# Patient Record
Sex: Male | Born: 1955 | Race: White | Hispanic: No | Marital: Married | Smoking: Former smoker
Health system: Southern US, Community
[De-identification: ages and names within clinical notes are randomized; demographics above are authoritative.]

## PROBLEM LIST (undated history)

## (undated) DIAGNOSIS — N200 Calculus of kidney: Secondary | ICD-10-CM

## (undated) DIAGNOSIS — I10 Essential (primary) hypertension: Secondary | ICD-10-CM

## (undated) DIAGNOSIS — J45909 Unspecified asthma, uncomplicated: Secondary | ICD-10-CM

## (undated) DIAGNOSIS — G4730 Sleep apnea, unspecified: Secondary | ICD-10-CM

## (undated) DIAGNOSIS — L570 Actinic keratosis: Secondary | ICD-10-CM

## (undated) DIAGNOSIS — K8020 Calculus of gallbladder without cholecystitis without obstruction: Secondary | ICD-10-CM

## (undated) DIAGNOSIS — Z87891 Personal history of nicotine dependence: Principal | ICD-10-CM

## (undated) DIAGNOSIS — I2510 Atherosclerotic heart disease of native coronary artery without angina pectoris: Secondary | ICD-10-CM

## (undated) DIAGNOSIS — I700 Atherosclerosis of aorta: Secondary | ICD-10-CM

## (undated) DIAGNOSIS — M1990 Unspecified osteoarthritis, unspecified site: Secondary | ICD-10-CM

## (undated) DIAGNOSIS — J449 Chronic obstructive pulmonary disease, unspecified: Secondary | ICD-10-CM

## (undated) DIAGNOSIS — E785 Hyperlipidemia, unspecified: Secondary | ICD-10-CM

## (undated) DIAGNOSIS — J439 Emphysema, unspecified: Secondary | ICD-10-CM

## (undated) DIAGNOSIS — I5022 Chronic systolic (congestive) heart failure: Secondary | ICD-10-CM

## (undated) DIAGNOSIS — N1831 Chronic kidney disease, stage 3a: Secondary | ICD-10-CM

## (undated) HISTORY — PX: COLON SURGERY: SHX602

## (undated) HISTORY — DX: Actinic keratosis: L57.0

## (undated) HISTORY — DX: Personal history of nicotine dependence: Z87.891

---

## 2007-05-23 ENCOUNTER — Ambulatory Visit: Payer: Self-pay | Admitting: Internal Medicine

## 2007-05-24 ENCOUNTER — Ambulatory Visit: Payer: Self-pay | Admitting: Internal Medicine

## 2009-02-15 ENCOUNTER — Emergency Department: Payer: Self-pay | Admitting: Emergency Medicine

## 2009-05-17 ENCOUNTER — Ambulatory Visit: Payer: Self-pay | Admitting: Gastroenterology

## 2009-08-06 ENCOUNTER — Inpatient Hospital Stay: Payer: Self-pay | Admitting: Surgery

## 2010-06-22 ENCOUNTER — Ambulatory Visit: Payer: Self-pay | Admitting: Internal Medicine

## 2010-07-06 ENCOUNTER — Ambulatory Visit: Payer: Self-pay | Admitting: Internal Medicine

## 2011-09-26 ENCOUNTER — Ambulatory Visit: Payer: Self-pay | Admitting: Gastroenterology

## 2014-11-09 ENCOUNTER — Ambulatory Visit: Admit: 2014-11-09 | Disposition: A | Discharge status: Home / Self Care | Payer: Self-pay | Admitting: Gastroenterology

## 2014-11-16 LAB — SURGICAL PATHOLOGY

## 2015-09-21 ENCOUNTER — Encounter: Payer: Self-pay | Admitting: Family Medicine

## 2015-09-21 ENCOUNTER — Other Ambulatory Visit: Payer: Self-pay | Admitting: Family Medicine

## 2015-09-21 DIAGNOSIS — Z87891 Personal history of nicotine dependence: Secondary | ICD-10-CM

## 2015-09-21 HISTORY — DX: Personal history of nicotine dependence: Z87.891

## 2015-09-24 ENCOUNTER — Inpatient Hospital Stay: Payer: 59 | Attending: Family Medicine | Admitting: Family Medicine

## 2015-09-24 ENCOUNTER — Ambulatory Visit
Admission: RE | Admit: 2015-09-24 | Discharge: 2015-09-24 | Disposition: A | Discharge status: Home / Self Care | Payer: Commercial Managed Care - HMO | Source: Ambulatory Visit | Attending: Family Medicine | Admitting: Family Medicine

## 2015-09-24 ENCOUNTER — Encounter: Payer: Self-pay | Admitting: Family Medicine

## 2015-09-24 DIAGNOSIS — Z87891 Personal history of nicotine dependence: Secondary | ICD-10-CM | POA: Diagnosis not present

## 2015-09-24 DIAGNOSIS — N200 Calculus of kidney: Secondary | ICD-10-CM | POA: Diagnosis not present

## 2015-09-24 DIAGNOSIS — Z122 Encounter for screening for malignant neoplasm of respiratory organs: Secondary | ICD-10-CM | POA: Diagnosis not present

## 2015-09-24 DIAGNOSIS — I2510 Atherosclerotic heart disease of native coronary artery without angina pectoris: Secondary | ICD-10-CM | POA: Diagnosis not present

## 2015-09-24 NOTE — Progress Notes (Signed)
In accordance with CMS guidelines, patient has meet eligibility criteria including age, absence of signs or symptoms of lung cancer, the specific calculation of cigarette smoking pack-years was 44 years and is a current smoker.   A shared decision-making session was conducted prior to the performance of CT scan. This includes one or more decision aids, includes benefits and harms of screening, follow-up diagnostic testing, over-diagnosis, false positive rate, and total radiation exposure.  Counseling on the importance of adherence to annual lung cancer LDCT screening, impact of co-morbidities, and ability or willingness to undergo diagnosis and treatment is imperative for compliance of the program.  Counseling on the importance of continued smoking cessation for former smokers; the importance of smoking cessation for current smokers and information about tobacco cessation interventions have been given to patient including the [place] [facility], 1800 quit Day, as well as Cancer Center specific smoking cessation programs.  Written order for lung cancer screening with LDCT has been given to the patient and any and all questions have been answered to the best of my abilities.   Yearly follow up will be scheduled by Shawn Perkins, Thoracic Navigator.   

## 2015-09-28 ENCOUNTER — Telehealth: Payer: Self-pay | Admitting: *Deleted

## 2015-09-28 NOTE — Telephone Encounter (Signed)
Notified patient of LDCT lung cancer screening results of Lung Rads 1 finding with recommendation for 12 month follow up imaging. Also notified of incidental finding noted below. Patient verbalizes understanding.   IMPRESSION: 1. Lung-RADS Category 1, negative. Continue annual screening with low-dose chest CT without contrast in 12 months. 2. Coronary artery calcification. 3. Right renal stone.

## 2016-08-06 DIAGNOSIS — G4733 Obstructive sleep apnea (adult) (pediatric): Secondary | ICD-10-CM | POA: Diagnosis not present

## 2016-09-01 DIAGNOSIS — R739 Hyperglycemia, unspecified: Secondary | ICD-10-CM | POA: Diagnosis not present

## 2016-09-01 DIAGNOSIS — Z7982 Long term (current) use of aspirin: Secondary | ICD-10-CM | POA: Diagnosis not present

## 2016-09-06 DIAGNOSIS — I2510 Atherosclerotic heart disease of native coronary artery without angina pectoris: Secondary | ICD-10-CM | POA: Diagnosis present

## 2016-09-06 DIAGNOSIS — R739 Hyperglycemia, unspecified: Secondary | ICD-10-CM | POA: Diagnosis not present

## 2016-09-06 DIAGNOSIS — Z0001 Encounter for general adult medical examination with abnormal findings: Secondary | ICD-10-CM | POA: Diagnosis not present

## 2016-09-06 DIAGNOSIS — E784 Other hyperlipidemia: Secondary | ICD-10-CM | POA: Diagnosis not present

## 2016-09-06 DIAGNOSIS — G4733 Obstructive sleep apnea (adult) (pediatric): Secondary | ICD-10-CM | POA: Diagnosis not present

## 2016-09-22 ENCOUNTER — Telehealth: Payer: Self-pay | Admitting: *Deleted

## 2016-09-22 DIAGNOSIS — Z87891 Personal history of nicotine dependence: Secondary | ICD-10-CM

## 2016-09-22 NOTE — Telephone Encounter (Signed)
Notified patient that annual lung cancer screening low dose CT scan is due. Confirmed that patient is within the age range of 55-77, and asymptomatic, (no signs or symptoms of lung cancer). Patient denies illness that would prevent curative treatment for lung cancer if found. The patient is a current smoker, with a 45 pack year history. The shared decision making visit was done 09/24/15 Patient is agreeable for CT scan being scheduled.

## 2016-09-28 ENCOUNTER — Ambulatory Visit
Admission: RE | Admit: 2016-09-28 | Discharge: 2016-09-28 | Disposition: A | Discharge status: Home / Self Care | Payer: 59 | Source: Ambulatory Visit | Attending: Oncology | Admitting: Oncology

## 2016-09-28 DIAGNOSIS — Z122 Encounter for screening for malignant neoplasm of respiratory organs: Secondary | ICD-10-CM | POA: Insufficient documentation

## 2016-09-28 DIAGNOSIS — Z87891 Personal history of nicotine dependence: Secondary | ICD-10-CM | POA: Insufficient documentation

## 2016-09-28 DIAGNOSIS — I700 Atherosclerosis of aorta: Secondary | ICD-10-CM | POA: Diagnosis not present

## 2016-09-28 DIAGNOSIS — J984 Other disorders of lung: Secondary | ICD-10-CM | POA: Insufficient documentation

## 2016-09-28 DIAGNOSIS — J439 Emphysema, unspecified: Secondary | ICD-10-CM | POA: Diagnosis not present

## 2016-09-28 DIAGNOSIS — I2510 Atherosclerotic heart disease of native coronary artery without angina pectoris: Secondary | ICD-10-CM | POA: Diagnosis not present

## 2016-10-04 DIAGNOSIS — G4733 Obstructive sleep apnea (adult) (pediatric): Secondary | ICD-10-CM | POA: Diagnosis not present

## 2016-10-09 ENCOUNTER — Encounter: Payer: Self-pay | Admitting: *Deleted

## 2016-10-12 DIAGNOSIS — I700 Atherosclerosis of aorta: Secondary | ICD-10-CM | POA: Insufficient documentation

## 2016-11-04 DIAGNOSIS — G4733 Obstructive sleep apnea (adult) (pediatric): Secondary | ICD-10-CM | POA: Diagnosis not present

## 2016-12-04 DIAGNOSIS — G4733 Obstructive sleep apnea (adult) (pediatric): Secondary | ICD-10-CM | POA: Diagnosis not present

## 2017-01-04 DIAGNOSIS — G4733 Obstructive sleep apnea (adult) (pediatric): Secondary | ICD-10-CM | POA: Diagnosis not present

## 2017-02-03 DIAGNOSIS — G4733 Obstructive sleep apnea (adult) (pediatric): Secondary | ICD-10-CM | POA: Diagnosis not present

## 2017-02-17 IMAGING — CT CT CHEST LUNG CANCER SCREENING LOW DOSE W/O CM
1 of 2 series · 15 of 40 positions shown, 19 images · non-contrast
Comparison: None.

CLINICAL DATA: Current smoker, 44 pack-year history, lung cancer
screening.

EXAM:
CT CHEST WITHOUT CONTRAST LOW-DOSE FOR LUNG CANCER SCREENING
TECHNIQUE: Multidetector CT imaging of the chest was performed following the
standard protocol without IV contrast.

[Series 2: axial st · axial · 0.77mm/px · z∈[-592,-302]mm · 15 of 64 slices shown, 19 images]
[im 3/64  mediastinal]
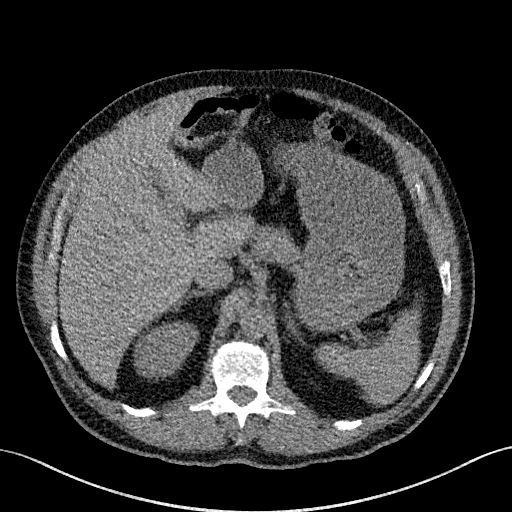
[im 3/64  lung]
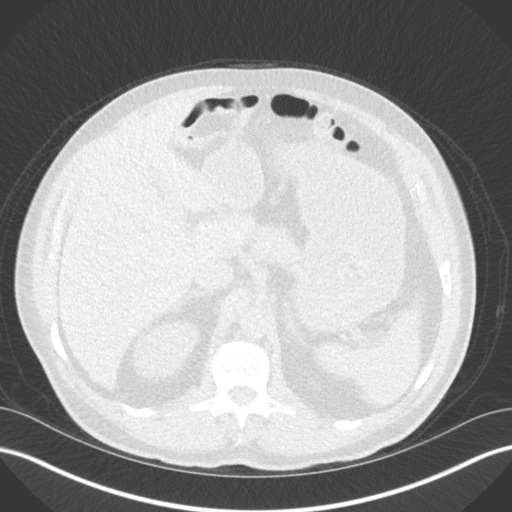
[im 8/64  lung]
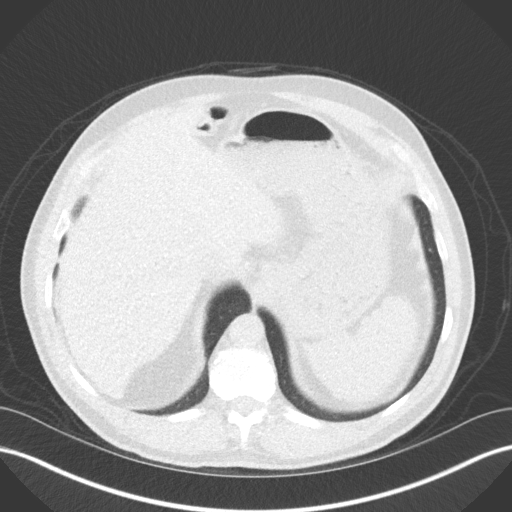
[im 13/64  lung]
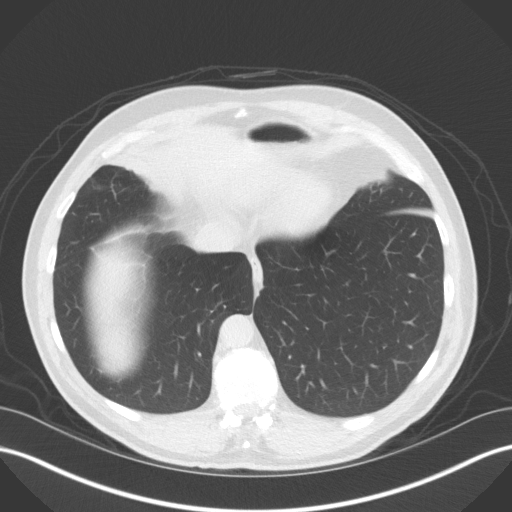
[im 16/64  lung]
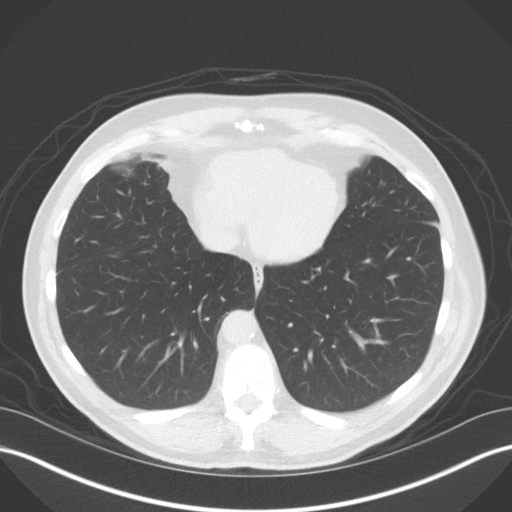
[im 20/64  mediastinal]
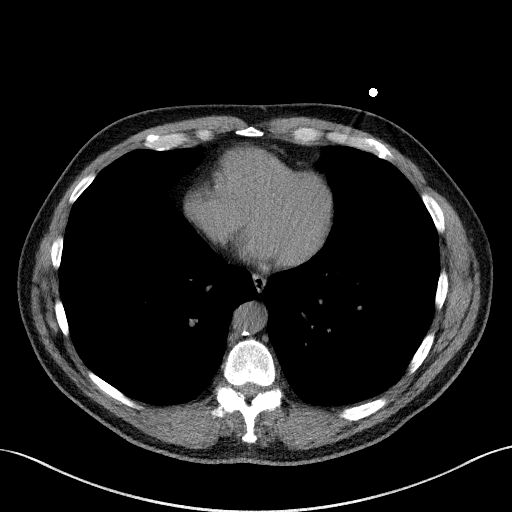
[im 20/64  lung]
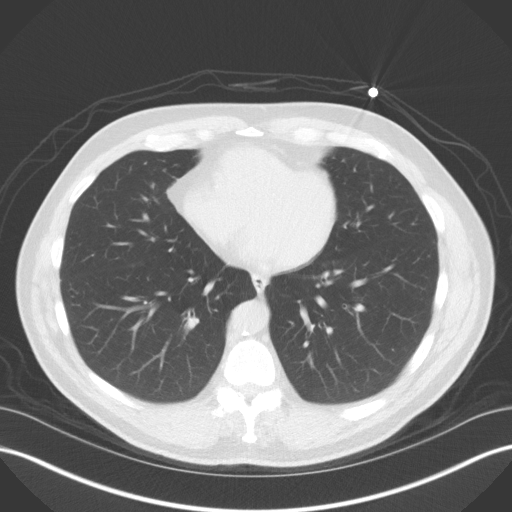
[im 25/64  lung]
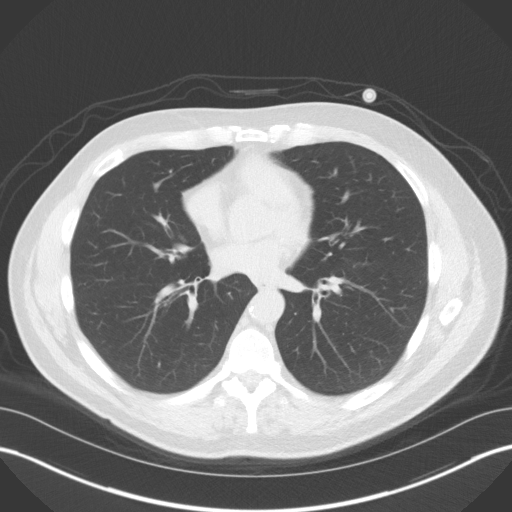
[im 30/64  lung]
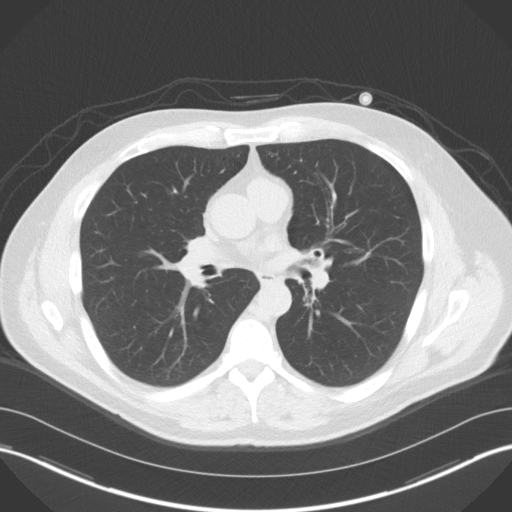
[im 32/64  lung]
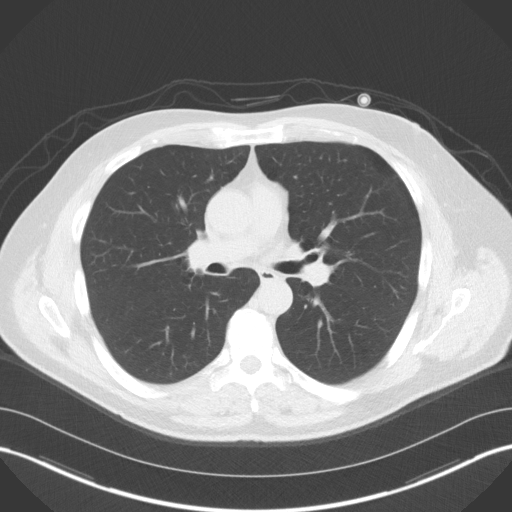
[im 34/64  mediastinal]
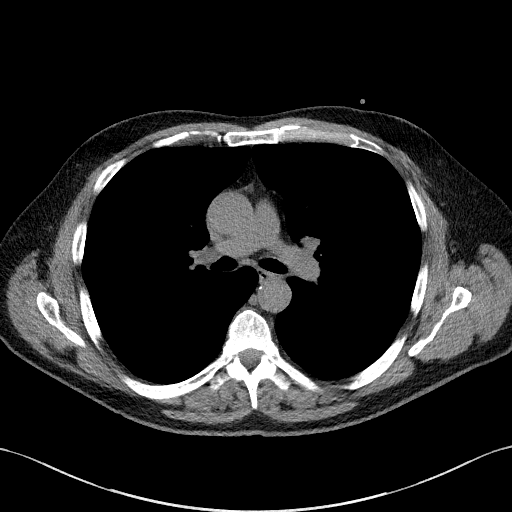
[im 34/64  lung]
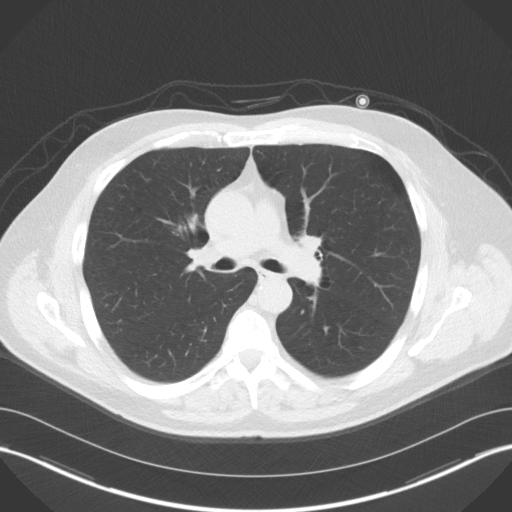
[im 39/64  lung]
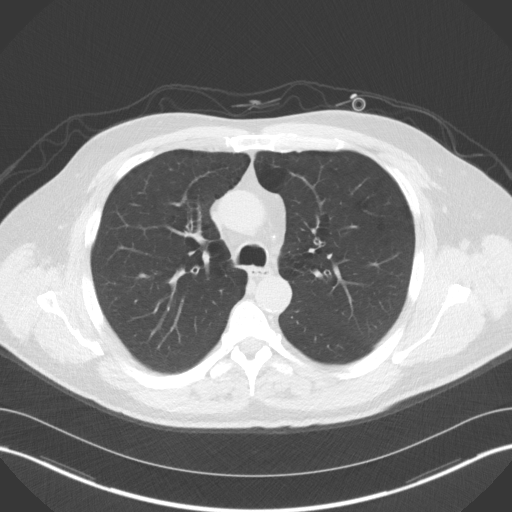
[im 44/64  lung]
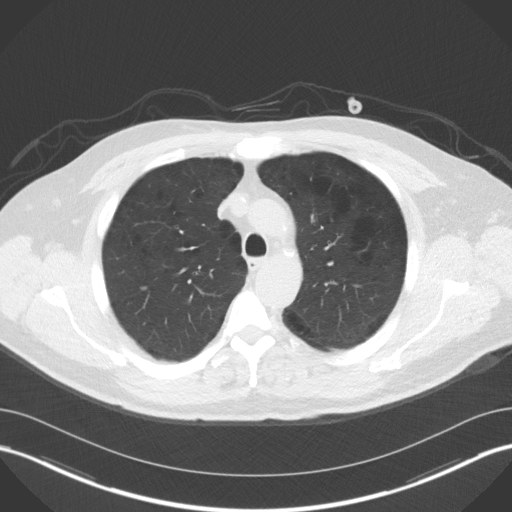
[im 48/64  lung]
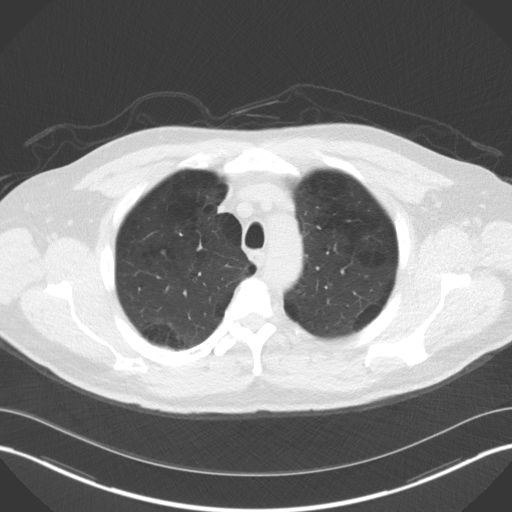
[im 51/64  mediastinal]
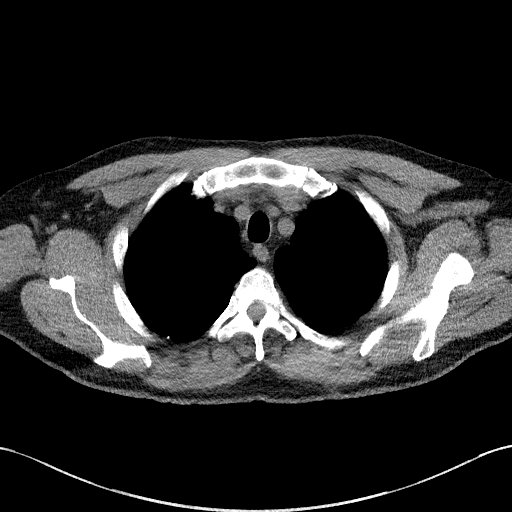
[im 51/64  lung]
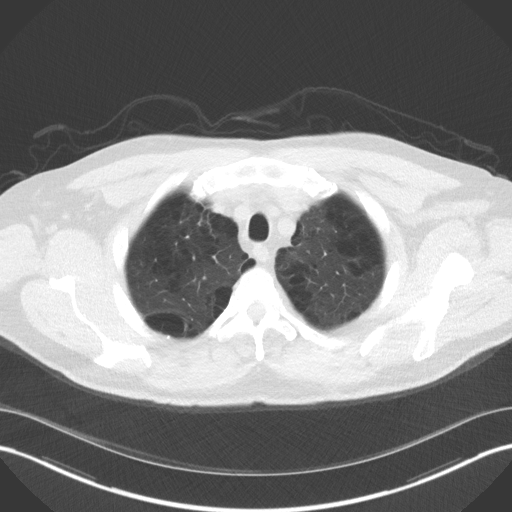
[im 56/64  lung]
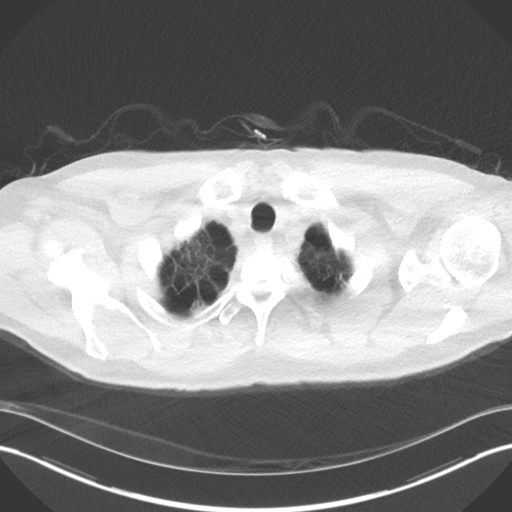
[im 61/64  lung]
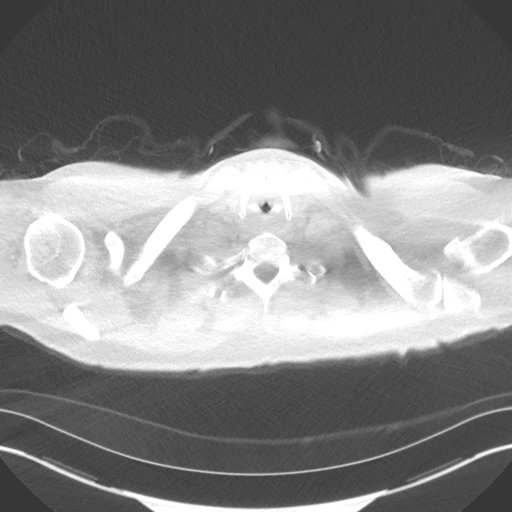

[15 of 40 positions shown; findings below may reference images not displayed]

FINDINGS: Mediastinum/Nodes: No pathologically enlarged mediastinal or
axillary lymph nodes. Hilar regions are difficult to definitively
evaluate without IV contrast but appear grossly unremarkable.
Coronary artery calcification. Heart size normal. No pericardial
effusion.

Lungs/Pleura: Moderate bullous emphysema with mild paraseptal
emphysema. Calcified granuloma in the right upper lobe. Otherwise,
no pulmonary nodules. No pleural fluid. Airway is unremarkable.

Upper abdomen: Visualized portions of the liver and adrenal glands
are unremarkable. Tiny stone in the right kidney. Visualized
portions of the spleen, pancreas and stomach are grossly
unremarkable.

Musculoskeletal: No worrisome lytic or sclerotic lesions. Mild
anterior wedging in the lower thoracic spine with kyphosis, likely
chronic.
IMPRESSION: 1. Lung-RADS Category 1, negative. Continue annual screening with
low-dose chest CT without contrast in 12 months.
2. Coronary artery calcification.
3. Right renal stone.

## 2017-03-06 DIAGNOSIS — G4733 Obstructive sleep apnea (adult) (pediatric): Secondary | ICD-10-CM | POA: Diagnosis not present

## 2017-04-06 DIAGNOSIS — G4733 Obstructive sleep apnea (adult) (pediatric): Secondary | ICD-10-CM | POA: Diagnosis not present

## 2017-05-06 DIAGNOSIS — G4733 Obstructive sleep apnea (adult) (pediatric): Secondary | ICD-10-CM | POA: Diagnosis not present

## 2017-06-06 DIAGNOSIS — G4733 Obstructive sleep apnea (adult) (pediatric): Secondary | ICD-10-CM | POA: Diagnosis not present

## 2017-06-25 DIAGNOSIS — J40 Bronchitis, not specified as acute or chronic: Secondary | ICD-10-CM | POA: Diagnosis not present

## 2017-07-03 DIAGNOSIS — R739 Hyperglycemia, unspecified: Secondary | ICD-10-CM | POA: Diagnosis not present

## 2017-07-03 DIAGNOSIS — I2510 Atherosclerotic heart disease of native coronary artery without angina pectoris: Secondary | ICD-10-CM | POA: Diagnosis not present

## 2017-07-03 DIAGNOSIS — E7849 Other hyperlipidemia: Secondary | ICD-10-CM | POA: Diagnosis not present

## 2017-07-06 DIAGNOSIS — G4733 Obstructive sleep apnea (adult) (pediatric): Secondary | ICD-10-CM | POA: Diagnosis not present

## 2017-08-06 DIAGNOSIS — G4733 Obstructive sleep apnea (adult) (pediatric): Secondary | ICD-10-CM | POA: Diagnosis not present

## 2017-09-06 DIAGNOSIS — G4733 Obstructive sleep apnea (adult) (pediatric): Secondary | ICD-10-CM | POA: Diagnosis not present

## 2017-09-24 ENCOUNTER — Telehealth: Payer: Self-pay | Admitting: *Deleted

## 2017-09-24 DIAGNOSIS — Z122 Encounter for screening for malignant neoplasm of respiratory organs: Secondary | ICD-10-CM

## 2017-09-24 DIAGNOSIS — Z87891 Personal history of nicotine dependence: Secondary | ICD-10-CM

## 2017-09-24 NOTE — Telephone Encounter (Signed)
Notified patient that annual lung cancer screening low dose CT scan is due currently or will be in near future. Confirmed that patient is within the age range of 55-77, and asymptomatic, (no signs or symptoms of lung cancer). Patient denies illness that would prevent curative treatment for lung cancer if found. Verified smoking history, (current, 46 pack year). The shared decision making visit was done 09/24/15. Patient is agreeable for CT scan being scheduled.

## 2017-09-27 DIAGNOSIS — Z125 Encounter for screening for malignant neoplasm of prostate: Secondary | ICD-10-CM | POA: Diagnosis not present

## 2017-09-27 DIAGNOSIS — R739 Hyperglycemia, unspecified: Secondary | ICD-10-CM | POA: Diagnosis not present

## 2017-09-27 DIAGNOSIS — E7849 Other hyperlipidemia: Secondary | ICD-10-CM | POA: Diagnosis not present

## 2017-09-27 DIAGNOSIS — R3129 Other microscopic hematuria: Secondary | ICD-10-CM | POA: Diagnosis not present

## 2017-10-01 ENCOUNTER — Ambulatory Visit: Admission: RE | Admit: 2017-10-01 | Payer: 59 | Source: Ambulatory Visit

## 2017-10-02 ENCOUNTER — Ambulatory Visit
Admission: RE | Admit: 2017-10-02 | Discharge: 2017-10-02 | Disposition: A | Discharge status: Home / Self Care | Payer: 59 | Source: Ambulatory Visit | Attending: Nurse Practitioner | Admitting: Nurse Practitioner

## 2017-10-02 DIAGNOSIS — J432 Centrilobular emphysema: Secondary | ICD-10-CM | POA: Insufficient documentation

## 2017-10-02 DIAGNOSIS — I700 Atherosclerosis of aorta: Secondary | ICD-10-CM | POA: Diagnosis not present

## 2017-10-02 DIAGNOSIS — N200 Calculus of kidney: Secondary | ICD-10-CM | POA: Diagnosis not present

## 2017-10-02 DIAGNOSIS — J984 Other disorders of lung: Secondary | ICD-10-CM | POA: Insufficient documentation

## 2017-10-02 DIAGNOSIS — J438 Other emphysema: Secondary | ICD-10-CM | POA: Insufficient documentation

## 2017-10-02 DIAGNOSIS — Z122 Encounter for screening for malignant neoplasm of respiratory organs: Secondary | ICD-10-CM

## 2017-10-02 DIAGNOSIS — Z87891 Personal history of nicotine dependence: Secondary | ICD-10-CM | POA: Insufficient documentation

## 2017-10-02 DIAGNOSIS — R918 Other nonspecific abnormal finding of lung field: Secondary | ICD-10-CM | POA: Insufficient documentation

## 2017-10-02 DIAGNOSIS — I2510 Atherosclerotic heart disease of native coronary artery without angina pectoris: Secondary | ICD-10-CM | POA: Diagnosis not present

## 2017-10-04 DIAGNOSIS — G4733 Obstructive sleep apnea (adult) (pediatric): Secondary | ICD-10-CM | POA: Diagnosis not present

## 2017-10-04 DIAGNOSIS — I700 Atherosclerosis of aorta: Secondary | ICD-10-CM | POA: Diagnosis not present

## 2017-10-04 DIAGNOSIS — M1992 Post-traumatic osteoarthritis, unspecified site: Secondary | ICD-10-CM | POA: Diagnosis not present

## 2017-10-08 ENCOUNTER — Encounter: Payer: Self-pay | Admitting: *Deleted

## 2017-10-11 DIAGNOSIS — J320 Chronic maxillary sinusitis: Secondary | ICD-10-CM | POA: Diagnosis not present

## 2017-10-11 DIAGNOSIS — J301 Allergic rhinitis due to pollen: Secondary | ICD-10-CM | POA: Diagnosis not present

## 2017-10-11 DIAGNOSIS — J342 Deviated nasal septum: Secondary | ICD-10-CM | POA: Diagnosis not present

## 2017-10-11 DIAGNOSIS — J324 Chronic pansinusitis: Secondary | ICD-10-CM | POA: Diagnosis not present

## 2017-11-04 DIAGNOSIS — G4733 Obstructive sleep apnea (adult) (pediatric): Secondary | ICD-10-CM | POA: Diagnosis not present

## 2017-12-04 DIAGNOSIS — G4733 Obstructive sleep apnea (adult) (pediatric): Secondary | ICD-10-CM | POA: Diagnosis not present

## 2018-01-04 DIAGNOSIS — R739 Hyperglycemia, unspecified: Secondary | ICD-10-CM | POA: Diagnosis not present

## 2018-01-04 DIAGNOSIS — E7849 Other hyperlipidemia: Secondary | ICD-10-CM | POA: Diagnosis not present

## 2018-01-04 DIAGNOSIS — G4733 Obstructive sleep apnea (adult) (pediatric): Secondary | ICD-10-CM | POA: Diagnosis not present

## 2018-01-11 DIAGNOSIS — I700 Atherosclerosis of aorta: Secondary | ICD-10-CM | POA: Diagnosis not present

## 2018-01-11 DIAGNOSIS — I2510 Atherosclerotic heart disease of native coronary artery without angina pectoris: Secondary | ICD-10-CM | POA: Diagnosis not present

## 2018-01-11 DIAGNOSIS — E7849 Other hyperlipidemia: Secondary | ICD-10-CM | POA: Diagnosis not present

## 2018-02-03 DIAGNOSIS — G4733 Obstructive sleep apnea (adult) (pediatric): Secondary | ICD-10-CM | POA: Diagnosis not present

## 2018-03-06 DIAGNOSIS — G4733 Obstructive sleep apnea (adult) (pediatric): Secondary | ICD-10-CM | POA: Diagnosis not present

## 2018-04-06 DIAGNOSIS — G4733 Obstructive sleep apnea (adult) (pediatric): Secondary | ICD-10-CM | POA: Diagnosis not present

## 2018-05-06 DIAGNOSIS — G4733 Obstructive sleep apnea (adult) (pediatric): Secondary | ICD-10-CM | POA: Diagnosis not present

## 2018-06-06 DIAGNOSIS — G4733 Obstructive sleep apnea (adult) (pediatric): Secondary | ICD-10-CM | POA: Diagnosis not present

## 2018-07-06 DIAGNOSIS — G4733 Obstructive sleep apnea (adult) (pediatric): Secondary | ICD-10-CM | POA: Diagnosis not present

## 2018-07-08 DIAGNOSIS — E7849 Other hyperlipidemia: Secondary | ICD-10-CM | POA: Diagnosis not present

## 2018-07-08 DIAGNOSIS — R3129 Other microscopic hematuria: Secondary | ICD-10-CM | POA: Diagnosis not present

## 2018-07-08 DIAGNOSIS — R739 Hyperglycemia, unspecified: Secondary | ICD-10-CM | POA: Diagnosis not present

## 2018-07-08 DIAGNOSIS — I2510 Atherosclerotic heart disease of native coronary artery without angina pectoris: Secondary | ICD-10-CM | POA: Diagnosis not present

## 2018-07-31 DIAGNOSIS — G4733 Obstructive sleep apnea (adult) (pediatric): Secondary | ICD-10-CM | POA: Diagnosis not present

## 2018-07-31 DIAGNOSIS — M19071 Primary osteoarthritis, right ankle and foot: Secondary | ICD-10-CM | POA: Diagnosis not present

## 2018-07-31 DIAGNOSIS — I700 Atherosclerosis of aorta: Secondary | ICD-10-CM | POA: Diagnosis not present

## 2018-07-31 DIAGNOSIS — E7849 Other hyperlipidemia: Secondary | ICD-10-CM | POA: Diagnosis not present

## 2018-07-31 DIAGNOSIS — M79671 Pain in right foot: Secondary | ICD-10-CM | POA: Diagnosis not present

## 2018-08-06 DIAGNOSIS — G4733 Obstructive sleep apnea (adult) (pediatric): Secondary | ICD-10-CM | POA: Diagnosis not present

## 2018-08-16 DIAGNOSIS — K219 Gastro-esophageal reflux disease without esophagitis: Secondary | ICD-10-CM | POA: Diagnosis not present

## 2018-08-16 DIAGNOSIS — H6121 Impacted cerumen, right ear: Secondary | ICD-10-CM | POA: Diagnosis not present

## 2018-08-16 DIAGNOSIS — J301 Allergic rhinitis due to pollen: Secondary | ICD-10-CM | POA: Diagnosis not present

## 2018-08-16 DIAGNOSIS — H6062 Unspecified chronic otitis externa, left ear: Secondary | ICD-10-CM | POA: Diagnosis not present

## 2018-08-20 DIAGNOSIS — J320 Chronic maxillary sinusitis: Secondary | ICD-10-CM | POA: Diagnosis not present

## 2018-09-06 DIAGNOSIS — G4733 Obstructive sleep apnea (adult) (pediatric): Secondary | ICD-10-CM | POA: Diagnosis not present

## 2018-09-21 ENCOUNTER — Telehealth: Payer: Self-pay

## 2018-09-21 NOTE — Telephone Encounter (Signed)
Call pt regarding lung screening. Pt is a current smoker. Pt is unable to tell me how much he smokes. Pt states depends on how mad his girlfriend is depends on how much he will smoke that day. Scan can be any day and time and he denies any new health issues.

## 2018-09-23 ENCOUNTER — Telehealth: Payer: Self-pay | Admitting: *Deleted

## 2018-09-23 ENCOUNTER — Encounter: Payer: Self-pay | Admitting: *Deleted

## 2018-09-23 DIAGNOSIS — Z122 Encounter for screening for malignant neoplasm of respiratory organs: Secondary | ICD-10-CM

## 2018-09-23 NOTE — Telephone Encounter (Signed)
Patient has been notified that the annual lung cancer screening low dose CT scan is due currently or will be in the near future.  Confirmed that the patient is within the age range of 50-80, and asymptomatic, and currently exhibits no signs or symptoms of lung cancer.  Patient denies illness that would prevent curative treatment for lung cancer if found.  Verified smoking history, current smoker 1 ppd with 47pkyr hx .  The shared decision making visit was completed on 09-24-15.  Patient is agreeable for the CT scan to be scheduled.  Will call patient back with date and time of appointment.

## 2018-09-24 ENCOUNTER — Telehealth: Payer: Self-pay | Admitting: *Deleted

## 2018-09-24 DIAGNOSIS — J320 Chronic maxillary sinusitis: Secondary | ICD-10-CM | POA: Diagnosis not present

## 2018-09-24 DIAGNOSIS — R05 Cough: Secondary | ICD-10-CM | POA: Diagnosis not present

## 2018-09-24 NOTE — Telephone Encounter (Signed)
Called pt to inform him of his appt for ldct screening on 10/04/2018 @ 10:15am here @ [facility], message left for pt, appt mailed.

## 2018-10-04 ENCOUNTER — Other Ambulatory Visit: Payer: Self-pay

## 2018-10-04 ENCOUNTER — Ambulatory Visit
Admission: RE | Admit: 2018-10-04 | Discharge: 2018-10-04 | Disposition: A | Discharge status: Home / Self Care | Payer: 59 | Source: Ambulatory Visit | Attending: Nurse Practitioner | Admitting: Nurse Practitioner

## 2018-10-04 DIAGNOSIS — F17210 Nicotine dependence, cigarettes, uncomplicated: Secondary | ICD-10-CM | POA: Diagnosis not present

## 2018-10-04 DIAGNOSIS — Z122 Encounter for screening for malignant neoplasm of respiratory organs: Secondary | ICD-10-CM | POA: Diagnosis not present

## 2018-10-05 DIAGNOSIS — G4733 Obstructive sleep apnea (adult) (pediatric): Secondary | ICD-10-CM | POA: Diagnosis not present

## 2018-10-09 ENCOUNTER — Encounter: Payer: Self-pay | Admitting: *Deleted

## 2018-11-05 DIAGNOSIS — G4733 Obstructive sleep apnea (adult) (pediatric): Secondary | ICD-10-CM | POA: Diagnosis not present

## 2018-11-11 DIAGNOSIS — R739 Hyperglycemia, unspecified: Secondary | ICD-10-CM | POA: Diagnosis not present

## 2018-11-11 DIAGNOSIS — I700 Atherosclerosis of aorta: Secondary | ICD-10-CM | POA: Diagnosis not present

## 2018-11-11 DIAGNOSIS — R3129 Other microscopic hematuria: Secondary | ICD-10-CM | POA: Diagnosis not present

## 2018-11-11 DIAGNOSIS — E7849 Other hyperlipidemia: Secondary | ICD-10-CM | POA: Diagnosis not present

## 2018-11-11 DIAGNOSIS — Z125 Encounter for screening for malignant neoplasm of prostate: Secondary | ICD-10-CM | POA: Diagnosis not present

## 2018-11-29 DIAGNOSIS — Z0000 Encounter for general adult medical examination without abnormal findings: Secondary | ICD-10-CM | POA: Diagnosis not present

## 2018-11-29 DIAGNOSIS — R739 Hyperglycemia, unspecified: Secondary | ICD-10-CM | POA: Diagnosis not present

## 2018-11-29 DIAGNOSIS — E7849 Other hyperlipidemia: Secondary | ICD-10-CM | POA: Diagnosis not present

## 2018-12-05 DIAGNOSIS — G4733 Obstructive sleep apnea (adult) (pediatric): Secondary | ICD-10-CM | POA: Diagnosis not present

## 2019-06-10 ENCOUNTER — Other Ambulatory Visit: Payer: Self-pay | Admitting: Internal Medicine

## 2019-06-10 DIAGNOSIS — R3129 Other microscopic hematuria: Secondary | ICD-10-CM

## 2019-06-17 ENCOUNTER — Ambulatory Visit: Payer: 59 | Attending: Internal Medicine

## 2019-07-15 ENCOUNTER — Ambulatory Visit: Payer: Self-pay | Admitting: Urology

## 2019-08-05 ENCOUNTER — Ambulatory Visit: Payer: Self-pay | Admitting: Urology

## 2019-08-06 ENCOUNTER — Encounter: Payer: Self-pay | Admitting: Urology

## 2019-10-03 ENCOUNTER — Telehealth: Payer: Self-pay | Admitting: *Deleted

## 2019-10-03 DIAGNOSIS — Z87891 Personal history of nicotine dependence: Secondary | ICD-10-CM

## 2019-10-03 NOTE — Telephone Encounter (Signed)
(  10/03/19) Pt has been notified that lung cancer screening CT scan is due currently or will be in near future. Confirmed pt is within appropriate age range, and asymptomatic. Pt denies illness that would prevent curative treatment for lung cancer if found. Verified smoking history (Current Smoker,0.75 ppd ). Pt is agreeable for CT scan being scheduled, no time or day preference  SRW

## 2019-10-08 NOTE — Addendum Note (Signed)
Addended by: Jonne Ply on: 10/08/2019 12:39 PM   Modules accepted: Orders

## 2019-10-08 NOTE — Telephone Encounter (Signed)
Smoking history: current, 47 pack year 

## 2019-10-13 ENCOUNTER — Ambulatory Visit: Admission: RE | Admit: 2019-10-13 | Payer: 59 | Source: Ambulatory Visit

## 2019-10-14 ENCOUNTER — Other Ambulatory Visit: Payer: Self-pay

## 2019-10-14 ENCOUNTER — Ambulatory Visit
Admission: RE | Admit: 2019-10-14 | Discharge: 2019-10-14 | Disposition: A | Discharge status: Home / Self Care | Payer: 59 | Source: Ambulatory Visit | Attending: Oncology | Admitting: Oncology

## 2019-10-14 DIAGNOSIS — Z87891 Personal history of nicotine dependence: Secondary | ICD-10-CM

## 2019-10-16 ENCOUNTER — Encounter: Payer: Self-pay | Admitting: *Deleted

## 2019-10-20 ENCOUNTER — Ambulatory Visit: Payer: 59 | Attending: Internal Medicine

## 2019-10-20 DIAGNOSIS — Z23 Encounter for immunization: Secondary | ICD-10-CM

## 2019-10-20 NOTE — Progress Notes (Signed)
Covid-19 Vaccination Clinic  Name:  Roger Black    MRN: [id number] DOB: 07/31/1955  10/20/2019  Roger Black was observed post Covid-19 immunization for 15 minutes without incident. He was provided with Vaccine Information Sheet and instruction to access the V-Safe system.   Roger Black was instructed to call 911 with any severe reactions post vaccine: Marland Kitchen Difficulty breathing  . Swelling of face and throat  . A fast heartbeat  . A bad rash all over body  . Dizziness and weakness   Immunizations Administered    Name Date Dose VIS Date Route   Pfizer COVID-19 Vaccine 10/20/2019  3:41 PM 0.3 mL 07/04/2019 Intramuscular   Manufacturer: [employer], [employer]   Lot: [id number]   NDC: [address]-7

## 2019-11-10 ENCOUNTER — Ambulatory Visit: Payer: 59 | Attending: Internal Medicine

## 2019-11-10 DIAGNOSIS — Z23 Encounter for immunization: Secondary | ICD-10-CM

## 2019-11-10 NOTE — Progress Notes (Signed)
Covid-19 Vaccination Clinic  Name:  Roger Black    MRN: [id number] DOB: 06/04/1956  11/10/2019  Roger Black was observed post Covid-19 immunization for 15 minutes without incident. He was provided with Vaccine Information Sheet and instruction to access the V-Safe system.   Roger Black was instructed to call 911 with any severe reactions post vaccine: Marland Kitchen Difficulty breathing  . Swelling of face and throat  . A fast heartbeat  . A bad rash all over body  . Dizziness and weakness   Immunizations Administered    Name Date Dose VIS Date Route   Pfizer COVID-19 Vaccine 11/10/2019  3:28 PM 0.3 mL 09/17/2018 Intramuscular   Manufacturer: [employer], [employer]   Lot: [id number]   NDC: [address]-9

## 2020-01-15 ENCOUNTER — Ambulatory Visit: Payer: 59 | Admitting: Dermatology

## 2020-06-23 ENCOUNTER — Encounter: Payer: Self-pay | Admitting: Dermatology

## 2020-06-23 ENCOUNTER — Other Ambulatory Visit: Payer: Self-pay

## 2020-06-23 ENCOUNTER — Ambulatory Visit: Payer: 59 | Admitting: Dermatology

## 2020-06-23 DIAGNOSIS — L578 Other skin changes due to chronic exposure to nonionizing radiation: Secondary | ICD-10-CM | POA: Diagnosis not present

## 2020-06-23 DIAGNOSIS — B070 Plantar wart: Secondary | ICD-10-CM | POA: Diagnosis not present

## 2020-06-23 DIAGNOSIS — L570 Actinic keratosis: Secondary | ICD-10-CM | POA: Diagnosis not present

## 2020-06-23 DIAGNOSIS — A630 Anogenital (venereal) warts: Secondary | ICD-10-CM | POA: Diagnosis not present

## 2020-06-23 NOTE — Progress Notes (Signed)
° °  Follow-Up Visit   Subjective  Roger Black is a 64 y.o. male who presents for the following: growth (groin, present x 6 mos, growing. Hx of condyloma.). He also has a few spots to check on his hands that are itchy and get irritated. He stepped on a piece of glass possibly about a month ago. He thought he removed it, but area is still tender when walking at times.   The following portions of the chart were reviewed this encounter and updated as appropriate:      Review of Systems:  No other skin or systemic complaints except as noted in HPI or Assessment and Plan.  Objective  Well appearing patient in no apparent distress; mood and affect are within normal limits.  A focused examination was performed including face, neck, chest and back and face, hands, groin, foot. Relevant physical exam findings are noted in the Assessment and Plan.  Objective  L inguinal groin: 2.0cm pink verrucous plaque  Objective  R hand dorsum x 1, R wrist x 1, L hand dorsum x 4: Keratotic papules  Objective  R plantar foot at ball: 3.53mm firm flesh papule   Assessment & Plan   Actinic Damage - chronic, secondary to cumulative UV radiation exposure/sun exposure over time - diffuse scaly erythematous macules with underlying dyspigmentation - Recommend daily broad spectrum sunscreen SPF 30+ to sun-exposed areas, reapply every 2 hours as needed.  - Call for new or changing lesions.  Condyloma L inguinal groin  Destruction of lesion - L inguinal groin  Destruction method: cryotherapy   Informed consent: discussed and consent obtained   Lesion destroyed using liquid nitrogen: Yes   Region frozen until ice ball extended beyond lesion: Yes   Outcome: patient tolerated procedure well with no complications   Post-procedure details: wound care instructions given    Hypertrophic actinic keratosis R hand dorsum x 1, R wrist x 1, L hand dorsum x 4  Destruction of lesion - R hand dorsum x 1, R wrist x  1, L hand dorsum x 4  Destruction method: cryotherapy   Informed consent: discussed and consent obtained   Lesion destroyed using liquid nitrogen: Yes   Region frozen until ice ball extended beyond lesion: Yes   Outcome: patient tolerated procedure well with no complications   Post-procedure details: wound care instructions given    Plantar wart R plantar foot at ball  vs Foreign Body  Destruction of lesion - R plantar foot at ball  Destruction method: cryotherapy   Informed consent: discussed and consent obtained   Lesion destroyed using liquid nitrogen: Yes   Region frozen until ice ball extended beyond lesion: Yes   Outcome: patient tolerated procedure well with no complications   Post-procedure details: wound care instructions given    Return in about 6 weeks (around 08/04/2020) for warts.   ICherlyn Labella, CMA, am acting as scribe for Willeen Niece, MD .  Documentation: I have reviewed the above documentation for accuracy and completeness, and I agree with the above.  Willeen Niece MD

## 2020-06-23 NOTE — Patient Instructions (Signed)
Cryotherapy Aftercare  . Wash gently with soap and water everyday.   . Apply Vaseline and Band-Aid daily until healed.  

## 2020-08-11 ENCOUNTER — Ambulatory Visit: Payer: 59 | Admitting: Dermatology

## 2020-09-02 DIAGNOSIS — Z86010 Personal history of colonic polyps: Secondary | ICD-10-CM | POA: Diagnosis not present

## 2020-09-29 ENCOUNTER — Telehealth: Payer: Self-pay

## 2020-09-30 ENCOUNTER — Telehealth: Payer: Self-pay

## 2020-09-30 NOTE — Telephone Encounter (Signed)
Patient contacted and scheduled for lung screening CT scan (10/27/20 @ 11:00 am). Pt states he is currently smoking 3/4 ppd. Has had recent insurance changes to Medicare.

## 2020-10-01 ENCOUNTER — Other Ambulatory Visit: Payer: Self-pay | Admitting: *Deleted

## 2020-10-01 DIAGNOSIS — Z87891 Personal history of nicotine dependence: Secondary | ICD-10-CM

## 2020-10-01 DIAGNOSIS — F17200 Nicotine dependence, unspecified, uncomplicated: Secondary | ICD-10-CM

## 2020-10-01 DIAGNOSIS — Z122 Encounter for screening for malignant neoplasm of respiratory organs: Secondary | ICD-10-CM

## 2020-10-01 NOTE — Progress Notes (Signed)
Contacted and scheduled for annual lung screening scan. Patient is a current smoker with a 47.75 pack year history.

## 2020-10-18 ENCOUNTER — Other Ambulatory Visit
Admission: RE | Admit: 2020-10-18 | Discharge: 2020-10-18 | Disposition: A | Discharge status: Home / Self Care | Payer: PPO | Source: Ambulatory Visit | Attending: Internal Medicine | Admitting: Internal Medicine

## 2020-10-18 ENCOUNTER — Other Ambulatory Visit: Payer: Self-pay

## 2020-10-18 DIAGNOSIS — Z20822 Contact with and (suspected) exposure to covid-19: Secondary | ICD-10-CM | POA: Insufficient documentation

## 2020-10-18 DIAGNOSIS — Z01812 Encounter for preprocedural laboratory examination: Secondary | ICD-10-CM | POA: Insufficient documentation

## 2020-10-19 ENCOUNTER — Encounter: Payer: Self-pay | Admitting: Internal Medicine

## 2020-10-19 LAB — SARS CORONAVIRUS 2 (TAT 6-24 HRS): SARS Coronavirus 2: NEGATIVE

## 2020-10-20 ENCOUNTER — Ambulatory Visit: Payer: PPO | Admitting: Anesthesiology

## 2020-10-20 ENCOUNTER — Encounter
Admission: RE | Disposition: A | Discharge status: Home / Self Care | Payer: Self-pay | Source: Home / Self Care | Attending: Internal Medicine

## 2020-10-20 ENCOUNTER — Encounter: Payer: Self-pay | Admitting: Internal Medicine

## 2020-10-20 ENCOUNTER — Ambulatory Visit
Admission: RE | Admit: 2020-10-20 | Discharge: 2020-10-20 | Disposition: A | Discharge status: Home / Self Care | Payer: PPO | Attending: Internal Medicine | Admitting: Internal Medicine

## 2020-10-20 DIAGNOSIS — Z1211 Encounter for screening for malignant neoplasm of colon: Secondary | ICD-10-CM | POA: Diagnosis not present

## 2020-10-20 DIAGNOSIS — Z980 Intestinal bypass and anastomosis status: Secondary | ICD-10-CM | POA: Insufficient documentation

## 2020-10-20 DIAGNOSIS — Z79899 Other long term (current) drug therapy: Secondary | ICD-10-CM | POA: Diagnosis not present

## 2020-10-20 DIAGNOSIS — K5730 Diverticulosis of large intestine without perforation or abscess without bleeding: Secondary | ICD-10-CM | POA: Diagnosis not present

## 2020-10-20 DIAGNOSIS — Z86010 Personal history of colonic polyps: Secondary | ICD-10-CM | POA: Diagnosis not present

## 2020-10-20 HISTORY — DX: Sleep apnea, unspecified: G47.30

## 2020-10-20 HISTORY — DX: Hyperlipidemia, unspecified: E78.5

## 2020-10-20 HISTORY — DX: Atherosclerotic heart disease of native coronary artery without angina pectoris: I25.10

## 2020-10-20 HISTORY — DX: Unspecified osteoarthritis, unspecified site: M19.90

## 2020-10-20 HISTORY — PX: COLONOSCOPY WITH PROPOFOL: SHX5780

## 2020-10-20 HISTORY — DX: Unspecified asthma, uncomplicated: J45.909

## 2020-10-20 HISTORY — DX: Atherosclerosis of aorta: I70.0

## 2020-10-20 HISTORY — DX: Chronic obstructive pulmonary disease, unspecified: J44.9

## 2020-10-20 SURGERY — COLONOSCOPY WITH PROPOFOL
Anesthesia: General

## 2020-10-20 MED ORDER — PROPOFOL 500 MG/50ML IV EMUL
INTRAVENOUS | Status: DC | PRN
  Administered 2020-10-20: 200 ug/kg/min via INTRAVENOUS

## 2020-10-20 MED ORDER — PROPOFOL 10 MG/ML IV BOLUS
INTRAVENOUS | Status: DC | PRN
  Administered 2020-10-20: 60 mg via INTRAVENOUS

## 2020-10-20 MED ORDER — SODIUM CHLORIDE 0.9 % IV SOLN: INTRAVENOUS | Status: DC

## 2020-10-20 NOTE — Transfer of Care (Signed)
Immediate Anesthesia Transfer of Care Note  Patient: Roger Black  Procedure(s) Performed: COLONOSCOPY WITH PROPOFOL (N/A )  Patient Location: PACU  Anesthesia Type:General  Level of Consciousness: sedated  Airway & Oxygen Therapy: Patient Spontanous Breathing and Patient connected to nasal cannula oxygen  Post-op Assessment: Report given to RN and Post -op Vital signs reviewed and stable  Post vital signs: Reviewed and stable  Last Vitals:  Vitals Value Taken Time  BP 104/67 10/20/20 1024  Temp    Pulse 106 10/20/20 1025  Resp 25 10/20/20 1025  SpO2 94 % 10/20/20 1025  Vitals shown include unvalidated device data.  Last Pain:  Vitals:   10/20/20 0848  TempSrc: Temporal  PainSc: 0-No pain         Complications: No complications documented.

## 2020-10-20 NOTE — Op Note (Signed)
[facility] Gastroenterology Patient Name: Roger Black Procedure Date: 10/20/2020 10:00 AM MRN: [id number] Account #: [id number] Date of Birth: 02-Oct-1955 Admit Type: Outpatient Age: 65 Room: [facility] ENDO ROOM 2 Gender: Male Note Status: Finalized Procedure:             Colonoscopy Indications:           Surveillance: Personal history of adenomatous polyps                         on last colonoscopy > 5 years ago Providers:             Royce Macadamia K. Norma Fredrickson MD, MD Referring MD:          Daniel Nones, MD (Referring MD) Medicines:             Propofol per Anesthesia Complications:         No immediate complications. Procedure:             Pre-Anesthesia Assessment:                        - The risks and benefits of the procedure and the                         sedation options and risks were discussed with the                         patient. All questions were answered and informed                         consent was obtained.                        - Patient identification and proposed procedure were                         verified prior to the procedure by the nurse. The                         procedure was verified in the procedure room.                        - ASA Grade Assessment: III - A patient with severe                         systemic disease.                        - After reviewing the risks and benefits, the patient                         was deemed in satisfactory condition to undergo the                         procedure.                        After obtaining informed consent, the colonoscope was                         passed under direct vision.  Throughout the procedure,                         the patient's blood pressure, pulse, and oxygen                         saturations were monitored continuously. The                         Colonoscope was introduced through the anus and                         advanced to the the cecum, identified by  appendiceal                         orifice and ileocecal valve. The colonoscopy was                         performed without difficulty. The patient tolerated                         the procedure well. The quality of the bowel                         preparation was good. The ileocecal valve, appendiceal                         orifice, and rectum were photographed. Findings:      The perianal and digital rectal examinations were normal. Pertinent       negatives include normal sphincter tone and no palpable rectal lesions.      There was evidence of a prior end-to-end colo-colonic anastomosis in the       sigmoid colon. This was patent and was characterized by healthy       appearing mucosa. The anastomosis was traversed.      A few small-mouthed diverticula were found in the distal sigmoid colon.      The exam was otherwise without abnormality. Impression:            - Patent end-to-end colo-colonic anastomosis,                         characterized by healthy appearing mucosa.                        - Diverticulosis in the distal sigmoid colon.                        - The examination was otherwise normal.                        - No specimens collected. Recommendation:        - Patient has a contact number available for                         emergencies. The signs and symptoms of potential                         delayed complications were discussed with the patient.  Return to normal activities tomorrow. Written                         discharge instructions were provided to the patient.                        - Resume previous diet.                        - Continue present medications.                        - Repeat colonoscopy in 5 years for surveillance.                        - Return to GI office PRN.                        - The findings and recommendations were discussed with                         the patient. Procedure Code(s):     ---  Professional ---                        H8469, Colorectal cancer screening; colonoscopy on                         individual at high risk Diagnosis Code(s):     --- Professional ---                        K57.30, Diverticulosis of large intestine without                         perforation or abscess without bleeding                        Z98.0, Intestinal bypass and anastomosis status                        Z86.010, Personal history of colonic polyps CPT copyright 2019 American Medical Association. All rights reserved. The codes documented in this report are preliminary and upon coder review may  be revised to meet current compliance requirements. Stanton Kidney MD, MD 10/20/2020 10:25:59 AM This report has been signed electronically. Number of Addenda: 0 Note Initiated On: 10/20/2020 10:00 AM Scope Withdrawal Time: 0 hours 5 minutes 38 seconds  Total Procedure Duration: 0 hours 8 minutes 8 seconds  Estimated Blood Loss:  Estimated blood loss: none.      [facility]

## 2020-10-20 NOTE — Anesthesia Preprocedure Evaluation (Signed)
Anesthesia Evaluation  Patient identified by MRN, date of birth, ID band Patient awake    Reviewed: Allergy & Precautions, NPO status , Patient's Chart, lab work & pertinent test results  Airway Mallampati: II  TM Distance: >3 FB     Dental   Pulmonary asthma , sleep apnea , COPD,  COPD inhaler, Current Smoker,    Pulmonary exam normal        Cardiovascular + CAD  Normal cardiovascular exam     Neuro/Psych negative neurological ROS  negative psych ROS   GI/Hepatic negative GI ROS, Neg liver ROS,   Endo/Other  negative endocrine ROS  Renal/GU negative Renal ROS  negative genitourinary   Musculoskeletal  (+) Arthritis , Osteoarthritis,    Abdominal Normal abdominal exam  (+)   Peds negative pediatric ROS (+)  Hematology negative hematology ROS (+)   Anesthesia Other Findings Past Medical History: No date: Actinic keratosis No date: Aortic atherosclerosis (HCC) No date: Arthritis No date: Asthma No date: COPD (chronic obstructive pulmonary disease) (HCC) No date: Coronary artery disease No date: Hyperlipidemia 09/21/2015: Personal history of tobacco use, presenting hazards to  health No date: Sleep apnea  Reproductive/Obstetrics                             Anesthesia Physical Anesthesia Plan  ASA: III  Anesthesia Plan: General   Post-op Pain Management:    Induction: Intravenous  PONV Risk Score and Plan: Propofol infusion  Airway Management Planned: Nasal Cannula  Additional Equipment:   Intra-op Plan:   Post-operative Plan:   Informed Consent: I have reviewed the patients History and Physical, chart, labs and discussed the procedure including the risks, benefits and alternatives for the proposed anesthesia with the patient or authorized representative who has indicated his/her understanding and acceptance.     Dental advisory given  Plan Discussed with: CRNA and  Surgeon  Anesthesia Plan Comments:         Anesthesia Quick Evaluation

## 2020-10-20 NOTE — Anesthesia Procedure Notes (Signed)
Date/Time: 10/20/2020 10:10 AM Performed by: Junious Silk, CRNA Pre-anesthesia Checklist: Patient identified, Emergency Drugs available, Suction available, Patient being monitored and Timeout performed Oxygen Delivery Method: Nasal cannula

## 2020-10-20 NOTE — Interval H&P Note (Signed)
History and Physical Interval Note:  10/20/2020 9:15 AM  Roger Black  has presented today for surgery, with the diagnosis of P H POLYPS.  The various methods of treatment have been discussed with the patient and family. After consideration of risks, benefits and other options for treatment, the patient has consented to  Procedure(s): COLONOSCOPY WITH PROPOFOL (N/A) as a surgical intervention.  The patient's history has been reviewed, patient examined, no change in status, stable for surgery.  I have reviewed the patient's chart and labs.  Questions were answered to the patient's satisfaction.     [place], [place]

## 2020-10-20 NOTE — H&P (Signed)
Outpatient short stay form Pre-procedure 10/20/2020 9:14 AM Malaijah Houchen K. Alice Reichert, M.D.  Primary Physician: Ramonita Lab III, M.D.  Reason for visit:  Personal history of adenomatous colon polyps - 11/09/2014  History of present illness:                           Patient presents for colonoscopy for a personal hx of colon polyps. The patient denies abdominal pain, abnormal weight loss or rectal bleeding.      Current Facility-Administered Medications:  .  0.9 %  sodium chloride infusion, , Intravenous, Continuous, [place], Benay Pike, MD, Last Rate: 20 mL/hr at 10/20/20 0905, New Bag at 10/20/20 0905  Medications Prior to Admission  Medication Sig Dispense Refill Last Dose  . albuterol (VENTOLIN HFA) 108 (90 Base) MCG/ACT inhaler Inhale into the lungs.   Past Week at Unknown time  . aspirin 81 MG EC tablet Take by mouth.   Past Week at Unknown time  . budesonide-formoterol (SYMBICORT) 160-4.5 MCG/ACT inhaler Inhale 2 puffs into the lungs 2 (two) times daily.   Past Week at Unknown time  . levocetirizine (XYZAL) 5 MG tablet Take 5 mg by mouth at bedtime.   Past Week at Unknown time  . montelukast (SINGULAIR) 10 MG tablet Take 10 mg by mouth daily.   Past Week at Unknown time  . Na Sulfate-K Sulfate-Mg Sulf (SUPREP BOWEL PREP KIT PO) Take by mouth once.   10/20/2020 at Unknown time  . pantoprazole (PROTONIX) 40 MG tablet Take by mouth.   Past Week at Unknown time  . atorvastatin (LIPITOR) 80 MG tablet Take by mouth.        No Known Allergies   Past Medical History:  Diagnosis Date  . Actinic keratosis   . Aortic atherosclerosis ([place])   . Arthritis   . Asthma   . COPD (chronic obstructive pulmonary disease) ([place])   . Coronary artery disease   . Hyperlipidemia   . Personal history of tobacco use, presenting hazards to health 09/21/2015  . Sleep apnea     Review of systems:  Otherwise negative.    Physical Exam  Gen: Alert, oriented. Appears stated age.  HEENT: [place]/AT.  PERRLA. Lungs: CTA, no wheezes. CV: RR nl S1, S2. Abd: soft, benign, no masses. BS+ Ext: No edema. Pulses 2+    Planned procedures: Proceed with colonoscopy. The patient understands the nature of the planned procedure, indications, risks, alternatives and potential complications including but not limited to bleeding, infection, perforation, damage to internal organs and possible oversedation/side effects from anesthesia. The patient agrees and gives consent to proceed.  Please refer to procedure notes for findings, recommendations and patient disposition/instructions.     Loyd Salvador K. Alice Reichert, M.D. Gastroenterology 10/20/2020  9:14 AM

## 2020-10-21 ENCOUNTER — Encounter: Payer: Self-pay | Admitting: Internal Medicine

## 2020-10-21 NOTE — Anesthesia Postprocedure Evaluation (Signed)
Anesthesia Post Note  Patient: Roger Black  Procedure(s) Performed: COLONOSCOPY WITH PROPOFOL (N/A )  Patient location during evaluation: Endoscopy Anesthesia Type: General Level of consciousness: awake and alert and oriented Pain management: pain level controlled Vital Signs Assessment: post-procedure vital signs reviewed and stable Respiratory status: spontaneous breathing Cardiovascular status: blood pressure returned to baseline Anesthetic complications: no   No complications documented.   Last Vitals:  Vitals:   10/20/20 1034 10/20/20 1044  BP: (!) 132/96 (!) 127/95  Pulse:    Resp:    Temp:    SpO2:      Last Pain:  Vitals:   10/21/20 0746  TempSrc:   PainSc: 0-No pain                 Gardy Montanari

## 2020-10-27 ENCOUNTER — Other Ambulatory Visit: Payer: Self-pay

## 2020-10-27 ENCOUNTER — Ambulatory Visit
Admission: RE | Admit: 2020-10-27 | Discharge: 2020-10-27 | Disposition: A | Discharge status: Home / Self Care | Payer: PPO | Source: Ambulatory Visit | Attending: Oncology | Admitting: Oncology

## 2020-10-27 DIAGNOSIS — F17200 Nicotine dependence, unspecified, uncomplicated: Secondary | ICD-10-CM | POA: Insufficient documentation

## 2020-10-27 DIAGNOSIS — F17210 Nicotine dependence, cigarettes, uncomplicated: Secondary | ICD-10-CM | POA: Diagnosis not present

## 2020-10-27 DIAGNOSIS — Z87891 Personal history of nicotine dependence: Secondary | ICD-10-CM

## 2020-10-27 DIAGNOSIS — Z122 Encounter for screening for malignant neoplasm of respiratory organs: Secondary | ICD-10-CM | POA: Insufficient documentation

## 2020-11-03 ENCOUNTER — Encounter: Payer: Self-pay | Admitting: *Deleted

## 2020-12-09 DIAGNOSIS — Z125 Encounter for screening for malignant neoplasm of prostate: Secondary | ICD-10-CM | POA: Diagnosis not present

## 2020-12-09 DIAGNOSIS — R3129 Other microscopic hematuria: Secondary | ICD-10-CM | POA: Diagnosis not present

## 2020-12-09 DIAGNOSIS — R739 Hyperglycemia, unspecified: Secondary | ICD-10-CM | POA: Diagnosis not present

## 2020-12-09 DIAGNOSIS — I2510 Atherosclerotic heart disease of native coronary artery without angina pectoris: Secondary | ICD-10-CM | POA: Diagnosis not present

## 2020-12-09 DIAGNOSIS — I700 Atherosclerosis of aorta: Secondary | ICD-10-CM | POA: Diagnosis not present

## 2020-12-09 DIAGNOSIS — E7849 Other hyperlipidemia: Secondary | ICD-10-CM | POA: Diagnosis not present

## 2020-12-09 DIAGNOSIS — G4733 Obstructive sleep apnea (adult) (pediatric): Secondary | ICD-10-CM | POA: Diagnosis not present

## 2020-12-14 ENCOUNTER — Other Ambulatory Visit: Payer: Self-pay | Admitting: Internal Medicine

## 2020-12-14 ENCOUNTER — Other Ambulatory Visit (HOSPITAL_COMMUNITY): Payer: Self-pay | Admitting: Internal Medicine

## 2020-12-14 DIAGNOSIS — I700 Atherosclerosis of aorta: Secondary | ICD-10-CM | POA: Diagnosis not present

## 2020-12-14 DIAGNOSIS — E7849 Other hyperlipidemia: Secondary | ICD-10-CM | POA: Diagnosis not present

## 2020-12-14 DIAGNOSIS — F17209 Nicotine dependence, unspecified, with unspecified nicotine-induced disorders: Secondary | ICD-10-CM

## 2020-12-14 DIAGNOSIS — R739 Hyperglycemia, unspecified: Secondary | ICD-10-CM | POA: Diagnosis not present

## 2020-12-14 DIAGNOSIS — Z87891 Personal history of nicotine dependence: Secondary | ICD-10-CM | POA: Diagnosis not present

## 2020-12-14 DIAGNOSIS — R3129 Other microscopic hematuria: Secondary | ICD-10-CM | POA: Diagnosis not present

## 2020-12-14 DIAGNOSIS — Z0000 Encounter for general adult medical examination without abnormal findings: Secondary | ICD-10-CM | POA: Diagnosis not present

## 2020-12-14 DIAGNOSIS — G4733 Obstructive sleep apnea (adult) (pediatric): Secondary | ICD-10-CM | POA: Diagnosis not present

## 2020-12-14 DIAGNOSIS — F17210 Nicotine dependence, cigarettes, uncomplicated: Secondary | ICD-10-CM | POA: Diagnosis not present

## 2021-01-18 ENCOUNTER — Other Ambulatory Visit: Payer: Self-pay

## 2021-01-18 ENCOUNTER — Ambulatory Visit
Admission: RE | Admit: 2021-01-18 | Discharge: 2021-01-18 | Disposition: A | Discharge status: Home / Self Care | Payer: PPO | Source: Ambulatory Visit | Attending: Internal Medicine | Admitting: Internal Medicine

## 2021-01-18 DIAGNOSIS — F17210 Nicotine dependence, cigarettes, uncomplicated: Secondary | ICD-10-CM | POA: Insufficient documentation

## 2021-01-18 DIAGNOSIS — I700 Atherosclerosis of aorta: Secondary | ICD-10-CM | POA: Diagnosis not present

## 2021-01-18 DIAGNOSIS — F17209 Nicotine dependence, unspecified, with unspecified nicotine-induced disorders: Secondary | ICD-10-CM

## 2021-01-18 DIAGNOSIS — Z136 Encounter for screening for cardiovascular disorders: Secondary | ICD-10-CM | POA: Insufficient documentation

## 2021-01-18 DIAGNOSIS — Z87891 Personal history of nicotine dependence: Secondary | ICD-10-CM | POA: Diagnosis not present

## 2021-06-13 DIAGNOSIS — R739 Hyperglycemia, unspecified: Secondary | ICD-10-CM | POA: Diagnosis not present

## 2021-06-13 DIAGNOSIS — R3129 Other microscopic hematuria: Secondary | ICD-10-CM | POA: Diagnosis not present

## 2021-06-13 DIAGNOSIS — E7849 Other hyperlipidemia: Secondary | ICD-10-CM | POA: Diagnosis not present

## 2021-06-20 DIAGNOSIS — E7849 Other hyperlipidemia: Secondary | ICD-10-CM | POA: Diagnosis not present

## 2021-06-20 DIAGNOSIS — Z87891 Personal history of nicotine dependence: Secondary | ICD-10-CM | POA: Diagnosis not present

## 2021-06-20 DIAGNOSIS — I700 Atherosclerosis of aorta: Secondary | ICD-10-CM | POA: Diagnosis not present

## 2021-06-20 DIAGNOSIS — Z23 Encounter for immunization: Secondary | ICD-10-CM | POA: Diagnosis not present

## 2021-06-20 DIAGNOSIS — R739 Hyperglycemia, unspecified: Secondary | ICD-10-CM | POA: Diagnosis not present

## 2021-06-20 DIAGNOSIS — R3129 Other microscopic hematuria: Secondary | ICD-10-CM | POA: Diagnosis not present

## 2021-06-20 DIAGNOSIS — I2510 Atherosclerotic heart disease of native coronary artery without angina pectoris: Secondary | ICD-10-CM | POA: Diagnosis not present

## 2021-06-20 DIAGNOSIS — R809 Proteinuria, unspecified: Secondary | ICD-10-CM | POA: Diagnosis not present

## 2021-06-20 DIAGNOSIS — Z125 Encounter for screening for malignant neoplasm of prostate: Secondary | ICD-10-CM | POA: Diagnosis not present

## 2021-06-20 DIAGNOSIS — G4733 Obstructive sleep apnea (adult) (pediatric): Secondary | ICD-10-CM | POA: Diagnosis not present

## 2021-07-19 DIAGNOSIS — R809 Proteinuria, unspecified: Secondary | ICD-10-CM | POA: Diagnosis not present

## 2021-07-21 DIAGNOSIS — I10 Essential (primary) hypertension: Secondary | ICD-10-CM | POA: Diagnosis present

## 2021-07-22 DIAGNOSIS — E7849 Other hyperlipidemia: Secondary | ICD-10-CM | POA: Diagnosis not present

## 2021-07-22 DIAGNOSIS — G4733 Obstructive sleep apnea (adult) (pediatric): Secondary | ICD-10-CM | POA: Diagnosis not present

## 2021-07-22 DIAGNOSIS — I2510 Atherosclerotic heart disease of native coronary artery without angina pectoris: Secondary | ICD-10-CM | POA: Diagnosis not present

## 2021-07-22 DIAGNOSIS — R3129 Other microscopic hematuria: Secondary | ICD-10-CM | POA: Diagnosis not present

## 2021-07-22 DIAGNOSIS — Z87891 Personal history of nicotine dependence: Secondary | ICD-10-CM | POA: Diagnosis not present

## 2021-07-22 DIAGNOSIS — R739 Hyperglycemia, unspecified: Secondary | ICD-10-CM | POA: Diagnosis not present

## 2021-07-22 DIAGNOSIS — I700 Atherosclerosis of aorta: Secondary | ICD-10-CM | POA: Diagnosis not present

## 2021-07-22 DIAGNOSIS — I10 Essential (primary) hypertension: Secondary | ICD-10-CM | POA: Diagnosis not present

## 2021-09-16 NOTE — Telephone Encounter (Signed)
Signing encounter see previous note on 09/29/20 °

## 2021-11-18 ENCOUNTER — Other Ambulatory Visit: Payer: Self-pay

## 2021-11-18 DIAGNOSIS — Z122 Encounter for screening for malignant neoplasm of respiratory organs: Secondary | ICD-10-CM

## 2021-11-18 DIAGNOSIS — F17200 Nicotine dependence, unspecified, uncomplicated: Secondary | ICD-10-CM

## 2021-11-18 DIAGNOSIS — Z87891 Personal history of nicotine dependence: Secondary | ICD-10-CM

## 2021-11-29 ENCOUNTER — Ambulatory Visit: Admission: RE | Admit: 2021-11-29 | Payer: PPO | Source: Ambulatory Visit

## 2021-12-16 DIAGNOSIS — E7849 Other hyperlipidemia: Secondary | ICD-10-CM | POA: Diagnosis not present

## 2021-12-16 DIAGNOSIS — I2510 Atherosclerotic heart disease of native coronary artery without angina pectoris: Secondary | ICD-10-CM | POA: Diagnosis not present

## 2021-12-16 DIAGNOSIS — G4733 Obstructive sleep apnea (adult) (pediatric): Secondary | ICD-10-CM | POA: Diagnosis not present

## 2021-12-16 DIAGNOSIS — R3129 Other microscopic hematuria: Secondary | ICD-10-CM | POA: Diagnosis not present

## 2021-12-16 DIAGNOSIS — R739 Hyperglycemia, unspecified: Secondary | ICD-10-CM | POA: Diagnosis not present

## 2021-12-16 DIAGNOSIS — R809 Proteinuria, unspecified: Secondary | ICD-10-CM | POA: Diagnosis not present

## 2021-12-21 DIAGNOSIS — I700 Atherosclerosis of aorta: Secondary | ICD-10-CM | POA: Diagnosis not present

## 2021-12-21 DIAGNOSIS — Z87891 Personal history of nicotine dependence: Secondary | ICD-10-CM | POA: Diagnosis not present

## 2021-12-21 DIAGNOSIS — M1992 Post-traumatic osteoarthritis, unspecified site: Secondary | ICD-10-CM | POA: Diagnosis not present

## 2021-12-21 DIAGNOSIS — I2510 Atherosclerotic heart disease of native coronary artery without angina pectoris: Secondary | ICD-10-CM | POA: Diagnosis not present

## 2021-12-21 DIAGNOSIS — M47812 Spondylosis without myelopathy or radiculopathy, cervical region: Secondary | ICD-10-CM | POA: Diagnosis not present

## 2021-12-21 DIAGNOSIS — Z0000 Encounter for general adult medical examination without abnormal findings: Secondary | ICD-10-CM | POA: Diagnosis not present

## 2021-12-21 DIAGNOSIS — Z125 Encounter for screening for malignant neoplasm of prostate: Secondary | ICD-10-CM | POA: Diagnosis not present

## 2021-12-21 DIAGNOSIS — G4733 Obstructive sleep apnea (adult) (pediatric): Secondary | ICD-10-CM | POA: Diagnosis not present

## 2021-12-21 DIAGNOSIS — R200 Anesthesia of skin: Secondary | ICD-10-CM | POA: Diagnosis not present

## 2021-12-21 DIAGNOSIS — I10 Essential (primary) hypertension: Secondary | ICD-10-CM | POA: Diagnosis not present

## 2021-12-21 DIAGNOSIS — E7849 Other hyperlipidemia: Secondary | ICD-10-CM | POA: Diagnosis not present

## 2021-12-21 DIAGNOSIS — M542 Cervicalgia: Secondary | ICD-10-CM | POA: Diagnosis not present

## 2021-12-21 DIAGNOSIS — R7303 Prediabetes: Secondary | ICD-10-CM | POA: Diagnosis not present

## 2022-01-27 DIAGNOSIS — M5090 Cervical disc disorder, unspecified, unspecified cervical region: Secondary | ICD-10-CM | POA: Diagnosis not present

## 2022-01-27 DIAGNOSIS — R29898 Other symptoms and signs involving the musculoskeletal system: Secondary | ICD-10-CM | POA: Diagnosis not present

## 2022-01-27 DIAGNOSIS — R202 Paresthesia of skin: Secondary | ICD-10-CM | POA: Diagnosis not present

## 2022-01-27 DIAGNOSIS — H6121 Impacted cerumen, right ear: Secondary | ICD-10-CM | POA: Diagnosis not present

## 2022-01-27 DIAGNOSIS — R200 Anesthesia of skin: Secondary | ICD-10-CM | POA: Diagnosis not present

## 2022-01-27 DIAGNOSIS — H9191 Unspecified hearing loss, right ear: Secondary | ICD-10-CM | POA: Diagnosis not present

## 2022-02-13 ENCOUNTER — Other Ambulatory Visit: Payer: Self-pay | Admitting: Internal Medicine

## 2022-02-13 DIAGNOSIS — R29898 Other symptoms and signs involving the musculoskeletal system: Secondary | ICD-10-CM

## 2022-02-13 DIAGNOSIS — M5090 Cervical disc disorder, unspecified, unspecified cervical region: Secondary | ICD-10-CM

## 2022-02-13 DIAGNOSIS — R200 Anesthesia of skin: Secondary | ICD-10-CM

## 2022-02-15 DIAGNOSIS — H60331 Swimmer's ear, right ear: Secondary | ICD-10-CM | POA: Diagnosis not present

## 2022-02-15 DIAGNOSIS — H6121 Impacted cerumen, right ear: Secondary | ICD-10-CM | POA: Diagnosis not present

## 2022-02-22 ENCOUNTER — Ambulatory Visit
Admission: RE | Admit: 2022-02-22 | Discharge: 2022-02-22 | Disposition: A | Discharge status: Home / Self Care | Payer: PPO | Source: Ambulatory Visit | Attending: Internal Medicine | Admitting: Internal Medicine

## 2022-02-22 DIAGNOSIS — M5090 Cervical disc disorder, unspecified, unspecified cervical region: Secondary | ICD-10-CM

## 2022-02-22 DIAGNOSIS — R200 Anesthesia of skin: Secondary | ICD-10-CM | POA: Diagnosis not present

## 2022-02-22 DIAGNOSIS — M4802 Spinal stenosis, cervical region: Secondary | ICD-10-CM | POA: Diagnosis not present

## 2022-02-22 DIAGNOSIS — R29898 Other symptoms and signs involving the musculoskeletal system: Secondary | ICD-10-CM

## 2022-02-22 DIAGNOSIS — R202 Paresthesia of skin: Secondary | ICD-10-CM | POA: Diagnosis not present

## 2022-03-01 DIAGNOSIS — H903 Sensorineural hearing loss, bilateral: Secondary | ICD-10-CM | POA: Diagnosis not present

## 2022-03-01 DIAGNOSIS — H60331 Swimmer's ear, right ear: Secondary | ICD-10-CM | POA: Diagnosis not present

## 2022-03-15 DIAGNOSIS — M9931 Osseous stenosis of neural canal of cervical region: Secondary | ICD-10-CM | POA: Diagnosis not present

## 2022-03-15 DIAGNOSIS — M542 Cervicalgia: Secondary | ICD-10-CM | POA: Diagnosis not present

## 2022-03-15 DIAGNOSIS — M5412 Radiculopathy, cervical region: Secondary | ICD-10-CM | POA: Diagnosis not present

## 2022-03-29 DIAGNOSIS — M9931 Osseous stenosis of neural canal of cervical region: Secondary | ICD-10-CM | POA: Diagnosis not present

## 2022-03-29 DIAGNOSIS — M5412 Radiculopathy, cervical region: Secondary | ICD-10-CM | POA: Diagnosis not present

## 2022-03-29 DIAGNOSIS — M542 Cervicalgia: Secondary | ICD-10-CM | POA: Diagnosis not present

## 2022-06-12 DIAGNOSIS — Z125 Encounter for screening for malignant neoplasm of prostate: Secondary | ICD-10-CM | POA: Diagnosis not present

## 2022-06-12 DIAGNOSIS — R7303 Prediabetes: Secondary | ICD-10-CM | POA: Diagnosis not present

## 2022-06-12 DIAGNOSIS — G4733 Obstructive sleep apnea (adult) (pediatric): Secondary | ICD-10-CM | POA: Diagnosis not present

## 2022-06-12 DIAGNOSIS — E7849 Other hyperlipidemia: Secondary | ICD-10-CM | POA: Diagnosis not present

## 2022-06-12 DIAGNOSIS — I2510 Atherosclerotic heart disease of native coronary artery without angina pectoris: Secondary | ICD-10-CM | POA: Diagnosis not present

## 2022-06-22 DIAGNOSIS — R3129 Other microscopic hematuria: Secondary | ICD-10-CM | POA: Diagnosis not present

## 2022-06-22 DIAGNOSIS — G4733 Obstructive sleep apnea (adult) (pediatric): Secondary | ICD-10-CM | POA: Diagnosis not present

## 2022-06-22 DIAGNOSIS — I10 Essential (primary) hypertension: Secondary | ICD-10-CM | POA: Diagnosis not present

## 2022-06-22 DIAGNOSIS — R7303 Prediabetes: Secondary | ICD-10-CM | POA: Diagnosis not present

## 2022-06-22 DIAGNOSIS — Z720 Tobacco use: Secondary | ICD-10-CM | POA: Diagnosis not present

## 2022-06-22 DIAGNOSIS — L989 Disorder of the skin and subcutaneous tissue, unspecified: Secondary | ICD-10-CM | POA: Diagnosis not present

## 2022-06-22 DIAGNOSIS — F17210 Nicotine dependence, cigarettes, uncomplicated: Secondary | ICD-10-CM | POA: Diagnosis not present

## 2022-06-22 DIAGNOSIS — I700 Atherosclerosis of aorta: Secondary | ICD-10-CM | POA: Diagnosis not present

## 2022-06-22 DIAGNOSIS — E7849 Other hyperlipidemia: Secondary | ICD-10-CM | POA: Diagnosis not present

## 2022-06-22 DIAGNOSIS — I2510 Atherosclerotic heart disease of native coronary artery without angina pectoris: Secondary | ICD-10-CM | POA: Diagnosis not present

## 2022-06-26 ENCOUNTER — Other Ambulatory Visit: Payer: Self-pay | Admitting: Internal Medicine

## 2022-06-26 DIAGNOSIS — F17210 Nicotine dependence, cigarettes, uncomplicated: Secondary | ICD-10-CM

## 2022-06-26 DIAGNOSIS — Z720 Tobacco use: Secondary | ICD-10-CM

## 2022-07-04 DIAGNOSIS — M9931 Osseous stenosis of neural canal of cervical region: Secondary | ICD-10-CM | POA: Diagnosis not present

## 2022-07-04 DIAGNOSIS — M5412 Radiculopathy, cervical region: Secondary | ICD-10-CM | POA: Diagnosis not present

## 2022-07-04 DIAGNOSIS — M542 Cervicalgia: Secondary | ICD-10-CM | POA: Diagnosis not present

## 2022-07-12 ENCOUNTER — Ambulatory Visit: Payer: PPO | Admitting: Dermatology

## 2022-08-02 DIAGNOSIS — M542 Cervicalgia: Secondary | ICD-10-CM | POA: Diagnosis not present

## 2022-08-02 DIAGNOSIS — M9931 Osseous stenosis of neural canal of cervical region: Secondary | ICD-10-CM | POA: Diagnosis not present

## 2022-08-02 DIAGNOSIS — M5412 Radiculopathy, cervical region: Secondary | ICD-10-CM | POA: Diagnosis not present

## 2022-08-22 DIAGNOSIS — M542 Cervicalgia: Secondary | ICD-10-CM | POA: Diagnosis not present

## 2022-08-29 DIAGNOSIS — M542 Cervicalgia: Secondary | ICD-10-CM | POA: Diagnosis not present

## 2022-08-31 DIAGNOSIS — M542 Cervicalgia: Secondary | ICD-10-CM | POA: Diagnosis not present

## 2022-09-05 DIAGNOSIS — M542 Cervicalgia: Secondary | ICD-10-CM | POA: Diagnosis not present

## 2022-09-07 DIAGNOSIS — M542 Cervicalgia: Secondary | ICD-10-CM | POA: Diagnosis not present

## 2022-09-22 DIAGNOSIS — M542 Cervicalgia: Secondary | ICD-10-CM | POA: Diagnosis not present

## 2022-12-19 DIAGNOSIS — R7303 Prediabetes: Secondary | ICD-10-CM | POA: Diagnosis not present

## 2022-12-19 DIAGNOSIS — I10 Essential (primary) hypertension: Secondary | ICD-10-CM | POA: Diagnosis not present

## 2022-12-19 DIAGNOSIS — E7849 Other hyperlipidemia: Secondary | ICD-10-CM | POA: Diagnosis not present

## 2022-12-19 DIAGNOSIS — R3129 Other microscopic hematuria: Secondary | ICD-10-CM | POA: Diagnosis not present

## 2022-12-26 DIAGNOSIS — J439 Emphysema, unspecified: Secondary | ICD-10-CM | POA: Diagnosis not present

## 2022-12-26 DIAGNOSIS — E7849 Other hyperlipidemia: Secondary | ICD-10-CM | POA: Diagnosis not present

## 2022-12-26 DIAGNOSIS — Z0000 Encounter for general adult medical examination without abnormal findings: Secondary | ICD-10-CM | POA: Diagnosis not present

## 2022-12-26 DIAGNOSIS — F17210 Nicotine dependence, cigarettes, uncomplicated: Secondary | ICD-10-CM | POA: Diagnosis not present

## 2022-12-26 DIAGNOSIS — I2510 Atherosclerotic heart disease of native coronary artery without angina pectoris: Secondary | ICD-10-CM | POA: Diagnosis not present

## 2022-12-26 DIAGNOSIS — I10 Essential (primary) hypertension: Secondary | ICD-10-CM | POA: Diagnosis not present

## 2022-12-26 DIAGNOSIS — R3129 Other microscopic hematuria: Secondary | ICD-10-CM | POA: Diagnosis not present

## 2022-12-26 DIAGNOSIS — I700 Atherosclerosis of aorta: Secondary | ICD-10-CM | POA: Diagnosis not present

## 2022-12-26 DIAGNOSIS — G4733 Obstructive sleep apnea (adult) (pediatric): Secondary | ICD-10-CM | POA: Diagnosis not present

## 2022-12-26 DIAGNOSIS — H6121 Impacted cerumen, right ear: Secondary | ICD-10-CM | POA: Diagnosis not present

## 2022-12-26 DIAGNOSIS — R7303 Prediabetes: Secondary | ICD-10-CM | POA: Diagnosis not present

## 2022-12-26 DIAGNOSIS — Z87891 Personal history of nicotine dependence: Secondary | ICD-10-CM | POA: Diagnosis not present

## 2022-12-27 ENCOUNTER — Other Ambulatory Visit: Payer: Self-pay | Admitting: Internal Medicine

## 2022-12-27 DIAGNOSIS — I10 Essential (primary) hypertension: Secondary | ICD-10-CM

## 2023-01-03 ENCOUNTER — Ambulatory Visit
Admission: RE | Admit: 2023-01-03 | Discharge: 2023-01-03 | Disposition: A | Discharge status: Home / Self Care | Payer: PPO | Source: Ambulatory Visit | Attending: Internal Medicine | Admitting: Internal Medicine

## 2023-01-03 DIAGNOSIS — J439 Emphysema, unspecified: Secondary | ICD-10-CM | POA: Diagnosis not present

## 2023-01-03 DIAGNOSIS — R918 Other nonspecific abnormal finding of lung field: Secondary | ICD-10-CM | POA: Diagnosis not present

## 2023-01-03 DIAGNOSIS — F17210 Nicotine dependence, cigarettes, uncomplicated: Secondary | ICD-10-CM | POA: Insufficient documentation

## 2023-01-03 DIAGNOSIS — Z122 Encounter for screening for malignant neoplasm of respiratory organs: Secondary | ICD-10-CM | POA: Insufficient documentation

## 2023-01-03 DIAGNOSIS — I10 Essential (primary) hypertension: Secondary | ICD-10-CM | POA: Insufficient documentation

## 2023-01-03 DIAGNOSIS — I2510 Atherosclerotic heart disease of native coronary artery without angina pectoris: Secondary | ICD-10-CM | POA: Insufficient documentation

## 2023-01-03 DIAGNOSIS — I700 Atherosclerosis of aorta: Secondary | ICD-10-CM | POA: Insufficient documentation

## 2023-01-19 DIAGNOSIS — C3412 Malignant neoplasm of upper lobe, left bronchus or lung: Secondary | ICD-10-CM | POA: Insufficient documentation

## 2023-01-22 DIAGNOSIS — H60331 Swimmer's ear, right ear: Secondary | ICD-10-CM | POA: Diagnosis not present

## 2023-01-22 DIAGNOSIS — H6121 Impacted cerumen, right ear: Secondary | ICD-10-CM | POA: Diagnosis not present

## 2023-01-24 DIAGNOSIS — J439 Emphysema, unspecified: Secondary | ICD-10-CM | POA: Diagnosis not present

## 2023-02-12 DIAGNOSIS — H6982 Other specified disorders of Eustachian tube, left ear: Secondary | ICD-10-CM | POA: Diagnosis not present

## 2023-02-12 DIAGNOSIS — H903 Sensorineural hearing loss, bilateral: Secondary | ICD-10-CM | POA: Diagnosis not present

## 2023-04-23 ENCOUNTER — Other Ambulatory Visit: Payer: Self-pay | Admitting: Pulmonary Disease

## 2023-04-23 DIAGNOSIS — R911 Solitary pulmonary nodule: Secondary | ICD-10-CM

## 2023-04-30 ENCOUNTER — Ambulatory Visit
Admission: RE | Admit: 2023-04-30 | Discharge: 2023-04-30 | Disposition: A | Discharge status: Home / Self Care | Payer: PPO | Source: Ambulatory Visit | Attending: Pulmonary Disease | Admitting: Pulmonary Disease

## 2023-04-30 VITALS — Wt 183.6 lb

## 2023-04-30 DIAGNOSIS — I700 Atherosclerosis of aorta: Secondary | ICD-10-CM | POA: Insufficient documentation

## 2023-04-30 DIAGNOSIS — R911 Solitary pulmonary nodule: Secondary | ICD-10-CM | POA: Diagnosis not present

## 2023-04-30 DIAGNOSIS — R059 Cough, unspecified: Secondary | ICD-10-CM | POA: Diagnosis not present

## 2023-04-30 DIAGNOSIS — N200 Calculus of kidney: Secondary | ICD-10-CM | POA: Diagnosis not present

## 2023-04-30 DIAGNOSIS — F17200 Nicotine dependence, unspecified, uncomplicated: Secondary | ICD-10-CM | POA: Diagnosis not present

## 2023-04-30 DIAGNOSIS — N281 Cyst of kidney, acquired: Secondary | ICD-10-CM | POA: Insufficient documentation

## 2023-04-30 DIAGNOSIS — R918 Other nonspecific abnormal finding of lung field: Secondary | ICD-10-CM | POA: Diagnosis not present

## 2023-04-30 LAB — GLUCOSE, CAPILLARY: Glucose-Capillary: 109 mg/dL — ABNORMAL HIGH (ref 70–99)

## 2023-04-30 MED ORDER — FLUDEOXYGLUCOSE F - 18 (FDG) INJECTION
9.5000 | Freq: Once | INTRAVENOUS | Status: AC | PRN
  Administered 2023-04-30: 9.87 via INTRAVENOUS

## 2023-05-16 DIAGNOSIS — C3412 Malignant neoplasm of upper lobe, left bronchus or lung: Secondary | ICD-10-CM | POA: Diagnosis not present

## 2023-05-24 ENCOUNTER — Ambulatory Visit
Admission: RE | Admit: 2023-05-24 | Discharge: 2023-05-24 | Disposition: A | Discharge status: Home / Self Care | Payer: PPO | Source: Ambulatory Visit | Attending: Radiation Oncology | Admitting: Radiation Oncology

## 2023-05-24 ENCOUNTER — Ambulatory Visit: Payer: PPO | Admitting: Dermatology

## 2023-05-24 VITALS — Temp 96.1°F | Resp 14 | Ht 69.0 in | Wt 182.6 lb

## 2023-05-24 DIAGNOSIS — J449 Chronic obstructive pulmonary disease, unspecified: Secondary | ICD-10-CM | POA: Diagnosis not present

## 2023-05-24 DIAGNOSIS — E785 Hyperlipidemia, unspecified: Secondary | ICD-10-CM | POA: Diagnosis not present

## 2023-05-24 DIAGNOSIS — L570 Actinic keratosis: Secondary | ICD-10-CM | POA: Insufficient documentation

## 2023-05-24 DIAGNOSIS — G4730 Sleep apnea, unspecified: Secondary | ICD-10-CM | POA: Insufficient documentation

## 2023-05-24 DIAGNOSIS — I700 Atherosclerosis of aorta: Secondary | ICD-10-CM | POA: Insufficient documentation

## 2023-05-24 DIAGNOSIS — I2510 Atherosclerotic heart disease of native coronary artery without angina pectoris: Secondary | ICD-10-CM | POA: Diagnosis not present

## 2023-05-24 DIAGNOSIS — Z79899 Other long term (current) drug therapy: Secondary | ICD-10-CM | POA: Diagnosis not present

## 2023-05-24 DIAGNOSIS — Z87891 Personal history of nicotine dependence: Secondary | ICD-10-CM | POA: Diagnosis not present

## 2023-05-24 DIAGNOSIS — M129 Arthropathy, unspecified: Secondary | ICD-10-CM | POA: Diagnosis not present

## 2023-05-24 DIAGNOSIS — C3412 Malignant neoplasm of upper lobe, left bronchus or lung: Secondary | ICD-10-CM | POA: Diagnosis not present

## 2023-05-24 DIAGNOSIS — F17210 Nicotine dependence, cigarettes, uncomplicated: Secondary | ICD-10-CM | POA: Diagnosis not present

## 2023-05-24 DIAGNOSIS — Z7951 Long term (current) use of inhaled steroids: Secondary | ICD-10-CM | POA: Diagnosis not present

## 2023-05-24 NOTE — Progress Notes (Addendum)
NEW PATIENT EVALUATION  Name: Roger Black  MRN: [id number]  Date:   05/24/2023     DOB: [birth year]   This 67 y.o. male patient presents to the clinic for initial evaluation of new hypermetabolic left lung lesion consistent with non-small cell lung cancer for SBRT treatment.  REFERRING PHYSICIAN: Lynnea Ferrier, MD  CHIEF COMPLAINT:  Chief Complaint  Patient presents with   Consult    DIAGNOSIS: The encounter diagnosis was Malignant neoplasm of bronchus of left upper lobe (HCC).   PREVIOUS INVESTIGATIONS:  PET CT and CT scans reviewed Labs reviewed Clinical notes reviewed  HPI: A 67 year old lifelong smoker presented with an abnormal screening CT scan in the chest. The scan, conducted in June, revealed a new aggressive appearing nodule in the left upper lobe, categorized as 4BS suspicious. No evidence of mediastinal or distant disease in the chest was noted. A subsequent PET CT scan confirmed a 13mm hypermetabolic nodule in the medial left lung apex, raising suspicion for primary bronchogenic carcinoma. No evidence of metastatic disease was noted. The patient has been experiencing sinus drainage and a persistent cough. He recently quit smoking.  He is now referred to radiation oncology for consistent consideration of SBRT treatment.  Lesion appears to be difficult for navigational bronchoscopy.  PLANNED TREATMENT REGIMEN: SBRT  PAST MEDICAL HISTORY:  has a past medical history of Actinic keratosis, Aortic atherosclerosis (HCC), Arthritis, Asthma, COPD (chronic obstructive pulmonary disease) (HCC), Coronary artery disease, Hyperlipidemia, Personal history of tobacco use, presenting hazards to health (09/21/2015), and Sleep apnea.    PAST SURGICAL HISTORY:  Past Surgical History:  Procedure Laterality Date   COLON SURGERY     COLONOSCOPY WITH PROPOFOL N/A 10/20/2020   Procedure: COLONOSCOPY WITH PROPOFOL;  Surgeon: Toledo, Boykin Nearing, MD;  Location: [facility] ENDOSCOPY;  Service:  Gastroenterology;  Laterality: N/A;    FAMILY HISTORY: family history is not on file.  SOCIAL HISTORY:  reports that he has been smoking cigarettes. He has a 47 pack-year smoking history. He has never used smokeless tobacco. He reports that he does not drink alcohol and does not use drugs.  ALLERGIES: Patient has no known allergies.  MEDICATIONS:  Current Outpatient Medications  Medication Sig Dispense Refill   albuterol (VENTOLIN HFA) 108 (90 Base) MCG/ACT inhaler Inhale into the lungs.     aspirin 81 MG EC tablet Take by mouth.     atorvastatin (LIPITOR) 80 MG tablet Take by mouth.     budesonide-formoterol (SYMBICORT) 160-4.5 MCG/ACT inhaler Inhale 2 puffs into the lungs 2 (two) times daily.     levocetirizine (XYZAL) 5 MG tablet Take 5 mg by mouth at bedtime.     montelukast (SINGULAIR) 10 MG tablet Take 10 mg by mouth daily.     Na Sulfate-K Sulfate-Mg Sulf (SUPREP BOWEL PREP KIT PO) Take by mouth once.     pantoprazole (PROTONIX) 40 MG tablet Take by mouth.     No current facility-administered medications for this encounter.    ECOG PERFORMANCE STATUS:  0 - Asymptomatic  REVIEW OF SYSTEMS: Patient denies any weight loss, fatigue, weakness, fever, chills or night sweats. Patient denies any loss of vision, blurred vision. Patient denies any ringing  of the ears or hearing loss. No irregular heartbeat. Patient denies heart murmur or history of fainting. Patient denies any chest pain or pain radiating to her upper extremities. Patient denies any shortness of breath, difficulty breathing at night, cough or hemoptysis. Patient denies any swelling in the lower legs. Patient  denies any nausea vomiting, vomiting of blood, or coffee ground material in the vomitus. Patient denies any stomach pain. Patient states has had normal bowel movements no significant constipation or diarrhea. Patient denies any dysuria, hematuria or significant nocturia. Patient denies any problems walking, swelling in  the joints or loss of balance. Patient denies any skin changes, loss of hair or loss of weight. Patient denies any excessive worrying or anxiety or significant depression. Patient denies any problems with insomnia. Patient denies excessive thirst, polyuria, polydipsia. Patient denies any swollen glands, patient denies easy bruising or easy bleeding. Patient denies any recent infections, allergies or URI. Patient "s visual fields have not changed significantly in recent time.   PHYSICAL EXAM: Temp (!) 96.1 F (35.6 C)   Resp 14   Ht 5\' 9"  (1.753 m)   Wt 182 lb 9.6 oz (82.8 kg)   BMI 26.97 kg/m  Well-developed well-nourished patient in NAD. HEENT reveals PERLA, EOMI, discs not visualized.  Oral cavity is clear. No oral mucosal lesions are identified. Neck is clear without evidence of cervical or supraclavicular adenopathy. Lungs are clear to A&P. Cardiac examination is essentially unremarkable with regular rate and rhythm without murmur rub or thrill. Abdomen is benign with no organomegaly or masses noted. Motor sensory and DTR levels are equal and symmetric in the upper and lower extremities. Cranial nerves II through XII are grossly intact. Proprioception is intact. No peripheral adenopathy or edema is identified. No motor or sensory levels are noted. Crude visual fields are within normal range.  LABORATORY DATA: Labs reviewed    RADIOLOGY RESULTS: CT scan PET CT scan reviewed compatible with above-stated findings for stage I non-small cell lung cancer of the left lung Chest CT: New aggressive appearing nodule in the left upper lobe categorized as 4BS suspicious. Evidence of mediastinal or distant disease. (12/2022) PET CT: 13 mm hypermetabolic nodule in the medial left lung apex suspicious for primary bronchogenic carcinoma. No evidence of metastatic disease.   IMPRESSION: Stage I non-small cell lung cancer of the left lung in 67 year old male  PLAN: At this time have recommended SBRT  treatment to his left upper lobe lesion.  Would plan on delivering 60 Gray in 5 fractions.  Risks and benefits and especially since low side effect profile from SBRT was reviewed with the patient.  He male developed a slight cough about a month out from treatment.  I have personally set up and ordered CT simulation for next week.  Patient has consented to treatment.  I would like to take this opportunity to thank you for allowing me to participate in the care of your patient.Carmina Miller, MD

## 2023-05-31 ENCOUNTER — Other Ambulatory Visit: Payer: Self-pay | Admitting: Radiation Oncology

## 2023-05-31 DIAGNOSIS — C801 Malignant (primary) neoplasm, unspecified: Secondary | ICD-10-CM

## 2023-06-01 ENCOUNTER — Ambulatory Visit
Admission: RE | Admit: 2023-06-01 | Discharge: 2023-06-01 | Disposition: A | Discharge status: Home / Self Care | Payer: PPO | Source: Ambulatory Visit | Attending: Radiation Oncology | Admitting: Radiation Oncology

## 2023-06-01 DIAGNOSIS — I2510 Atherosclerotic heart disease of native coronary artery without angina pectoris: Secondary | ICD-10-CM | POA: Diagnosis not present

## 2023-06-01 DIAGNOSIS — C3412 Malignant neoplasm of upper lobe, left bronchus or lung: Secondary | ICD-10-CM | POA: Diagnosis not present

## 2023-06-01 DIAGNOSIS — I700 Atherosclerosis of aorta: Secondary | ICD-10-CM | POA: Insufficient documentation

## 2023-06-01 DIAGNOSIS — M129 Arthropathy, unspecified: Secondary | ICD-10-CM | POA: Diagnosis not present

## 2023-06-01 DIAGNOSIS — C801 Malignant (primary) neoplasm, unspecified: Secondary | ICD-10-CM

## 2023-06-01 DIAGNOSIS — Z87891 Personal history of nicotine dependence: Secondary | ICD-10-CM | POA: Insufficient documentation

## 2023-06-01 DIAGNOSIS — E785 Hyperlipidemia, unspecified: Secondary | ICD-10-CM | POA: Insufficient documentation

## 2023-06-01 DIAGNOSIS — G4730 Sleep apnea, unspecified: Secondary | ICD-10-CM | POA: Diagnosis not present

## 2023-06-01 DIAGNOSIS — L570 Actinic keratosis: Secondary | ICD-10-CM | POA: Insufficient documentation

## 2023-06-01 DIAGNOSIS — J449 Chronic obstructive pulmonary disease, unspecified: Secondary | ICD-10-CM | POA: Diagnosis not present

## 2023-06-01 DIAGNOSIS — Z79899 Other long term (current) drug therapy: Secondary | ICD-10-CM | POA: Insufficient documentation

## 2023-06-01 DIAGNOSIS — Z7951 Long term (current) use of inhaled steroids: Secondary | ICD-10-CM | POA: Diagnosis not present

## 2023-06-05 DIAGNOSIS — C3412 Malignant neoplasm of upper lobe, left bronchus or lung: Secondary | ICD-10-CM | POA: Diagnosis not present

## 2023-06-12 ENCOUNTER — Ambulatory Visit
Admission: RE | Admit: 2023-06-12 | Discharge: 2023-06-12 | Disposition: A | Discharge status: Home / Self Care | Payer: PPO | Source: Ambulatory Visit | Attending: Radiation Oncology | Admitting: Radiation Oncology

## 2023-06-12 ENCOUNTER — Other Ambulatory Visit: Payer: Self-pay

## 2023-06-12 DIAGNOSIS — C3412 Malignant neoplasm of upper lobe, left bronchus or lung: Secondary | ICD-10-CM | POA: Diagnosis not present

## 2023-06-12 LAB — RAD ONC ARIA SESSION SUMMARY
Course Elapsed Days: 0
Plan Fractions Treated to Date: 1
Plan Prescribed Dose Per Fraction: 12 Gy
Plan Total Fractions Prescribed: 5
Plan Total Prescribed Dose: 60 Gy
Reference Point Dosage Given to Date: 12 Gy
Reference Point Session Dosage Given: 12 Gy
Session Number: 1

## 2023-06-14 ENCOUNTER — Other Ambulatory Visit: Payer: Self-pay

## 2023-06-14 ENCOUNTER — Ambulatory Visit
Admission: RE | Admit: 2023-06-14 | Discharge: 2023-06-14 | Disposition: A | Discharge status: Home / Self Care | Payer: PPO | Source: Ambulatory Visit | Attending: Radiation Oncology | Admitting: Radiation Oncology

## 2023-06-14 DIAGNOSIS — C3412 Malignant neoplasm of upper lobe, left bronchus or lung: Secondary | ICD-10-CM | POA: Diagnosis not present

## 2023-06-14 LAB — RAD ONC ARIA SESSION SUMMARY
Course Elapsed Days: 2
Plan Fractions Treated to Date: 2
Plan Prescribed Dose Per Fraction: 12 Gy
Plan Total Fractions Prescribed: 5
Plan Total Prescribed Dose: 60 Gy
Reference Point Dosage Given to Date: 24 Gy
Reference Point Session Dosage Given: 12 Gy
Session Number: 2

## 2023-06-19 ENCOUNTER — Other Ambulatory Visit: Payer: Self-pay

## 2023-06-19 ENCOUNTER — Ambulatory Visit
Admission: RE | Admit: 2023-06-19 | Discharge: 2023-06-19 | Disposition: A | Discharge status: Home / Self Care | Payer: PPO | Source: Ambulatory Visit | Attending: Radiation Oncology | Admitting: Radiation Oncology

## 2023-06-19 DIAGNOSIS — C3412 Malignant neoplasm of upper lobe, left bronchus or lung: Secondary | ICD-10-CM | POA: Diagnosis not present

## 2023-06-19 LAB — RAD ONC ARIA SESSION SUMMARY
Course Elapsed Days: 7
Plan Fractions Treated to Date: 3
Plan Prescribed Dose Per Fraction: 12 Gy
Plan Total Fractions Prescribed: 5
Plan Total Prescribed Dose: 60 Gy
Reference Point Dosage Given to Date: 36 Gy
Reference Point Session Dosage Given: 12 Gy
Session Number: 3

## 2023-06-20 DIAGNOSIS — I2510 Atherosclerotic heart disease of native coronary artery without angina pectoris: Secondary | ICD-10-CM | POA: Diagnosis not present

## 2023-06-20 DIAGNOSIS — I700 Atherosclerosis of aorta: Secondary | ICD-10-CM | POA: Diagnosis not present

## 2023-06-20 DIAGNOSIS — I10 Essential (primary) hypertension: Secondary | ICD-10-CM | POA: Diagnosis not present

## 2023-06-20 DIAGNOSIS — R7303 Prediabetes: Secondary | ICD-10-CM | POA: Diagnosis not present

## 2023-06-20 DIAGNOSIS — E7849 Other hyperlipidemia: Secondary | ICD-10-CM | POA: Diagnosis not present

## 2023-06-20 DIAGNOSIS — R3129 Other microscopic hematuria: Secondary | ICD-10-CM | POA: Diagnosis not present

## 2023-06-20 DIAGNOSIS — G4733 Obstructive sleep apnea (adult) (pediatric): Secondary | ICD-10-CM | POA: Diagnosis not present

## 2023-06-26 ENCOUNTER — Ambulatory Visit
Admission: RE | Admit: 2023-06-26 | Discharge: 2023-06-26 | Disposition: A | Discharge status: Home / Self Care | Payer: PPO | Source: Ambulatory Visit | Attending: Radiation Oncology | Admitting: Radiation Oncology

## 2023-06-26 ENCOUNTER — Other Ambulatory Visit: Payer: Self-pay

## 2023-06-26 DIAGNOSIS — I2510 Atherosclerotic heart disease of native coronary artery without angina pectoris: Secondary | ICD-10-CM | POA: Insufficient documentation

## 2023-06-26 DIAGNOSIS — L570 Actinic keratosis: Secondary | ICD-10-CM | POA: Insufficient documentation

## 2023-06-26 DIAGNOSIS — I700 Atherosclerosis of aorta: Secondary | ICD-10-CM | POA: Diagnosis not present

## 2023-06-26 DIAGNOSIS — E785 Hyperlipidemia, unspecified: Secondary | ICD-10-CM | POA: Diagnosis not present

## 2023-06-26 DIAGNOSIS — Z79899 Other long term (current) drug therapy: Secondary | ICD-10-CM | POA: Insufficient documentation

## 2023-06-26 DIAGNOSIS — M129 Arthropathy, unspecified: Secondary | ICD-10-CM | POA: Diagnosis not present

## 2023-06-26 DIAGNOSIS — J449 Chronic obstructive pulmonary disease, unspecified: Secondary | ICD-10-CM | POA: Diagnosis not present

## 2023-06-26 DIAGNOSIS — Z7951 Long term (current) use of inhaled steroids: Secondary | ICD-10-CM | POA: Insufficient documentation

## 2023-06-26 DIAGNOSIS — G4730 Sleep apnea, unspecified: Secondary | ICD-10-CM | POA: Diagnosis not present

## 2023-06-26 DIAGNOSIS — Z87891 Personal history of nicotine dependence: Secondary | ICD-10-CM | POA: Insufficient documentation

## 2023-06-26 DIAGNOSIS — C3412 Malignant neoplasm of upper lobe, left bronchus or lung: Secondary | ICD-10-CM | POA: Diagnosis not present

## 2023-06-26 LAB — RAD ONC ARIA SESSION SUMMARY
Course Elapsed Days: 14
Plan Fractions Treated to Date: 4
Plan Prescribed Dose Per Fraction: 12 Gy
Plan Total Fractions Prescribed: 5
Plan Total Prescribed Dose: 60 Gy
Reference Point Dosage Given to Date: 48 Gy
Reference Point Session Dosage Given: 12 Gy
Session Number: 4

## 2023-06-28 ENCOUNTER — Ambulatory Visit
Admission: RE | Admit: 2023-06-28 | Discharge: 2023-06-28 | Disposition: A | Discharge status: Home / Self Care | Payer: PPO | Source: Ambulatory Visit | Attending: Radiation Oncology | Admitting: Radiation Oncology

## 2023-06-28 ENCOUNTER — Other Ambulatory Visit: Payer: Self-pay

## 2023-06-28 DIAGNOSIS — Z23 Encounter for immunization: Secondary | ICD-10-CM | POA: Diagnosis not present

## 2023-06-28 DIAGNOSIS — R3129 Other microscopic hematuria: Secondary | ICD-10-CM | POA: Diagnosis not present

## 2023-06-28 DIAGNOSIS — Z125 Encounter for screening for malignant neoplasm of prostate: Secondary | ICD-10-CM | POA: Diagnosis not present

## 2023-06-28 DIAGNOSIS — R7303 Prediabetes: Secondary | ICD-10-CM | POA: Diagnosis not present

## 2023-06-28 DIAGNOSIS — G4733 Obstructive sleep apnea (adult) (pediatric): Secondary | ICD-10-CM | POA: Diagnosis not present

## 2023-06-28 DIAGNOSIS — E7849 Other hyperlipidemia: Secondary | ICD-10-CM | POA: Diagnosis not present

## 2023-06-28 DIAGNOSIS — C3412 Malignant neoplasm of upper lobe, left bronchus or lung: Secondary | ICD-10-CM | POA: Diagnosis not present

## 2023-06-28 DIAGNOSIS — I10 Essential (primary) hypertension: Secondary | ICD-10-CM | POA: Diagnosis not present

## 2023-06-28 DIAGNOSIS — L989 Disorder of the skin and subcutaneous tissue, unspecified: Secondary | ICD-10-CM | POA: Diagnosis not present

## 2023-06-28 DIAGNOSIS — I700 Atherosclerosis of aorta: Secondary | ICD-10-CM | POA: Diagnosis not present

## 2023-06-28 DIAGNOSIS — I2510 Atherosclerotic heart disease of native coronary artery without angina pectoris: Secondary | ICD-10-CM | POA: Diagnosis not present

## 2023-06-28 DIAGNOSIS — Z599 Problem related to housing and economic circumstances, unspecified: Secondary | ICD-10-CM | POA: Diagnosis not present

## 2023-06-28 LAB — RAD ONC ARIA SESSION SUMMARY
Course Elapsed Days: 16
Plan Fractions Treated to Date: 5
Plan Prescribed Dose Per Fraction: 12 Gy
Plan Total Fractions Prescribed: 5
Plan Total Prescribed Dose: 60 Gy
Reference Point Dosage Given to Date: 60 Gy
Reference Point Session Dosage Given: 12 Gy
Session Number: 5

## 2023-06-29 NOTE — Radiation Completion Notes (Signed)
Patient Name: Roger Black, Roger Black MRN: [id number] Date of Birth: 1955/08/02 Referring Physician: Daniel Nones, M.D. Date of Service: 2023-06-29 Radiation Oncologist: Carmina Miller, M.D. [facility]                             RADIATION ONCOLOGY END OF TREATMENT NOTE     Diagnosis: C34.12 Malignant neoplasm of upper lobe, left bronchus or lung Intent: Curative     HPI: A 67 year old lifelong smoker presented with an abnormal screening CT scan in the chest. The scan, conducted in June, revealed a new aggressive appearing nodule in the left upper lobe, categorized as 4BS suspicious. No evidence of mediastinal or distant disease in the chest was noted. A subsequent PET CT scan confirmed a 13mm hypermetabolic nodule in the medial left lung apex, raising suspicion for primary bronchogenic carcinoma. No evidence of metastatic disease was noted. The patient has been experiencing sinus drainage and a persistent cough. He recently quit smoking.  He is now referred to radiation oncology for consistent consideration of SBRT treatment.  Lesion appears to be difficult for navigational bronchoscopy.      ==========DELIVERED PLANS==========  First Treatment Date: 2023-06-12 Last Treatment Date: 2023-06-28   Plan Name: Lung_L_SBRT Site: Lung, Left Technique: SBRT/SRT-IMRT Mode: Photon Dose Per Fraction: 12 Gy Prescribed Dose (Delivered / Prescribed): 60 Gy / 60 Gy Prescribed Fxs (Delivered / Prescribed): 5 / 5     ==========ON TREATMENT VISIT DATES========== 2023-06-12, 2023-06-14, 2023-06-19, 2023-06-19, 2023-06-26, 2023-06-28     ==========UPCOMING VISITS========== 08/06/2023 [facility] RAD ONCOLOGY FOLLOW UP 30 Chrystal, Glenn, MD        ==========APPENDIX - ON TREATMENT VISIT NOTES==========   See weekly On Treatment Notes in Epic for details in the Media tab (listed as Progress notes on the On Treatment Visit Dates listed above).

## 2023-07-03 DIAGNOSIS — M9931 Osseous stenosis of neural canal of cervical region: Secondary | ICD-10-CM | POA: Diagnosis not present

## 2023-07-03 DIAGNOSIS — M542 Cervicalgia: Secondary | ICD-10-CM | POA: Diagnosis not present

## 2023-07-03 DIAGNOSIS — M5412 Radiculopathy, cervical region: Secondary | ICD-10-CM | POA: Diagnosis not present

## 2023-07-12 DIAGNOSIS — Z23 Encounter for immunization: Secondary | ICD-10-CM | POA: Diagnosis not present

## 2023-08-06 ENCOUNTER — Encounter: Payer: Self-pay | Admitting: Radiation Oncology

## 2023-08-06 ENCOUNTER — Ambulatory Visit
Admission: RE | Admit: 2023-08-06 | Discharge: 2023-08-06 | Disposition: A | Discharge status: Home / Self Care | Payer: PPO | Source: Ambulatory Visit | Attending: Radiation Oncology | Admitting: Radiation Oncology

## 2023-08-06 ENCOUNTER — Other Ambulatory Visit: Payer: Self-pay | Admitting: *Deleted

## 2023-08-06 VITALS — BP 126/83 | HR 94 | Temp 97.0°F | Resp 16 | Wt 194.0 lb

## 2023-08-06 DIAGNOSIS — C3412 Malignant neoplasm of upper lobe, left bronchus or lung: Secondary | ICD-10-CM | POA: Diagnosis not present

## 2023-08-06 NOTE — Progress Notes (Signed)
 Radiation Oncology Follow up Note  Name: Roger Black   Date:   08/06/2023 MRN:  [id number] DOB: 1956/03/25    This 68 y.o. male presents to the clinic today for 1 month follow-up status post SBRT to his left lung for non-small cell lung cancer.  REFERRING PROVIDER: [name], MD  HPI: Patient is a 68 year old male now out 1 month having completed SBRT to his left lung for presumed stage I non-small cell lung cancer.  He is seen today in routine follow-up doing well specifically [place] cough hemoptysis chest tightness or any change in his pulmonary status..  COMPLICATIONS OF TREATMENT: none  FOLLOW UP COMPLIANCE: keeps appointments   PHYSICAL EXAM:  BP 126/83   Pulse 94   Temp (!) 97 F (36.1 C)   Resp 16   Wt 194 lb (88 kg)   BMI 28.65 kg/m  Well-developed well-nourished patient in NAD. HEENT reveals PERLA, EOMI, discs not visualized.  Oral cavity is clear. No oral mucosal lesions are identified. Neck is clear without evidence of cervical or supraclavicular adenopathy. Lungs are clear to A&P. Cardiac examination is essentially unremarkable with regular rate and rhythm without murmur rub or thrill. Abdomen is benign with no organomegaly or masses noted. Motor sensory and DTR levels are equal and symmetric in the upper and lower extremities. Cranial nerves II through XII are grossly intact. Proprioception is intact. No peripheral adenopathy or edema is identified. No motor or sensory levels are noted. Crude visual fields are within normal range.  RADIOLOGY RESULTS: Follow-up CT scan ordered  PLAN: Present time patient is a very low side effect profile associated with his SBRT treatment.  And pleased with his overall progress I have asked to see him back in 3 months with a CT scan of his chest prior to that visit.  Patient knows to call with any concerns.  I would like to take this opportunity to thank you for allowing me to participate in the care of your patient.SABRA [name], MD

## 2023-08-13 DIAGNOSIS — H6983 Other specified disorders of Eustachian tube, bilateral: Secondary | ICD-10-CM | POA: Diagnosis not present

## 2023-08-16 DIAGNOSIS — C3492 Malignant neoplasm of unspecified part of left bronchus or lung: Secondary | ICD-10-CM | POA: Diagnosis not present

## 2023-09-05 DIAGNOSIS — J439 Emphysema, unspecified: Secondary | ICD-10-CM | POA: Diagnosis not present

## 2023-09-05 DIAGNOSIS — I700 Atherosclerosis of aorta: Secondary | ICD-10-CM | POA: Diagnosis not present

## 2023-09-05 DIAGNOSIS — Z87891 Personal history of nicotine dependence: Secondary | ICD-10-CM | POA: Diagnosis not present

## 2023-09-05 DIAGNOSIS — C3412 Malignant neoplasm of upper lobe, left bronchus or lung: Secondary | ICD-10-CM | POA: Diagnosis not present

## 2023-09-05 DIAGNOSIS — M79675 Pain in left toe(s): Secondary | ICD-10-CM | POA: Diagnosis not present

## 2023-09-18 DIAGNOSIS — M10072 Idiopathic gout, left ankle and foot: Secondary | ICD-10-CM | POA: Diagnosis not present

## 2023-09-18 DIAGNOSIS — R7303 Prediabetes: Secondary | ICD-10-CM | POA: Diagnosis not present

## 2023-09-18 DIAGNOSIS — M2012 Hallux valgus (acquired), left foot: Secondary | ICD-10-CM | POA: Diagnosis not present

## 2023-10-17 ENCOUNTER — Ambulatory Visit: Payer: PPO | Admitting: Dermatology

## 2023-10-17 ENCOUNTER — Encounter: Payer: Self-pay | Admitting: Dermatology

## 2023-10-17 DIAGNOSIS — D492 Neoplasm of unspecified behavior of bone, soft tissue, and skin: Secondary | ICD-10-CM | POA: Diagnosis not present

## 2023-10-17 DIAGNOSIS — L578 Other skin changes due to chronic exposure to nonionizing radiation: Secondary | ICD-10-CM

## 2023-10-17 DIAGNOSIS — W908XXA Exposure to other nonionizing radiation, initial encounter: Secondary | ICD-10-CM | POA: Diagnosis not present

## 2023-10-17 DIAGNOSIS — D0461 Carcinoma in situ of skin of right upper limb, including shoulder: Secondary | ICD-10-CM | POA: Diagnosis not present

## 2023-10-17 DIAGNOSIS — L570 Actinic keratosis: Secondary | ICD-10-CM | POA: Diagnosis not present

## 2023-10-17 DIAGNOSIS — D044 Carcinoma in situ of skin of scalp and neck: Secondary | ICD-10-CM | POA: Diagnosis not present

## 2023-10-17 NOTE — Progress Notes (Signed)
 New Patient Visit   Subjective  Roger Black is a 67 y.o. male who presents for the following: place at top of scalp ~couple years, R hand ~3-4 years, places itch at times and get sore. Unsure of any personal hx of skin cancers, unsure of any family hx skin cancers.   The patient has spots, moles and lesions to be evaluated, some may be new or changing and the patient may have concern these could be cancer.   The following portions of the chart were reviewed this encounter and updated as appropriate: medications, allergies, medical history  Review of Systems:  No other skin or systemic complaints except as noted in HPI or Assessment and Plan.  Objective  Well appearing patient in no apparent distress; mood and affect are within normal limits.  A focused examination was performed of the following areas: Scalp, B/L hands   Relevant exam findings are noted in the Assessment and Plan.  Vertex Scalp 13mm keratotic plaque  R lateral hand 17mm Pink scaly eroded patch   Assessment & Plan   ACTINIC DAMAGE WITH PRECANCEROUS ACTINIC KERATOSES Counseling for Topical Chemotherapy Management: Patient exhibits: - Severe, confluent actinic changes with pre-cancerous actinic keratoses that is secondary to cumulative UV radiation exposure over time - Condition that is severe; chronic, not at goal. - diffuse scaly erythematous macules and papules with underlying dyspigmentation - Discussed Prescription "Field Treatment" topical Chemotherapy for Severe, Chronic Confluent Actinic Changes with Pre-Cancerous Actinic Keratoses Field treatment involves treatment of an entire area of skin that has confluent Actinic Changes (Sun/ Ultraviolet light damage) and PreCancerous Actinic Keratoses by method of PhotoDynamic Therapy (PDT) and/or prescription Topical Chemotherapy agents such as 5-fluorouracil, 5-fluorouracil/calcipotriene, and/or imiquimod.  The purpose is to decrease the number of  clinically evident and subclinical PreCancerous lesions to prevent progression to development of skin cancer by chemically destroying early precancer changes that may or may not be visible.  It has been shown to reduce the risk of developing skin cancer in the treated area. As a result of treatment, redness, scaling, crusting, and open sores may occur during treatment course. One or more than one of these methods may be used and may have to be used several times to control, suppress and eliminate the PreCancerous changes. Discussed treatment course, expected reaction, and possible side effects. - Recommend daily broad spectrum sunscreen SPF 30+ to sun-exposed areas, reapply every 2 hours as needed.  - Staying in the shade or wearing long sleeves, sun glasses (UVA+UVB protection) and wide brim hats (4-inch brim around the entire circumference of the hat) are also recommended. - Call for new or changing lesions.   NEOPLASM OF SKIN (2) Vertex Scalp Skin / nail biopsy Type of biopsy: tangential   Informed consent: discussed and consent obtained   Timeout: patient name, date of birth, surgical site, and procedure verified   Procedure prep:  Patient was prepped and draped in usual sterile fashion Prep type:  Isopropyl alcohol Anesthesia: the lesion was anesthetized in a standard fashion   Anesthetic:  1% lidocaine w/ epinephrine 1-100,000 buffered w/ 8.4% NaHCO3 Instrument used: DermaBlade   Hemostasis achieved with: pressure and aluminum chloride   Outcome: patient tolerated procedure well   Post-procedure details: sterile dressing applied and wound care instructions given   Dressing type: bandage and petrolatum   R lateral hand Skin / nail biopsy Type of biopsy: tangential   Informed consent: discussed and consent obtained   Timeout: patient name, date of birth, surgical  site, and procedure verified   Procedure prep:  Patient was prepped and draped in usual sterile fashion Prep type:  Isopropyl  alcohol Anesthesia: the lesion was anesthetized in a standard fashion   Anesthetic:  1% lidocaine w/ epinephrine 1-100,000 buffered w/ 8.4% NaHCO3 Instrument used: DermaBlade   Hemostasis achieved with: pressure and aluminum chloride   Outcome: patient tolerated procedure well   Post-procedure details: sterile dressing applied and wound care instructions given   Dressing type: bandage and petrolatum    Return if symptoms worsen or fail to improve, pending biopsies results.  Wynonia Lawman, CMA, am acting as scribe for Elie Goody, MD .   Documentation: I have reviewed the above documentation for accuracy and completeness, and I agree with the above.  Elie Goody, MD

## 2023-10-17 NOTE — Patient Instructions (Addendum)

## 2023-10-18 DIAGNOSIS — D099 Carcinoma in situ, unspecified: Secondary | ICD-10-CM

## 2023-10-18 HISTORY — DX: Carcinoma in situ, unspecified: D09.9

## 2023-10-18 LAB — SURGICAL PATHOLOGY

## 2023-10-24 ENCOUNTER — Telehealth: Payer: Self-pay

## 2023-10-24 MED ORDER — FLUOROURACIL 5 % EX CREA: TOPICAL_CREAM | Freq: Two times a day (BID) | CUTANEOUS | 2 refills | Status: DC

## 2023-10-24 NOTE — Telephone Encounter (Signed)
 Patient advised pathology results showed SCCIs. Patient will treat with topical 5FU/calcipotriene, sent to [place]. Butch Penny., RMA

## 2023-10-24 NOTE — Telephone Encounter (Signed)
-----   Message from [facility] sent at 10/18/2023  9:07 PM EDT ----- Diagnosis 1. Skin, vertex scalp :       SQUAMOUS CELL CARCINOMA IN SITU, BASE INVOLVED        2. Skin, R lateral hand :       SQUAMOUS CELL CARCINOMA IN SITU     Please call with diagnosis, recommend 5FU/calcipotriene, make follow up for recheck in 3 months. If patient doesn't want to do the cream, offer EDC or Mohs   Explanation: Biopsies from your scalp and right hand show squamous cell skin cancers limited to the top layer of skin. They have the potential to spread beyond the skin and threaten your health, so we recommend treating them.   As we discussed during your appointment, the best way to treat your skin cancers and other sun damage is fluorouracil/calcipotriene cream. It helps your immune system clear the skin cancers and sun damage. It will cause redness and irritation. Wait two weeks after the biopsies to start applying the cream. Apply the cream to the backs of your hands and top of your scalp twice per day until redness and irritation develop (usually occurs by day 7), then stop and allow it to heal. Start with a small area (1/3 of the back of your hand) to get a sense of the reaction before treating larger areas. The cream is $45 plus shipping and will be mailed to you from a low cost compounding pharmacy.

## 2023-11-05 ENCOUNTER — Ambulatory Visit
Admission: RE | Admit: 2023-11-05 | Discharge: 2023-11-05 | Disposition: A | Discharge status: Home / Self Care | Payer: PPO | Source: Ambulatory Visit | Attending: Radiation Oncology | Admitting: Radiation Oncology

## 2023-11-05 DIAGNOSIS — C3412 Malignant neoplasm of upper lobe, left bronchus or lung: Secondary | ICD-10-CM | POA: Insufficient documentation

## 2023-11-05 MED ORDER — IOHEXOL 300 MG/ML  SOLN
75.0000 mL | Freq: Once | INTRAMUSCULAR | Status: AC | PRN
  Administered 2023-11-05: 75 mL via INTRAVENOUS

## 2023-11-14 ENCOUNTER — Other Ambulatory Visit: Payer: Self-pay | Admitting: *Deleted

## 2023-11-14 ENCOUNTER — Ambulatory Visit
Admission: RE | Admit: 2023-11-14 | Discharge: 2023-11-14 | Disposition: A | Discharge status: Home / Self Care | Payer: PPO | Source: Ambulatory Visit | Attending: Radiation Oncology | Admitting: Radiation Oncology

## 2023-11-14 ENCOUNTER — Encounter: Payer: Self-pay | Admitting: Radiation Oncology

## 2023-11-14 VITALS — BP 171/104 | HR 74 | Temp 97.0°F | Resp 18 | Ht 69.0 in | Wt 205.2 lb

## 2023-11-14 DIAGNOSIS — I7122 Aneurysm of the aortic arch, without rupture: Secondary | ICD-10-CM | POA: Diagnosis not present

## 2023-11-14 DIAGNOSIS — C3412 Malignant neoplasm of upper lobe, left bronchus or lung: Secondary | ICD-10-CM | POA: Diagnosis not present

## 2023-11-14 DIAGNOSIS — Z923 Personal history of irradiation: Secondary | ICD-10-CM | POA: Diagnosis not present

## 2023-11-14 DIAGNOSIS — F17210 Nicotine dependence, cigarettes, uncomplicated: Secondary | ICD-10-CM | POA: Diagnosis not present

## 2023-11-14 NOTE — Progress Notes (Signed)
 Radiation Oncology Follow up Note  Name: Roger Black   Date:   11/14/2023 MRN:  [id number] DOB: June 15, 1956    This 68 y.o. male presents to the clinic today for 19-month follow-up status post SBRT to his left lung for non-small cell lung cancer.  REFERRING PROVIDER: [name], MD  HPI: Patient is a 68 year old male now at 4 months having completed SBRT to his left lung for presumed stage I non-small cell lung cancer.  Seen today in routine follow-up he is doing well.  Specifically denies any change in his pulmonary status no cough hemoptysis chest tightness.  He had a recent CT scan of the chest.  Which I have reviewed showing slight decrease in the size of the treated stage Ia left apical primary bronchogenic carcinoma no evidence of progression or metastatic disease.  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  COMPLICATIONS OF TREATMENT: none  FOLLOW UP COMPLIANCE: keeps appointments   PHYSICAL EXAM:  BP (!) 171/104 Comment: Patient states that PCP has been adjusting BP medication  Pulse 74   Temp (!) 97 F (36.1 C)   Resp 18   Ht 5\' 9"  (1.753 m)   Wt 205 lb 3.2 oz (93.1 kg)   BMI 30.30 kg/m  Well-developed well-nourished patient in NAD. HEENT reveals PERLA, EOMI, discs not visualized.  Oral cavity is clear. No oral mucosal lesions are identified. Neck is clear without evidence of cervical or supraclavicular adenopathy. Lungs are clear to A&P. Cardiac examination is essentially unremarkable with regular rate and rhythm without murmur rub or thrill. Abdomen is benign with no organomegaly or masses noted. Motor sensory and DTR levels are equal and symmetric in the upper and lower extremities. Cranial nerves II through XII are grossly intact. Proprioception is intact. No peripheral adenopathy or edema is identified. No motor or sensory levels are noted. Crude visual fields are within normal range.  RADIOLOGY RESULTS: CT scan  reviewed compatible with above-stated findings  PLAN: The present time patient is doing well he is asymptomatic with no significant side effects from SBRT.  I am pleased with his overall progress and his CT scan results.  I have asked to see him back in 6 months with a follow-up CT scan at that time.  Patient knows to call with any concerns.  I would like to take this opportunity to thank you for allowing me to participate in the care of your patient.Glenis Langdon, MD

## 2023-11-22 DIAGNOSIS — C3412 Malignant neoplasm of upper lobe, left bronchus or lung: Secondary | ICD-10-CM | POA: Diagnosis not present

## 2023-11-22 DIAGNOSIS — R252 Cramp and spasm: Secondary | ICD-10-CM | POA: Diagnosis not present

## 2023-11-22 DIAGNOSIS — R7303 Prediabetes: Secondary | ICD-10-CM | POA: Diagnosis not present

## 2023-11-22 DIAGNOSIS — I10 Essential (primary) hypertension: Secondary | ICD-10-CM | POA: Diagnosis not present

## 2023-11-22 DIAGNOSIS — E7849 Other hyperlipidemia: Secondary | ICD-10-CM | POA: Diagnosis not present

## 2023-11-22 DIAGNOSIS — I2510 Atherosclerotic heart disease of native coronary artery without angina pectoris: Secondary | ICD-10-CM | POA: Diagnosis not present

## 2023-11-22 DIAGNOSIS — I700 Atherosclerosis of aorta: Secondary | ICD-10-CM | POA: Diagnosis not present

## 2023-12-04 DIAGNOSIS — I2510 Atherosclerotic heart disease of native coronary artery without angina pectoris: Secondary | ICD-10-CM | POA: Diagnosis not present

## 2023-12-04 DIAGNOSIS — M9931 Osseous stenosis of neural canal of cervical region: Secondary | ICD-10-CM | POA: Diagnosis not present

## 2023-12-04 DIAGNOSIS — M542 Cervicalgia: Secondary | ICD-10-CM | POA: Diagnosis not present

## 2023-12-04 DIAGNOSIS — M25512 Pain in left shoulder: Secondary | ICD-10-CM | POA: Diagnosis not present

## 2023-12-04 DIAGNOSIS — M5412 Radiculopathy, cervical region: Secondary | ICD-10-CM | POA: Diagnosis not present

## 2023-12-04 DIAGNOSIS — R3129 Other microscopic hematuria: Secondary | ICD-10-CM | POA: Diagnosis not present

## 2023-12-04 DIAGNOSIS — C3412 Malignant neoplasm of upper lobe, left bronchus or lung: Secondary | ICD-10-CM | POA: Diagnosis not present

## 2023-12-04 DIAGNOSIS — I700 Atherosclerosis of aorta: Secondary | ICD-10-CM | POA: Diagnosis not present

## 2023-12-04 DIAGNOSIS — G4733 Obstructive sleep apnea (adult) (pediatric): Secondary | ICD-10-CM | POA: Diagnosis not present

## 2023-12-04 DIAGNOSIS — R195 Other fecal abnormalities: Secondary | ICD-10-CM | POA: Diagnosis not present

## 2023-12-04 DIAGNOSIS — I10 Essential (primary) hypertension: Secondary | ICD-10-CM | POA: Diagnosis not present

## 2023-12-04 DIAGNOSIS — Z87891 Personal history of nicotine dependence: Secondary | ICD-10-CM | POA: Diagnosis not present

## 2023-12-25 DIAGNOSIS — I10 Essential (primary) hypertension: Secondary | ICD-10-CM | POA: Diagnosis not present

## 2023-12-25 DIAGNOSIS — R3129 Other microscopic hematuria: Secondary | ICD-10-CM | POA: Diagnosis not present

## 2023-12-25 DIAGNOSIS — G4733 Obstructive sleep apnea (adult) (pediatric): Secondary | ICD-10-CM | POA: Diagnosis not present

## 2023-12-25 DIAGNOSIS — Z125 Encounter for screening for malignant neoplasm of prostate: Secondary | ICD-10-CM | POA: Diagnosis not present

## 2023-12-25 DIAGNOSIS — R7303 Prediabetes: Secondary | ICD-10-CM | POA: Diagnosis not present

## 2023-12-25 DIAGNOSIS — E7849 Other hyperlipidemia: Secondary | ICD-10-CM | POA: Diagnosis not present

## 2024-01-01 DIAGNOSIS — M109 Gout, unspecified: Secondary | ICD-10-CM | POA: Diagnosis not present

## 2024-01-01 DIAGNOSIS — I10 Essential (primary) hypertension: Secondary | ICD-10-CM | POA: Diagnosis not present

## 2024-01-01 DIAGNOSIS — G4733 Obstructive sleep apnea (adult) (pediatric): Secondary | ICD-10-CM | POA: Diagnosis not present

## 2024-01-01 DIAGNOSIS — R3129 Other microscopic hematuria: Secondary | ICD-10-CM | POA: Diagnosis not present

## 2024-01-01 DIAGNOSIS — C3412 Malignant neoplasm of upper lobe, left bronchus or lung: Secondary | ICD-10-CM | POA: Diagnosis not present

## 2024-01-01 DIAGNOSIS — M1992 Post-traumatic osteoarthritis, unspecified site: Secondary | ICD-10-CM | POA: Diagnosis not present

## 2024-01-01 DIAGNOSIS — I700 Atherosclerosis of aorta: Secondary | ICD-10-CM | POA: Diagnosis not present

## 2024-01-01 DIAGNOSIS — Z0000 Encounter for general adult medical examination without abnormal findings: Secondary | ICD-10-CM | POA: Diagnosis not present

## 2024-01-01 DIAGNOSIS — R7303 Prediabetes: Secondary | ICD-10-CM | POA: Diagnosis not present

## 2024-01-01 DIAGNOSIS — Z1331 Encounter for screening for depression: Secondary | ICD-10-CM | POA: Diagnosis not present

## 2024-01-01 DIAGNOSIS — E7849 Other hyperlipidemia: Secondary | ICD-10-CM | POA: Diagnosis not present

## 2024-01-01 DIAGNOSIS — D649 Anemia, unspecified: Secondary | ICD-10-CM | POA: Diagnosis not present

## 2024-01-15 DIAGNOSIS — D649 Anemia, unspecified: Secondary | ICD-10-CM | POA: Diagnosis not present

## 2024-01-29 DIAGNOSIS — M25512 Pain in left shoulder: Secondary | ICD-10-CM | POA: Diagnosis not present

## 2024-01-29 DIAGNOSIS — M7542 Impingement syndrome of left shoulder: Secondary | ICD-10-CM | POA: Diagnosis not present

## 2024-01-29 DIAGNOSIS — M7552 Bursitis of left shoulder: Secondary | ICD-10-CM | POA: Diagnosis not present

## 2024-01-29 DIAGNOSIS — M778 Other enthesopathies, not elsewhere classified: Secondary | ICD-10-CM | POA: Diagnosis not present

## 2024-02-06 DIAGNOSIS — I10 Essential (primary) hypertension: Secondary | ICD-10-CM | POA: Diagnosis not present

## 2024-02-06 DIAGNOSIS — R600 Localized edema: Secondary | ICD-10-CM | POA: Diagnosis not present

## 2024-02-06 DIAGNOSIS — G479 Sleep disorder, unspecified: Secondary | ICD-10-CM | POA: Diagnosis not present

## 2024-02-06 DIAGNOSIS — C3492 Malignant neoplasm of unspecified part of left bronchus or lung: Secondary | ICD-10-CM | POA: Insufficient documentation

## 2024-02-06 DIAGNOSIS — J439 Emphysema, unspecified: Secondary | ICD-10-CM | POA: Diagnosis not present

## 2024-02-13 ENCOUNTER — Ambulatory Visit
Admission: RE | Admit: 2024-02-13 | Discharge: 2024-02-13 | Disposition: A | Discharge status: Home / Self Care | Source: Ambulatory Visit | Attending: Pulmonary Disease | Admitting: Pulmonary Disease

## 2024-02-13 ENCOUNTER — Other Ambulatory Visit: Payer: Self-pay | Admitting: Pulmonary Disease

## 2024-02-13 DIAGNOSIS — J449 Chronic obstructive pulmonary disease, unspecified: Secondary | ICD-10-CM | POA: Diagnosis not present

## 2024-02-13 DIAGNOSIS — Z87891 Personal history of nicotine dependence: Secondary | ICD-10-CM | POA: Diagnosis not present

## 2024-02-13 DIAGNOSIS — R9389 Abnormal findings on diagnostic imaging of other specified body structures: Secondary | ICD-10-CM | POA: Insufficient documentation

## 2024-02-13 DIAGNOSIS — R600 Localized edema: Secondary | ICD-10-CM | POA: Diagnosis not present

## 2024-02-13 DIAGNOSIS — C3412 Malignant neoplasm of upper lobe, left bronchus or lung: Secondary | ICD-10-CM | POA: Diagnosis not present

## 2024-02-28 DIAGNOSIS — C3492 Malignant neoplasm of unspecified part of left bronchus or lung: Secondary | ICD-10-CM | POA: Diagnosis not present

## 2024-02-28 DIAGNOSIS — R7303 Prediabetes: Secondary | ICD-10-CM | POA: Diagnosis not present

## 2024-02-28 DIAGNOSIS — C3412 Malignant neoplasm of upper lobe, left bronchus or lung: Secondary | ICD-10-CM | POA: Diagnosis not present

## 2024-02-28 DIAGNOSIS — I700 Atherosclerosis of aorta: Secondary | ICD-10-CM | POA: Diagnosis not present

## 2024-02-28 DIAGNOSIS — R600 Localized edema: Secondary | ICD-10-CM | POA: Diagnosis not present

## 2024-02-28 DIAGNOSIS — R140 Abdominal distension (gaseous): Secondary | ICD-10-CM | POA: Diagnosis not present

## 2024-02-28 DIAGNOSIS — J439 Emphysema, unspecified: Secondary | ICD-10-CM | POA: Diagnosis not present

## 2024-02-28 DIAGNOSIS — I10 Essential (primary) hypertension: Secondary | ICD-10-CM | POA: Diagnosis not present

## 2024-03-03 DIAGNOSIS — M25512 Pain in left shoulder: Secondary | ICD-10-CM | POA: Diagnosis not present

## 2024-03-04 DIAGNOSIS — R600 Localized edema: Secondary | ICD-10-CM | POA: Diagnosis not present

## 2024-03-06 DIAGNOSIS — C3492 Malignant neoplasm of unspecified part of left bronchus or lung: Secondary | ICD-10-CM | POA: Diagnosis not present

## 2024-03-06 DIAGNOSIS — R600 Localized edema: Secondary | ICD-10-CM | POA: Diagnosis not present

## 2024-03-06 DIAGNOSIS — J439 Emphysema, unspecified: Secondary | ICD-10-CM | POA: Diagnosis not present

## 2024-03-06 DIAGNOSIS — R7303 Prediabetes: Secondary | ICD-10-CM | POA: Diagnosis not present

## 2024-03-06 DIAGNOSIS — G4733 Obstructive sleep apnea (adult) (pediatric): Secondary | ICD-10-CM | POA: Diagnosis not present

## 2024-03-06 DIAGNOSIS — C3412 Malignant neoplasm of upper lobe, left bronchus or lung: Secondary | ICD-10-CM | POA: Diagnosis not present

## 2024-03-06 DIAGNOSIS — I10 Essential (primary) hypertension: Secondary | ICD-10-CM | POA: Diagnosis not present

## 2024-03-11 DIAGNOSIS — M25512 Pain in left shoulder: Secondary | ICD-10-CM | POA: Diagnosis not present

## 2024-03-11 DIAGNOSIS — I5020 Unspecified systolic (congestive) heart failure: Secondary | ICD-10-CM | POA: Insufficient documentation

## 2024-03-11 DIAGNOSIS — I10 Essential (primary) hypertension: Secondary | ICD-10-CM | POA: Diagnosis not present

## 2024-03-13 DIAGNOSIS — C3412 Malignant neoplasm of upper lobe, left bronchus or lung: Secondary | ICD-10-CM | POA: Diagnosis not present

## 2024-03-13 DIAGNOSIS — G4733 Obstructive sleep apnea (adult) (pediatric): Secondary | ICD-10-CM | POA: Diagnosis not present

## 2024-03-13 DIAGNOSIS — I700 Atherosclerosis of aorta: Secondary | ICD-10-CM | POA: Diagnosis not present

## 2024-03-13 DIAGNOSIS — J439 Emphysema, unspecified: Secondary | ICD-10-CM | POA: Diagnosis not present

## 2024-03-13 DIAGNOSIS — M25512 Pain in left shoulder: Secondary | ICD-10-CM | POA: Diagnosis not present

## 2024-03-13 DIAGNOSIS — R7303 Prediabetes: Secondary | ICD-10-CM | POA: Diagnosis not present

## 2024-03-13 DIAGNOSIS — I5020 Unspecified systolic (congestive) heart failure: Secondary | ICD-10-CM | POA: Diagnosis not present

## 2024-03-13 DIAGNOSIS — E7849 Other hyperlipidemia: Secondary | ICD-10-CM | POA: Diagnosis not present

## 2024-03-13 DIAGNOSIS — C3492 Malignant neoplasm of unspecified part of left bronchus or lung: Secondary | ICD-10-CM | POA: Diagnosis not present

## 2024-03-13 DIAGNOSIS — I10 Essential (primary) hypertension: Secondary | ICD-10-CM | POA: Diagnosis not present

## 2024-03-18 DIAGNOSIS — M25512 Pain in left shoulder: Secondary | ICD-10-CM | POA: Diagnosis not present

## 2024-03-20 DIAGNOSIS — M25512 Pain in left shoulder: Secondary | ICD-10-CM | POA: Diagnosis not present

## 2024-03-25 DIAGNOSIS — M25512 Pain in left shoulder: Secondary | ICD-10-CM | POA: Diagnosis not present

## 2024-03-27 DIAGNOSIS — M25512 Pain in left shoulder: Secondary | ICD-10-CM | POA: Diagnosis not present

## 2024-04-01 DIAGNOSIS — M25512 Pain in left shoulder: Secondary | ICD-10-CM | POA: Diagnosis not present

## 2024-04-03 DIAGNOSIS — M25512 Pain in left shoulder: Secondary | ICD-10-CM | POA: Diagnosis not present

## 2024-04-03 DIAGNOSIS — J439 Emphysema, unspecified: Secondary | ICD-10-CM | POA: Diagnosis not present

## 2024-04-03 DIAGNOSIS — E7849 Other hyperlipidemia: Secondary | ICD-10-CM | POA: Diagnosis not present

## 2024-04-03 DIAGNOSIS — I10 Essential (primary) hypertension: Secondary | ICD-10-CM | POA: Diagnosis not present

## 2024-04-03 DIAGNOSIS — G4733 Obstructive sleep apnea (adult) (pediatric): Secondary | ICD-10-CM | POA: Diagnosis not present

## 2024-04-03 DIAGNOSIS — C3492 Malignant neoplasm of unspecified part of left bronchus or lung: Secondary | ICD-10-CM | POA: Diagnosis not present

## 2024-04-04 ENCOUNTER — Other Ambulatory Visit: Payer: Self-pay | Admitting: Medical Genetics

## 2024-04-08 DIAGNOSIS — M25512 Pain in left shoulder: Secondary | ICD-10-CM | POA: Diagnosis not present

## 2024-04-10 DIAGNOSIS — R7303 Prediabetes: Secondary | ICD-10-CM | POA: Diagnosis not present

## 2024-04-10 DIAGNOSIS — I10 Essential (primary) hypertension: Secondary | ICD-10-CM | POA: Diagnosis not present

## 2024-04-10 DIAGNOSIS — M25512 Pain in left shoulder: Secondary | ICD-10-CM | POA: Diagnosis not present

## 2024-04-10 DIAGNOSIS — I700 Atherosclerosis of aorta: Secondary | ICD-10-CM | POA: Diagnosis not present

## 2024-04-10 DIAGNOSIS — M5416 Radiculopathy, lumbar region: Secondary | ICD-10-CM | POA: Diagnosis not present

## 2024-04-10 DIAGNOSIS — J439 Emphysema, unspecified: Secondary | ICD-10-CM | POA: Diagnosis not present

## 2024-04-10 DIAGNOSIS — I5020 Unspecified systolic (congestive) heart failure: Secondary | ICD-10-CM | POA: Diagnosis not present

## 2024-04-10 DIAGNOSIS — E7849 Other hyperlipidemia: Secondary | ICD-10-CM | POA: Diagnosis not present

## 2024-04-10 DIAGNOSIS — C3492 Malignant neoplasm of unspecified part of left bronchus or lung: Secondary | ICD-10-CM | POA: Diagnosis not present

## 2024-04-10 DIAGNOSIS — C3412 Malignant neoplasm of upper lobe, left bronchus or lung: Secondary | ICD-10-CM | POA: Diagnosis not present

## 2024-04-15 DIAGNOSIS — M25512 Pain in left shoulder: Secondary | ICD-10-CM | POA: Diagnosis not present

## 2024-04-17 DIAGNOSIS — M25512 Pain in left shoulder: Secondary | ICD-10-CM | POA: Diagnosis not present

## 2024-04-23 ENCOUNTER — Other Ambulatory Visit: Payer: Self-pay | Admitting: Physical Medicine & Rehabilitation

## 2024-04-23 DIAGNOSIS — M542 Cervicalgia: Secondary | ICD-10-CM | POA: Diagnosis not present

## 2024-04-23 DIAGNOSIS — M5442 Lumbago with sciatica, left side: Secondary | ICD-10-CM | POA: Diagnosis not present

## 2024-04-23 DIAGNOSIS — M160 Bilateral primary osteoarthritis of hip: Secondary | ICD-10-CM | POA: Diagnosis not present

## 2024-04-23 DIAGNOSIS — M9931 Osseous stenosis of neural canal of cervical region: Secondary | ICD-10-CM | POA: Diagnosis not present

## 2024-04-23 DIAGNOSIS — G8929 Other chronic pain: Secondary | ICD-10-CM | POA: Diagnosis not present

## 2024-04-23 DIAGNOSIS — M5441 Lumbago with sciatica, right side: Secondary | ICD-10-CM | POA: Diagnosis not present

## 2024-04-23 DIAGNOSIS — M5412 Radiculopathy, cervical region: Secondary | ICD-10-CM | POA: Diagnosis not present

## 2024-04-23 DIAGNOSIS — M5416 Radiculopathy, lumbar region: Secondary | ICD-10-CM | POA: Diagnosis not present

## 2024-04-23 DIAGNOSIS — M25512 Pain in left shoulder: Secondary | ICD-10-CM | POA: Diagnosis not present

## 2024-04-24 DIAGNOSIS — M25512 Pain in left shoulder: Secondary | ICD-10-CM | POA: Diagnosis not present

## 2024-04-25 ENCOUNTER — Ambulatory Visit
Admission: RE | Admit: 2024-04-25 | Discharge: 2024-04-25 | Disposition: A | Discharge status: Home / Self Care | Source: Ambulatory Visit | Attending: Physical Medicine & Rehabilitation | Admitting: Physical Medicine & Rehabilitation

## 2024-04-25 ENCOUNTER — Other Ambulatory Visit: Payer: Self-pay | Admitting: Physical Medicine & Rehabilitation

## 2024-04-25 DIAGNOSIS — M5441 Lumbago with sciatica, right side: Secondary | ICD-10-CM

## 2024-04-25 DIAGNOSIS — G8929 Other chronic pain: Secondary | ICD-10-CM

## 2024-04-25 MED ORDER — GADOPICLENOL 0.5 MMOL/ML IV SOLN
10.0000 mL | Freq: Once | INTRAVENOUS | Status: AC | PRN
  Administered 2024-04-25: 10 mL via INTRAVENOUS

## 2024-04-28 ENCOUNTER — Inpatient Hospital Stay
Admission: EM | Admit: 2024-04-28 | Discharge: 2024-05-07 | DRG: 054 | Disposition: A | Discharge status: Other Acute Inpatient Hospital | Source: Other Acute Inpatient Hospital | Attending: Student in an Organized Health Care Education/Training Program | Admitting: Student in an Organized Health Care Education/Training Program

## 2024-04-28 ENCOUNTER — Other Ambulatory Visit: Payer: Self-pay

## 2024-04-28 DIAGNOSIS — I2510 Atherosclerotic heart disease of native coronary artery without angina pectoris: Secondary | ICD-10-CM | POA: Diagnosis not present

## 2024-04-28 DIAGNOSIS — Z6831 Body mass index (BMI) 31.0-31.9, adult: Secondary | ICD-10-CM

## 2024-04-28 DIAGNOSIS — R937 Abnormal findings on diagnostic imaging of other parts of musculoskeletal system: Secondary | ICD-10-CM

## 2024-04-28 DIAGNOSIS — N200 Calculus of kidney: Secondary | ICD-10-CM | POA: Diagnosis not present

## 2024-04-28 DIAGNOSIS — G936 Cerebral edema: Secondary | ICD-10-CM | POA: Diagnosis present

## 2024-04-28 DIAGNOSIS — M5442 Lumbago with sciatica, left side: Secondary | ICD-10-CM | POA: Diagnosis not present

## 2024-04-28 DIAGNOSIS — G039 Meningitis, unspecified: Secondary | ICD-10-CM | POA: Diagnosis not present

## 2024-04-28 DIAGNOSIS — I10 Essential (primary) hypertension: Secondary | ICD-10-CM | POA: Diagnosis present

## 2024-04-28 DIAGNOSIS — G959 Disease of spinal cord, unspecified: Secondary | ICD-10-CM | POA: Diagnosis not present

## 2024-04-28 DIAGNOSIS — E785 Hyperlipidemia, unspecified: Secondary | ICD-10-CM | POA: Diagnosis not present

## 2024-04-28 DIAGNOSIS — C7932 Secondary malignant neoplasm of cerebral meninges: Secondary | ICD-10-CM | POA: Diagnosis not present

## 2024-04-28 DIAGNOSIS — Z85118 Personal history of other malignant neoplasm of bronchus and lung: Secondary | ICD-10-CM | POA: Diagnosis not present

## 2024-04-28 DIAGNOSIS — M5441 Lumbago with sciatica, right side: Secondary | ICD-10-CM | POA: Diagnosis not present

## 2024-04-28 DIAGNOSIS — E66811 Obesity, class 1: Secondary | ICD-10-CM | POA: Diagnosis present

## 2024-04-28 DIAGNOSIS — I700 Atherosclerosis of aorta: Secondary | ICD-10-CM | POA: Diagnosis present

## 2024-04-28 DIAGNOSIS — N281 Cyst of kidney, acquired: Secondary | ICD-10-CM | POA: Diagnosis not present

## 2024-04-28 DIAGNOSIS — M79606 Pain in leg, unspecified: Secondary | ICD-10-CM | POA: Diagnosis present

## 2024-04-28 DIAGNOSIS — M549 Dorsalgia, unspecified: Secondary | ICD-10-CM | POA: Diagnosis present

## 2024-04-28 DIAGNOSIS — Z87891 Personal history of nicotine dependence: Secondary | ICD-10-CM

## 2024-04-28 DIAGNOSIS — J4489 Other specified chronic obstructive pulmonary disease: Secondary | ICD-10-CM | POA: Diagnosis present

## 2024-04-28 DIAGNOSIS — C7931 Secondary malignant neoplasm of brain: Secondary | ICD-10-CM | POA: Diagnosis present

## 2024-04-28 DIAGNOSIS — J439 Emphysema, unspecified: Secondary | ICD-10-CM | POA: Diagnosis present

## 2024-04-28 DIAGNOSIS — J449 Chronic obstructive pulmonary disease, unspecified: Secondary | ICD-10-CM | POA: Diagnosis present

## 2024-04-28 DIAGNOSIS — M79605 Pain in left leg: Secondary | ICD-10-CM

## 2024-04-28 DIAGNOSIS — M792 Neuralgia and neuritis, unspecified: Secondary | ICD-10-CM | POA: Diagnosis not present

## 2024-04-28 DIAGNOSIS — Z923 Personal history of irradiation: Secondary | ICD-10-CM

## 2024-04-28 DIAGNOSIS — C3490 Malignant neoplasm of unspecified part of unspecified bronchus or lung: Secondary | ICD-10-CM

## 2024-04-28 DIAGNOSIS — Z713 Dietary counseling and surveillance: Secondary | ICD-10-CM

## 2024-04-28 DIAGNOSIS — K219 Gastro-esophageal reflux disease without esophagitis: Secondary | ICD-10-CM | POA: Diagnosis present

## 2024-04-28 DIAGNOSIS — N1831 Chronic kidney disease, stage 3a: Secondary | ICD-10-CM | POA: Diagnosis present

## 2024-04-28 DIAGNOSIS — C3412 Malignant neoplasm of upper lobe, left bronchus or lung: Secondary | ICD-10-CM | POA: Diagnosis not present

## 2024-04-28 DIAGNOSIS — R000 Tachycardia, unspecified: Secondary | ICD-10-CM | POA: Diagnosis not present

## 2024-04-28 DIAGNOSIS — Z8249 Family history of ischemic heart disease and other diseases of the circulatory system: Secondary | ICD-10-CM

## 2024-04-28 DIAGNOSIS — I5022 Chronic systolic (congestive) heart failure: Secondary | ICD-10-CM | POA: Diagnosis present

## 2024-04-28 DIAGNOSIS — E669 Obesity, unspecified: Secondary | ICD-10-CM | POA: Diagnosis present

## 2024-04-28 DIAGNOSIS — M79604 Pain in right leg: Secondary | ICD-10-CM

## 2024-04-28 DIAGNOSIS — Z85828 Personal history of other malignant neoplasm of skin: Secondary | ICD-10-CM

## 2024-04-28 DIAGNOSIS — C7951 Secondary malignant neoplasm of bone: Secondary | ICD-10-CM | POA: Diagnosis present

## 2024-04-28 DIAGNOSIS — Z79899 Other long term (current) drug therapy: Secondary | ICD-10-CM

## 2024-04-28 DIAGNOSIS — I130 Hypertensive heart and chronic kidney disease with heart failure and stage 1 through stage 4 chronic kidney disease, or unspecified chronic kidney disease: Secondary | ICD-10-CM | POA: Diagnosis present

## 2024-04-28 DIAGNOSIS — R836 Abnormal cytological findings in cerebrospinal fluid: Secondary | ICD-10-CM | POA: Diagnosis not present

## 2024-04-28 DIAGNOSIS — R262 Difficulty in walking, not elsewhere classified: Secondary | ICD-10-CM | POA: Diagnosis present

## 2024-04-28 DIAGNOSIS — Z86008 Personal history of in-situ neoplasm of other site: Secondary | ICD-10-CM

## 2024-04-28 DIAGNOSIS — Z59868 Other specified financial insecurity: Secondary | ICD-10-CM

## 2024-04-28 DIAGNOSIS — Z7951 Long term (current) use of inhaled steroids: Secondary | ICD-10-CM

## 2024-04-28 DIAGNOSIS — F17210 Nicotine dependence, cigarettes, uncomplicated: Secondary | ICD-10-CM | POA: Diagnosis not present

## 2024-04-28 DIAGNOSIS — R9089 Other abnormal findings on diagnostic imaging of central nervous system: Secondary | ICD-10-CM | POA: Diagnosis not present

## 2024-04-28 HISTORY — DX: Chronic kidney disease, stage 3a: N18.31

## 2024-04-28 HISTORY — DX: Essential (primary) hypertension: I10

## 2024-04-28 HISTORY — DX: Chronic systolic (congestive) heart failure: I50.22

## 2024-04-28 LAB — CBC
HCT: 39.9 % (ref 39.0–52.0)
Hemoglobin: 13.3 g/dL (ref 13.0–17.0)
MCH: 33.3 pg (ref 26.0–34.0)
MCHC: 33.3 g/dL (ref 30.0–36.0)
MCV: 100 fL (ref 80.0–100.0)
Platelets: 160 K/uL (ref 150–400)
RBC: 3.99 MIL/uL — ABNORMAL LOW (ref 4.22–5.81)
RDW: 12.8 % (ref 11.5–15.5)
WBC: 6.9 K/uL (ref 4.0–10.5)
nRBC: 0 % (ref 0.0–0.2)

## 2024-04-28 LAB — COMPREHENSIVE METABOLIC PANEL WITH GFR
ALT: 21 U/L (ref 0–44)
AST: 26 U/L (ref 15–41)
Albumin: 4.3 g/dL (ref 3.5–5.0)
Alkaline Phosphatase: 66 U/L (ref 38–126)
Anion gap: 12 (ref 5–15)
BUN: 24 mg/dL — ABNORMAL HIGH (ref 8–23)
CO2: 20 mmol/L — ABNORMAL LOW (ref 22–32)
Calcium: 9.5 mg/dL (ref 8.9–10.3)
Chloride: 102 mmol/L (ref 98–111)
Creatinine, Ser: 1.36 mg/dL — ABNORMAL HIGH (ref 0.61–1.24)
GFR, Estimated: 57 mL/min — ABNORMAL LOW (ref >60)
Glucose, Bld: 120 mg/dL — ABNORMAL HIGH (ref 70–99)
Potassium: 4.8 mmol/L (ref 3.5–5.1)
Sodium: 134 mmol/L — ABNORMAL LOW (ref 135–145)
Total Bilirubin: 1 mg/dL (ref 0.0–1.2)
Total Protein: 7.6 g/dL (ref 6.5–8.1)

## 2024-04-28 LAB — BRAIN NATRIURETIC PEPTIDE: B Natriuretic Peptide: 7.4 pg/mL (ref 0.0–100.0)

## 2024-04-28 LAB — SEDIMENTATION RATE: Sed Rate: 18 mm/h (ref 0–20)

## 2024-04-28 LAB — C-REACTIVE PROTEIN: CRP: <0.5 mg/dL (ref <1.0)

## 2024-04-28 MED ORDER — SPIRONOLACTONE 25 MG PO TABS
50.0000 mg | ORAL_TABLET | Freq: Every day | ORAL | Status: DC
  Administered 2024-04-29 – 2024-05-04 (×6): 50 mg via ORAL
  Filled 2024-04-28 (×6): qty 2

## 2024-04-28 MED ORDER — OXYCODONE-ACETAMINOPHEN 5-325 MG PO TABS
1.0000 | ORAL_TABLET | ORAL | Status: DC | PRN
  Administered 2024-04-28 – 2024-05-03 (×13): 1 via ORAL
  Filled 2024-04-28 (×13): qty 1

## 2024-04-28 MED ORDER — OXYCODONE-ACETAMINOPHEN 5-325 MG PO TABS
1.0000 | ORAL_TABLET | Freq: Once | ORAL | Status: AC
  Administered 2024-04-28: 1 via ORAL
  Filled 2024-04-28: qty 1

## 2024-04-28 MED ORDER — NICOTINE 21 MG/24HR TD PT24: 21.0000 mg | MEDICATED_PATCH | Freq: Every day | TRANSDERMAL | Status: DC

## 2024-04-28 MED ORDER — ZOLPIDEM TARTRATE 5 MG PO TABS
5.0000 mg | ORAL_TABLET | Freq: Every evening | ORAL | Status: DC | PRN
Start: 2024-04-28 — End: 2024-05-07
  Administered 2024-05-02 – 2024-05-03 (×2): 5 mg via ORAL
  Filled 2024-04-28 (×2): qty 1

## 2024-04-28 MED ORDER — GABAPENTIN 300 MG PO CAPS
600.0000 mg | ORAL_CAPSULE | Freq: Three times a day (TID) | ORAL | Status: DC
  Administered 2024-04-28 – 2024-04-30 (×5): 600 mg via ORAL
  Filled 2024-04-28 (×5): qty 2

## 2024-04-28 MED ORDER — ATORVASTATIN CALCIUM 20 MG PO TABS
80.0000 mg | ORAL_TABLET | Freq: Every day | ORAL | Status: DC
  Administered 2024-04-28 – 2024-05-06 (×9): 80 mg via ORAL
  Filled 2024-04-28 (×9): qty 4

## 2024-04-28 MED ORDER — METHOCARBAMOL 500 MG PO TABS
500.0000 mg | ORAL_TABLET | Freq: Three times a day (TID) | ORAL | Status: DC | PRN
  Administered 2024-04-29 – 2024-05-02 (×7): 500 mg via ORAL
  Filled 2024-04-28 (×8): qty 1

## 2024-04-28 MED ORDER — LIDOCAINE 5 % EX PTCH
1.0000 | MEDICATED_PATCH | CUTANEOUS | Status: DC
  Administered 2024-04-28 – 2024-04-29 (×2): 1 via TRANSDERMAL
  Filled 2024-04-28 (×9): qty 1

## 2024-04-28 MED ORDER — PANTOPRAZOLE SODIUM 40 MG PO TBEC
40.0000 mg | DELAYED_RELEASE_TABLET | Freq: Every day | ORAL | Status: DC
  Administered 2024-04-29 – 2024-05-06 (×8): 40 mg via ORAL
  Filled 2024-04-28 (×8): qty 1

## 2024-04-28 MED ORDER — MORPHINE SULFATE (PF) 4 MG/ML IV SOLN
4.0000 mg | Freq: Once | INTRAVENOUS | Status: AC
  Administered 2024-04-28: 4 mg via INTRAVENOUS
  Filled 2024-04-28: qty 1

## 2024-04-28 MED ORDER — DOXAZOSIN MESYLATE 2 MG PO TABS
2.0000 mg | ORAL_TABLET | Freq: Every day | ORAL | Status: DC
Start: 2024-04-28 — End: 2024-05-07
  Administered 2024-04-29 – 2024-05-06 (×7): 2 mg via ORAL
  Filled 2024-04-28 (×10): qty 1

## 2024-04-28 MED ORDER — MONTELUKAST SODIUM 10 MG PO TABS
10.0000 mg | ORAL_TABLET | Freq: Every day | ORAL | Status: DC
  Administered 2024-04-28 – 2024-05-06 (×9): 10 mg via ORAL
  Filled 2024-04-28 (×9): qty 1

## 2024-04-28 MED ORDER — HYDRALAZINE HCL 20 MG/ML IJ SOLN: 5.0000 mg | INTRAMUSCULAR | Status: DC | PRN

## 2024-04-28 MED ORDER — DM-GUAIFENESIN ER 30-600 MG PO TB12: 1.0000 | ORAL_TABLET | Freq: Two times a day (BID) | ORAL | Status: DC | PRN

## 2024-04-28 MED ORDER — IRBESARTAN 150 MG PO TABS
300.0000 mg | ORAL_TABLET | Freq: Every day | ORAL | Status: DC
  Administered 2024-04-29 – 2024-05-06 (×8): 300 mg via ORAL
  Filled 2024-04-28 (×8): qty 2

## 2024-04-28 MED ORDER — ALBUTEROL SULFATE HFA 108 (90 BASE) MCG/ACT IN AERS: 2.0000 | INHALATION_SPRAY | RESPIRATORY_TRACT | Status: DC | PRN

## 2024-04-28 MED ORDER — ACETAMINOPHEN 325 MG PO TABS
650.0000 mg | ORAL_TABLET | Freq: Four times a day (QID) | ORAL | Status: DC | PRN
  Administered 2024-05-06: 650 mg via ORAL
  Filled 2024-04-28: qty 2

## 2024-04-28 MED ORDER — MORPHINE SULFATE (PF) 2 MG/ML IV SOLN
2.0000 mg | INTRAVENOUS | Status: DC | PRN
  Administered 2024-04-29 – 2024-05-03 (×13): 2 mg via INTRAVENOUS
  Filled 2024-04-28 (×13): qty 1

## 2024-04-28 MED ORDER — ONDANSETRON HCL 4 MG/2ML IJ SOLN: 4.0000 mg | Freq: Three times a day (TID) | INTRAMUSCULAR | Status: DC | PRN

## 2024-04-28 MED ORDER — ALBUTEROL SULFATE (2.5 MG/3ML) 0.083% IN NEBU: 2.5000 mg | INHALATION_SOLUTION | RESPIRATORY_TRACT | Status: DC | PRN

## 2024-04-28 MED ORDER — OXYCODONE-ACETAMINOPHEN 5-325 MG PO TABS
1.0000 | ORAL_TABLET | ORAL | Status: DC | PRN
  Administered 2024-04-28: 1 via ORAL
  Filled 2024-04-28: qty 1

## 2024-04-28 MED ORDER — ASPIRIN 81 MG PO TBEC
81.0000 mg | DELAYED_RELEASE_TABLET | Freq: Every day | ORAL | Status: DC
Start: 2024-04-29 — End: 2024-05-07
  Administered 2024-04-29 – 2024-05-06 (×8): 81 mg via ORAL
  Filled 2024-04-28 (×8): qty 1

## 2024-04-28 MED ORDER — FLUTICASONE FUROATE-VILANTEROL 200-25 MCG/ACT IN AEPB
1.0000 | INHALATION_SPRAY | Freq: Every day | RESPIRATORY_TRACT | Status: DC
  Administered 2024-04-29 – 2024-05-06 (×8): 1 via RESPIRATORY_TRACT
  Filled 2024-04-28: qty 28

## 2024-04-28 NOTE — ED Notes (Signed)
 Pt was able to ambulate to the bathroom   Tolerated  well

## 2024-04-28 NOTE — ED Triage Notes (Signed)
 Says he has beenseeing [facility] for back pain.  Had mri on Friday.  Now they have told him to come here.  He denies back pain.  Says pain in both legs that is pulsating since Saturday.  Says he is unable to walk and hasn't slept since Friday due to pain.

## 2024-04-28 NOTE — H&P (Signed)
 History and Physical    Roger Black FMW:[id number] DOB: 1955/11/25 DOA: 04/28/2024  Referring MD/NP/PA:   PCP: [name], MD   Patient coming from:  The patient is coming from home.     Chief Complaint: leg pain  HPI: Roger Black is a 68 y.o. male with medical history significant of dCHF with EF 45%, HTN, HLD, CAD, former smoker, COPD/asthma, GERD, CKD-3, skin cancer, obesity, who presents with leg pain.  Pt states that he started having right leg pain about a month ago, which is initially located in the right upper and left lower leg, constant, severe, pulsatile pain, gradually down to the lower leg, now has whole right leg pain.  In the past several days, he developed pain in left upper leg with similar nature.  He reports occasional tingling in both legs.  No loss control of bladder or bowel movement.  Patient denies back pain to me.  He said he never had back pain.  No fever or chills.  Denies chest pain, cough, SOB.  No nausea vomiting, diarrhea or abdominal pain.  No symptoms of UTI.  Pt was seen by his PCP who ordered MRI-L [place] on 10/3, which showed multilevel stenosis with enhancement of several nerve roots of the cauda equina without nodular masslike enhancement which radiology notes may be secondary to underlying infectious or inflammatory process. Pt wasplaced on gabapentin and Norco with minimal relief.  Now patient cannot walk due to severe pain and mild leg weakness.  Patient denies recent insect bite.    MRI-L [place] on 10/3: 1. Multilevel lumbar spondylosis with broad-based disc bulging at L2-3, L3-4, and L4-5 causing mild-to-moderate central canal and lateral recess stenosis; no apparent nerve root impingement at L2-3 and L4-5. 2. Left-sided facet arthrosis at L5-S1 with widely patent canal and foramina. 3. Mild chronic compression deformities of T11 and T12 with mild focal kyphosis; mild posterior disc bulge at T11-12 causing mild central  canal stenosis. 4. ADDENDUM: The study was performed without and with intravenous statin contrast. There is enhancement of several nerve roots of the cauda equina. There is no nodular or mass-like enhancement. This may be secondary to an underlying infectious or inflammatory process    Data reviewed independently and ED Course: pt was found to have WBC 6.9, stable renal function, temperature normal, blood pressure 140/99, heart rate of 115 --> 90s, RR 18, oxygen  saturation 97% on room air.  Patient is placed in telemetry bed for observation.  Dr. Michaela of neurology is consulted.  ED physician also discussed with Dr. Katrina of neurosurgery.   EKG: Not done in ED, will get one.   Review of Systems:   General: no fevers, chills, no body weight gain, has fatigue HEENT: no blurry vision, hearing changes or sore throat Respiratory: no dyspnea, coughing, wheezing CV: no chest pain, no palpitations GI: no nausea, vomiting, abdominal pain, diarrhea, constipation GU: no dysuria, burning on urination, increased urinary frequency, hematuria  Ext: no leg edema Neuro: no unilateral weakness, numbness, or tingling, no vision change or hearing loss Skin: no rash, no skin tear. MSK: has bilateral leg pain Heme: No easy bruising.  Travel history: No recent long distant travel.   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 His smoking use included cigarettes. He has a 47 pack-year smoking history. He has never used smokeless tobacco. He reports that he does not drink alcohol  and does not use drugs.  Family History:  Family History  Problem Relation Age of Onset   Hypertension Mother    Hypertension Father      Prior to Admission medications   Medication Sig Start Date End Date Taking? Authorizing Provider  aspirin 81 MG EC tablet Take by mouth.   Yes [provider]  atorvastatin (LIPITOR) 80 MG tablet Take 80 mg by mouth daily. 08/19/19 04/28/24 Yes [provider]  budesonide-formoterol (SYMBICORT) 160-4.5 MCG/ACT inhaler Inhale 2 puffs into the lungs 2 (two) times daily.   Yes [provider]  gabapentin (NEURONTIN) 300 MG capsule Take 2 capsules by mouth 3 (three) times daily. 07/03/23  Yes [provider]  albuterol (VENTOLIN HFA) 108 (90 Base) MCG/ACT inhaler Inhale into the lungs. 06/07/20   [provider]  fluorouracil  (EFUDEX ) 5 % cream Apply topically 2 (two) times daily. Apply twice daily to aa scalp and back of hands until red and irritated, about day 7 10/24/23   Claudene Lehmann, MD  ipratropium (ATROVENT) 0.03 % nasal spray Place into the nose. 12/26/22 12/26/23  [provider]  levocetirizine (XYZAL) 5 MG tablet Take 5 mg by mouth at bedtime. 06/03/20   [provider]  lisinopril (ZESTRIL) 40 MG tablet  08/17/23   [provider]  montelukast (SINGULAIR) 10 MG tablet Take 10 mg by mouth daily. 06/03/20   [provider]  Multiple Vitamin (MULTI-VITAMIN)  tablet Take by mouth.    [provider]  pantoprazole (PROTONIX) 40 MG tablet Take by mouth. 12/03/19 10/17/23  [provider]    Physical Exam: Vitals:   04/28/24 1708 04/28/24 2225 04/28/24 2247 04/28/24 2330  BP: (!) 140/99 116/79  (!) 126/98  Pulse: 99 (!) 116  (!) 112  Resp: 18 18  18   Temp: 98 F (36.7 C)  98 F (36.7 C) 98.5 F (36.9 C)  TempSrc: Oral  Oral   SpO2: 97% 90%  94%  Weight:      Height:       General: Not in acute distress HEENT:       Eyes: PERRL, EOMI, no jaundice       ENT: No discharge from the ears and nose, no pharynx injection, no tonsillar enlargement.        Neck: No JVD, no bruit, no mass felt. Heme: No neck lymph node enlargement. Cardiac: S1/S2, RRR, No murmurs, No gallops or rubs. Respiratory: No rales, wheezing, rhonchi or rubs. GI: Soft, nondistended, nontender, no rebound pain, no organomegaly, BS present. GU: No hematuria Ext: No pitting leg  edema bilaterally. 1+DP/PT pulse bilaterally. Musculoskeletal: No joint deformities, No joint redness or warmth, no limitation of ROM in spin. Has tenderness in the right upper and lateral leg Skin: No rashes.  Neuro: Alert, oriented X3, cranial nerves II-XII grossly intact, moves all extremities normally. Psych: Patient is not psychotic, no suicidal or hemocidal ideation.  Labs on Admission: I have personally reviewed following labs and imaging studies  CBC: Recent Labs  Lab 04/28/24 1303 04/29/24 0056  WBC 6.9 5.7  HGB 13.3 12.6*  HCT 39.9 38.3*  MCV 100.0 100.5*  PLT 160 152   Basic Metabolic Panel: Recent Labs  Lab 04/28/24 1303 04/29/24 0056  NA 134* 135  K 4.8 4.9  CL 102 102  CO2 20* 26  GLUCOSE 120* 146*  BUN 24* 27*  CREATININE 1.36* 1.32*  CALCIUM 9.5 9.4   GFR: Estimated Creatinine Clearance: 61.2 mL/min (A) (by C-G formula based on SCr of 1.32 mg/dL (H)). Liver Function Tests: Recent Labs  Lab 04/28/24 1303  AST 26  ALT 21  ALKPHOS 66  BILITOT  1.0  PROT 7.6  ALBUMIN 4.3   No results for input(s): LIPASE, AMYLASE in the last 168 hours. No results for input(s): AMMONIA in the last 168 hours. Coagulation Profile: Recent Labs  Lab 04/29/24 0056  INR 1.1   Cardiac Enzymes: No results for input(s): CKTOTAL, CKMB, CKMBINDEX, TROPONINI in the last 168 hours. BNP (last 3 results) No results for input(s): PROBNP in the last 8760 hours. HbA1C: No results for input(s): HGBA1C in the last 72 hours. CBG: No results for input(s): GLUCAP in the last 168 hours. Lipid Profile: No results for input(s): CHOL, HDL, LDLCALC, TRIG, CHOLHDL, LDLDIRECT in the last 72 hours. Thyroid  Function Tests: No results for input(s): TSH, T4TOTAL, FREET4, T3FREE, THYROIDAB in the last 72 hours. Anemia Panel: No results for input(s): VITAMINB12, FOLATE, FERRITIN, TIBC, IRON, RETICCTPCT in the last 72 hours. Urine analysis: No results found for: COLORURINE, APPEARANCEUR, LABSPEC, PHURINE, GLUCOSEU, HGBUR, BILIRUBINUR, KETONESUR, PROTEINUR, UROBILINOGEN, NITRITE, LEUKOCYTESUR Sepsis Labs: @LABRCNTIP (procalcitonin:4,lacticidven:4) )No results found for this or any previous visit (from the past 240 hours).   Radiological Exams on Admission:   Assessment/Plan Principal Problem:   Leg pain Active Problems:   COPD (chronic obstructive pulmonary disease) (HCC)   HTN (hypertension)   Coronary artery disease   Hyperlipidemia   Chronic systolic CHF (congestive heart failure) (HCC)   Chronic kidney disease, stage 3a (HCC)   Obesity (BMI 30-39.9)   Assessment and Plan:  Leg pain: MRI-L [place] showed multilevel stenosis with enhancement of several nerve roots of the cauda equina without nodular masslike enhancement which radiology notes may be secondary to underlying infectious or inflammatory process. EDP discussed with Dr. Katrina of neurosurgery, who reviewed imaging and  recommended daytime LP to send for cytology and discussion with neurology team as he felt this was more likely a primary neurological as opposed to neurosurgical issue.  Dr. Michaela for neurology is consulted, who recommended to get lumbar puncture, cytology, Gram stain and culture.  -Place in telemetry bed for observation - As needed morphine, Percocet, Robaxin, Tylenol - Lidoderm patch - Fall precaution - Ordered lumbar puncture by IR  COPD (chronic obstructive pulmonary disease) (HCC): Stable - Bronchodilators and as needed Mucinex  HTN (hypertension) -IV hydralazine as needed - Irbesartan, spironolactone  Coronary artery disease -Aspirin, Lipitor  Hyperlipidemia -Lipitor  Chronic systolic CHF (congestive heart failure) (HCC): 2D echo on 03/04/2024 showed EF of 45%.  Patient does not have leg  edema.  No JVD. BNP 7.4  CHF seems to be compensated. -Continue spironolactone  Chronic kidney disease, stage 3a (HCC): Renal function stable -Follow-up with BMP  Obesity (BMI 30-39.9): Patient has Obesity Class I, with body weight 95.9 Kg and BMI 31.22 kg/m2.  - Encourage losing weight - Exercise and healthy diet     DVT ppx: SCD  Code Status: Full code  Family Communication:    Yes, patient's wife  at bed side.     Disposition Plan:  Anticipate discharge back to previous environment  Consults called:  Dr. Michaela of neurology is consulted.  ED physician also discussed with Dr. Katrina of neurosurgery.  Admission status and Level of care: Telemetry Medical:    for obs       Dispo: The patient is from: Home              Anticipated d/c is to: Home              Anticipated d/c date is: 1 day              Patient currently is not medically stable to d/c.    Severity of Illness:  The appropriate patient status for this patient is OBSERVATION. Observation status is judged to be reasonable and necessary in order to provide the required intensity of service to  ensure the patient's safety. The patient's presenting symptoms, physical exam findings, and initial radiographic and laboratory data in the context of their medical condition is felt to place them at decreased risk for further clinical deterioration. Furthermore, it is anticipated that the patient will be medically stable for discharge from the hospital within 2 midnights of admission.        Date of Service 04/29/2024    Caleb Exon [facility]   If 7PM-7AM, please contact night-coverage [email or web address] 04/29/2024, 1:39 AM

## 2024-04-28 NOTE — ED Notes (Signed)
 Resting at present  family at bedside Awaiting MD

## 2024-04-28 NOTE — ED Notes (Signed)
Pt is able to ambulate

## 2024-04-28 NOTE — ED Notes (Signed)
 See triage note Presents with pain to both hip areas  Pain is worse on the right  Has been seen by PCP  Placed on medication  Had x-rays done Then states he is having stinging to both legs  and it feels like legs are pulsating

## 2024-04-28 NOTE — ED Notes (Signed)
 Pt's oxygen  saturation decreased to 88% on room air while sleeping. Pt placed on 2L Short with oxygen  saturation increasing to 95%. PT states he used to wear oxygen  & has a sleep study scheduled later in the week. Denies any SOB, pain or needs at this time.

## 2024-04-28 NOTE — ED Provider Notes (Signed)
 [facility] Provider Note    Event Date/Time   First MD Initiated Contact with Patient 04/28/24 1500     (approximate)   History   Leg Pain   HPI  Roger Black is a 68 year old male presenting to the emergency department for evaluation of back pain.  Patient reports he has been taking his prescribed back pain, but at work today had worsening symptoms leading him to call his physical medicine doctor who recommended ER presentation.  Has not specifically noted fevers.  No bowel or bladder incontinence or retention.  Continues to have back pain over his low back with radiation initially down his right and now down both of his legs.  No recent back trauma.   I reviewed his physical medicine note from 04/23/2024.  At that time, patient was seen in evaluation for bilateral arm pain, also reported new back pain with radiation into the bilateral legs x 1 month.  At that time, an MRI of his lumbar spine was ordered and he was placed on gabapentin and Norco for pain control.  I reviewed the MRI and radiology report.  This notes multilevel stenosis with enhancement of several nerve roots of the cauda equina without nodular masslike enhancement which radiology notes may be secondary to underlying infectious or inflammatory process.     Physical Exam   Triage Vital Signs: ED Triage Vitals  Encounter Vitals Group     BP 04/28/24 1254 (!) 148/102     Girls Systolic BP Percentile --      Girls Diastolic BP Percentile --      Boys Systolic BP Percentile --      Boys Diastolic BP Percentile --      Pulse Rate 04/28/24 1254 (!) 115     Resp 04/28/24 1254 18     Temp 04/28/24 1254 97.7 F (36.5 C)     Temp src --      SpO2 04/28/24 1254 96 %     Weight 04/28/24 1255 205 lb (93 kg)     Height 04/28/24 1418 5' 9 (1.753 m)     Head Circumference --      Peak Flow --      Pain Score 04/28/24 1255 8     Pain Loc --      Pain Education --      Exclude from Growth  Chart --     Most recent vital signs: Vitals:   04/28/24 2225 04/28/24 2247  BP: 116/79   Pulse: (!) 116   Resp: 18   Temp:  98 F (36.7 C)  SpO2: 90%      General: Awake, interactive  CV:  Good peripheral perfusion Resp:  Unlabored respirations.  Abd:  Nondistended, soft Neuro:  Alert and oriented, symmetric strength in the bilateral lower extremities, mild symmetric weakness with hip flexion that seems likely pain limited, intact sensation throughout the extremity the patient does report a burning sensation with palpation of his right leg.   ED Results / Procedures / Treatments   Labs (all labs ordered are listed, but only abnormal results are displayed) Labs Reviewed  COMPREHENSIVE METABOLIC PANEL WITH GFR - Abnormal; Notable for the following components:      Result Value   Sodium 134 (*)    CO2 20 (*)    Glucose, Bld 120 (*)    BUN 24 (*)    Creatinine, Ser 1.36 (*)    GFR, Estimated 57 (*)    All  other components within normal limits  CBC - Abnormal; Notable for the following components:   RBC 3.99 (*)    All other components within normal limits  SEDIMENTATION RATE  BRAIN NATRIURETIC PEPTIDE  C-REACTIVE PROTEIN  LYME DISEASE SEROLOGY W/REFLEX  PROTIME-INR  APTT  HIV ANTIBODY (ROUTINE TESTING W REFLEX)  BASIC METABOLIC PANEL WITH GFR  CBC     EKG EKG independently reviewed and interpreted by myself demonstrates:    RADIOLOGY Imaging independently reviewed and interpreted by myself demonstrates:   Formal Radiology Read:  No results found.  PROCEDURES:  Critical Care performed: No  Procedures   MEDICATIONS ORDERED IN ED: Medications  morphine (PF) 2 MG/ML injection 2 mg (has no administration in time range)  oxyCODONE-acetaminophen (PERCOCET/ROXICET) 5-325 MG per tablet 1 tablet (1 tablet Oral Given 04/28/24 2045)  lidocaine (LIDODERM) 5 % 1 patch (1 patch Transdermal Patch Applied 04/28/24 2055)  methocarbamol (ROBAXIN) tablet 500 mg (has no  administration in time range)  dextromethorphan-guaiFENesin (MUCINEX DM) 30-600 MG per 12 hr tablet 1 tablet (has no administration in time range)  zolpidem (AMBIEN) tablet 5 mg (has no administration in time range)  ondansetron (ZOFRAN) injection 4 mg (has no administration in time range)  hydrALAZINE (APRESOLINE) injection 5 mg (has no administration in time range)  acetaminophen (TYLENOL) tablet 650 mg (has no administration in time range)  albuterol (PROVENTIL) (2.5 MG/3ML) 0.083% nebulizer solution 2.5 mg (has no administration in time range)  aspirin EC tablet 81 mg (has no administration in time range)  atorvastatin (LIPITOR) tablet 80 mg (80 mg Oral Given 04/28/24 2046)  doxazosin (CARDURA) tablet 2 mg (has no administration in time range)  irbesartan (AVAPRO) tablet 300 mg (has no administration in time range)  spironolactone (ALDACTONE) tablet 50 mg (has no administration in time range)  pantoprazole (PROTONIX) EC tablet 40 mg (has no administration in time range)  gabapentin (NEURONTIN) capsule 600 mg (600 mg Oral Given 04/28/24 2247)  montelukast (SINGULAIR) tablet 10 mg (10 mg Oral Given 04/28/24 2045)  fluticasone furoate-vilanterol (BREO ELLIPTA) 200-25 MCG/ACT 1 puff (has no administration in time range)  oxyCODONE-acetaminophen (PERCOCET/ROXICET) 5-325 MG per tablet 1 tablet (1 tablet Oral Given 04/28/24 1640)  morphine (PF) 4 MG/ML injection 4 mg (4 mg Intravenous Given 04/28/24 1936)     IMPRESSION / MDM / ASSESSMENT AND PLAN / ED COURSE  I reviewed the triage vital signs and the nursing notes.  Differential diagnosis includes, but is not limited to, herniation with associated sciatica, clinical history less suggestive of cauda equina, though I do note enhancement of the roots of the cauda equina noted by radiology on recent MRI  Patient's presentation is most consistent with acute presentation with potential threat to life or bodily function.  68 year old male presenting to  the emergency department for evaluation of 1 month of worsening back pain, recent MRI performed as an outpatient.  Tachycardic on initial presentation, improved at time of my initial evaluation.  He received Percocet prior to my initial evaluation and did seem to have some improved back pain with this.  However, I did review his recent MRI and I do see areas of canal stenosis with the radiology comment of enhancement of the cauda equina roots.  Clinical history is not suggestive of an acute cauda equina, but will review with neurosurgery for further recommendations.    1550 Notified Dr. Clois currently in OR, secure chat sent per request.   [phone] Received update from Dr. Clois that he reviewed imaging  and recommended daytime LP to send for cytology and discussion with neurology team as he felt this was more likely a primary neurological as opposed to neurosurgical issue.   1850 Case discussed with Dr. Michaela with neurology. He agrees with recommendation for LP during business hours to obtain accurate cytology results. Does recommend admission to hospitalist service for further management. Discussed with patient who is comfortable with this plan.   Case discussed with hospitalist team.  They will evaluate for anticipated admission.     FINAL CLINICAL IMPRESSION(S) / ED DIAGNOSES   Final diagnoses:  Midline low back pain with bilateral sciatica, unspecified chronicity     Rx / DC Orders   ED Discharge Orders     None        Note:  This document was prepared using Dragon voice recognition software and may include unintentional dictation errors.   Levander Slate, MD 04/28/24 [phone]

## 2024-04-29 ENCOUNTER — Observation Stay

## 2024-04-29 DIAGNOSIS — I700 Atherosclerosis of aorta: Secondary | ICD-10-CM | POA: Diagnosis not present

## 2024-04-29 DIAGNOSIS — R000 Tachycardia, unspecified: Secondary | ICD-10-CM | POA: Diagnosis not present

## 2024-04-29 DIAGNOSIS — Z8249 Family history of ischemic heart disease and other diseases of the circulatory system: Secondary | ICD-10-CM | POA: Diagnosis not present

## 2024-04-29 DIAGNOSIS — Z7189 Other specified counseling: Secondary | ICD-10-CM | POA: Diagnosis not present

## 2024-04-29 DIAGNOSIS — R9089 Other abnormal findings on diagnostic imaging of central nervous system: Secondary | ICD-10-CM | POA: Diagnosis not present

## 2024-04-29 DIAGNOSIS — R0902 Hypoxemia: Secondary | ICD-10-CM | POA: Diagnosis not present

## 2024-04-29 DIAGNOSIS — M50222 Other cervical disc displacement at C5-C6 level: Secondary | ICD-10-CM | POA: Diagnosis not present

## 2024-04-29 DIAGNOSIS — Z85118 Personal history of other malignant neoplasm of bronchus and lung: Secondary | ICD-10-CM | POA: Diagnosis not present

## 2024-04-29 DIAGNOSIS — R262 Difficulty in walking, not elsewhere classified: Secondary | ICD-10-CM | POA: Diagnosis not present

## 2024-04-29 DIAGNOSIS — C3490 Malignant neoplasm of unspecified part of unspecified bronchus or lung: Secondary | ICD-10-CM | POA: Diagnosis not present

## 2024-04-29 DIAGNOSIS — Z923 Personal history of irradiation: Secondary | ICD-10-CM | POA: Diagnosis not present

## 2024-04-29 DIAGNOSIS — M5441 Lumbago with sciatica, right side: Secondary | ICD-10-CM | POA: Diagnosis not present

## 2024-04-29 DIAGNOSIS — Z86008 Personal history of in-situ neoplasm of other site: Secondary | ICD-10-CM | POA: Diagnosis not present

## 2024-04-29 DIAGNOSIS — D496 Neoplasm of unspecified behavior of brain: Secondary | ICD-10-CM | POA: Diagnosis not present

## 2024-04-29 DIAGNOSIS — C7951 Secondary malignant neoplasm of bone: Secondary | ICD-10-CM | POA: Diagnosis not present

## 2024-04-29 DIAGNOSIS — Z515 Encounter for palliative care: Secondary | ICD-10-CM | POA: Diagnosis not present

## 2024-04-29 DIAGNOSIS — I2510 Atherosclerotic heart disease of native coronary artery without angina pectoris: Secondary | ICD-10-CM | POA: Diagnosis not present

## 2024-04-29 DIAGNOSIS — G939 Disorder of brain, unspecified: Secondary | ICD-10-CM | POA: Diagnosis not present

## 2024-04-29 DIAGNOSIS — I10 Essential (primary) hypertension: Secondary | ICD-10-CM | POA: Diagnosis not present

## 2024-04-29 DIAGNOSIS — R29898 Other symptoms and signs involving the musculoskeletal system: Secondary | ICD-10-CM | POA: Diagnosis not present

## 2024-04-29 DIAGNOSIS — M5030 Other cervical disc degeneration, unspecified cervical region: Secondary | ICD-10-CM | POA: Diagnosis not present

## 2024-04-29 DIAGNOSIS — J4489 Other specified chronic obstructive pulmonary disease: Secondary | ICD-10-CM | POA: Diagnosis not present

## 2024-04-29 DIAGNOSIS — M79604 Pain in right leg: Secondary | ICD-10-CM | POA: Diagnosis not present

## 2024-04-29 DIAGNOSIS — G959 Disease of spinal cord, unspecified: Secondary | ICD-10-CM

## 2024-04-29 DIAGNOSIS — C7931 Secondary malignant neoplasm of brain: Secondary | ICD-10-CM | POA: Diagnosis not present

## 2024-04-29 DIAGNOSIS — Z6831 Body mass index (BMI) 31.0-31.9, adult: Secondary | ICD-10-CM | POA: Diagnosis not present

## 2024-04-29 DIAGNOSIS — M549 Dorsalgia, unspecified: Secondary | ICD-10-CM | POA: Diagnosis present

## 2024-04-29 DIAGNOSIS — Z85828 Personal history of other malignant neoplasm of skin: Secondary | ICD-10-CM | POA: Diagnosis not present

## 2024-04-29 DIAGNOSIS — M79605 Pain in left leg: Secondary | ICD-10-CM | POA: Diagnosis not present

## 2024-04-29 DIAGNOSIS — I130 Hypertensive heart and chronic kidney disease with heart failure and stage 1 through stage 4 chronic kidney disease, or unspecified chronic kidney disease: Secondary | ICD-10-CM | POA: Diagnosis not present

## 2024-04-29 DIAGNOSIS — M79606 Pain in leg, unspecified: Secondary | ICD-10-CM | POA: Diagnosis not present

## 2024-04-29 DIAGNOSIS — G9600 Cerebrospinal fluid leak, unspecified: Secondary | ICD-10-CM | POA: Diagnosis not present

## 2024-04-29 DIAGNOSIS — E66811 Obesity, class 1: Secondary | ICD-10-CM | POA: Diagnosis not present

## 2024-04-29 DIAGNOSIS — Z7951 Long term (current) use of inhaled steroids: Secondary | ICD-10-CM | POA: Diagnosis not present

## 2024-04-29 DIAGNOSIS — R836 Abnormal cytological findings in cerebrospinal fluid: Secondary | ICD-10-CM | POA: Diagnosis not present

## 2024-04-29 DIAGNOSIS — M5442 Lumbago with sciatica, left side: Secondary | ICD-10-CM | POA: Diagnosis not present

## 2024-04-29 DIAGNOSIS — Z79899 Other long term (current) drug therapy: Secondary | ICD-10-CM | POA: Diagnosis not present

## 2024-04-29 DIAGNOSIS — K219 Gastro-esophageal reflux disease without esophagitis: Secondary | ICD-10-CM | POA: Diagnosis not present

## 2024-04-29 DIAGNOSIS — G9389 Other specified disorders of brain: Secondary | ICD-10-CM | POA: Diagnosis not present

## 2024-04-29 DIAGNOSIS — N1831 Chronic kidney disease, stage 3a: Secondary | ICD-10-CM | POA: Diagnosis not present

## 2024-04-29 DIAGNOSIS — M792 Neuralgia and neuritis, unspecified: Secondary | ICD-10-CM | POA: Diagnosis not present

## 2024-04-29 DIAGNOSIS — C7932 Secondary malignant neoplasm of cerebral meninges: Secondary | ICD-10-CM | POA: Diagnosis not present

## 2024-04-29 DIAGNOSIS — E785 Hyperlipidemia, unspecified: Secondary | ICD-10-CM | POA: Diagnosis not present

## 2024-04-29 DIAGNOSIS — R937 Abnormal findings on diagnostic imaging of other parts of musculoskeletal system: Secondary | ICD-10-CM | POA: Diagnosis not present

## 2024-04-29 DIAGNOSIS — G936 Cerebral edema: Secondary | ICD-10-CM | POA: Diagnosis not present

## 2024-04-29 DIAGNOSIS — I5022 Chronic systolic (congestive) heart failure: Secondary | ICD-10-CM | POA: Diagnosis not present

## 2024-04-29 DIAGNOSIS — Z7401 Bed confinement status: Secondary | ICD-10-CM | POA: Diagnosis not present

## 2024-04-29 LAB — CSF CELL COUNT WITH DIFFERENTIAL
Eosinophils, CSF: 0 %
Lymphs, CSF: 85 %
Monocyte-Macrophage-Spinal Fluid: 13 %
RBC Count, CSF: 0 /mm3 (ref 0–3)
Segmented Neutrophils-CSF: 2 %
Tube #: 3
WBC, CSF: 39 /mm3 (ref 0–5)

## 2024-04-29 LAB — CBC
HCT: 38.3 % — ABNORMAL LOW (ref 39.0–52.0)
Hemoglobin: 12.6 g/dL — ABNORMAL LOW (ref 13.0–17.0)
MCH: 33.1 pg (ref 26.0–34.0)
MCHC: 32.9 g/dL (ref 30.0–36.0)
MCV: 100.5 fL — ABNORMAL HIGH (ref 80.0–100.0)
Platelets: 152 K/uL (ref 150–400)
RBC: 3.81 MIL/uL — ABNORMAL LOW (ref 4.22–5.81)
RDW: 12.7 % (ref 11.5–15.5)
WBC: 5.7 K/uL (ref 4.0–10.5)
nRBC: 0 % (ref 0.0–0.2)

## 2024-04-29 LAB — MENINGITIS/ENCEPHALITIS PANEL (CSF)

## 2024-04-29 LAB — BASIC METABOLIC PANEL WITH GFR
Anion gap: 7 (ref 5–15)
BUN: 27 mg/dL — ABNORMAL HIGH (ref 8–23)
CO2: 26 mmol/L (ref 22–32)
Calcium: 9.4 mg/dL (ref 8.9–10.3)
Chloride: 102 mmol/L (ref 98–111)
Creatinine, Ser: 1.32 mg/dL — ABNORMAL HIGH (ref 0.61–1.24)
GFR, Estimated: 59 mL/min — ABNORMAL LOW (ref >60)
Glucose, Bld: 146 mg/dL — ABNORMAL HIGH (ref 70–99)
Potassium: 4.9 mmol/L (ref 3.5–5.1)
Sodium: 135 mmol/L (ref 135–145)

## 2024-04-29 LAB — HIV ANTIBODY (ROUTINE TESTING W REFLEX): HIV Screen 4th Generation wRfx: NONREACTIVE

## 2024-04-29 LAB — APTT: aPTT: 36 s (ref 24–36)

## 2024-04-29 LAB — PROTEIN AND GLUCOSE, CSF
Glucose, CSF: 37 mg/dL — ABNORMAL LOW (ref 40–70)
Total  Protein, CSF: 94 mg/dL — ABNORMAL HIGH (ref 15–45)

## 2024-04-29 LAB — PROTIME-INR
INR: 1.1 (ref 0.8–1.2)
Prothrombin Time: 14.4 s (ref 11.4–15.2)

## 2024-04-29 MED ORDER — VANCOMYCIN HCL 2000 MG/400ML IV SOLN
2000.0000 mg | Freq: Once | INTRAVENOUS | Status: AC
  Administered 2024-04-29: 2000 mg via INTRAVENOUS
  Filled 2024-04-29: qty 400

## 2024-04-29 MED ORDER — VANCOMYCIN HCL IN DEXTROSE 1-5 GM/200ML-% IV SOLN
1000.0000 mg | Freq: Once | INTRAVENOUS | Status: DC
Start: 2024-04-29 — End: 2024-04-29
  Filled 2024-04-29: qty 200

## 2024-04-29 MED ORDER — DEXTROSE 5 % IV SOLN
10.0000 mg/kg | Freq: Three times a day (TID) | INTRAVENOUS | Status: DC
Start: 2024-04-29 — End: 2024-04-29
  Filled 2024-04-29: qty 19

## 2024-04-29 MED ORDER — LACTATED RINGERS IV SOLN: INTRAVENOUS | Status: DC

## 2024-04-29 MED ORDER — SODIUM CHLORIDE 0.9 % IV SOLN
2.0000 g | INTRAVENOUS | Status: DC
  Administered 2024-04-29 – 2024-04-30 (×3): 2 g via INTRAVENOUS
  Filled 2024-04-29 (×7): qty 2000

## 2024-04-29 MED ORDER — LIDOCAINE 1 % OPTIME INJ - NO CHARGE
5.0000 mL | Freq: Once | INTRAMUSCULAR | Status: AC
  Administered 2024-04-29: 5 mL via INTRADERMAL
  Filled 2024-04-29: qty 6

## 2024-04-29 MED ORDER — DEXTROSE 5 % IV SOLN
10.0000 mg/kg | Freq: Three times a day (TID) | INTRAVENOUS | Status: DC
  Administered 2024-04-29 – 2024-04-30 (×2): 805 mg via INTRAVENOUS
  Filled 2024-04-29 (×3): qty 16.1

## 2024-04-29 MED ORDER — VANCOMYCIN HCL 1750 MG/350ML IV SOLN
1750.0000 mg | INTRAVENOUS | Status: DC
  Administered 2024-04-30: 1750 mg via INTRAVENOUS
  Filled 2024-04-29 (×2): qty 350

## 2024-04-29 MED ORDER — SODIUM CHLORIDE 0.9 % IV SOLN
2.0000 g | Freq: Two times a day (BID) | INTRAVENOUS | Status: DC
  Administered 2024-04-29 – 2024-05-01 (×4): 2 g via INTRAVENOUS
  Filled 2024-04-29 (×5): qty 20

## 2024-04-29 NOTE — Consult Note (Signed)
 NEUROLOGY CONSULT NOTE   Date of service: April 29, 2024 Patient Name: Roger Black MRN:  [id number] DOB:  Dec 31, 1955 Chief Complaint: Pain Requesting Provider: Jerelene Critchley, MD  History of Present Illness  Roger Black is a 68 y.o. male with hx of pain radiating into his legs for the past 5 to 6 weeks.  It has been progressively getting worse, and has been affecting his gait.  Has any numbness or weakness, states that it is only pain.  As part of the workup for this, he had a lumbar spine MRI with contrast which demonstrates enhancing nerve roots and for this reason he was referred to the emergency department for further evaluation.  Of note, he does have a history of non-small cell lung cancer of the left upper lobe which was treated with radiation.    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 His smoking use included cigarettes. He has a  47 pack-year smoking history. He has never used smokeless tobacco. He reports that he does not drink alcohol  and does not use drugs.  No Known Allergies  Medications   Current Facility-Administered Medications:    acetaminophen (TYLENOL) tablet 650 mg, 650 mg, Oral, Q6H PRN, Niu, Xilin, MD   albuterol (PROVENTIL) (2.5 MG/3ML) 0.083% nebulizer solution 2.5 mg, 2.5 mg, Nebulization, Q4H PRN, Niu, Xilin, MD   aspirin EC tablet 81 mg, 81 mg, Oral, Daily, Niu, Xilin, MD, 81 mg at 04/29/24 0949   atorvastatin (LIPITOR) tablet 80 mg, 80 mg, Oral, Daily, Niu, Xilin, MD, 80 mg at 04/29/24 9048   dextromethorphan-guaiFENesin (MUCINEX DM) 30-600 MG per 12 hr tablet 1 tablet, 1 tablet, Oral, BID PRN, Niu, Xilin, MD   doxazosin (CARDURA) tablet 2 mg, 2 mg, Oral, QHS, Niu, Xilin, MD   fluticasone furoate-vilanterol (BREO ELLIPTA) 200-25 MCG/ACT 1 puff, 1 puff, Inhalation, Daily, Niu, Xilin, MD, 1 puff at 04/29/24 1001   gabapentin (NEURONTIN) capsule 600 mg, 600 mg, Oral, TID, Niu, Xilin, MD, 600 mg at 04/29/24 0950   hydrALAZINE (APRESOLINE) injection 5 mg, 5 mg, Intravenous, Q2H PRN, Niu, Xilin, MD   irbesartan (AVAPRO) tablet 300 mg, 300 mg, Oral, Daily, Niu, Xilin, MD, 300 mg at 04/29/24 0950   lidocaine (LIDODERM) 5 % 1 patch, 1 patch, Transdermal, Q24H, Niu, Xilin, MD, 1 patch at 04/28/24 2055   methocarbamol (ROBAXIN) tablet 500 mg, 500  mg, Oral, Q8H PRN, Niu, Xilin, MD, 500 mg at 04/29/24 0520   montelukast (SINGULAIR) tablet 10 mg, 10 mg, Oral, Daily, Niu, Xilin, MD, 10 mg at 04/29/24 0949   morphine (PF) 2 MG/ML injection 2 mg, 2 mg, Intravenous, Q4H PRN, Niu, Xilin, MD, 2 mg at 04/29/24 1002   ondansetron (ZOFRAN) injection 4 mg, 4 mg, Intravenous, Q8H PRN, Niu, Xilin, MD   oxyCODONE-acetaminophen (PERCOCET/ROXICET) 5-325 MG per tablet 1 tablet, 1 tablet, Oral, Q4H PRN, Niu, Xilin, MD, 1 tablet at 04/29/24 0951   pantoprazole (PROTONIX) EC tablet 40 mg, 40 mg, Oral, Daily, Niu, Xilin, MD, 40 mg  at 04/29/24 9050   spironolactone (ALDACTONE) tablet 50 mg, 50 mg, Oral, Daily, Niu, Xilin, MD, 50 mg at 04/29/24 0950   zolpidem (AMBIEN) tablet 5 mg, 5 mg, Oral, QHS PRN, Niu, Xilin, MD  Vitals   Vitals:   04/29/24 2247 2024-04-29 2330 04/29/24 0239 04/29/24 0737  BP:  (!) 126/98 110/73 124/78  Pulse:  (!) 112 97 98  Resp:  18 18 17   Temp: 98 F (36.7 C) 98.5 F (36.9 C) 98.5 F (36.9 C) 98.3 F (36.8 C)  TempSrc: Oral  Oral   SpO2:  94% 98% 99%  Weight:   95 kg   Height:        Body mass index is 30.93 kg/m.   Physical Exam   Constitutional: Appears well-developed and well-nourished.  Neurologic Examination    Neuro: Mental Status: Patient is awake, alert, oriented to person, place, month, year, and situation. Patient is able to give a clear and coherent history. No signs of aphasia or neglect Cranial Nerves: II: Visual Fields are full. Pupils are equal, round, and reactive to light.   III,IV, VI: EOMI without ptosis or diploplia.  V: Facial sensation is symmetric to temperature VII: Facial movement is symmetric.  VIII: hearing is intact to voice X: Uvula elevates symmetrically XII: tongue is midline without atrophy or fasciculations.  Motor: Tone is normal. Bulk is normal. 5/5 strength was present in all four extremities.  Sensory: Sensation is symmetric to light touch and temperature in the arms and legs. Deep Tendon Reflexes: 2+ and symmetric in the biceps, he has a 2+ reflex in the left patella but no reflex in the right patella, he has good ankle reflexes bilaterally Cerebellar: Finger-nose-finger intact bilaterally       Labs/Imaging/Neurodiagnostic studies   CBC:  Recent Labs  Lab 04-29-24 1303 04/29/24 0056  WBC 6.9 5.7  HGB 13.3 12.6*  HCT 39.9 38.3*  MCV 100.0 100.5*  PLT 160 152   Basic Metabolic Panel:  Lab Results  Component Value Date   NA 135 04/29/2024   K 4.9 04/29/2024   CO2 26 04/29/2024   GLUCOSE 146 (H) 04/29/2024    BUN 27 (H) 04/29/2024   CREATININE 1.32 (H) 04/29/2024   CALCIUM 9.4 04/29/2024   GFRNONAA 59 (L) 04/29/2024   Lipid Panel: No results found for: LDLCALC HgbA1c: No results found for: HGBA1C Urine Drug Screen: No results found for: LABOPIA, COCAINSCRNUR, LABBENZ, AMPHETMU, THCU, LABBARB  Alcohol  Level No results found for: [facility] INR  Lab Results  Component Value Date   INR 1.1 04/29/2024   APTT  Lab Results  Component Value Date   APTT 36 04/29/2024   MRI lumbar spine (personally reviewed): He has enhancing nerve roots  ASSESSMENT   Melbourne Jakubiak is a 68 y.o. male with a history of non-small cell lung cancer who presents with enhancing  nerve roots of unclear etiology.  Given the duration of his symptoms, I think that infectious etiology is very unlikely.  Neoplastic process or paraneoplastic process would also be possibilities.  I think lumbar puncture is indicated to both assess for inflammation as well as for cytology.  HIV can sometimes do this, but his test is negative.  ESR and CRP are negative making a systemic vasculitis unlikely.  Excellent Lyme disease serology as well.  If his cytology is negative, then I think a trial of steroids may be beneficial as this could be inflammatory.  RECOMMENDATIONS  Lumbar puncture for cells, protein, glucose, cultures, if there is a pleocytosis I would send a meningitis/encephalitis panel although I think that infection is very unlikely. Following LP results, may consider steroids ______________________________________________________________________  Bonney Aisha Seals, MD [facility]

## 2024-04-29 NOTE — Progress Notes (Signed)
 Pharmacy Antibiotic Note  Aryeh Butterfield is a 68 y.o. male w/ PMH of asthma, CHF, CAD, COPD, skin cancer, CKD, HTN admitted on 04/28/2024 with meningitis.  Pharmacy has been consulted for vancomycin and acyclovir dosing.Based on recent BMP his serum creatinine is at approximate baseline  Plan:  1) start vancomycin 2000 mg IV x 1 then 1750 mg IV every 24 hours --Ke: 0.049h-1, T1/2 14.2h --SCr used: 1.32 mg/dL --Cp/Ct (expected): 62.8/87.3 mg/dL  2) start acyclovir 10 mg/kg (using adjusted body weight) IV every 8 hours  Height: 5' 9 (175.3 cm) Weight: 95 kg (209 lb 7 oz) IBW/kg (Calculated) : 70.7  Temp (24hrs), Avg:98.3 F (36.8 C), Min:98 F (36.7 C), Max:98.5 F (36.9 C)  Recent Labs  Lab 04/28/24 1303 04/29/24 0056  WBC 6.9 5.7  CREATININE 1.36* 1.32*    Estimated Creatinine Clearance: 60.9 mL/min (A) (by C-G formula based on SCr of 1.32 mg/dL (H)).    No Known Allergies  Antimicrobials this admission: 10/07 ampicillin >> 10/07 vancomycin >>  10/07 ceftriaxone >> 10/07 acyclovir >>   Microbiology results: 10/07 CSF Cx: pending  Thank you for allowing pharmacy to be a part of this patient's care.  Adriana JONETTA Bolster 04/29/2024 3:50 PM

## 2024-04-29 NOTE — Procedures (Signed)
 PROCEDURE SUMMARY:  Successful fluoroscopic guided lumbar puncture at the level of L5-S1.  Opening pressure was 22 cm H2O ~23 mL clear colorless fluid collected and sent for labs.  Closing pressure was 9 cm H2O.  No immediate complications.  Pt tolerated well.   EBL = none  Please see full dictation in imaging section of Epic for procedure details.   Electronically Signed: Cashmere Dingley M Ahriyah Vannest, PA-C 04/29/2024, 2:18 PM

## 2024-04-29 NOTE — Plan of Care (Signed)

## 2024-04-29 NOTE — Care Management Obs Status (Signed)
 MEDICARE OBSERVATION STATUS NOTIFICATION   Patient Details  Name: Roger Black MRN: [id number] Date of Birth: Nov 05, 1955   Medicare Observation Status Notification Given:  Yes    [name] 04/29/2024, 12:07 PM

## 2024-04-29 NOTE — TOC CM/SW Note (Signed)
 Transition of Care [facility]) CM/SW Note    Transition of Care [facility]) - Inpatient Brief Assessment   Patient Details  Name: Roger Black MRN: [id number] Date of Birth: Jan 25, 1956  Transition of Care [facility]) CM/SW Contact:    Alfonso Rummer, LCSW Phone Number: 04/29/2024, 12:29 PM   Clinical Narrative: LCSW A. Llesenia Fogal completed TOC Chart review No TOC needs identified please contact should needs arise.    Transition of Care Asessment: Insurance and Status: Insurance coverage has been reviewed Patient has primary care physician: No Home environment has been reviewed: single family home   Prior/Current Home Services: No current home services Social Drivers of Health Review: SDOH reviewed no interventions necessary Readmission risk has been reviewed: Yes Transition of care needs: no transition of care needs at this time

## 2024-04-29 NOTE — Consult Note (Addendum)
 Consult requested by:  Dr. Levander  Consult requested for:  Abnormal mri  Primary Physician:  Roger [name], MD  History of Present Illness: 04/29/2024 [name] is here today with a chief complaint of slowly worsening discomfort in his legs.  This pain can be severe, and sometimes moves in different locations in his leg.  He gets occasional tingling.  He feels weak, but has not had any falls.  This is worse in his right leg than his left leg, but impacts both legs.  He also had an episode where he had tingling in his right arm in the past.  He has had imaging evaluation of this which showed arthritic changes.  He denies bowel or bladder symptoms.  He denies fever, chills, nausea, or vomiting.  He does sometimes feel sick to his stomach when his pain is very severe.  He was admitted due to severe pain.  The symptoms are causing a significant impact on the patient's life.   I have utilized the care everywhere function in epic to review the outside records available from external health systems.  Review of Systems:  A 10 point review of systems is negative, except for the pertinent positives and negatives detailed in the HPI.  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   aspirin 81 MG EC tablet Take 81 mg by mouth daily.   atorvastatin (LIPITOR) 80 MG tablet Take 80 mg by mouth daily.   budesonide-formoterol (SYMBICORT) 160-4.5 MCG/ACT inhaler Inhale 2 puffs into the lungs 2 (two) times daily.   doxazosin (CARDURA) 2 MG tablet Take 2 mg by mouth at bedtime.   fluorouracil  (EFUDEX ) 5 % cream Apply topically 2 (two) times daily. Apply twice daily to aa scalp and back of hands until red and irritated, about day 7   gabapentin (NEURONTIN) 300 MG capsule Take 2 capsules by mouth 3 (three) times daily.   HYDROcodone-acetaminophen (NORCO/VICODIN) 5-325 MG tablet Take 1 tablet by mouth 2 (two) times daily as needed for moderate pain (pain score 4-6).   ipratropium (ATROVENT) 0.03 % nasal spray Place into the nose.   montelukast (SINGULAIR) 10 MG tablet Take 10 mg by mouth daily.   olmesartan (BENICAR) 40 MG tablet Take 40 mg by mouth daily.   pantoprazole (PROTONIX) 40 MG tablet Take 40 mg by mouth  daily.   potassium chloride (KLOR-CON) 10 MEQ tablet Take 10 mEq by mouth daily.   spironolactone (ALDACTONE) 50 MG tablet Take 50 mg by mouth daily.   torsemide (DEMADEX) 100 MG tablet Take 100 mg by mouth daily as needed (edema).   [DISCONTINUED] levocetirizine (XYZAL) 5 MG tablet Take 5 mg by mouth at bedtime.    Social History: Social History   Tobacco Use   Smoking status: Former    Current packs/day: 1.00    Average packs/day: 1 pack/day for 47.0 years (47.0 ttl pk-yrs)    Types: Cigarettes   Smokeless tobacco: Never  Vaping Use   Vaping status: Never Used  Substance Use Topics    Alcohol  use: Never   Drug use: Never    Family Medical History: Family History  Problem Relation Age of Onset   Hypertension Mother    Hypertension Father     Physical Examination: Vitals:   04/28/24 2330 04/29/24 0239  BP: (!) 126/98 110/73  Pulse: (!) 112 97  Resp: 18 18  Temp: 98.5 F (36.9 C) 98.5 F (36.9 C)  SpO2: 94% 98%    General: Patient is in no apparent distress. Attention to examination is appropriate.  Neck:   Supple.  Full range of motion.  Respiratory: Patient is breathing without any difficulty.   NEUROLOGICAL:     Awake, alert, oriented to person, place, and time.  Speech is clear and fluent.  Cranial Nerves: Pupils equal round and reactive to light.  Facial tone is symmetric.  Facial sensation is symmetric.   Strength: Side Biceps Triceps Deltoid Interossei Grip Wrist Ext. Wrist Flex.  R 5 5 5 5 5 5 5   L 5 5 5 5 5 5 5    Side Iliopsoas Quads Hamstring PF DF EHL  R 5 5 5 5 5 5   L 5 5 5 5 5 5    Bilateral upper and lower extremity sensation is intact to light touch.    No evidence of dysmetria noted.  Gait is untested.     Medical Decision Making  Imaging: MRI L spine w and without contrast 04/25/2024 ADDENDUM: The study was performed without and with intravenous statin contrast. There is enhancement of several nerve roots of the cauda equina. There is no nodular or mass-like enhancement. This may be secondary to an underlying infectious or inflammatory process   ----------------------------------------------------   Electronically signed by: Roger Coho MD 04/26/2024 04:07 AM EDT RP Workstation: [id number]  I have personally reviewed the images and agree with the above interpretation.  Assessment and Plan: [name] is a pleasant 68 y.o. male with enhancing lesions in his lumbar spine.  This may be due to infectious, inflammatory, or neoplastic processes.  I have recommended neurology evaluation in addition to obtaining spinal  fluid (high volume tap) for both cytology and for additional studies.  My colleagues in neurology may have additional recommendations for laboratory testing of his spinal fluid.  I will help arrange interventional radiology consultation.    I have communicated my recommendations to the requesting physician and coordinated care to facilitate these recommendations.     Verania Salberg K. Clois MD, [facility] Neurosurgery

## 2024-04-29 NOTE — Progress Notes (Addendum)
 PROGRESS NOTE    Roger Black  FMW:[id number] DOB: 17-Mar-1956 DOA: 04/28/2024 PCP: [name], MD  Chief Complaint  Patient presents with   Leg Pain    Hospital Course:  [name] is a 68 y.o. male with medical history significant of dCHF with EF 45%, HTN, HLD, CAD, former smoker, prior lung cancer, COPD/asthma, GERD, CKD-3, skin cancer, obesity, who presents with leg pain.   Pt states that he started having right leg pain about a month ago, which is initially located in the right upper and left lower leg, constant, severe, pulsatile pain, gradually down to the lower leg, now has whole right leg pain.  In the past several days, he developed pain in left upper leg with similar nature.  He reports occasional tingling in both legs.  No loss control of bladder or bowel movement.  Denies back pain,fever, chills.   Pt was seen by his PCP who ordered MRI-L [place] on 10/3, which showed multilevel stenosis with enhancement of several nerve roots of the cauda equina without nodular masslike enhancement which radiology notes may be secondary to underlying infectious or inflammatory process. Pt wasplaced on gabapentin and Norco with minimal relief.  Now patient cannot walk due to severe pain and mild leg weakness  Subjective: Patient was examined at the bedside, new to me today.  Spouse present at the bedside Continues to have right lower extremity pain, underwent LP Shows elevated WBC with lymphocyte predominance, started antibiotics and acyclovir though suspicion for meningitis is low   Objective: Vitals:   04/28/24 2247 04/28/24 2330 04/29/24 0239 04/29/24 0737  BP:  (!) 126/98 110/73 124/78  Pulse:  (!) 112 97 98  Resp:  18 18 17   Temp: 98 F (36.7 C) 98.5 F (36.9 C) 98.5 F (36.9 C) 98.3 F (36.8 C)  TempSrc: Oral  Oral   SpO2:  94% 98% 99%  Weight:   95 kg   Height:       No intake or output data in the 24 hours ending 04/29/24 0820 Filed Weights    04/28/24 1255 04/28/24 1418 04/29/24 0239  Weight: 93 kg 95.9 kg 95 kg    Examination: General: Not in acute distress Cardiac: S1/S2, RRR, No murmurs, No gallops or rubs. Respiratory: No rales, wheezing, rhonchi or rubs. GI: Soft, nondistended, nontender, no rebound pain, no organomegaly, BS present. GU: No hematuria Ext: No pitting leg edema bilaterally. 1+DP/PT pulse bilaterally. Musculoskeletal: No joint deformities, No joint redness or warmth, no limitation of ROM in spin. Has tenderness in the right upper and lateral leg Skin: No rashes.  Neuro: Alert, oriented X3, cranial nerves II-XII grossly intact, moves all extremities normally  Assessment & Plan:  Leg pain with enhancing lesions in lumbar spine - Suspect infectious vs inflammatory vs neoplastic - MRI-L [place] showed multilevel stenosis with enhancement of several nerve roots of the cauda equina without nodular masslike enhancement which radiology notes may be secondary to underlying infectious or inflammatory process - Seen by Neurology and Neurosurgery, appreciate recs - s/p high volume LP (20-52ml) by IR, pending analysis, gram stain and culture, cytology - Start broad spectrum abx and Acyclovir due to elevated WBC in CSF studies with lymphocyte predominance, IV fluids to prevent crystal induced AKI - As needed morphine, Percocet, Robaxin, Tylenol - Lidoderm patch - Fall precaution   COPD (chronic obstructive pulmonary disease) (HCC): Stable - Bronchodilators and as needed Mucinex   HTN (hypertension) - IV hydralazine as needed - Irbesartan,  spironolactone   Coronary artery disease - Aspirin, Lipitor   Hyperlipidemia - Lipitor   Chronic systolic CHF (congestive heart failure) (HCC) - 2D echo on 03/04/2024 showed EF of 45%.  Patient does not have leg edema.  No JVD. BNP 7.4  CHF seems to be compensated. - Continue spironolactone   Chronic kidney disease, stage 3a (HCC) - Follow-up with BMP   Obesity (BMI  30-39.9): Patient has Obesity Class I, with body weight 95.9 Kg and BMI 31.22 kg/m2.  - Encourage losing weight - Exercise and healthy diet  DVT prophylaxis: SCD   Code Status: Full Code Disposition:  TBD  Consultants:  Neurosurgery Neurology  Procedures:  Lumbar puncture 10/07  Antimicrobials:  Anti-infectives (From admission, onward)    None       Data Reviewed: I have personally reviewed following labs and imaging studies CBC: Recent Labs  Lab 04/28/24 1303 04/29/24 0056  WBC 6.9 5.7  HGB 13.3 12.6*  HCT 39.9 38.3*  MCV 100.0 100.5*  PLT 160 152   Basic Metabolic Panel: Recent Labs  Lab 04/28/24 1303 04/29/24 0056  NA 134* 135  K 4.8 4.9  CL 102 102  CO2 20* 26  GLUCOSE 120* 146*  BUN 24* 27*  CREATININE 1.36* 1.32*  CALCIUM 9.5 9.4   GFR: Estimated Creatinine Clearance: 60.9 mL/min (A) (by C-G formula based on SCr of 1.32 mg/dL (H)). Liver Function Tests: Recent Labs  Lab 04/28/24 1303  AST 26  ALT 21  ALKPHOS 66  BILITOT 1.0  PROT 7.6  ALBUMIN 4.3   CBG: No results for input(s): GLUCAP in the last 168 hours.  No results found for this or any previous visit (from the past 240 hours).   Radiology Studies: No results found.  Scheduled Meds:  aspirin EC  81 mg Oral Daily   atorvastatin  80 mg Oral Daily   doxazosin  2 mg Oral QHS   fluticasone furoate-vilanterol  1 puff Inhalation Daily   gabapentin  600 mg Oral TID   irbesartan  300 mg Oral Daily   lidocaine  1 patch Transdermal Q24H   montelukast  10 mg Oral Daily   pantoprazole  40 mg Oral Daily   spironolactone  50 mg Oral Daily   Continuous Infusions:   LOS: 0 days  MDM: Patient is high risk for one or more organ failure.  They necessitate ongoing hospitalization for continued IV therapies and subsequent lab monitoring. Total time spent interpreting labs and vitals, reviewing the medical record, coordinating care amongst consultants and care team members, directly assessing  and discussing care with the patient and/or family: 55 min Laree Lock, MD [facility]  To contact the attending physician between 7A-7P please use Epic Chat. To contact the covering physician during after hours 7P-7A, please review Amion.  04/29/2024, 8:20 AM   *This document has been created with the assistance of dictation software. Please excuse typographical errors. *

## 2024-04-30 DIAGNOSIS — I2510 Atherosclerotic heart disease of native coronary artery without angina pectoris: Secondary | ICD-10-CM

## 2024-04-30 DIAGNOSIS — M5441 Lumbago with sciatica, right side: Secondary | ICD-10-CM | POA: Diagnosis not present

## 2024-04-30 DIAGNOSIS — G959 Disease of spinal cord, unspecified: Secondary | ICD-10-CM | POA: Diagnosis not present

## 2024-04-30 DIAGNOSIS — M5442 Lumbago with sciatica, left side: Secondary | ICD-10-CM | POA: Diagnosis not present

## 2024-04-30 DIAGNOSIS — R937 Abnormal findings on diagnostic imaging of other parts of musculoskeletal system: Secondary | ICD-10-CM

## 2024-04-30 DIAGNOSIS — M792 Neuralgia and neuritis, unspecified: Secondary | ICD-10-CM | POA: Diagnosis not present

## 2024-04-30 DIAGNOSIS — R9089 Other abnormal findings on diagnostic imaging of central nervous system: Secondary | ICD-10-CM | POA: Diagnosis not present

## 2024-04-30 LAB — CYTOLOGY - NON PAP

## 2024-04-30 LAB — BASIC METABOLIC PANEL WITH GFR
Anion gap: 9 (ref 5–15)
BUN: 25 mg/dL — ABNORMAL HIGH (ref 8–23)
CO2: 23 mmol/L (ref 22–32)
Calcium: 9.1 mg/dL (ref 8.9–10.3)
Chloride: 103 mmol/L (ref 98–111)
Creatinine, Ser: 1.26 mg/dL — ABNORMAL HIGH (ref 0.61–1.24)
GFR, Estimated: >60 mL/min (ref >60)
Glucose, Bld: 112 mg/dL — ABNORMAL HIGH (ref 70–99)
Potassium: 5 mmol/L (ref 3.5–5.1)
Sodium: 135 mmol/L (ref 135–145)

## 2024-04-30 LAB — CBC
HCT: 37.7 % — ABNORMAL LOW (ref 39.0–52.0)
Hemoglobin: 12.5 g/dL — ABNORMAL LOW (ref 13.0–17.0)
MCH: 32.9 pg (ref 26.0–34.0)
MCHC: 33.2 g/dL (ref 30.0–36.0)
MCV: 99.2 fL (ref 80.0–100.0)
Platelets: 154 K/uL (ref 150–400)
RBC: 3.8 MIL/uL — ABNORMAL LOW (ref 4.22–5.81)
RDW: 12.7 % (ref 11.5–15.5)
WBC: 6.6 K/uL (ref 4.0–10.5)
nRBC: 0 % (ref 0.0–0.2)

## 2024-04-30 LAB — LYME DISEASE SEROLOGY W/REFLEX: Lyme Total Antibody EIA: NEGATIVE

## 2024-04-30 MED ORDER — DULOXETINE HCL 30 MG PO CPEP
60.0000 mg | ORAL_CAPSULE | Freq: Every day | ORAL | Status: DC
  Administered 2024-04-30 – 2024-05-06 (×7): 60 mg via ORAL
  Filled 2024-04-30 (×7): qty 2

## 2024-04-30 MED ORDER — GABAPENTIN 300 MG PO CAPS
900.0000 mg | ORAL_CAPSULE | Freq: Three times a day (TID) | ORAL | Status: DC
Start: 2024-04-30 — End: 2024-05-01
  Administered 2024-04-30 – 2024-05-01 (×3): 900 mg via ORAL
  Filled 2024-04-30 (×3): qty 3

## 2024-04-30 MED ORDER — HYALURONIDASE HUMAN 150 UNIT/ML IJ SOLN
150.0000 [IU] | Freq: Once | INTRAMUSCULAR | Status: AC
  Administered 2024-04-30: 150 [IU] via SUBCUTANEOUS
  Filled 2024-04-30: qty 1

## 2024-04-30 NOTE — Evaluation (Signed)
 Physical Therapy Evaluation Patient Details Name: Tashon Capp MRN: [id number] DOB: 01-20-1956 Today's Date: 04/30/2024  History of Present Illness  Angus Amini is a 68 y.o. male with medical history significant of dCHF with EF 45%, HTN, HLD, CAD, former smoker, COPD/asthma, GERD, CKD-3, skin cancer, obesity, who presents with leg pain.  Clinical Impression  Pt is 68 y.o. male patient admitted for leg pain. Prior to hospitalization, pt was independent with ADLs and ambulation. Pt is still working at a [employer], broadcasting/film/video. Pt demonstrated bed mobility with independence, required Min A for lift from bed for STS, and ambulation with independence. Pt able to amb 33ft without AD. Pt is limited by pain and suspect that when pain is managed, pt will no longed need physical assistance.  Pt demonstrates all bed mobility/transfers/ambulation at baseline level. Pt does not require any further PT needs at this time. Pt will be dc in house and does not require follow up. RN aware. Will dc current orders.       If plan is discharge home, recommend the following: A little help with walking and/or transfers   Can travel by private vehicle        Equipment Recommendations None recommended by PT  Recommendations for Other Services       Functional Status Assessment Patient has not had a recent decline in their functional status     Precautions / Restrictions Precautions Precautions: Fall Recall of Precautions/Restrictions: Intact Restrictions Weight Bearing Restrictions Per Provider Order: No      Mobility  Bed Mobility Overal bed mobility: Independent             General bed mobility comments: Able tomove from supine>sit without physical assistance. C/o pain with movement    Transfers Overall transfer level: Needs assistance Equipment used: Rolling walker (2 wheels) Transfers: Sit to/from Stand, Bed to chair/wheelchair/BSC Sit to Stand: Min assist   Step pivot  transfers: Supervision       General transfer comment: Min A for lift from lowest bed height    Ambulation/Gait Ambulation/Gait assistance: Supervision Gait Distance (Feet): 20 Feet Assistive device: Rolling walker (2 wheels), None Gait Pattern/deviations: WFL(Within Functional Limits)       General Gait Details: Pt initially amb using RW, however with further steps, pt able to amb without RW. No LOB, however does c/o pain  Stairs            Wheelchair Mobility     Tilt Bed    Modified Rankin (Stroke Patients Only)       Balance Overall balance assessment: Mild deficits observed, not formally tested                                           Pertinent Vitals/Pain Pain Assessment Pain Assessment: 0-10 Pain Score: 10-Worst pain ever (pt reports 12/10) Pain Location: legs Pain Descriptors / Indicators: Discomfort, Constant Pain Intervention(s): Monitored during session, Patient requesting pain meds-RN notified    Home Living Family/patient expects to be discharged to:: Private residence Living Arrangements: Spouse/significant other Available Help at Discharge: Family;Available 24 hours/day (wife is home on disability) Type of Home: House Home Access: Level entry       Home Layout: One level Home Equipment: Cane - single point      Prior Function Prior Level of Function : Independent/Modified Independent;Working/employed  Mobility Comments: indep with no AD ADLs Comments: MOD I-I with ADL/IADL, works at [employer]     Extremity/Trunk Assessment   Upper Extremity Assessment Upper Extremity Assessment: Overall WFL for tasks assessed    Lower Extremity Assessment Lower Extremity Assessment: Overall WFL for tasks assessed    Cervical / Trunk Assessment Cervical / Trunk Assessment: Normal  Communication   Communication Communication: No apparent difficulties    Cognition Arousal: Alert Behavior  During Therapy: WFL for tasks assessed/performed   PT - Cognitive impairments: No apparent impairments                       PT - Cognition Comments: Pt is A&Ox4. Following commands: Intact       Cueing Cueing Techniques: Verbal cues     General Comments      Exercises     Assessment/Plan    PT Assessment Patient does not need any further PT services  PT Problem List         PT Treatment Interventions      PT Goals (Current goals can be found in the Care Plan section)  Acute Rehab PT Goals Patient Stated Goal: to figure out what is going on PT Goal Formulation: With patient Time For Goal Achievement: 05/14/24 Potential to Achieve Goals: Good    Frequency       Co-evaluation               AM-PAC PT 6 Clicks Mobility  Outcome Measure Help needed turning from your back to your side while in a flat bed without using bedrails?: None Help needed moving from lying on your back to sitting on the side of a flat bed without using bedrails?: None Help needed moving to and from a bed to a chair (including a wheelchair)?: None Help needed standing up from a chair using your arms (e.g., wheelchair or bedside chair)?: A Little Help needed to walk in hospital room?: A Little Help needed climbing 3-5 steps with a railing? : A Little 6 Click Score: 21    End of Session Equipment Utilized During Treatment: Gait belt Activity Tolerance: Patient tolerated treatment well Patient left: in bed;with call bell/phone within reach;with nursing/sitter in room Nurse Communication: Mobility status PT Visit Diagnosis: Pain Pain - part of body: Hip    Time: 8896-8868 PT Time Calculation (min) (ACUTE ONLY): 28 min   Charges:                 Jevin Camino, SPT   Sheela Mcculley 04/30/2024, 12:58 PM

## 2024-04-30 NOTE — Progress Notes (Addendum)
 NEUROLOGY CONSULT FOLLOW UP NOTE   Date of service: April 30, 2024 Patient Name: Roger Black MRN:  [id number] DOB:  01-Jul-1956  Interval Hx/subjective   Still having significant pain   Vitals   Vitals:   04/29/24 0737 04/29/24 2050 04/30/24 0400 04/30/24 0828  BP: 124/78 124/81 (!) 113/55 103/62  Pulse: 98   (!) 115  Resp: 17 20 18 18   Temp: 98.3 F (36.8 C) 97.9 F (36.6 C) 98.7 F (37.1 C)   TempSrc:  Oral    SpO2: 99% 95% 91% 91%  Weight:   95.3 kg   Height:         Body mass index is 31.03 kg/m.  Physical Exam   Constitutional: Appears well-developed and well-nourished.  Neurologic Examination    MS: alert awake and interactive CN: EOMI, VFF, face symmetric Motor: Moves all extremities well but his lower extremity strength exam is more limited today due to pain Sensory: Intact to light touch and temperature  Medications  Current Facility-Administered Medications:    acetaminophen (TYLENOL) tablet 650 mg, 650 mg, Oral, Q6H PRN, Niu, Xilin, MD   albuterol (PROVENTIL) (2.5 MG/3ML) 0.083% nebulizer solution 2.5 mg, 2.5 mg, Nebulization, Q4H PRN, Niu, Xilin, MD   ampicillin (OMNIPEN) 2 g in sodium chloride  0.9 % 100 mL IVPB, 2 g, Intravenous, Q4H, Ponnala, Shruthi, MD, Last Rate: 300 mL/hr at 04/30/24 0500, 2 g at 04/30/24 0500   aspirin EC tablet 81 mg, 81 mg, Oral, Daily, Niu, Xilin, MD, 81 mg at 04/29/24 0949   atorvastatin (LIPITOR) tablet 80 mg, 80 mg, Oral, Daily, Niu, Xilin, MD, 80 mg at 04/29/24 0951   cefTRIAXone (ROCEPHIN) 2 g in sodium chloride  0.9 % 100 mL IVPB, 2 g, Intravenous, Q12H, Ponnala, Shruthi, MD, Last Rate: 200 mL/hr at 04/30/24 0620, 2 g at 04/30/24 0620   dextromethorphan-guaiFENesin (MUCINEX DM) 30-600 MG per 12 hr tablet 1 tablet, 1 tablet, Oral, BID PRN, Niu, Xilin, MD   doxazosin (CARDURA) tablet 2 mg, 2 mg, Oral, QHS, Niu, Xilin, MD, 2 mg at 04/29/24 2117   fluticasone furoate-vilanterol (BREO ELLIPTA) 200-25 MCG/ACT 1 puff,  1 puff, Inhalation, Daily, Niu, Xilin, MD, 1 puff at 04/29/24 1001   gabapentin (NEURONTIN) capsule 600 mg, 600 mg, Oral, TID, Niu, Xilin, MD, 600 mg at 04/29/24 2117   hydrALAZINE (APRESOLINE) injection 5 mg, 5 mg, Intravenous, Q2H PRN, Niu, Xilin, MD   irbesartan (AVAPRO) tablet 300 mg, 300 mg, Oral, Daily, Niu, Xilin, MD, 300 mg at 04/29/24 0950   lactated ringers infusion, , Intravenous, Continuous, Ponnala, Shruthi, MD, Paused at 04/30/24 0243   lidocaine (LIDODERM) 5 % 1 patch, 1 patch, Transdermal, Q24H, Niu, Xilin, MD, 1 patch at 04/29/24 2045   methocarbamol (ROBAXIN) tablet 500 mg, 500 mg, Oral, Q8H PRN, Niu, Xilin, MD, 500 mg at 04/30/24 0243   montelukast (SINGULAIR) tablet 10 mg, 10 mg, Oral, Daily, Niu, Xilin, MD, 10 mg at 04/29/24 0949   morphine (PF) 2 MG/ML injection 2 mg, 2 mg, Intravenous, Q4H PRN, Niu, Xilin, MD, 2 mg at 04/30/24 1028   ondansetron (ZOFRAN) injection 4 mg, 4 mg, Intravenous, Q8H PRN, Niu, Xilin, MD   oxyCODONE-acetaminophen (PERCOCET/ROXICET) 5-325 MG per tablet 1 tablet, 1 tablet, Oral, Q4H PRN, Niu, Xilin, MD, 1 tablet at 04/30/24 0242   pantoprazole (PROTONIX) EC tablet 40 mg, 40 mg, Oral, Daily, Niu, Xilin, MD, 40 mg at 04/29/24 9050   spironolactone (ALDACTONE) tablet 50 mg, 50 mg, Oral, Daily, Niu, Xilin, MD, 50  mg at 04/29/24 0950   vancomycin (VANCOREADY) IVPB 1750 mg/350 mL, 1,750 mg, Intravenous, Q24H, Nada Adriana BIRCH, RPH   zolpidem (AMBIEN) tablet 5 mg, 5 mg, Oral, QHS PRN, Niu, Xilin, MD  Labs and Diagnostic Imaging   CSF WBC 39 CSF RBC 0 CSF protein 94 CSF glucose 37  Fungal stain-pending Culture and fungus culture-pending Cytology pending  HIV nonreactive ESR and CRP are both normal   Imaging(Personally reviewed): MRI L-spine with contrast-enhancing nerve roots  Assessment   Roger Black is a 68 y.o. male presenting with neuropathic pain and found to have enhancing nerve roots of unclear etiology.  Main differentials  include inflammatory versus neoplastic.  He did have a pleocytosis and therefore was started on antibiotics, though my suspicion for infection is very low I would continue these while we are waiting cultures to be negative for 48 hours.  Ampicillin and acyclovir can be discontinued given that the meningitis/encephalitis panel tested for the organisms these would cover.  Recommendations  Discontinue acyclovir and ampicillin Await cytology If cytology is negative, would consider starting steroids May need to consider repeating LP if cytology is negative. Increase gabapentin to 900 3 times daily Start Cymbalta 60 mg daily ______________________________________________________________________   Signed, Aisha Seals, MD [facility]

## 2024-04-30 NOTE — Plan of Care (Signed)
  Problem: Education: Goal: Knowledge of General Education information will improve Description: Including pain rating scale, medication(s)/side effects and non-pharmacologic comfort measures Outcome: Progressing   Problem: Clinical Measurements: Goal: Will remain free from infection Outcome: Progressing   Problem: Clinical Measurements: Goal: Respiratory complications will improve Outcome: Progressing   Problem: Clinical Measurements: Goal: Cardiovascular complication will be avoided Outcome: Progressing   Problem: Activity: Goal: Risk for activity intolerance will decrease Outcome: Progressing   Problem: Pain Managment: Goal: General experience of comfort will improve and/or be controlled Outcome: Progressing

## 2024-04-30 NOTE — Evaluation (Signed)
 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 Pt was placed on gabapentin and Norco with minimal relief.  Now patient cannot walk due to severe pain and mild leg weakness    PMH significant HF with EF 45%, HTN, HLD, CAD, former smoker, prior lung cancer, COPD/asthma, GERD, CKD-3, skin cancer, obesity     Clinical Impressions Chart reviewed to date, pt greeted semi supine in bed, reporting fatigue but agreeable to OT evaluation. He is alert and oriented x4, appears anxious throughout. Pt reports he has been amb to the bathroom with RW today. Pt performs supine>sit with MIN A, upon attempting STS requiring MIN A with RW, pt knees appear to buckle and he returns to sitting. He is unable to tolerate static standing and declines to attempt standing again, reporting weakness and 10/10 pain. Nurse in room and notified. He performs lateral scoots on edge of bed with CGA. MMT of BLE presents with generalized weakness throughout. Pt performs grooming/feeding tasks with SET UP. Pt is performing ADL/functional mobility below PLOF, will benefit from acute OT to address functional deficits and to facilitate optimal ADL/functional mobility performance. Pt is left in bed with nurse present, all needs met. OT will follow.      If plan is discharge home, recommend the following:   A lot of help with bathing/dressing/bathroom;A lot of help with walking and/or transfers     Functional Status Assessment   Patient has had a recent decline in their functional status and demonstrates  the ability to make significant improvements in function in a reasonable and predictable amount of time.     Equipment Recommendations   BSC/3in1;Other (comment) (2WW)     Recommendations for Other Services         Precautions/Restrictions   Precautions Precautions: Fall Recall of Precautions/Restrictions: Intact Restrictions Weight Bearing Restrictions Per Provider Order: No     Mobility Bed Mobility Overal bed mobility: Modified Independent                  Transfers Overall transfer level: Needs assistance Equipment used: Rolling walker (2 wheels) Transfers: Sit to/from Stand Sit to Stand: Min assist, Mod assist           General transfer comment: unable to tolerate static standing, BLE buckle and he has to returns to sitting      Balance Overall balance assessment: Needs assistance Sitting-balance support: Feet supported Sitting balance-Leahy Scale: Good     Standing balance support: Bilateral upper extremity supported, During functional activity, Reliant on assistive device for balance Standing balance-Leahy Scale: Poor                             ADL either performed or assessed with clinical judgement   ADL Overall ADL's : Needs assistance/impaired Eating/Feeding: Set up   Grooming: Set up               Lower Body Dressing: Maximal assistance     Toilet Transfer Details (indicate cue type and reason): unable to tolerate on this date  General ADL Comments: limited by pain on this date     Vision Patient Visual Report: No change from baseline       Perception         Praxis         Pertinent Vitals/Pain Pain Assessment Pain Assessment: 0-10 Pain Score: 10-Worst pain ever Pain Location: B posterior legs (hamstring area) Pain Descriptors / Indicators: Spasm, Cramping Pain Intervention(s): Monitored during session, Repositioned (nurse in room to address)     Extremity/Trunk Assessment  Upper Extremity Assessment Upper Extremity Assessment: Overall WFL for tasks assessed   Lower Extremity Assessment Lower Extremity Assessment: Generalized weakness;RLE deficits/detail;LLE deficits/detail RLE Deficits / Details: 3+-4/5 throughout LLE Deficits / Details: 3+-4/5 throughout   Cervical / Trunk Assessment Cervical / Trunk Assessment: Normal   Communication Communication Communication: No apparent difficulties   Cognition Arousal: Alert Behavior During Therapy: WFL for tasks assessed/performed, Anxious Cognition: Cognition impaired           Executive functioning impairment (select all impairments): Problem solving                   Following commands: Intact       Cueing  General Comments   Cueing Techniques: Verbal cues  vss   Exercises Other Exercises Other Exercises: edu re role of OT, role of rehab, importance of continued attempts at mobility   Shoulder Instructions      Home Living Family/patient expects to be discharged to:: Private residence Living Arrangements: Spouse/significant other Available Help at Discharge: Family;Available 24 hours/day (wife is home on disability) Type of Home: House Home Access: Level entry     Home Layout: One level     Bathroom Shower/Tub: Chief Strategy Officer: Standard Bathroom Accessibility: Yes   Home Equipment: Cane - single point          Prior Functioning/Environment Prior Level of Function : Independent/Modified Independent;Working/employed             Mobility Comments: indep with no AD ADLs Comments: MOD I-I with ADL/IADL, works at [employer]    OT Problem List: Decreased strength;Decreased activity tolerance;Impaired balance (sitting and/or standing);Decreased knowledge of precautions;Decreased knowledge of use of DME or AE   OT Treatment/Interventions: Self-care/ADL training;Therapeutic exercise;Balance training;Therapeutic activities;DME and/or AE  instruction;Patient/family education;Energy conservation      OT Goals(Current goals can be found in the care plan section)   Acute Rehab OT Goals Patient Stated Goal: figure out what is going on OT Goal Formulation: With patient Time For Goal Achievement: 05/14/24 Potential to Achieve Goals: Good ADL Goals Pt Will Perform Grooming: with modified independence;sitting;standing Pt Will Perform Lower Body Dressing: with modified independence;sitting/lateral leans;sit to/from stand Pt Will Transfer to Toilet: with modified independence;ambulating Pt Will Perform Toileting - Clothing Manipulation and hygiene: with modified independence;sitting/lateral leans;sit to/from stand   OT Frequency:  Min 3X/week    Co-evaluation              AM-PAC OT 6 Clicks Daily Activity     Outcome Measure Help from another person eating meals?: None Help from another person taking care of personal grooming?: None Help from another person toileting, which includes using toliet, bedpan, or urinal?: A Little Help from another person bathing (including washing, rinsing, drying)?: A Little Help from another person to put on and taking off regular upper body clothing?: None Help from another person to put on and taking off regular lower body clothing?: A Lot 6 Click Score: 20  End of Session Equipment Utilized During Treatment: Rolling walker (2 wheels) Nurse Communication: Mobility status (team re: performance during OT tx session)  Activity Tolerance: Patient tolerated treatment well Patient left: in bed;with call bell/phone within reach;with bed alarm set;with nursing/sitter in room  OT Visit Diagnosis: Muscle weakness (generalized) (M62.81);Other abnormalities of gait and mobility (R26.89)                Time: 1243-1300 OT Time Calculation (min): 17 min Charges:  OT General Charges $OT Visit: 1 Visit OT Evaluation $OT Eval Moderate Complexity: 1 Mod  Therisa Sheffield, OTD OTR/L  04/30/24, 3:29  PM

## 2024-04-30 NOTE — Progress Notes (Signed)
 PROGRESS NOTE    Roger Black  FMW:[id number] DOB: [birth year] DOA: 04/28/2024 PCP: [name], MD   Brief Narrative:  Roger Black is a 68 y.o. male with medical history significant of dCHF with EF 45%, HTN, HLD, CAD, former smoker, prior lung cancer, COPD/asthma, GERD, CKD-3, skin cancer, obesity, who presents with slowly progressive and worsening leg pain for 1 month.  Now constant and pulsatile in nature, tingling/paresthesias also noted in both legs, essentially nonambulatory at this point. Hospitalist called for admission, neurology, interventional radiology, neurosurgery consulted and intake.  Assessment & Plan:   Principal Problem:   Leg pain Active Problems:   COPD (chronic obstructive pulmonary disease) (HCC)   HTN (hypertension)   Coronary artery disease   Hyperlipidemia   Chronic systolic CHF (congestive heart failure) (HCC)   Chronic kidney disease, stage 3a (HCC)   Obesity (BMI 30-39.9)   Abnormal findings on diagnostic imaging of spine   Back pain  Subacute onset intractable leg pain with enhancing lesions in lumbar spine Concurrent ambulatory dysfunction with - Imaging concerning for multilevel stenosis with enhancement of several roots of the cauda equina of unclear etiology - CSF studies notable for minimally elevated white count but glucose essentially normal, with elevated protein - Meningitis panel negative -discontinue acyclovir, ampicillin per discussion with neurology, continue ceftriaxone, vancomycin in the interim - Pathology currently pending, if no clear infection will initiate steroids in the next 12 hours - Pain currently poorly controlled, increased duloxetine and gabapentin with minimal improvement, lidocaine patch ongoing - No improvement on current regimen including Robaxin, IV morphine, p.o. oxycodone/acetaminophen - PT OT continue to follow, continues to be profoundly weak with marked pain upon movement -Will likely need  prolonged therapy given his deconditioning from baseline   COPD (chronic obstructive pulmonary disease) (HCC): Stable - Continue as needed bronchodilators/guaifenesin   HTN (hypertension) - Well-controlled on ARB, spironolactone; IV hydralazine as needed Coronary artery disease - Aspirin, Lipitor Hyperlipidemia - Lipitor  Chronic systolic CHF (congestive heart failure) (HCC) -not in acute exacerbation -most recent echo August 2025 with EF of 45%   Chronic kidney disease, stage 3a (HCC) -at baseline  Obesity (BMI 30-39.9): Class I  DVT prophylaxis: SCDs Start: 04/28/24 1947 Code Status:   Code Status: Full Code Family Communication: None present  Status is: Inpatient  Dispo: The patient is from: Home              Anticipated d/c is to: To be determined              Anticipated d/c date is: To be determined              Patient currently not medically stable for discharge  Consultants:  Neurology Neurosurgery Interventional radiology  Procedures:  LP 04/29/2024  Antimicrobials:  Ceftriaxone, vancomycin  Subjective: No acute issues or events overnight, leg pain ongoing without any real change from admission.  Denies headache fever chills chest pain shortness of breath nausea vomiting diarrhea or constipation  Objective: Vitals:   04/29/24 0737 04/29/24 2050 04/30/24 0400 04/30/24 0828  BP: 124/78 124/81 (!) 113/55 103/62  Pulse: 98   (!) 115  Resp: 17 20 18 18   Temp: 98.3 F (36.8 C) 97.9 F (36.6 C) 98.7 F (37.1 C)   TempSrc:  Oral    SpO2: 99% 95% 91% 91%  Weight:   95.3 kg   Height:        Intake/Output Summary (Last 24 hours) at 04/30/2024 1033 Last  data filed at 04/30/2024 0628 Gross per 24 hour  Intake 1427.43 ml  Output --  Net 1427.43 ml   Filed Weights   04/28/24 1418 04/29/24 0239 04/30/24 0400  Weight: 95.9 kg 95 kg 95.3 kg    Examination:  General:  Pleasantly resting in bed, No acute distress. HEENT:  Normocephalic atraumatic.  Sclerae  nonicteric, noninjected.  Extraocular movements intact bilaterally. Neck:  Without mass or deformity.  Trachea is midline. Lungs:  Clear to auscultate bilaterally without rhonchi, wheeze, or rales. Heart:  Regular rate and rhythm.  Without murmurs, rubs, or gallops. Abdomen:  Soft, nontender, nondistended.  Without guarding or rebound. Extremities: Without cyanosis, clubbing, edema, or obvious deformity. Skin:  Warm and dry, no erythema.  Data Reviewed: I have personally reviewed following labs and imaging studies  CBC: Recent Labs  Lab 04/28/24 1303 04/29/24 0056 04/30/24 0340  WBC 6.9 5.7 6.6  HGB 13.3 12.6* 12.5*  HCT 39.9 38.3* 37.7*  MCV 100.0 100.5* 99.2  PLT 160 152 154   Basic Metabolic Panel: Recent Labs  Lab 04/28/24 1303 04/29/24 0056 04/30/24 0340  NA 134* 135 135  K 4.8 4.9 5.0  CL 102 102 103  CO2 20* 26 23  GLUCOSE 120* 146* 112*  BUN 24* 27* 25*  CREATININE 1.36* 1.32* 1.26*  CALCIUM 9.5 9.4 9.1   GFR: Estimated Creatinine Clearance: 63.9 mL/min (A) (by C-G formula based on SCr of 1.26 mg/dL (H)). Liver Function Tests: Recent Labs  Lab 04/28/24 1303  AST 26  ALT 21  ALKPHOS 66  BILITOT 1.0  PROT 7.6  ALBUMIN 4.3   Coagulation Profile: Recent Labs  Lab 04/29/24 0056  INR 1.1   Recent Results (from the past 240 hours)  CSF culture w Gram Stain     Status: None (Preliminary result)   Collection Time: 04/29/24  1:05 PM   Specimen: PATH Cytology CSF; Cerebrospinal Fluid  Result Value Ref Range Status   Specimen Description   Final    FLUID Performed at [facility], [address] [place], [name] [address]    Special Requests   Final     CYTO CSF Performed at [facility], [address] [place], [name] [address]    Gram Stain   Final    RARE WBC PRESENT,BOTH PMN AND MONONUCLEAR NO ORGANISMS SEEN    Culture   Final    NO GROWTH < 24 HOURS Performed at [facility], [address], [place], [name]  [address]    Report Status PENDING  Incomplete         Radiology Studies: DG FL GUIDED LUMBAR PUNCTURE Result Date: 04/29/2024 CLINICAL DATA:  68 year old male with 2-3 weeks of progressively worsening aching/burning pain in bilateral legs. Denies any changes in gait or bowel or bladder incontinence. Recent MR lumbar spine with enhancement of several nerve roots of the cauda equina possibly secondary to underlying infectious or inflammatory process. Request for lumbar puncture. EXAM: LUMBAR PUNCTURE UNDER FLUOROSCOPY PROCEDURE: An appropriate skin entry site was determined fluoroscopically. Operator donned sterile gloves and mask. Skin site was marked, then prepped with Betadine, draped in usual sterile fashion, and infiltrated locally with 1% lidocaine. A 20 gauge spinal needle advanced into the thecal sac at L5-S1 from a left interlaminar approach. Clear colorless CSF spontaneously returned, with opening pressure of 22 cm water. 23 ml CSF were collected and divided among 4 sterile vials for the requested laboratory studies. Closing pressure of 9 cm water.  The needle was then removed. The patient tolerated the procedure well and there were no complications. FLUOROSCOPY: Radiation Exposure Index (as provided by the fluoroscopic device): 17.5 mGy Kerma IMPRESSION: Technically successful lumbar puncture under fluoroscopy. This exam was performed by [facility] PA-C, and was supervised and interpreted by Dr. Harrietta Sherry. Electronically Signed   By: Harrietta Sherry M.D.   On: 04/29/2024 16:55        Scheduled Meds:  aspirin EC  81 mg Oral Daily   atorvastatin  80 mg Oral Daily   doxazosin  2 mg Oral QHS   fluticasone furoate-vilanterol  1 puff Inhalation Daily   gabapentin  600 mg Oral TID   irbesartan  300 mg Oral Daily   lidocaine  1 patch Transdermal Q24H   montelukast  10 mg Oral Daily   pantoprazole  40 mg Oral Daily   spironolactone  50 mg Oral Daily   Continuous Infusions:   ampicillin (OMNIPEN) IV 2 g (04/30/24 0500)   cefTRIAXone (ROCEPHIN)  IV 2 g (04/30/24 0620)   lactated ringers Stopped (04/30/24 0243)   vancomycin       LOS: 1 day   Time spent:  Elsie JAYSON Montclair, DO [facility]  If 7PM-7AM, please contact night-coverage [email or web address]  04/30/2024, 10:33 AM

## 2024-05-01 ENCOUNTER — Inpatient Hospital Stay

## 2024-05-01 DIAGNOSIS — M79606 Pain in leg, unspecified: Secondary | ICD-10-CM

## 2024-05-01 DIAGNOSIS — M792 Neuralgia and neuritis, unspecified: Secondary | ICD-10-CM

## 2024-05-01 DIAGNOSIS — R9089 Other abnormal findings on diagnostic imaging of central nervous system: Secondary | ICD-10-CM | POA: Diagnosis not present

## 2024-05-01 LAB — COMPREHENSIVE METABOLIC PANEL WITH GFR
ALT: 15 U/L (ref 0–44)
AST: 23 U/L (ref 15–41)
Albumin: 3.6 g/dL (ref 3.5–5.0)
Alkaline Phosphatase: 48 U/L (ref 38–126)
Anion gap: 11 (ref 5–15)
BUN: 26 mg/dL — ABNORMAL HIGH (ref 8–23)
CO2: 22 mmol/L (ref 22–32)
Calcium: 8.9 mg/dL (ref 8.9–10.3)
Chloride: 101 mmol/L (ref 98–111)
Creatinine, Ser: 1.31 mg/dL — ABNORMAL HIGH (ref 0.61–1.24)
GFR, Estimated: 59 mL/min — ABNORMAL LOW (ref >60)
Glucose, Bld: 111 mg/dL — ABNORMAL HIGH (ref 70–99)
Potassium: 5 mmol/L (ref 3.5–5.1)
Sodium: 134 mmol/L — ABNORMAL LOW (ref 135–145)
Total Bilirubin: 1 mg/dL (ref 0.0–1.2)
Total Protein: 6.5 g/dL (ref 6.5–8.1)

## 2024-05-01 LAB — CBC WITH DIFFERENTIAL/PLATELET
Abs Immature Granulocytes: 0.03 K/uL (ref 0.00–0.07)
Basophils Absolute: 0.1 K/uL (ref 0.0–0.1)
Basophils Relative: 1 %
Eosinophils Absolute: 0.3 K/uL (ref 0.0–0.5)
Eosinophils Relative: 5 %
HCT: 35.3 % — ABNORMAL LOW (ref 39.0–52.0)
Hemoglobin: 11.8 g/dL — ABNORMAL LOW (ref 13.0–17.0)
Immature Granulocytes: 1 %
Lymphocytes Relative: 19 %
Lymphs Abs: 1.3 K/uL (ref 0.7–4.0)
MCH: 33 pg (ref 26.0–34.0)
MCHC: 33.4 g/dL (ref 30.0–36.0)
MCV: 98.6 fL (ref 80.0–100.0)
Monocytes Absolute: 0.5 K/uL (ref 0.1–1.0)
Monocytes Relative: 8 %
Neutro Abs: 4.4 K/uL (ref 1.7–7.7)
Neutrophils Relative %: 66 %
Platelets: 140 K/uL — ABNORMAL LOW (ref 150–400)
RBC: 3.58 MIL/uL — ABNORMAL LOW (ref 4.22–5.81)
RDW: 12.6 % (ref 11.5–15.5)
WBC: 6.5 K/uL (ref 4.0–10.5)
nRBC: 0 % (ref 0.0–0.2)

## 2024-05-01 MED ORDER — LIDOCAINE 1 % OPTIME INJ - NO CHARGE
5.0000 mL | Freq: Once | INTRAMUSCULAR | Status: AC
  Administered 2024-05-01: 5 mL via SUBCUTANEOUS

## 2024-05-01 MED ORDER — PREGABALIN 50 MG PO CAPS
100.0000 mg | ORAL_CAPSULE | Freq: Three times a day (TID) | ORAL | Status: DC
  Administered 2024-05-01 – 2024-05-03 (×6): 100 mg via ORAL
  Filled 2024-05-01 (×6): qty 2

## 2024-05-01 MED ORDER — LIDOCAINE HCL (PF) 1 % IJ SOLN
5.0000 mL | Freq: Once | INTRAMUSCULAR | Status: AC
  Administered 2024-05-01: 5 mL via SUBCUTANEOUS
  Filled 2024-05-01: qty 5

## 2024-05-01 NOTE — Progress Notes (Signed)
 Physical Therapy Treatment Patient Details Name: Roger Black MRN: [id number] DOB: 04/25/1956 Today's Date: 05/01/2024   History of Present Illness Pt is a 68 year old male presents with leg pani; Pt was seen by his PCP who ordered MRI-L [place] on 10/3, which showed multilevel stenosis with enhancement of several nerve roots of the cauda equina without nodular masslike enhancement which radiology notes may be secondary to underlying infectious or inflammatory process. Pt was placed on gabapentin and Norco with minimal relief.  Now patient cannot walk due to severe pain and mild leg weakness    PMH significant HF with EF 45%, HTN, HLD, CAD, former smoker, prior lung cancer, COPD/asthma, GERD, CKD-3, skin cancer, obesity    PT Comments  Patient is agreeable to PT session. He reports walking laps in the hallway last night. He walked a lap in the hallway today but does report lower back/low abdominal pain which he reports occurs historically before leg weakness and knee buckling. No physical assistance required during this visit today. Noted he had difficulty with standing with knee buckling yesterday during OT visit. Recommend to continue PT to maximize independence and facilitate return to prior level of function.    If plan is discharge home, recommend the following: A little help with walking and/or transfers   Can travel by private vehicle        Equipment Recommendations  Rolling walker (2 wheels)    Recommendations for Other Services       Precautions / Restrictions Precautions Precautions: Fall Recall of Precautions/Restrictions: Intact Restrictions Weight Bearing Restrictions Per Provider Order: No     Mobility  Bed Mobility Overal bed mobility: Modified Independent                  Transfers Overall transfer level: Needs assistance Equipment used: None Transfers: Sit to/from Stand Sit to Stand: Contact guard assist           General transfer comment:  good safety awareness    Ambulation/Gait Ambulation/Gait assistance: Supervision Gait Distance (Feet): 175 Feet Assistive device: IV Pole Gait Pattern/deviations: WFL(Within Functional Limits) Gait velocity: decreased     General Gait Details: patient declined using rolling walker and prefer the IV pole, although not truly needed for support. he does report lower back/abdomen pain with mobility which he reports occurs before knees buckle historically. he reports there are periods of increased leg weakness at times during the day. encourgaed rolling walker if having leg weakness.   Stairs             Wheelchair Mobility     Tilt Bed    Modified Rankin (Stroke Patients Only)       Balance Overall balance assessment: Needs assistance Sitting-balance support: Feet supported Sitting balance-Leahy Scale: Good     Standing balance support: No upper extremity supported Standing balance-Leahy Scale: Fair                              Hotel manager: No apparent difficulties  Cognition Arousal: Alert Behavior During Therapy: WFL for tasks assessed/performed, Anxious   PT - Cognitive impairments: No apparent impairments                         Following commands: Intact      Cueing Cueing Techniques: Verbal cues  Exercises      General Comments        Pertinent  Vitals/Pain Pain Assessment Pain Assessment: Faces Faces Pain Scale: Hurts little more Pain Location: lower back, both hamstrings Pain Descriptors / Indicators: Discomfort Pain Intervention(s): Monitored during session, Limited activity within patient's tolerance, Repositioned    Home Living                          Prior Function            PT Goals (current goals can now be found in the care plan section) Acute Rehab PT Goals Patient Stated Goal: to figure out what is going on PT Goal Formulation: With patient Time For Goal  Achievement: 05/14/24 Potential to Achieve Goals: Good Progress towards PT goals: Progressing toward goals    Frequency    Min 2X/week      PT Plan      Co-evaluation              AM-PAC PT 6 Clicks Mobility   Outcome Measure  Help needed turning from your back to your side while in a flat bed without using bedrails?: None Help needed moving from lying on your back to sitting on the side of a flat bed without using bedrails?: None Help needed moving to and from a bed to a chair (including a wheelchair)?: None Help needed standing up from a chair using your arms (e.g., wheelchair or bedside chair)?: A Little Help needed to walk in hospital room?: A Little Help needed climbing 3-5 steps with a railing? : A Little 6 Click Score: 21    End of Session   Activity Tolerance: Patient limited by fatigue Patient left: in bed;with call bell/phone within reach   PT Visit Diagnosis: Pain     Time: 1028-1050 PT Time Calculation (min) (ACUTE ONLY): 22 min  Charges:    $Therapeutic Activity: 8-22 mins PT General Charges $$ ACUTE PT VISIT: 1 Visit                     Randine Essex, PT, MPT    Randine LULLA Essex 05/01/2024, 12:13 PM

## 2024-05-01 NOTE — Plan of Care (Signed)

## 2024-05-01 NOTE — Plan of Care (Signed)

## 2024-05-01 NOTE — Progress Notes (Signed)
 Occupational Therapy Treatment Patient Details Name: Roger Black MRN: [id number] DOB: [birth year] Today's Date: 05/01/2024   History of present illness Pt is a 68 year old male presents with leg pani; Pt was seen by his PCP who ordered MRI-L [place] on 10/3, which showed multilevel stenosis with enhancement of several nerve roots of the cauda equina without nodular masslike enhancement which radiology notes may be secondary to underlying infectious or inflammatory process. Pt was placed on gabapentin and Norco with minimal relief.  Now patient cannot walk due to severe pain and mild leg weakness    PMH significant HF with EF 45%, HTN, HLD, CAD, former smoker, prior lung cancer, COPD/asthma, GERD, CKD-3, skin cancer, obesity   OT comments  Chart reviewed to date, pt greeted semi supine in bed, agreeable to OT tx session targeting improving functional activity tolerance in prep for ADL tasks. Pt reports his knees locked up on him trying to stand from toilet earlier. He amb with this therapist approx 200' with no AD with CGA. Educated pt on use of bsc and tub/transfer bench or shower chair for bathing. Pt is making progress towards goals, discharge recommendation updated.       If plan is discharge home, recommend the following:  A lot of help with bathing/dressing/bathroom;A lot of help with walking and/or transfers   Equipment Recommendations  BSC/3in1;Tub/shower bench; 2WW   Recommendations for Other Services      Precautions / Restrictions Precautions Precautions: Fall Recall of Precautions/Restrictions: Intact Restrictions Weight Bearing Restrictions Per Provider Order: No       Mobility Bed Mobility Overal bed mobility: Modified Independent                  Transfers Overall transfer level: Needs assistance Equipment used: None Transfers: Sit to/from Stand Sit to Stand: Contact guard assist                 Balance Overall balance assessment: Needs  assistance Sitting-balance support: Feet supported Sitting balance-Leahy Scale: Good     Standing balance support: No upper extremity supported Standing balance-Leahy Scale: Fair                             ADL either performed or assessed with clinical judgement   ADL Overall ADL's : Needs assistance/impaired Eating/Feeding: Set up   Grooming: Set up               Lower Body Dressing: Supervision/safety;Sitting/lateral leans   Toilet Transfer: Supervision/safety;Contact guard assist;Ambulation Toilet Transfer Details (indicate cue type and reason): simulated         Functional mobility during ADLs: Supervision/safety;Contact guard assist (approx 200' with no AD)      Extremity/Trunk Assessment              Vision       Perception     Praxis     Communication Communication Communication: No apparent difficulties   Cognition Arousal: Alert Behavior During Therapy: WFL for tasks assessed/performed Cognition: No apparent impairments                               Following commands: Intact        Cueing   Cueing Techniques: Verbal cues  Exercises Other Exercises Other Exercises: edu re role of OT, role of rehab, discharge recommendations    Shoulder Instructions  General Comments vss    Pertinent Vitals/ Pain       Pain Assessment Pain Assessment: No/denies pain Pain Location: does report some catching in R hip flexors while amb  Home Living                                          Prior Functioning/Environment              Frequency  Min 3X/week        Progress Toward Goals  OT Goals(current goals can now be found in the care plan section)  Progress towards OT goals: Progressing toward goals  Acute Rehab OT Goals Time For Goal Achievement: 05/14/24  Plan      Co-evaluation                 AM-PAC OT 6 Clicks Daily Activity     Outcome Measure   Help from  another person eating meals?: None Help from another person taking care of personal grooming?: None Help from another person toileting, which includes using toliet, bedpan, or urinal?: None Help from another person bathing (including washing, rinsing, drying)?: None Help from another person to put on and taking off regular upper body clothing?: None Help from another person to put on and taking off regular lower body clothing?: A Little 6 Click Score: 23    End of Session    OT Visit Diagnosis: Muscle weakness (generalized) (M62.81);Other abnormalities of gait and mobility (R26.89)   Activity Tolerance Patient tolerated treatment well   Patient Left in bed;with call bell/phone within reach   Nurse Communication Mobility status        Time: 8589-8581 OT Time Calculation (min): 8 min  Charges: OT General Charges $OT Visit: 1 Visit OT Treatments $Therapeutic Activity: 8-22 mins  Therisa Sheffield, OTD OTR/L  05/01/24, 2:46 PM

## 2024-05-01 NOTE — Care Management Important Message (Signed)
 Important Message  Patient Details  Name: Roger Black MRN: [id number] Date of Birth: 1955/12/31   Important Message Given:  Yes - Medicare IM     [name] 05/01/2024, 1:02 PM

## 2024-05-01 NOTE — Progress Notes (Addendum)
 PROGRESS NOTE Roger Black  FMW:[id number] DOB: June 29, 1956 DOA: 04/28/2024 PCP: Roger [name], MD  Brief Narrative:  Roger Black is a 68 y.o. male with medical history significant of dCHF with EF 45%, HTN, HLD, CAD, former smoker, prior lung cancer, COPD/asthma, GERD, CKD-3, skin cancer, obesity, who presents with slowly progressive and worsening leg pain for 1 month. Hospitalist called for admission, neurology, interventional radiology, neurosurgery consulted and intake.  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 Potentially inflammatory vs neoplastic and less likely infectious at this point.  - neurosurgery was consulted on presentation and recommended LP and neurology consult.  - IR completed LP- CSF studies notable for minimally elevated white count but glucose essentially normal, with elevated protein. Meningitis panel negative -discontinue acyclovir, ampicillin. continue ceftriaxone, vancomycin in the interim. - neurology following- recommending discontinuation of Abx if cultures remain negative x48 hours and repeat LP today to evaluate cytology and flow cytometry.  - pain is still poorly controlled but improving. Changed from gabapentin to lyrica. Continuing cymbalta - continue PT/OT- recommending HH - of note, patient has a PET scan scheduled for 10/29    COPD (chronic obstructive pulmonary disease) (HCC): Stable - Continue as needed bronchodilators/guaifenesin - routine f/u scheduled with pulmonology 11/19   HTN - Well-controlled on ARB, spironolactone CAD  HLD- Aspirin, Lipitor  Chronic systolic CHF (congestive heart failure) (HCC) -not in acute exacerbation -most recent echo August 2025 with EF of 45%   Chronic kidney disease, stage 3a (HCC) -at baseline   Obesity (BMI 30-39.9): Class I  DVT prophylaxis: SCDs Start: 04/28/24 1947 Code Status:   Code Status: Full Code Family Communication: None present  Status is: Inpatient  Dispo: The patient is from: Home              Anticipated d/c is to: To be determined              Anticipated d/c date is: To be determined              Patient currently not medically stable for discharge  Consultants:  Neurology Neurosurgery Interventional radiology  Procedures:  LP 10/7, 10/9  Antimicrobials:  Ceftriaxone, vancomycin  Subjective: Pt reports still significant pain impacting his sleep. Wasn't able to rest all night. Has several episodes of shooting pain down right leg during our encounter. He was able to ambulate with PT around nurses station looking comfortable.   Objective: Vitals:   04/30/24 2037 04/30/24 2216 05/01/24 0409 05/01/24 0753  BP: 104/65 107/74 (!) 142/85 (!) 142/74  Pulse:  88 97 (!) 101  Resp: 18 16 14 18   Temp: 97.9 F (36.6 C) 97.6 F (36.4 C) 98.5 F (36.9 C) 98.5 F (36.9 C)  TempSrc:  Oral    SpO2: 95% 94% 92% (!) 89%  Weight:      Height:        Intake/Output Summary (Last 24 hours) at 05/01/2024 0809 Last data filed at 05/01/2024 0327 Gross per 24 hour  Intake 1286.71 ml  Output --  Net 1286.71 ml   Filed Weights   04/28/24 1418 04/29/24 0239 04/30/24 0400  Weight: 95.9 kg 95 kg 95.3 kg    Examination:  General:  Pleasantly resting in bed, No acute distress. HEENT:  Normocephalic atraumatic.  Sclerae nonicteric, noninjected.  Extraocular movements intact  bilaterally. Neck:  Without mass or deformity.  Trachea is midline. Lungs:  Clear to auscultate bilaterally without rhonchi, wheeze, or rales. Heart:  Regular rate and rhythm.  Without murmurs, rubs, or gallops. Abdomen:  Soft, nontender, nondistended.  Without guarding or rebound. Extremities: Without cyanosis, clubbing, edema, or obvious deformity. Skin:  Warm and dry, no erythema.  Data  Reviewed: I have personally reviewed following labs and imaging studies  CBC: Recent Labs  Lab 04/28/24 1303 04/29/24 0056 04/30/24 0340 05/01/24 0320  WBC 6.9 5.7 6.6 6.5  NEUTROABS  --   --   --  4.4  HGB 13.3 12.6* 12.5* 11.8*  HCT 39.9 38.3* 37.7* 35.3*  MCV 100.0 100.5* 99.2 98.6  PLT 160 152 154 140*   Basic Metabolic Panel: Recent Labs  Lab 04/28/24 1303 04/29/24 0056 04/30/24 0340 05/01/24 0320  NA 134* 135 135 134*  K 4.8 4.9 5.0 5.0  CL 102 102 103 101  CO2 20* 26 23 22   GLUCOSE 120* 146* 112* 111*  BUN 24* 27* 25* 26*  CREATININE 1.36* 1.32* 1.26* 1.31*  CALCIUM 9.5 9.4 9.1 8.9    LOS: 2 days   Time spent:  [name], DO [facility]  If 7PM-7AM, please contact night-coverage [email or web address]  05/01/2024, 8:09 AM

## 2024-05-01 NOTE — Procedures (Signed)
 PROCEDURE SUMMARY:  Successful fluoroscopic guided lumbar puncture. No immediate complications.  Pt tolerated well.   EBL = none  Please see full dictation in imaging section of Epic for procedure details.

## 2024-05-01 NOTE — Progress Notes (Signed)
 NEUROLOGY CONSULT FOLLOW UP NOTE   Date of service: May 01, 2024 Patient Name: Roger Black MRN:  [id number] DOB:  1955/08/01  Interval Hx/subjective   Still having significant pain, LP showed atypical cells, recommendations from pathology were to repeat LP   Vitals   Vitals:   04/30/24 2037 04/30/24 2216 05/01/24 0409 05/01/24 0753  BP: 104/65 107/74 (!) 142/85 (!) 142/74  Pulse:  88 97 (!) 101  Resp: 18 16 14 18   Temp: 97.9 F (36.6 C) 97.6 F (36.4 C) 98.5 F (36.9 C) 98.5 F (36.9 C)  TempSrc:  Oral    SpO2: 95% 94% 92% (!) 89%  Weight:      Height:         Body mass index is 31.03 kg/m.  Physical Exam   Constitutional: Appears well-developed and well-nourished.  Neurologic Examination    MS: alert awake and interactive CN: EOMI, VFF, face symmetric Motor: Moves all extremities well but his lower extremity strength exam is limited due to pain Sensory: Intact to light touch and temperature  Medications  Current Facility-Administered Medications:    acetaminophen (TYLENOL) tablet 650 mg, 650 mg, Oral, Q6H PRN, Niu, Xilin, MD   albuterol (PROVENTIL) (2.5 MG/3ML) 0.083% nebulizer solution 2.5 mg, 2.5 mg, Nebulization, Q4H PRN, Niu, Xilin, MD   aspirin EC tablet 81 mg, 81 mg, Oral, Daily, Niu, Xilin, MD, 81 mg at 05/01/24 9178   atorvastatin (LIPITOR) tablet 80 mg, 80 mg, Oral, Daily, Niu, Xilin, MD, 80 mg at 05/01/24 0820   cefTRIAXone (ROCEPHIN) 2 g in sodium chloride  0.9 % 100 mL IVPB, 2 g, Intravenous, Q12H, Ponnala, Shruthi, MD, Last Rate: 200 mL/hr at 05/01/24 0422, 2 g at 05/01/24 0422   dextromethorphan-guaiFENesin (MUCINEX DM) 30-600 MG per 12 hr tablet 1 tablet, 1 tablet, Oral, BID PRN, Niu, Xilin, MD   doxazosin (CARDURA) tablet 2 mg, 2 mg, Oral, QHS, Niu, Xilin, MD, 2 mg at 04/30/24 2205   DULoxetine (CYMBALTA) DR capsule 60 mg, 60 mg, Oral, Daily, Michaela Aisha SQUIBB, MD, 60 mg at 05/01/24 0821   fluticasone furoate-vilanterol (BREO  ELLIPTA) 200-25 MCG/ACT 1 puff, 1 puff, Inhalation, Daily, Niu, Xilin, MD, 1 puff at 05/01/24 0829   hydrALAZINE (APRESOLINE) injection 5 mg, 5 mg, Intravenous, Q2H PRN, Niu, Xilin, MD   irbesartan (AVAPRO) tablet 300 mg, 300 mg, Oral, Daily, Niu, Xilin, MD, 300 mg at 05/01/24 0820   lactated ringers infusion, , Intravenous, Continuous, Ponnala, Shruthi, MD, Last Rate: 50 mL/hr at 05/01/24 0327, Infusion Verify at 05/01/24 0327   lidocaine (LIDODERM) 5 % 1 patch, 1 patch, Transdermal, Q24H, Niu, Xilin, MD, 1 patch at 04/29/24 2045   methocarbamol (ROBAXIN) tablet 500 mg, 500 mg, Oral, Q8H PRN, Niu, Xilin, MD, 500 mg at 05/01/24 0603   montelukast (SINGULAIR) tablet 10 mg, 10 mg, Oral, Daily, Niu, Xilin, MD, 10 mg at 05/01/24 0820   morphine (PF) 2 MG/ML injection 2 mg, 2 mg, Intravenous, Q4H PRN, Niu, Xilin, MD, 2 mg at 05/01/24 0824   ondansetron (ZOFRAN) injection 4 mg, 4 mg, Intravenous, Q8H PRN, Niu, Xilin, MD   oxyCODONE-acetaminophen (PERCOCET/ROXICET) 5-325 MG per tablet 1 tablet, 1 tablet, Oral, Q4H PRN, Niu, Xilin, MD, 1 tablet at 04/30/24 1754   pantoprazole (PROTONIX) EC tablet 40 mg, 40 mg, Oral, Daily, Niu, Xilin, MD, 40 mg at 05/01/24 0820   pregabalin (LYRICA) capsule 100 mg, 100 mg, Oral, TID, Michaela Aisha SQUIBB, MD   spironolactone (ALDACTONE) tablet 50 mg, 50 mg, Oral,  Daily, Niu, Xilin, MD, 50 mg at 05/01/24 0820   vancomycin (VANCOREADY) IVPB 1750 mg/350 mL, 1,750 mg, Intravenous, Q24H, Nada Adriana BIRCH, RPH, Last Rate: 175 mL/hr at 04/30/24 1306, 1,750 mg at 04/30/24 1306   zolpidem (AMBIEN) tablet 5 mg, 5 mg, Oral, QHS PRN, Niu, Xilin, MD  Labs and Diagnostic Imaging   CSF WBC 39 CSF RBC 0 CSF protein 94 CSF glucose 37  Fungal stain-pending Culture and fungus culture-pending Cytology -atypical  HIV nonreactive ESR and CRP are both normal   Imaging(Personally reviewed): MRI L-spine with contrast-enhancing nerve roots  Assessment   Roger Black is a  68 y.o. male presenting with neuropathic pain and found to have enhancing nerve roots of unclear etiology.  Main differentials include inflammatory versus neoplastic.  He did have a pleocytosis and therefore was started on antibiotics, though my suspicion for infection is very low I would continue these while we are waiting cultures to be negative for 48 hours.  Ampicillin and acyclovir can be discontinued given that the meningitis/encephalitis panel tested for the organisms these would cover.  Recommendations  Discontinue antibiotics if cultures remain negative at 48 hours Repeat LP today, send for flow cytometry and cytology with cellblock preparation I will try changing from gabapentin to Lyrica to see if we get more relief, I will use 100 mg 3 times daily starting at the time of his next dose of gabapentin would start Continue Cymbalta 60 mg daily ______________________________________________________________________   Signed, Aisha Seals, MD [facility]

## 2024-05-02 ENCOUNTER — Inpatient Hospital Stay

## 2024-05-02 DIAGNOSIS — M792 Neuralgia and neuritis, unspecified: Secondary | ICD-10-CM | POA: Diagnosis not present

## 2024-05-02 DIAGNOSIS — M50222 Other cervical disc displacement at C5-C6 level: Secondary | ICD-10-CM | POA: Diagnosis not present

## 2024-05-02 DIAGNOSIS — G9600 Cerebrospinal fluid leak, unspecified: Secondary | ICD-10-CM | POA: Diagnosis not present

## 2024-05-02 DIAGNOSIS — M5030 Other cervical disc degeneration, unspecified cervical region: Secondary | ICD-10-CM | POA: Diagnosis not present

## 2024-05-02 DIAGNOSIS — M79606 Pain in leg, unspecified: Secondary | ICD-10-CM | POA: Diagnosis not present

## 2024-05-02 DIAGNOSIS — R29898 Other symptoms and signs involving the musculoskeletal system: Secondary | ICD-10-CM | POA: Diagnosis not present

## 2024-05-02 DIAGNOSIS — R9089 Other abnormal findings on diagnostic imaging of central nervous system: Secondary | ICD-10-CM | POA: Diagnosis not present

## 2024-05-02 LAB — CBC WITH DIFFERENTIAL/PLATELET
Abs Immature Granulocytes: 0.03 K/uL (ref 0.00–0.07)
Basophils Absolute: 0 K/uL (ref 0.0–0.1)
Basophils Relative: 1 %
Eosinophils Absolute: 0.2 K/uL (ref 0.0–0.5)
Eosinophils Relative: 2 %
HCT: 35.9 % — ABNORMAL LOW (ref 39.0–52.0)
Hemoglobin: 12.1 g/dL — ABNORMAL LOW (ref 13.0–17.0)
Immature Granulocytes: 1 %
Lymphocytes Relative: 20 %
Lymphs Abs: 1.3 K/uL (ref 0.7–4.0)
MCH: 33 pg (ref 26.0–34.0)
MCHC: 33.7 g/dL (ref 30.0–36.0)
MCV: 97.8 fL (ref 80.0–100.0)
Monocytes Absolute: 0.5 K/uL (ref 0.1–1.0)
Monocytes Relative: 8 %
Neutro Abs: 4.5 K/uL (ref 1.7–7.7)
Neutrophils Relative %: 68 %
Platelets: 161 K/uL (ref 150–400)
RBC: 3.67 MIL/uL — ABNORMAL LOW (ref 4.22–5.81)
RDW: 12.4 % (ref 11.5–15.5)
WBC: 6.6 K/uL (ref 4.0–10.5)
nRBC: 0 % (ref 0.0–0.2)

## 2024-05-02 LAB — COMPREHENSIVE METABOLIC PANEL WITH GFR
ALT: 16 U/L (ref 0–44)
AST: 19 U/L (ref 15–41)
Albumin: 3.8 g/dL (ref 3.5–5.0)
Alkaline Phosphatase: 59 U/L (ref 38–126)
Anion gap: 9 (ref 5–15)
BUN: 24 mg/dL — ABNORMAL HIGH (ref 8–23)
CO2: 24 mmol/L (ref 22–32)
Calcium: 9.2 mg/dL (ref 8.9–10.3)
Chloride: 99 mmol/L (ref 98–111)
Creatinine, Ser: 1.12 mg/dL (ref 0.61–1.24)
GFR, Estimated: >60 mL/min (ref >60)
Glucose, Bld: 104 mg/dL — ABNORMAL HIGH (ref 70–99)
Potassium: 4.5 mmol/L (ref 3.5–5.1)
Sodium: 132 mmol/L — ABNORMAL LOW (ref 135–145)
Total Bilirubin: 0.8 mg/dL (ref 0.0–1.2)
Total Protein: 6.6 g/dL (ref 6.5–8.1)

## 2024-05-02 LAB — CSF CULTURE W GRAM STAIN: Culture: NO GROWTH

## 2024-05-02 LAB — MAGNESIUM: Magnesium: 1.9 mg/dL (ref 1.7–2.4)

## 2024-05-02 MED ORDER — GADOBUTROL 1 MMOL/ML IV SOLN
10.0000 mL | Freq: Once | INTRAVENOUS | Status: AC | PRN
  Administered 2024-05-02: 10 mL via INTRAVENOUS

## 2024-05-02 MED ORDER — SODIUM CHLORIDE 0.9 % IV SOLN: Freq: Once | INTRAVENOUS | Status: DC

## 2024-05-02 MED ORDER — SODIUM CHLORIDE 0.9 % IV SOLN
Freq: Once | INTRAVENOUS | Status: AC
  Filled 2024-05-02: qty 15

## 2024-05-02 MED ORDER — MAGNESIUM SULFATE 2 GM/50ML IV SOLN: 2.0000 g | Freq: Once | INTRAVENOUS | Status: DC

## 2024-05-02 NOTE — Progress Notes (Signed)
 Mobility Specialist - Progress Note   05/02/24 0933  Mobility  Activity Ambulated with assistance  Level of Assistance Contact guard assist, steadying assist  Assistive Device Front wheel walker  Distance Ambulated (ft) 30 ft  Activity Response Tolerated well  Mobility visit 1 Mobility  Mobility Specialist Start Time (ACUTE ONLY) [id number]  Mobility Specialist Stop Time (ACUTE ONLY) 0930  Mobility Specialist Time Calculation (min) (ACUTE ONLY) 14 min   Pt supine upon entry, utilizing RA. Pt STS to RW and amb a self selected ~15 ft in the hallway, opting to return to the room d/t BLE pain (10/10). Upon return to the room, Pt began to buckle requiring ModA to return supine--- expressed 8/10 pain once supine. Pt left supine with alarm set and needs within reach.  America Silvan Mobility Specialist 05/02/24 9:55 AM

## 2024-05-02 NOTE — Plan of Care (Signed)

## 2024-05-02 NOTE — Progress Notes (Signed)
 PROGRESS NOTE Roger Black  FMW:[id number] DOB: March 23, 1956 DOA: 04/28/2024 PCP: [name], MD  Brief Narrative:  Roger Black is a 68 y.o. male with medical history significant of dCHF with EF 45%, HTN, HLD, CAD, former smoker, prior lung cancer, COPD/asthma, GERD, CKD-3, skin cancer, obesity, who presents with slowly progressive and worsening leg pain for 1 month. Hospitalist called for admission, neurology, interventional radiology, neurosurgery consulted and intake.  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 Potentially inflammatory vs neoplastic and less likely infectious at this point.  - neurosurgery was consulted on presentation and recommended LP and neurology consult.  - IR completed LP- CSF studies notable for minimally elevated white count, atypical cells but glucose essentially normal, with elevated protein. Meningitis panel negative -discontinue acyclovir, ampicillin, ceftriaxone, vancomycin in the interim. - neurology following- recommending discontinuation of Abx, follow up on cytology and flow cytometry.  - pain is still poorly controlled but improving. Changed from gabapentin to lyrica. Continuing cymbalta - starting lidocaine infusion. Continue telemetry  - continue PT/OT- recommending HH - of note, patient has a PET scan scheduled for 10/29    COPD [facility]): Stable - Continue as needed bronchodilators/guaifenesin - routine f/u scheduled with pulmonology 11/19   HTN - Well-controlled on ARB, spironolactone CAD  HLD- Aspirin, Lipitor  Chronic systolic CHF (congestive heart failure) (HCC) -not in acute exacerbation -most recent echo August 2025 with EF of 45%   Chronic kidney disease, stage 3a (HCC) -at baseline  Obesity (BMI 30-39.9): Class  I  DVT prophylaxis: SCDs Start: 04/28/24 1947 Code Status:   Code Status: Full Code Family Communication: None present  Status is: Inpatient  Dispo: The patient is from: Home              Anticipated d/c is to: To be determined              Anticipated d/c date is: To be determined              Patient currently not medically stable for discharge  Consultants:  Neurology Neurosurgery Interventional radiology  Procedures:  LP 10/7, 10/9  Antimicrobials:  Ceftriaxone, vancomycin  Subjective: Pt reports feeling about the same today. Had walked with a tech in the hallway this morning when his legs felt like they were going to give out and has had a hard time recovering from this.   Objective: Vitals:   05/01/24 2134 05/02/24 0407 05/02/24 0500 05/02/24 0816  BP:  131/83  121/75  Pulse: 94 92  94  Resp:  17  18  Temp:  98.5 F (36.9 C)  98.3 F (36.8 C)  TempSrc:    Oral  SpO2: 94% 91%  93%  Weight:   95.3 kg   Height:        Intake/Output Summary (Last 24 hours) at 05/02/2024 0823 Last data filed at 05/01/2024 1700 Gross per 24 hour  Intake 450.88 ml  Output --  Net 450.88 ml   Filed Weights   04/29/24 0239 04/30/24 0400 05/02/24 0500  Weight: 95 kg 95.3 kg 95.3 kg    Examination:  General:  Pleasantly resting in bed, No acute distress. HEENT:  Normocephalic atraumatic.  Sclerae nonicteric, noninjected.  Extraocular movements intact bilaterally. Neck:  Without mass or deformity.  Trachea is midline. Lungs:  normal work  of breathing Extremities: Without cyanosis, clubbing, edema, or obvious deformity. Skin:  Warm and dry, no erythema.  Data Reviewed: I have personally reviewed following labs and imaging studies  CBC: Recent Labs  Lab 04/28/24 1303 04/29/24 0056 04/30/24 0340 05/01/24 0320 05/02/24 0439  WBC 6.9 5.7 6.6 6.5 6.6  NEUTROABS  --   --   --  4.4 4.5  HGB 13.3 12.6* 12.5* 11.8* 12.1*  HCT 39.9 38.3* 37.7* 35.3* 35.9*  MCV 100.0 100.5*  99.2 98.6 97.8  PLT 160 152 154 140* 161   Basic Metabolic Panel: Recent Labs  Lab 04/28/24 1303 04/29/24 0056 04/30/24 0340 05/01/24 0320 05/02/24 0439  NA 134* 135 135 134* 132*  K 4.8 4.9 5.0 5.0 4.5  CL 102 102 103 101 99  CO2 20* 26 23 22 24   GLUCOSE 120* 146* 112* 111* 104*  BUN 24* 27* 25* 26* 24*  CREATININE 1.36* 1.32* 1.26* 1.31* 1.12  CALCIUM 9.5 9.4 9.1 8.9 9.2    LOS: 3 days   Time spent:  [name], DO [facility]  If 7PM-7AM, please contact night-coverage [email or web address]  05/02/2024, 8:23 AM

## 2024-05-02 NOTE — TOC Initial Note (Signed)
 Transition of Care [facility]) - Initial/Assessment Note    Patient Details  Name: Roger Black MRN: [id number] Date of Birth: 12-12-1955  Transition of Care [facility]) CM/SW Contact:    [name], RN Phone Number: 05/02/2024, 3:00 PM  Clinical Narrative:                      Admitted qnm:Dlajrluz onset intractable leg pain with enhancing lesions in lumbar spine Concurrent ambulatory dysfunction Admitted from: Home with Wife  PCP: [name]   Current home health/prior home health/DME: NA  Therapy recommending outpatient PT.  Patient states that he is already established with outpatient at [facility].  Patient is agreeable to RW at discharge if sill indicated when medically ready for discharge    Patient Goals and CMS Choice            Expected Discharge Plan and Services                                              Prior Living Arrangements/Services                       Activities of Daily Living   ADL Screening (condition at time of admission) Independently performs ADLs?: No Is the patient deaf or have difficulty hearing?: No Does the patient have difficulty seeing, even when wearing glasses/contacts?: No Does the patient have difficulty concentrating, remembering, or making decisions?: No  Permission Sought/Granted                  Emotional Assessment              Admission diagnosis:  Back pain [M54.9] Midline low back pain with bilateral sciatica, unspecified chronicity [M54.41, M54.42] Patient Active Problem List   Diagnosis Date Noted   Abnormal findings on diagnostic imaging of spine 04/29/2024   Back pain 04/29/2024   Leg pain 04/28/2024   Obesity (BMI 30-39.9) 04/28/2024   HTN (hypertension) 04/28/2024   Hyperlipidemia    Chronic systolic CHF (congestive heart failure) (HCC)    Coronary artery disease    COPD (chronic obstructive pulmonary disease) (HCC)    Chronic kidney disease, stage 3a (HCC)    Personal  history of tobacco use, presenting hazards to health 09/21/2015   PCP:  [name], MD Pharmacy:   [id number] [name], [name] - [address] AT [facility] OF [name] [name] [address] Phone: [phone] Fax: [phone]  [facility] - [name], [name] - [name] [name] [address] Phone: [phone] Fax: [phone]     Social Drivers of Health (SDOH) Social History: SDOH Screenings   Food Insecurity: No Food Insecurity (04/29/2024)  Housing: Unknown (04/30/2024)  Transportation Needs: No Transportation Needs (04/29/2024)  Utilities: Not At Risk (04/29/2024)  Financial Resource Strain: Low Risk  (03/07/2024)   Received from [facility]  Recent Concern: Financial Resource Strain - High Risk (01/29/2024)   Received from [facility]  Physical Activity: Inactive (08/07/2023)   Received from [facility]  Social Connections: Unknown (04/30/2024)  Stress: Stress Concern Present (08/07/2023)   Received from [facility]  Tobacco Use: Medium Risk (04/28/2024)  Health Literacy: Adequate Health Literacy (08/07/2023)   Received  from [facility]   SDOH Interventions:     Readmission Risk Interventions     No data to display

## 2024-05-02 NOTE — Plan of Care (Signed)
  Problem: Education: Goal: Knowledge of General Education information will improve Description: Including pain rating scale, medication(s)/side effects and non-pharmacologic comfort measures 05/02/2024 2031 by Dorien Jeffry LABOR, RN Outcome: Progressing 05/02/2024 2025 by Dorien Jeffry LABOR, RN Outcome: Progressing   Problem: Health Behavior/Discharge Planning: Goal: Ability to manage health-related needs will improve 05/02/2024 2031 by Dorien Jeffry A, RN Outcome: Progressing 05/02/2024 2025 by Dorien Jeffry A, RN Outcome: Progressing   Problem: Clinical Measurements: Goal: Ability to maintain clinical measurements within normal limits will improve 05/02/2024 2031 by Dorien Jeffry LABOR, RN Outcome: Progressing 05/02/2024 2025 by Dorien Jeffry A, RN Outcome: Progressing Goal: Will remain free from infection 05/02/2024 2031 by Dorien Jeffry LABOR, RN Outcome: Progressing 05/02/2024 2025 by Dorien Jeffry A, RN Outcome: Progressing Goal: Diagnostic test results will improve 05/02/2024 2031 by Dorien Jeffry LABOR, RN Outcome: Progressing 05/02/2024 2025 by Dorien Jeffry A, RN Outcome: Progressing Goal: Respiratory complications will improve 05/02/2024 2031 by Dorien Jeffry LABOR, RN Outcome: Progressing 05/02/2024 2025 by Dorien Jeffry A, RN Outcome: Progressing Goal: Cardiovascular complication will be avoided 05/02/2024 2031 by Dorien Jeffry LABOR, RN Outcome: Progressing 05/02/2024 2025 by Dorien Jeffry LABOR, RN Outcome: Progressing   Problem: Activity: Goal: Risk for activity intolerance will decrease 05/02/2024 2031 by Dorien Jeffry A, RN Outcome: Progressing 05/02/2024 2025 by Dorien Jeffry A, RN Outcome: Progressing   Problem: Nutrition: Goal: Adequate nutrition will be maintained 05/02/2024 2031 by Dorien Jeffry LABOR, RN Outcome: Progressing 05/02/2024 2025 by Dorien Jeffry A, RN Outcome: Progressing   Problem:  Coping: Goal: Level of anxiety will decrease 05/02/2024 2031 by Dorien Jeffry A, RN Outcome: Progressing 05/02/2024 2025 by Dorien Jeffry A, RN Outcome: Progressing   Problem: Elimination: Goal: Will not experience complications related to bowel motility 05/02/2024 2031 by Dorien Jeffry LABOR, RN Outcome: Progressing 05/02/2024 2025 by Dorien Jeffry A, RN Outcome: Progressing Goal: Will not experience complications related to urinary retention 05/02/2024 2031 by Dorien Jeffry LABOR, RN Outcome: Progressing 05/02/2024 2025 by Dorien Jeffry A, RN Outcome: Progressing   Problem: Pain Managment: Goal: General experience of comfort will improve and/or be controlled 05/02/2024 2031 by Dorien Jeffry LABOR, RN Outcome: Progressing 05/02/2024 2025 by Dorien Jeffry A, RN Outcome: Progressing   Problem: Safety: Goal: Ability to remain free from injury will improve 05/02/2024 2031 by Dorien Jeffry LABOR, RN Outcome: Progressing 05/02/2024 2025 by Dorien Jeffry A, RN Outcome: Progressing   Problem: Skin Integrity: Goal: Risk for impaired skin integrity will decrease 05/02/2024 2031 by Dorien Jeffry LABOR, RN Outcome: Progressing 05/02/2024 2025 by Dorien Jeffry LABOR, RN Outcome: Progressing

## 2024-05-02 NOTE — Progress Notes (Addendum)
 NEUROLOGY CONSULT FOLLOW UP NOTE   Date of service: May 02, 2024 Patient Name: Aaran Enberg MRN:  [id number] DOB:  05-28-1956  Interval Hx/subjective   Repeat LP done yesterday, prep is beign done today, likely will not have report until Monday per pathology.    Vitals   Vitals:   05/02/24 0407 05/02/24 0500 05/02/24 0816 05/02/24 0944  BP: 131/83  121/75 115/72  Pulse: 92  94 96  Resp: 17  18 16   Temp: 98.5 F (36.9 C)  98.3 F (36.8 C) 97.8 F (36.6 C)  TempSrc:   Oral Oral  SpO2: 91%  93% 96%  Weight:  95.3 kg    Height:         Body mass index is 31.03 kg/m.  Physical Exam   Constitutional: Appears well-developed and well-nourished.  Neurologic Examination    MS: alert awake and interactive CN: EOMI, VFF, face symmetric Motor: Moves all extremities well, able to hold  legs against gravity without drift.  Sensory: Intact to light touch  DTR: intact except for right knee.   Medications  Current Facility-Administered Medications:    acetaminophen (TYLENOL) tablet 650 mg, 650 mg, Oral, Q6H PRN, Niu, Xilin, MD   albuterol (PROVENTIL) (2.5 MG/3ML) 0.083% nebulizer solution 2.5 mg, 2.5 mg, Nebulization, Q4H PRN, Niu, Xilin, MD   aspirin EC tablet 81 mg, 81 mg, Oral, Daily, Niu, Xilin, MD, 81 mg at 05/02/24 1118   atorvastatin (LIPITOR) tablet 80 mg, 80 mg, Oral, Daily, Niu, Xilin, MD, 80 mg at 05/02/24 1118   dextromethorphan-guaiFENesin (MUCINEX DM) 30-600 MG per 12 hr tablet 1 tablet, 1 tablet, Oral, BID PRN, Niu, Xilin, MD   doxazosin (CARDURA) tablet 2 mg, 2 mg, Oral, QHS, Niu, Xilin, MD, 2 mg at 05/01/24 2133   DULoxetine (CYMBALTA) DR capsule 60 mg, 60 mg, Oral, Daily, Michaela Aisha SQUIBB, MD, 60 mg at 05/02/24 1119   fluticasone furoate-vilanterol (BREO ELLIPTA) 200-25 MCG/ACT 1 puff, 1 puff, Inhalation, Daily, Niu, Xilin, MD, 1 puff at 05/02/24 1123   irbesartan (AVAPRO) tablet 300 mg, 300 mg, Oral, Daily, Niu, Xilin, MD, 300 mg at 05/02/24  1118   lidocaine (LIDODERM) 5 % 1 patch, 1 patch, Transdermal, Q24H, Niu, Xilin, MD, 1 patch at 04/29/24 2045   methocarbamol (ROBAXIN) tablet 500 mg, 500 mg, Oral, Q8H PRN, Niu, Xilin, MD, 500 mg at 05/02/24 9367   montelukast (SINGULAIR) tablet 10 mg, 10 mg, Oral, Daily, Niu, Xilin, MD, 10 mg at 05/02/24 1118   morphine (PF) 2 MG/ML injection 2 mg, 2 mg, Intravenous, Q4H PRN, Niu, Xilin, MD, 2 mg at 05/02/24 1119   ondansetron (ZOFRAN) injection 4 mg, 4 mg, Intravenous, Q8H PRN, Niu, Xilin, MD   oxyCODONE-acetaminophen (PERCOCET/ROXICET) 5-325 MG per tablet 1 tablet, 1 tablet, Oral, Q4H PRN, Niu, Xilin, MD, 1 tablet at 05/02/24 0421   pantoprazole (PROTONIX) EC tablet 40 mg, 40 mg, Oral, Daily, Niu, Xilin, MD, 40 mg at 05/02/24 1118   pregabalin (LYRICA) capsule 100 mg, 100 mg, Oral, TID, Michaela Aisha SQUIBB, MD, 100 mg at 05/02/24 1118   spironolactone (ALDACTONE) tablet 50 mg, 50 mg, Oral, Daily, Niu, Xilin, MD, 50 mg at 05/02/24 1118   zolpidem (AMBIEN) tablet 5 mg, 5 mg, Oral, QHS PRN, Niu, Xilin, MD  Labs and Diagnostic Imaging   CSF WBC 39 CSF RBC 0 CSF protein 94 CSF glucose 37  Fungal stain-pending Culture and fungus culture-pending Cytology -atypical  HIV nonreactive ESR and CRP are both normal  Imaging(Personally reviewed): MRI L-spine with contrast-enhancing nerve roots  Assessment   Keeon Zurn is a 68 y.o. male presenting with neuropathic pain and found to have enhancing nerve roots of unclear etiology.  Main differentials include inflammatory versus neoplastic.  Given duration, lack of ESR and CRP elevation, and no fever or white count, I think that infectious etiology is very unlikely.  His cytology did show atypical cells, raising concern for neoplastic process as etiology.  His pain is still not under good control, and I have had success with refractory neuropathic pain (e.g. trigeminal neuralgia) with IV lidocaine.  I discussed IV lidocaine and that  there was some risk of cardiovascular arrhythmia, but that the risk was relatively small.  He agreed with going ahead with medication as he is in a lot of pain, and I think this is more likely than other methods to get a fast result.  Recommendations  Continue Lyrica 100 mg 3 times daily  continue Cymbalta 60 mg daily MRI T and C-spine Check magnesium and start telemetry prior to IV lidocaine IV lidocaine 300 mg infused over an hour  _____________________________________________________________________   Signed, Aisha Seals, MD [facility]

## 2024-05-02 NOTE — Progress Notes (Signed)
 PT Cancellation Note  Patient Details Name: Roger Black MRN: [id number] DOB: 10/07/1955   Cancelled Treatment:    Reason Eval/Treat Not Completed: Patient at procedure or test/unavailable (MRI. PT will continue to follow up)  Roger Black, PT, MPT  Roger Black 05/02/2024, 12:37 PM

## 2024-05-03 DIAGNOSIS — M5441 Lumbago with sciatica, right side: Secondary | ICD-10-CM

## 2024-05-03 DIAGNOSIS — M5442 Lumbago with sciatica, left side: Secondary | ICD-10-CM | POA: Diagnosis not present

## 2024-05-03 DIAGNOSIS — M792 Neuralgia and neuritis, unspecified: Secondary | ICD-10-CM | POA: Diagnosis not present

## 2024-05-03 DIAGNOSIS — R9089 Other abnormal findings on diagnostic imaging of central nervous system: Secondary | ICD-10-CM | POA: Diagnosis not present

## 2024-05-03 MED ORDER — MORPHINE SULFATE (PF) 2 MG/ML IV SOLN
2.0000 mg | INTRAVENOUS | Status: DC | PRN
  Administered 2024-05-03 – 2024-05-06 (×6): 2 mg via INTRAVENOUS
  Filled 2024-05-03 (×7): qty 1

## 2024-05-03 MED ORDER — METHOCARBAMOL 500 MG PO TABS
500.0000 mg | ORAL_TABLET | Freq: Four times a day (QID) | ORAL | Status: DC
  Administered 2024-05-03 – 2024-05-06 (×15): 500 mg via ORAL
  Filled 2024-05-03 (×15): qty 1

## 2024-05-03 MED ORDER — PREGABALIN 75 MG PO CAPS
150.0000 mg | ORAL_CAPSULE | Freq: Three times a day (TID) | ORAL | Status: DC
  Administered 2024-05-03 – 2024-05-06 (×11): 150 mg via ORAL
  Filled 2024-05-03 (×11): qty 2

## 2024-05-03 MED ORDER — OXYCODONE-ACETAMINOPHEN 5-325 MG PO TABS
1.0000 | ORAL_TABLET | ORAL | Status: DC
  Administered 2024-05-03 – 2024-05-05 (×12): 1 via ORAL
  Filled 2024-05-03 (×14): qty 1

## 2024-05-03 NOTE — Plan of Care (Signed)

## 2024-05-03 NOTE — Progress Notes (Signed)
 NEUROLOGY CONSULT FOLLOW UP NOTE   Date of service: May 03, 2024 Patient Name: Roger Black MRN:  [id number] DOB:  02/15/1956  Interval Hx/subjective   Pain continues unabated, IV lidocaine 3 mg/kg did not help, not even partial relief   Vitals   Vitals:   05/02/24 1751 05/02/24 2026 05/03/24 0426 05/03/24 0755  BP: 129/81 117/79 132/75 122/79  Pulse: 88 94 94 95  Resp: 18 18 17 16   Temp: 98.2 F (36.8 C) 99 F (37.2 C) 98.1 F (36.7 C) 98.6 F (37 C)  TempSrc: Oral   Oral  SpO2: 91% 93% 93% 92%  Weight:      Height:         Body mass index is 31.03 kg/m.  Physical Exam   Constitutional: Appears well-developed and well-nourished.  Neurologic Examination    MS: alert awake and interactive CN: EOMI, VFF, face symmetric Motor: Moves all extremities well, able to hold  legs against gravity without drift.  Sensory: Intact to light touch  DTR: intact except for right patellar reflex which is absent  Medications  Current Facility-Administered Medications:    acetaminophen (TYLENOL) tablet 650 mg, 650 mg, Oral, Q6H PRN, Niu, Xilin, MD   albuterol (PROVENTIL) (2.5 MG/3ML) 0.083% nebulizer solution 2.5 mg, 2.5 mg, Nebulization, Q4H PRN, Niu, Xilin, MD   aspirin EC tablet 81 mg, 81 mg, Oral, Daily, Niu, Xilin, MD, 81 mg at 05/03/24 0915   atorvastatin (LIPITOR) tablet 80 mg, 80 mg, Oral, Daily, Niu, Xilin, MD, 80 mg at 05/03/24 9083   dextromethorphan-guaiFENesin (MUCINEX DM) 30-600 MG per 12 hr tablet 1 tablet, 1 tablet, Oral, BID PRN, Niu, Xilin, MD   doxazosin (CARDURA) tablet 2 mg, 2 mg, Oral, QHS, Niu, Xilin, MD, 2 mg at 05/02/24 2113   DULoxetine (CYMBALTA) DR capsule 60 mg, 60 mg, Oral, Daily, Michaela Aisha SQUIBB, MD, 60 mg at 05/03/24 0915   fluticasone furoate-vilanterol (BREO ELLIPTA) 200-25 MCG/ACT 1 puff, 1 puff, Inhalation, Daily, Niu, Xilin, MD, 1 puff at 05/03/24 0917   irbesartan (AVAPRO) tablet 300 mg, 300 mg, Oral, Daily, Niu, Xilin, MD,  300 mg at 05/03/24 0916   lidocaine (LIDODERM) 5 % 1 patch, 1 patch, Transdermal, Q24H, Niu, Xilin, MD, 1 patch at 04/29/24 2045   methocarbamol (ROBAXIN) tablet 500 mg, 500 mg, Oral, QID, Lenon Pons L, MD, 500 mg at 05/03/24 1045   montelukast (SINGULAIR) tablet 10 mg, 10 mg, Oral, Daily, Niu, Xilin, MD, 10 mg at 05/03/24 0916   morphine (PF) 2 MG/ML injection 2 mg, 2 mg, Intravenous, Q4H PRN, Lenon Pons CROME, MD, 2 mg at 05/03/24 1044   ondansetron (ZOFRAN) injection 4 mg, 4 mg, Intravenous, Q8H PRN, Niu, Xilin, MD   oxyCODONE-acetaminophen (PERCOCET/ROXICET) 5-325 MG per tablet 1 tablet, 1 tablet, Oral, Q4H, Anderson, Chelsey L, MD   pantoprazole (PROTONIX) EC tablet 40 mg, 40 mg, Oral, Daily, Niu, Xilin, MD, 40 mg at 05/03/24 0916   pregabalin (LYRICA) capsule 100 mg, 100 mg, Oral, TID, Michaela Aisha SQUIBB, MD, 100 mg at 05/03/24 9083   spironolactone (ALDACTONE) tablet 50 mg, 50 mg, Oral, Daily, Niu, Xilin, MD, 50 mg at 05/03/24 0916   zolpidem (AMBIEN) tablet 5 mg, 5 mg, Oral, QHS PRN, Niu, Xilin, MD, 5 mg at 05/02/24 2112  Labs and Diagnostic Imaging   CSF WBC 39 CSF RBC 0 CSF protein 94 CSF glucose 37  Fungal stain-pending Culture and fungus culture-pending Cytology -atypical  HIV nonreactive ESR and CRP are both normal  Imaging(Personally reviewed): MRI T-spine -enhancement of the thoracic spinal cord MRI L-spine with contrast-enhancing nerve roots  Assessment   Roger Black is a 68 y.o. male presenting with neuropathic pain and found to have enhancing nerve roots of unclear etiology.  Main differentials include inflammatory versus neoplastic.  Given duration, lack of ESR and CRP elevation, and no fever or white count, I think that infectious etiology is very unlikely.  His cytology did show atypical cells, raising concern for neoplastic process as etiology.  His pain is still not under good control, but our options are somewhat limited.  His renal  clearance is improving, and so I will increase his Lyrica to 150 3 times daily.  His lack of response to lidocaine makes me less hopeful that other sodium channel blockers would be likely to be effective.  Steroids I think would be a good next step, however if this does represent a lymphomatous process this could reduce the yield of a third lumbar puncture which I think would be reasonable in pursuit of a diagnosis if the second LP results are indeterminant.  I would therefore wait until we have more definitive answers prior to pursuing this.  Recommendations  Increase Lyrica to 150 3 times daily continue Cymbalta 60 mg daily Once cytology/flow cytometry results are available, could consider steroids  _____________________________________________________________________   Signed, Aisha Seals, MD [facility]

## 2024-05-03 NOTE — Progress Notes (Addendum)
 PROGRESS NOTE Roger Black  FMW:[id number] DOB: 1956-03-23 DOA: 04/28/2024 PCP: [name], MD  Brief Narrative:  Roger Black is a 68 y.o. male with medical history significant of dCHF with EF 45%, HTN, HLD, CAD, former smoker, prior lung cancer, COPD/asthma, GERD, CKD-3, skin cancer, obesity, who presents with slowly progressive and worsening leg pain for 1 month. Hospitalist called for admission, neurology, interventional radiology, neurosurgery consulted and intake.  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 Potentially inflammatory vs neoplastic and less likely infectious at this point.  - neurosurgery was consulted on presentation and recommended LP and neurology consult.  - IR completed LP- CSF studies notable for minimally elevated white count, atypical cells but glucose essentially normal, with elevated protein. Meningitis panel negative -discontinue acyclovir, ampicillin, ceftriaxone, vancomycin - neurology following- recommending discontinuation of Abx, follow up on cytology and flow cytometry.  - pain is still poorly controlled but improving. Changed from gabapentin to lyrica and increasing dose. Continuing cymbalta - s/p lidocaine infusion 10/10 without significant improvement.  - analgesia PRN - trial of heating pad for additional MSK pain from being essentially bed-bound - continue PT/OT- recommending HH - of note, patient has a PET scan scheduled for 10/29    COPD [facility]): Stable - Continue as needed bronchodilators/guaifenesin - routine f/u scheduled with pulmonology 11/19   HTN - Well-controlled on ARB, spironolactone CAD  HLD- Aspirin, Lipitor  Chronic systolic CHF (congestive heart failure) (HCC) -not in acute exacerbation -most recent echo  August 2025 with EF of 45%   Chronic kidney disease, stage 3a (HCC) -at baseline  Obesity (BMI 30-39.9): Class I  DVT prophylaxis: SCDs Start: 04/28/24 1947 Code Status:   Code Status: Full Code Family Communication: None present  Status is: Inpatient  Dispo: The patient is from: Home              Anticipated d/c is to: To be determined              Anticipated d/c date is: To be determined              Patient currently not medically stable for discharge  Consultants:  Neurology Neurosurgery Interventional radiology  Procedures:  LP 10/7, 10/9  Antimicrobials:  Ceftriaxone, vancomycin  Subjective: Pt reports no changes in his status. He would appreciate scheduled pain medications rather than PRN. Has not been up to walk today. Declined anxiety treatment due to poor response in the past  Objective: Vitals:   05/02/24 1751 05/02/24 2026 05/03/24 0426 05/03/24 0755  BP: 129/81 117/79 132/75 122/79  Pulse: 88 94 94 95  Resp: 18 18 17 16   Temp: 98.2 F (36.8 C) 99 F (37.2 C) 98.1 F (36.7 C) 98.6 F (37 C)  TempSrc: Oral   Oral  SpO2: 91% 93% 93% 92%  Weight:      Height:        Intake/Output Summary (Last 24 hours) at 05/03/2024 0755 Last data filed at 05/03/2024 0400 Gross per 24 hour  Intake --  Output 750 ml  Net -750 ml   Filed Weights   04/29/24 0239 04/30/24 0400 05/02/24 0500  Weight: 95 kg 95.3 kg 95.3 kg   Examination:  General: alert, resting in bed, No acute distress. HEENT:  Normocephalic atraumatic.  Sclerae nonicteric, noninjected.  Extraocular movements  intact bilaterally. CV: peripherally well perfused Lungs:  normal work of breathing Extremities: Without cyanosis, clubbing, edema, or obvious deformity. Skin:  Warm and dry, no erythema.  Data Reviewed: I have personally reviewed following labs and imaging studies  CBC: Recent Labs  Lab 04/28/24 1303 04/29/24 0056 04/30/24 0340 05/01/24 0320 05/02/24 0439  WBC 6.9 5.7 6.6 6.5  6.6  NEUTROABS  --   --   --  4.4 4.5  HGB 13.3 12.6* 12.5* 11.8* 12.1*  HCT 39.9 38.3* 37.7* 35.3* 35.9*  MCV 100.0 100.5* 99.2 98.6 97.8  PLT 160 152 154 140* 161   Basic Metabolic Panel: Recent Labs  Lab 04/28/24 1303 04/29/24 0056 04/30/24 0340 05/01/24 0320 05/02/24 0439  NA 134* 135 135 134* 132*  K 4.8 4.9 5.0 5.0 4.5  CL 102 102 103 101 99  CO2 20* 26 23 22 24   GLUCOSE 120* 146* 112* 111* 104*  BUN 24* 27* 25* 26* 24*  CREATININE 1.36* 1.32* 1.26* 1.31* 1.12  CALCIUM 9.5 9.4 9.1 8.9 9.2  MG  --   --   --   --  1.9    LOS: 4 days   Time spent:  [name], DO [facility]  If 7PM-7AM, please contact night-coverage [email or web address]  05/03/2024, 7:55 AM

## 2024-05-04 DIAGNOSIS — M5441 Lumbago with sciatica, right side: Secondary | ICD-10-CM | POA: Diagnosis not present

## 2024-05-04 DIAGNOSIS — M5442 Lumbago with sciatica, left side: Secondary | ICD-10-CM | POA: Diagnosis not present

## 2024-05-04 MED ORDER — POLYETHYLENE GLYCOL 3350 17 G PO PACK
17.0000 g | PACK | Freq: Two times a day (BID) | ORAL | Status: DC
  Administered 2024-05-04 – 2024-05-06 (×2): 17 g via ORAL
  Filled 2024-05-04 (×5): qty 1

## 2024-05-04 NOTE — Progress Notes (Signed)
 PROGRESS NOTE [name]  FMW:[id number] DOB: 19-Feb-1956 DOA: 04/28/2024 PCP: [name], MD  Brief Narrative:  Roger Black is a 68 y.o. male with medical history significant of dCHF with EF 45%, HTN, HLD, CAD, former smoker, prior lung cancer, COPD/asthma, GERD, CKD-3, skin cancer, obesity, who presents with slowly progressive and worsening leg pain for 1 month. Hospitalist called for admission, neurology, interventional radiology, neurosurgery consulted and intake.  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 Potentially inflammatory vs neoplastic and less likely infectious at this point.  - neurosurgery was consulted on presentation and recommended LP and neurology consult.  - IR completed LP- CSF studies notable for minimally elevated white count, atypical cells but glucose essentially normal, with elevated protein. Meningitis panel negative -discontinue acyclovir, ampicillin, ceftriaxone, vancomycin - neurology following- recommending discontinuation of Abx, follow up on cytology and flow cytometry.  - hopefully cytology results return tomorrow. If inconclusive will proceed with another LP. If not needing another one, consider steroids course. - pain is still poorly controlled but improving. Changed from gabapentin to lyrica and increasing dose. Continuing cymbalta - s/p lidocaine infusion 10/10 without significant improvement.  - analgesia PRN - trial of heating pad for additional MSK pain from being essentially bed-bound - continue PT/OT- recommending HH - of note, patient has a PET scan scheduled for 10/29    COPD [facility]): Stable - Continue as needed bronchodilators/guaifenesin - routine f/u scheduled with pulmonology 11/19   HTN - Well-controlled on ARB,  spironolactone CAD  HLD- Aspirin, Lipitor  Chronic systolic CHF (congestive heart failure) (HCC) -not in acute exacerbation -most recent echo August 2025 with EF of 45%   Chronic kidney disease, stage 3a (HCC) -at baseline  Obesity (BMI 30-39.9): Class I  DVT prophylaxis: SCDs Start: 04/28/24 1947 Code Status:   Code Status: Full Code Family Communication: None present  Status is: Inpatient  Dispo: The patient is from: Home              Anticipated d/c is to: To be determined              Anticipated d/c date is: To be determined              Patient currently not medically stable for discharge  Consultants:  Neurology Neurosurgery Interventional radiology  Procedures:  LP 10/7, 10/9  Antimicrobials:  Ceftriaxone, vancomycin  Subjective: Pt reports no changes in his status. He has not been out of bed except for to the bathroom today.   Objective: Vitals:   05/03/24 0755 05/03/24 1615 05/03/24 2055 05/04/24 0328  BP: 122/79 129/83 128/82 (!) 111/55  Pulse: 95 78 77 96  Resp: 16 14 16 16   Temp: 98.6 F (37 C) 97.8 F (36.6 C) 98 F (36.7 C) 97.9 F (36.6 C)  TempSrc: Oral Oral Oral   SpO2: 92% 91% 98% 98%  Weight:      Height:       No intake or output data in the 24 hours ending 05/04/24 0822  Filed Weights   04/29/24 0239 04/30/24 0400 05/02/24 0500  Weight: 95 kg 95.3 kg 95.3 kg   Examination: General: alert, resting in bed, No acute distress. HEENT:  Normocephalic atraumatic.  Sclerae nonicteric, noninjected.  Extraocular movements intact bilaterally. CV: peripherally well perfused Lungs:  normal work  of breathing Extremities: Without cyanosis, clubbing, edema, or obvious deformity. Skin:  Warm and dry, no erythema.  Data Reviewed: I have personally reviewed following labs and imaging studies  CBC: Recent Labs  Lab 04/28/24 1303 04/29/24 0056 04/30/24 0340 05/01/24 0320 05/02/24 0439  WBC 6.9 5.7 6.6 6.5 6.6  NEUTROABS  --   --   --  4.4  4.5  HGB 13.3 12.6* 12.5* 11.8* 12.1*  HCT 39.9 38.3* 37.7* 35.3* 35.9*  MCV 100.0 100.5* 99.2 98.6 97.8  PLT 160 152 154 140* 161   Basic Metabolic Panel: Recent Labs  Lab 04/28/24 1303 04/29/24 0056 04/30/24 0340 05/01/24 0320 05/02/24 0439  NA 134* 135 135 134* 132*  K 4.8 4.9 5.0 5.0 4.5  CL 102 102 103 101 99  CO2 20* 26 23 22 24   GLUCOSE 120* 146* 112* 111* 104*  BUN 24* 27* 25* 26* 24*  CREATININE 1.36* 1.32* 1.26* 1.31* 1.12  CALCIUM 9.5 9.4 9.1 8.9 9.2  MG  --   --   --   --  1.9    LOS: 5 days   Time spent:  [name], DO [facility]  If 7PM-7AM, please contact night-coverage [email or web address]  05/04/2024, 8:22 AM

## 2024-05-04 NOTE — Plan of Care (Signed)

## 2024-05-05 ENCOUNTER — Inpatient Hospital Stay

## 2024-05-05 DIAGNOSIS — M5441 Lumbago with sciatica, right side: Secondary | ICD-10-CM | POA: Diagnosis not present

## 2024-05-05 DIAGNOSIS — M5442 Lumbago with sciatica, left side: Secondary | ICD-10-CM | POA: Diagnosis not present

## 2024-05-05 DIAGNOSIS — G939 Disorder of brain, unspecified: Secondary | ICD-10-CM | POA: Diagnosis not present

## 2024-05-05 DIAGNOSIS — G9389 Other specified disorders of brain: Secondary | ICD-10-CM | POA: Diagnosis not present

## 2024-05-05 LAB — CSF CULTURE W GRAM STAIN: Culture: NO GROWTH

## 2024-05-05 MED ORDER — GADOBUTROL 1 MMOL/ML IV SOLN
10.0000 mL | Freq: Once | INTRAVENOUS | Status: AC | PRN
  Administered 2024-05-05: 10 mL via INTRAVENOUS

## 2024-05-05 MED ORDER — OXYCODONE-ACETAMINOPHEN 5-325 MG PO TABS
1.0000 | ORAL_TABLET | Freq: Four times a day (QID) | ORAL | Status: DC
  Administered 2024-05-05 – 2024-05-06 (×5): 1 via ORAL
  Filled 2024-05-05 (×5): qty 1

## 2024-05-05 NOTE — Progress Notes (Signed)
 Physical Therapy Treatment Patient Details Name: Roger Black MRN: [id number] DOB: October 27, 1955 Today's Date: 05/05/2024   History of Present Illness Pt is a 68 year old male presents with leg pani; Pt was seen by his PCP who ordered MRI-L [place] on 10/3, which showed multilevel stenosis with enhancement of several nerve roots of the cauda equina without nodular masslike enhancement which radiology notes may be secondary to underlying infectious or inflammatory process. Pt was placed on gabapentin and Norco with minimal relief.  Now patient cannot walk due to severe pain and mild leg weakness    PMH significant HF with EF 45%, HTN, HLD, CAD, former smoker, prior lung cancer, COPD/asthma, GERD, CKD-3, skin cancer, obesity    PT Comments  During pregait/gait training, pt reports feeling LE weakness and fear of falling; pt was stable with RW with small jumpy type /ataxic movmements in trunk and LEs, CGA Level.  Pt demonstrates dynamic standing balance within BOS with intermittent UE support at CGA level.  Continued PT will assist pt towards greater dynamic standing balance, LE strengthening, and activity tolerance to increase safety and independence and decrease burden of care with functional mobility.    If plan is discharge home, recommend the following: A little help with walking and/or transfers   Can travel by private vehicle        Equipment Recommendations  Rolling walker (2 wheels)    Recommendations for Other Services       Precautions / Restrictions Precautions Precautions: Fall Recall of Precautions/Restrictions: Intact Restrictions Weight Bearing Restrictions Per Provider Order: No     Mobility  Bed Mobility Overal bed mobility: Modified Independent             General bed mobility comments: Able tomove from supine>sit without physical assistance. C/o pain with movement    Transfers Overall transfer level: Needs assistance Equipment used: Rolling walker (2  wheels) Transfers: Sit to/from Stand, Bed to chair/wheelchair/BSC Sit to Stand: Contact guard assist   Step pivot transfers: Contact guard assist       General transfer comment: cues for safe hand placement when using RW    Ambulation/Gait Ambulation/Gait assistance: Contact guard assist Gait Distance (Feet): 130 Feet Assistive device: Rolling walker (2 wheels) Gait Pattern/deviations: Decreased stride length Gait velocity: decreased     General Gait Details: Pt reports feeling LE weakness and fear of falling; pt was stable with RW with small jumpy type /ataxic movmements in trunk and LEs.   Stairs             Wheelchair Mobility     Tilt Bed    Modified Rankin (Stroke Patients Only)       Balance Overall balance assessment: Needs assistance Sitting-balance support: Feet supported Sitting balance-Leahy Scale: Good     Standing balance support: No upper extremity supported Standing balance-Leahy Scale: Fair Standing balance comment: dynamic standing balance training within BOS-  alt reaches, upper trunk rotations, head turns/nods, marching in place, side-to-side step, performed with intermittent to no UE support CGA level.                            Communication Communication Communication: No apparent difficulties  Cognition Arousal: Alert Behavior During Therapy: WFL for tasks assessed/performed   PT - Cognitive impairments: No apparent impairments                         Following commands: Intact  Cueing Cueing Techniques: Verbal cues  Exercises      General Comments        Pertinent Vitals/Pain Pain Assessment Pain Assessment: Faces Faces Pain Scale: Hurts little more Pain Descriptors / Indicators: Discomfort Pain Intervention(s): Monitored during session, Limited activity within patient's tolerance, Premedicated before session    Home Living                          Prior Function             PT Goals (current goals can now be found in the care plan section) Acute Rehab PT Goals Patient Stated Goal: to figure out what is going on PT Goal Formulation: With patient Time For Goal Achievement: 05/14/24 Potential to Achieve Goals: Good Progress towards PT goals: Progressing toward goals    Frequency    Min 2X/week      PT Plan      Co-evaluation              AM-PAC PT 6 Clicks Mobility   Outcome Measure  Help needed turning from your back to your side while in a flat bed without using bedrails?: None Help needed moving from lying on your back to sitting on the side of a flat bed without using bedrails?: None Help needed moving to and from a bed to a chair (including a wheelchair)?: None Help needed standing up from a chair using your arms (e.g., wheelchair or bedside chair)?: A Little Help needed to walk in hospital room?: A Little Help needed climbing 3-5 steps with a railing? : A Little 6 Click Score: 21    End of Session Equipment Utilized During Treatment: Gait belt Activity Tolerance: Patient limited by fatigue Patient left: in chair;with call bell/phone within reach;with chair alarm set Nurse Communication: Mobility status PT Visit Diagnosis: Pain Pain - part of body: Hip     Time: 9144-9084 PT Time Calculation (min) (ACUTE ONLY): 20 min  Charges:    $Therapeutic Activity: 8-22 mins PT General Charges $$ ACUTE PT VISIT: 1 Visit                    Harland Irving, PTA  05/05/24, 9:32 AM

## 2024-05-05 NOTE — Plan of Care (Signed)

## 2024-05-05 NOTE — Progress Notes (Signed)
 Occupational Therapy Treatment Patient Details Name: Roger Black MRN: [id number] DOB: January 08, 1956 Today's Date: 05/05/2024   History of present illness Pt is a 68 year old male presents with leg pani; Pt was seen by his PCP who ordered MRI-L [place] on 10/3, which showed multilevel stenosis with enhancement of several nerve roots of the cauda equina without nodular masslike enhancement which radiology notes may be secondary to underlying infectious or inflammatory process. Pt was placed on gabapentin and Norco with minimal relief.  Now patient cannot walk due to severe pain and mild leg weakness    PMH significant HF with EF 45%, HTN, HLD, CAD, former smoker, prior lung cancer, COPD/asthma, GERD, CKD-3, skin cancer, obesity   OT comments  Pt seen for OT treatment on this date. Upon arrival to room pt semi supine in bed, agreeable to tx. Pt completed bed mobility with MODI, operating bed to aid in position changes. Pt participated in LB dressing education via  demonstration and video to ensure carryover of technique. Pt required MINA to complete donning socks with use of sock aid while sitting on the EOB, required MAXA to doff socks. Pt educated on the use of a reacher to aid in undressing his LB when no assist is present. Pt verbalized understanding and reports he owns a reacher to utilizes at discharge. Pt making good progress toward goals, will continue to follow POC. Discharge recommendation remains appropriate.        If plan is discharge home, recommend the following:  A lot of help with bathing/dressing/bathroom;A lot of help with walking and/or transfers   Equipment Recommendations  BSC/3in1;Tub/shower bench    Recommendations for Other Services      Precautions / Restrictions Precautions Precautions: Fall Recall of Precautions/Restrictions: Intact Restrictions Weight Bearing Restrictions Per Provider Order: No       Mobility Bed Mobility Overal bed mobility: Modified  Independent             General bed mobility comments: No physical assistance for supine<>sitting    Transfers                         Balance Overall balance assessment: Needs assistance Sitting-balance support: Feet supported Sitting balance-Leahy Scale: Normal Sitting balance - Comments: No UE support when reaching within BOS                                   ADL either performed or assessed with clinical judgement   ADL Overall ADL's : Needs assistance/impaired Eating/Feeding: Set up;Sitting                   Lower Body Dressing: Minimal assistance;Sitting/lateral leans;With adaptive equipment                 General ADL Comments: LB dressing with use of AD, MINA to complete in sitting    Extremity/Trunk Assessment              Vision       Perception     Praxis     Communication Communication Communication: No apparent difficulties   Cognition Arousal: Alert Behavior During Therapy: WFL for tasks assessed/performed Cognition: No apparent impairments           Executive functioning impairment (select all impairments): Problem solving OT - Cognition Comments: A/Ox4  Following commands: Intact        Cueing   Cueing Techniques: Verbal cues  Exercises Exercises: Other exercises Other Exercises Other Exercises: Edu: Role of OT, differences between OT and PT, discharge planning    Shoulder Instructions       General Comments Pt eager to recieve update on his medical concerns - Message sent to MD    Pertinent Vitals/ Pain       Pain Assessment Pain Assessment: Faces Faces Pain Scale: Hurts a little bit Pain Location: BLEs Pain Descriptors / Indicators: Discomfort Pain Intervention(s): Limited activity within patient's tolerance, Repositioned                                                          Frequency  Min 3X/week        Progress Toward  Goals  OT Goals(current goals can now be found in the care plan section)  Progress towards OT goals: Progressing toward goals  Acute Rehab OT Goals OT Goal Formulation: With patient Time For Goal Achievement: 05/14/24 Potential to Achieve Goals: Good ADL Goals Pt Will Perform Grooming: with modified independence;sitting;standing Pt Will Perform Lower Body Dressing: with modified independence;sitting/lateral leans;sit to/from stand Pt Will Transfer to Toilet: with modified independence;ambulating Pt Will Perform Toileting - Clothing Manipulation and hygiene: with modified independence;sitting/lateral leans;sit to/from stand   AM-PAC OT 6 Clicks Daily Activity     Outcome Measure   Help from another person eating meals?: None Help from another person taking care of personal grooming?: None Help from another person toileting, which includes using toliet, bedpan, or urinal?: None Help from another person bathing (including washing, rinsing, drying)?: None Help from another person to put on and taking off regular upper body clothing?: None Help from another person to put on and taking off regular lower body clothing?: A Little 6 Click Score: 23    End of Session    OT Visit Diagnosis: Muscle weakness (generalized) (M62.81);Other abnormalities of gait and mobility (R26.89)   Activity Tolerance Patient tolerated treatment well   Patient Left in bed;with call bell/phone within reach;with bed alarm set;with family/visitor present   Nurse Communication Other (comment) (Pt request for information)        Time: 8542-8481 OT Time Calculation (min): 21 min  Charges: OT General Charges $OT Visit: 1 Visit OT Treatments $Self Care/Home Management : 8-22 mins  Larraine Colas M.S. OTR/L  05/05/24, 3:40 PM

## 2024-05-05 NOTE — Progress Notes (Signed)
 PROGRESS NOTE Roger Black  FMW:[id number] DOB: Feb 19, 1956 DOA: 04/28/2024 PCP: [name], MD  Brief Narrative:  Roger Black is a 68 y.o. male with medical history significant of dCHF with EF 45%, HTN, HLD, CAD, former smoker, prior lung cancer, COPD/asthma, GERD, CKD-3, skin cancer, obesity, who presents with slowly progressive and worsening leg pain for 1 month. Hospitalist called for admission, neurology, interventional radiology, neurosurgery consulted and intake.  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 Potentially inflammatory vs neoplastic and less likely infectious at this point.  - neurosurgery was consulted on presentation and recommended LP and neurology consult.  - IR completed LP- CSF studies notable for minimally elevated white count, atypical cells but glucose essentially normal, with elevated protein. Meningitis panel negative -discontinue acyclovir, ampicillin, ceftriaxone, vancomycin - neurology following- recommending discontinuation of Abx, follow up on cytology and flow cytometry. Results still pending. If inconclusive, will proceed with another LP. If not needing another one, consider steroids course. - pain is still poorly controlled. Changed from gabapentin to lyrica and increasing dose. Continuing cymbalta - s/p lidocaine infusion 10/10 without significant improvement.  - analgesia PRN - trial of heating pad for additional MSK pain from being essentially bed-bound - continue PT/OT- recommending HH - of note, patient has a PET scan scheduled for 10/29    COPD [facility]): Stable - Continue as needed bronchodilators/guaifenesin - routine f/u scheduled with pulmonology 11/19   HTN - Well-controlled on ARB, spironolactone CAD  HLD- Aspirin,  Lipitor  Chronic systolic CHF (congestive heart failure) (HCC) -not in acute exacerbation -most recent echo August 2025 with EF of 45%   Chronic kidney disease, stage 3a (HCC) -at baseline  Obesity (BMI 30-39.9): Class I  DVT prophylaxis: SCDs Start: 04/28/24 1947 Code Status:   Code Status: Full Code Family Communication: None present  Status is: Inpatient  Dispo: The patient is from: Home              Anticipated d/c is to: To be determined              Anticipated d/c date is: To be determined              Patient currently not medically stable for discharge  Consultants:  Neurology Neurosurgery Interventional radiology  Procedures:  LP 10/7, 10/9  Antimicrobials:  Ceftriaxone, vancomycin  Subjective: Pt reports no changes in his status.   Objective: Vitals:   05/04/24 2034 05/05/24 0308 05/05/24 0341 05/05/24 0500  BP: (!) 83/50 132/78 113/66   Pulse: 89 (!) 116 91   Resp: 19 (!) 22 15   Temp: 98.1 F (36.7 C) 98 F (36.7 C) 97.9 F (36.6 C)   TempSrc: Oral     SpO2: 93% 97% 91%   Weight:    95.9 kg  Height:        Intake/Output Summary (Last 24 hours) at 05/05/2024 0758 Last data filed at 05/04/2024 0900 Gross per 24 hour  Intake 200 ml  Output --  Net 200 ml    Filed Weights   04/30/24 0400 05/02/24 0500 05/05/24 0500  Weight: 95.3 kg 95.3 kg 95.9 kg   Examination: General: alert, resting in bed, No acute distress. HEENT:  Normocephalic atraumatic.  Sclerae nonicteric, noninjected.  Extraocular movements intact bilaterally. CV: peripherally well perfused Lungs:  normal  work of breathing Extremities: Without cyanosis, clubbing, edema, or obvious deformity. Skin:  Warm and dry, no erythema.  Data Reviewed: I have personally reviewed following labs and imaging studies  CBC: Recent Labs  Lab 04/28/24 1303 04/29/24 0056 04/30/24 0340 05/01/24 0320 05/02/24 0439  WBC 6.9 5.7 6.6 6.5 6.6  NEUTROABS  --   --   --  4.4 4.5  HGB 13.3 12.6*  12.5* 11.8* 12.1*  HCT 39.9 38.3* 37.7* 35.3* 35.9*  MCV 100.0 100.5* 99.2 98.6 97.8  PLT 160 152 154 140* 161   Basic Metabolic Panel: Recent Labs  Lab 04/28/24 1303 04/29/24 0056 04/30/24 0340 05/01/24 0320 05/02/24 0439  NA 134* 135 135 134* 132*  K 4.8 4.9 5.0 5.0 4.5  CL 102 102 103 101 99  CO2 20* 26 23 22 24   GLUCOSE 120* 146* 112* 111* 104*  BUN 24* 27* 25* 26* 24*  CREATININE 1.36* 1.32* 1.26* 1.31* 1.12  CALCIUM 9.5 9.4 9.1 8.9 9.2  MG  --   --   --   --  1.9    LOS: 6 days   Time spent:  [name], DO [facility]  If 7PM-7AM, please contact night-coverage [email or web address]  05/05/2024, 7:58 AM

## 2024-05-05 NOTE — Progress Notes (Signed)
 Fall occurred during the night  05/05/24 0308  What Happened  Was fall witnessed? No  Was patient injured? Yes  Patient found on floor  Found by Staff-comment Lars RN and Aldona PEAK)  Stated prior activity bathroom-unassisted  Provider Notification  Provider Name/Title Terry Hurst  Date Provider Notified 05/05/24  Time Provider Notified 0340  Method of Notification Call  Notification Reason Fall  Follow Up  Family notified No - patient refusal  Additional tests No  Progress note created (see row info) Yes  Adult Fall Risk Assessment  Risk Factor Category (scoring not indicated) Fall has occurred during this admission (document High fall risk)  Adult Fall Risk Interventions  Required Bundle Interventions *See Row Information* High fall risk  Additional Interventions Use of appropriate toileting equipment (bedpan, BSC, etc.);Room near nurses station  Screening for Fall Injury Risk (To be completed on HIGH fall risk patients) - Assessing Need for Floor Mats  Risk For Fall Injury- Criteria for Floor Mats Previous fall this admission  Will Implement Floor Mats Yes  Vitals  Temp 98 F (36.7 C)  BP 132/78  MAP (mmHg) 96  BP Location Left Arm  BP Method Automatic  Patient Position (if appropriate) Sitting  Pulse Rate (!) 116  Pulse Rate Source Monitor  Resp (!) 22  Oxygen  Therapy  SpO2 97 %  O2 Device Room Air  Neurological  Neuro (WDL) X  Level of Consciousness Alert  Orientation Level Oriented X4  Neuro Symptoms Anxiety;Forgetful  Musculoskeletal  Musculoskeletal (WDL) X  Generalized Weakness Yes  Integumentary  Integumentary (WDL) WDL

## 2024-05-06 ENCOUNTER — Encounter (HOSPITAL_COMMUNITY): Payer: Self-pay

## 2024-05-06 ENCOUNTER — Inpatient Hospital Stay

## 2024-05-06 ENCOUNTER — Other Ambulatory Visit: Payer: Self-pay | Admitting: *Deleted

## 2024-05-06 ENCOUNTER — Ambulatory Visit: Attending: Radiation Oncology | Admitting: Radiation Oncology

## 2024-05-06 ENCOUNTER — Other Ambulatory Visit: Payer: Self-pay

## 2024-05-06 DIAGNOSIS — M792 Neuralgia and neuritis, unspecified: Secondary | ICD-10-CM | POA: Diagnosis not present

## 2024-05-06 DIAGNOSIS — M79606 Pain in leg, unspecified: Secondary | ICD-10-CM | POA: Diagnosis not present

## 2024-05-06 DIAGNOSIS — D496 Neoplasm of unspecified behavior of brain: Secondary | ICD-10-CM

## 2024-05-06 DIAGNOSIS — Z85118 Personal history of other malignant neoplasm of bronchus and lung: Secondary | ICD-10-CM | POA: Diagnosis not present

## 2024-05-06 DIAGNOSIS — Z049 Encounter for examination and observation for unspecified reason: Secondary | ICD-10-CM

## 2024-05-06 DIAGNOSIS — G9389 Other specified disorders of brain: Secondary | ICD-10-CM

## 2024-05-06 DIAGNOSIS — C7931 Secondary malignant neoplasm of brain: Secondary | ICD-10-CM

## 2024-05-06 LAB — COMP PANEL: LEUKEMIA/LYMPHOMA

## 2024-05-06 MED ORDER — METHOCARBAMOL 500 MG PO TABS: 500.0000 mg | ORAL_TABLET | Freq: Four times a day (QID) | ORAL | Status: DC

## 2024-05-06 MED ORDER — IOHEXOL 9 MG/ML PO SOLN
500.0000 mL | ORAL | Status: AC
  Administered 2024-05-06: 500 mL via ORAL

## 2024-05-06 MED ORDER — DULOXETINE HCL 60 MG PO CPEP: 60.0000 mg | ORAL_CAPSULE | Freq: Every day | ORAL | Status: DC

## 2024-05-06 MED ORDER — DEXAMETHASONE 4 MG PO TABS
4.0000 mg | ORAL_TABLET | Freq: Two times a day (BID) | ORAL | Status: DC
  Filled 2024-05-06: qty 1

## 2024-05-06 MED ORDER — PREGABALIN 150 MG PO CAPS: 150.0000 mg | ORAL_CAPSULE | Freq: Three times a day (TID) | ORAL | Status: DC

## 2024-05-06 MED ORDER — TECHNETIUM TC 99M MEDRONATE IV KIT
20.0000 | PACK | Freq: Once | INTRAVENOUS | Status: AC | PRN
  Administered 2024-05-06: 21.83 via INTRAVENOUS

## 2024-05-06 MED ORDER — ZOLPIDEM TARTRATE 5 MG PO TABS: 5.0000 mg | ORAL_TABLET | Freq: Every evening | ORAL | Status: DC | PRN

## 2024-05-06 MED ORDER — IOHEXOL 300 MG/ML  SOLN
100.0000 mL | Freq: Once | INTRAMUSCULAR | Status: AC | PRN
  Administered 2024-05-06: 100 mL via INTRAVENOUS

## 2024-05-06 MED ORDER — MORPHINE SULFATE (PF) 2 MG/ML IV SOLN: 2.0000 mg | INTRAVENOUS | Status: DC | PRN

## 2024-05-06 MED ORDER — ACETAMINOPHEN 325 MG PO TABS: 650.0000 mg | ORAL_TABLET | Freq: Four times a day (QID) | ORAL | Status: DC | PRN

## 2024-05-06 MED ORDER — OXYCODONE-ACETAMINOPHEN 5-325 MG PO TABS: 1.0000 | ORAL_TABLET | Freq: Four times a day (QID) | ORAL | Status: DC

## 2024-05-06 MED ORDER — ONDANSETRON HCL 4 MG/2ML IJ SOLN: 4.0000 mg | Freq: Three times a day (TID) | INTRAMUSCULAR | Status: DC | PRN

## 2024-05-06 NOTE — Plan of Care (Signed)

## 2024-05-06 NOTE — TOC Progression Note (Signed)
 Transition of Care [facility]) - Progression Note    Patient Details  Name: Roger Black MRN: [id number] Date of Birth: 14-Oct-1955  [facility]) CM/SW Contact  [name], RN Phone Number: 05/06/2024, 2:33 PM  Clinical Narrative:      Medical work up still in progress, CM following for plan of care                   Expected Discharge Plan and Services                                               Social Drivers of Health (SDOH) Interventions SDOH Screenings   Food Insecurity: No Food Insecurity (04/29/2024)  Housing: Unknown (04/30/2024)  Transportation Needs: No Transportation Needs (04/29/2024)  Utilities: Not At Risk (04/29/2024)  Financial Resource Strain: Low Risk  (03/07/2024)   Received from [facility]  Recent Concern: Financial Resource Strain - High Risk (01/29/2024)   Received from [facility]  Physical Activity: Inactive (08/07/2023)   Received from [facility]  Social Connections: Unknown (04/30/2024)  Stress: Stress Concern Present (08/07/2023)   Received from [facility]  Tobacco Use: Medium Risk (04/28/2024)  Health Literacy: Adequate Health Literacy (08/07/2023)   Received from [facility]    Readmission Risk Interventions     No data to display

## 2024-05-06 NOTE — Consult Note (Signed)
 NEW PATIENT EVALUATION  Name: Roger Black  MRN: [id number]  Date:   04/28/2024     DOB: 1955-08-04   This 68 y.o. male patient presents to the clinic for initial evaluation of 2 brain metastasis and patient now at 10 months having completed SBRT to his left lung for a stage I non-small cell lung cancer.  REFERRING PHYSICIAN: No ref. provider found  CHIEF COMPLAINT:  Chief Complaint  Patient presents with   Leg Pain    DIAGNOSIS: The primary encounter diagnosis was Midline low back pain with bilateral sciatica, unspecified chronicity. A diagnosis of Lung cancer metastatic to brain [facility]) was also pertinent to this visit.   PREVIOUS INVESTIGATIONS:  MRI scan reviewed thin cut MRI scan ordered Clinical notes reviewed Pathology report reviewed  HPI: Patient is a 68 year old male now about 10 months having completed SBRT to his left lung for stage I non-small cell lung cancer.  He was recently admitted for increasing pain and loss of strength in his lower extremities.  MRI of his lumbar spine showed lumbar spondylosis with broadband disc bulging at L2-3 L3-4 and L4-5.  MRI of his thoracic spine showed abnormal linear enhancement along the surface of the thoracic spinal cord raising concerns for leptomeningeal metastatic disease.  MRI of his brain showed 2 enhancing lesions 1 in the right frontal lobe measuring 1.4 cm greatest dimension in the left cerebellar lesion measuring 1.7 cm in greatest dimension.  There was no abnormal leptomeningeal enhancement.  CT scans have been ordered as well as bone scan.  I have been asked to evaluate the patient for possible radiation therapy to his brain.  At present time he complains of leg spasms and significant loss of motor strength in his lower extremities.  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    PAST SURGICAL HISTORY:  Past Surgical History:  Procedure Laterality Date   COLON SURGERY     COLONOSCOPY WITH PROPOFOL  N/A 10/20/2020   Procedure: COLONOSCOPY WITH PROPOFOL ;  Surgeon: Toledo, Ladell POUR, MD;  Location: [facility] ENDOSCOPY;  Service: Gastroenterology;  Laterality: N/A;    FAMILY HISTORY: family history includes Hypertension in his father and mother.  SOCIAL HISTORY:  reports that he has quit smoking. His smoking use included cigarettes. He has a 47 pack-year smoking history. He has never used smokeless tobacco. He reports that he does not drink alcohol  and does not use drugs.  ALLERGIES: Patient has no known allergies.  MEDICATIONS:  Current Facility-Administered Medications  Medication Dose Route Frequency Provider Last Rate Last Admin   acetaminophen (TYLENOL) tablet 650 mg  650 mg Oral Q6H PRN Niu, Xilin, MD   650 mg at 05/06/24 1103   albuterol (PROVENTIL) (2.5 MG/3ML) 0.083% nebulizer solution 2.5 mg  2.5 mg Nebulization Q4H PRN Niu, Xilin, MD       aspirin EC tablet 81 mg  81 mg Oral Daily Niu, Xilin, MD   81 mg at 05/06/24 0828   atorvastatin (LIPITOR) tablet 80 mg  80 mg Oral Daily Niu, Xilin, MD   80 mg at 05/06/24 0827   dextromethorphan-guaiFENesin (  MUCINEX DM) 30-600 MG per 12 hr tablet 1 tablet  1 tablet Oral BID PRN Niu, Xilin, MD       doxazosin (CARDURA) tablet 2 mg  2 mg Oral QHS Niu, Xilin, MD   2 mg at 05/05/24 2220   DULoxetine (CYMBALTA) DR capsule 60 mg  60 mg Oral Daily Kirkpatrick, McNeill P, MD   60 mg at 05/06/24 0827   fluticasone furoate-vilanterol (BREO ELLIPTA) 200-25  MCG/ACT 1 puff  1 puff Inhalation Daily Niu, Xilin, MD   1 puff at 05/06/24 0828   irbesartan (AVAPRO) tablet 300 mg  300 mg Oral Daily Niu, Xilin, MD   300 mg at 05/06/24 0828   lidocaine (LIDODERM) 5 % 1 patch  1 patch Transdermal Q24H Niu, Xilin, MD   1 patch at 04/29/24 2045   methocarbamol (ROBAXIN) tablet 500 mg  500 mg Oral QID Anderson, Chelsey L, MD   500 mg at 05/06/24 0828   montelukast (SINGULAIR) tablet 10 mg  10 mg Oral Daily Niu, Xilin, MD   10 mg at 05/06/24 9171   morphine (PF) 2 MG/ML injection 2 mg  2 mg Intravenous Q4H PRN Anderson, Chelsey L, MD   2 mg at 05/05/24 2225   ondansetron (ZOFRAN) injection 4 mg  4 mg Intravenous Q8H PRN Niu, Xilin, MD       oxyCODONE-acetaminophen (PERCOCET/ROXICET) 5-325 MG per tablet 1 tablet  1 tablet Oral Q6H [name], MD   1 tablet at 05/06/24 0828   pantoprazole (PROTONIX) EC tablet 40 mg  40 mg Oral Daily Niu, Xilin, MD   40 mg at 05/06/24 0828   polyethylene glycol (MIRALAX / GLYCOLAX) packet 17 g  17 g Oral BID [name], MD   17 g at 05/04/24 2242   pregabalin (LYRICA) capsule 150 mg  150 mg Oral TID Michaela Aisha SQUIBB, MD   150 mg at 05/06/24 0827   zolpidem (AMBIEN) tablet 5 mg  5 mg Oral QHS PRN Niu, Xilin, MD   5 mg at 05/03/24 2055    ECOG PERFORMANCE STATUS:   REVIEW OF SYSTEMS: Patient denies any weight loss, fatigue, weakness, fever, chills or night sweats. Patient denies any loss of vision, blurred vision. Patient denies any ringing  of the ears or hearing loss. No irregular heartbeat. Patient denies heart murmur or history of fainting. Patient denies any chest pain or pain radiating to her upper extremities. Patient denies any shortness of breath, difficulty breathing at night, cough or hemoptysis. Patient denies any swelling in the lower legs. Patient denies any nausea vomiting, vomiting of blood, or coffee ground material in the vomitus. Patient denies any stomach pain. Patient states has had normal  bowel movements no significant constipation or diarrhea. Patient denies any dysuria, hematuria or significant nocturia. Patient denies any problems walking, swelling in the joints or loss of balance. Patient denies any skin changes, loss of hair or loss of weight. Patient denies any excessive worrying or anxiety or significant depression. Patient denies any problems with insomnia. Patient denies excessive thirst, polyuria, polydipsia. Patient denies any swollen glands, patient denies easy bruising or easy bleeding. Patient denies any recent infections, allergies or URI. Patient s visual fields have not changed significantly in recent time.   PHYSICAL EXAM: BP (!) 159/96 (BP Location: Left Arm)   Pulse 88   Temp 98.3 F (36.8 C)   Resp 18   Ht 5' 9 (1.753 m)   Wt 211 lb 6.7 oz (95.9 kg)  SpO2 96%   BMI 31.22 kg/m  Patient has decree strength bilaterally lower extremities.  Proprioception seems intact.  Well-developed well-nourished patient in NAD. HEENT reveals PERLA, EOMI, discs not visualized.  Oral cavity is clear. No oral mucosal lesions are identified. Neck is clear without evidence of cervical or supraclavicular adenopathy. Lungs are clear to A&P. Cardiac examination is essentially unremarkable with regular rate and rhythm without murmur rub or thrill. Abdomen is benign with no organomegaly or masses noted. Motor sensory and DTR levels are equal and symmetric in the upper and lower extremities. Cranial nerves II through XII are grossly intact. Proprioception is intact. No peripheral adenopathy or edema is identified. No motor or sensory levels are noted. Crude visual fields are within normal range.  LABORATORY DATA: Labs reviewed    RADIOLOGY RESULTS: CT scans MRI scans reviewed compatible with above-stated findings   IMPRESSION: Metastatic disease to the brain and 2 separate lesions in patient with known at least stage I lung cancer with workup in progress to determine possible other  causes of his disseminated disease.  PLAN: At this time I have ordered a thin cut MRI scan.  I would plan on delivering SRS to both lesions.  We may try to do both lesions at the same time.  Risks and benefits of treatment low side effect profile for SRS was explained to the patient.  I have set him up for simulation in about a week's time should have MRI and follow-up imaging studies performed by then.  Patient is currently on Decadron.  Will coordinate with his other medical oncology team and would offer palliative radiation therapy to the other areas be identified that would benefit.  Patient comprehensive her recommendations well.  I would like to take this opportunity to thank you for allowing me to participate in the care of your patient.SABRA [name], MD

## 2024-05-06 NOTE — Progress Notes (Signed)
 PROGRESS NOTE Roger Black  FMW:[id number] DOB: 1956/05/05 DOA: 04/28/2024 PCP: [name], MD  Brief Narrative:  Roger Black is a 68 y.o. male with medical history significant of dCHF with EF 45%, HTN, HLD, CAD, former smoker, prior lung cancer, COPD/asthma, GERD, CKD-3, skin cancer, obesity, who presents with slowly progressive and worsening leg pain for 1 month. Hospitalist called for admission, neurology, interventional radiology, neurosurgery consulted and intake.  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 Initial concern for infection started on IV Abx. Those have been discontinued after LP results. Imaging most consistent with malignancy.  - analgesia scheduled and PRN - pain is still poorly controlled. Changed from gabapentin to lyrica and increasing dose. Continuing cymbalta - s/p lidocaine infusion 10/10 without significant improvement.  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 Still awaiting LP cytology for confirmation. Currently discussions of possible open brain biopsy for tissue diagnosis. Neurosurgery has been consulted.  Brain MRI: Circumscribed enhancing lesions in the right frontal lobe (14 x 13 x 13 mm) and left cerebellar hemisphere (17 x 15 x 14 mm), concerning for metastatic disease, with associated vasogenic edema but no significant mass effect. No abnormal leptomeningeal enhancement. - oncology following. Dr [name], Dr Melanee. - f/u neurosurgery for possible brain biopsy. Would need transfer to [facility] and possible ability to do on Thursday. Still not decided.   - no evidence of osseous metastatic  disease on bone scan. No metastatic disease on chest, abdomen, or pelvis on CT - of note, patient has a PET scan scheduled for 10/29    COPD [facility]): Stable - Continue as needed bronchodilators/guaifenesin - routine f/u scheduled with pulmonology 11/19   HTN - Well-controlled on ARB, spironolactone CAD  HLD- Aspirin, Lipitor  Chronic systolic CHF (congestive heart failure) (HCC) -not in acute exacerbation -most recent echo August 2025 with EF of 45%   Chronic kidney disease, stage 3a (HCC) -at baseline  Obesity (BMI 30-39.9): Class I  DVT prophylaxis: SCDs Start: 04/28/24 1947 Code Status:   Code Status: Full Code Family Communication: None present  Status is: Inpatient  Dispo: The patient is from: Home              Anticipated d/c is to: To be determined              Anticipated d/c date is: To be determined              Patient currently not medically stable for discharge  Consultants:  Neurology Neurosurgery Interventional radiology Oncology   Procedures:  LP 10/7, 10/9  Subjective: Pt reports no changes in his status. He is overwhelmed with the news and frustrated on not having a final diagnosis.  Objective: Vitals:   05/05/24 0500 05/05/24 1459 05/05/24 1923 05/06/24 0356  BP:  (!) 103/56 128/73 132/75  Pulse:  87 93 86  Resp:  16 16 16   Temp:  98.1 F (36.7 C) 97.8 F (36.6 C) 97.8 F (36.6 C)  TempSrc:  Oral Oral   SpO2:  93% 96% 97%  Weight: 95.9 kg     Height:       No intake or output data in the 24 hours ending 05/06/24 9161  Filed Weights   04/30/24 0400 05/02/24 0500 05/05/24 0500  Weight: 95.3 kg 95.3 kg 95.9 kg   Examination: General: alert, resting in bed, No acute distress. HEENT:  Normocephalic atraumatic.  Sclerae nonicteric, noninjected.  Extraocular movements intact bilaterally. CV: peripherally well perfused Lungs:  normal work of breathing Extremities: Without cyanosis, clubbing, edema, or obvious deformity. Skin:  Warm and dry,  no erythema.  Data Reviewed: I have personally reviewed following labs and imaging studies  CBC: Recent Labs  Lab 04/30/24 0340 05/01/24 0320 05/02/24 0439  WBC 6.6 6.5 6.6  NEUTROABS  --  4.4 4.5  HGB 12.5* 11.8* 12.1*  HCT 37.7* 35.3* 35.9*  MCV 99.2 98.6 97.8  PLT 154 140* 161   Basic Metabolic Panel: Recent Labs  Lab 04/30/24 0340 05/01/24 0320 05/02/24 0439  NA 135 134* 132*  K 5.0 5.0 4.5  CL 103 101 99  CO2 23 22 24   GLUCOSE 112* 111* 104*  BUN 25* 26* 24*  CREATININE 1.26* 1.31* 1.12  CALCIUM 9.1 8.9 9.2  MG  --   --  1.9    LOS: 7 days   Time spent:  [name], DO [facility]  If 7PM-7AM, please contact night-coverage [email or web address]  05/06/2024, 8:38 AM

## 2024-05-06 NOTE — Consult Note (Signed)
 Hematology/Oncology Consult note [facility] Telephone:([phone] [phone]  Patient Care Team: [name], MD as PCP - General (Internal Medicine)   Name of the patient: Roger Black  [id number]  [birth year]    Reason for referral-brain lesions   Referring physician-Dr. Lenon  Date of visit: 05/06/2024    History of presenting illness- Patient is a 68 year old male with a past medical history significant for hypertension hyperlipidemia CAD, stage III CKD who was treated for presumed stage I lung cancer by Dr. Camelia with SBRT in December 2025.  He presented to the ER with symptoms of worsening leg pain that has been going on for a month.  He Had MRI cervical thoracic and lumbar spine.  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  Infectious or inflammatory etiologies also in the differential.  Patient had lumbar puncture done with CSF analysis and cytology showed atypical cells with predominant lymphocytes but was not conclusive for malignancy.  Flow cytometry on the CSF did not show any evidence of monoclonal B-cell or T-cell lymphocyte population. Patient also had MRI brain with and without contrast which shows circumscribed enhancing lesions in the right frontal lobe and left cerebellar hemisphere concerning for metastatic disease with associated vasogenic edema but no significant mass effect.  No abnormal leptomeningeal enhancement.  CT chest abdomen and pelvis with contrast and bone scan did not show any evidence of primary malignancy.  Patient presently has been seen by neurology as well as radiation oncology but we do not have any tissue diagnosis yet.  Patient reports worsening bilateral lower extremity pain and weakness over the last 6 to 7 weeks.  He has also noticed some tremors in his bilateral hands  ECOG PS- 2  Pain  scale- 0   Review of systems- Review of Systems  Constitutional:  Negative for chills, fever, malaise/fatigue and weight loss.  HENT:  Negative for congestion, ear discharge and nosebleeds.   Eyes:  Negative for blurred vision.  Respiratory:  Negative for cough, hemoptysis, sputum production, shortness of breath and wheezing.   Cardiovascular:  Negative for chest pain, palpitations, orthopnea and claudication.  Gastrointestinal:  Negative for abdominal pain, blood in stool, constipation, diarrhea, heartburn, melena, nausea and vomiting.  Genitourinary:  Negative for dysuria, flank pain, frequency, hematuria and urgency.  Musculoskeletal:  Negative for back pain, joint pain and myalgias.  Skin:  Negative for rash.  Neurological:  Negative for dizziness, tingling, focal weakness, seizures, weakness and headaches.  Endo/Heme/Allergies:  Does not bruise/bleed easily.  Psychiatric/Behavioral:  Negative for depression and suicidal ideas. The patient does not have insomnia.     No Known Allergies  Patient Active Problem List   Diagnosis Date Noted   Abnormal findings on diagnostic imaging of spine 04/29/2024   Back pain 04/29/2024   Leg pain 04/28/2024   Obesity (BMI 30-39.9) 04/28/2024   HTN (hypertension) 04/28/2024   Hyperlipidemia    Chronic systolic CHF (congestive heart failure) (HCC)    Coronary artery disease    COPD (chronic obstructive pulmonary disease) (HCC)    Chronic kidney disease, stage 3a (HCC)    Personal history of tobacco use, presenting hazards to health 09/21/2015     Past Medical History:  Diagnosis Date   Actinic keratosis    Aortic atherosclerosis    Arthritis    Asthma    Chronic kidney disease, stage 3a (HCC)    Chronic systolic  CHF (congestive heart failure) (HCC)    COPD (chronic obstructive pulmonary disease) (HCC)    Coronary artery disease    HTN (hypertension)    Hyperlipidemia    Personal history of tobacco use, presenting hazards to health  09/21/2015   Sleep apnea    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   right lateral hand, treating with 5FU/calcipotriene   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   vertex scalp, treating with 5FU/calcipotriene     Past Surgical History:  Procedure Laterality Date   COLON SURGERY     COLONOSCOPY WITH PROPOFOL  N/A 10/20/2020   Procedure: COLONOSCOPY WITH PROPOFOL ;  Surgeon: Toledo, Ladell POUR, MD;  Location: [facility] ENDOSCOPY;  Service: Gastroenterology;  Laterality: N/A;    Social History   Socioeconomic History   Marital status: Married    Spouse name: Not on file   Number of children: Not on file   Years of education: Not on file   Highest education level: Not on file  Occupational History   Not on file  Tobacco Use   Smoking status: Former    Current packs/day: 1.00    Average packs/day: 1 pack/day for 47.0 years (47.0 ttl pk-yrs)    Types: Cigarettes   Smokeless tobacco: Never  Vaping Use   Vaping status: Never Used  Substance and Sexual Activity   Alcohol  use: Never   Drug use: Never   Sexual activity: Not on file  Other Topics Concern   Not on file  Social History Narrative   Not on file   Social Drivers of Health   Financial Resource Strain: Low Risk  (03/07/2024)   Received from [facility]   Overall Financial Resource Strain (CARDIA)    Difficulty of Paying Living Expenses: Not hard at all  Recent Concern: Financial Resource Strain - High Risk (01/29/2024)   Received from [facility]   Overall Financial Resource Strain (CARDIA)    Difficulty of Paying Living Expenses: Hard  Food Insecurity: No Food Insecurity (04/29/2024)   Hunger Vital Sign    Worried About Running Out of Food in the Last Year: Never true    Ran Out of Food in the Last Year: Never true  Transportation Needs: No Transportation Needs (04/29/2024)   PRAPARE - Administrator, Civil Service (Medical): No    Lack of  Transportation (Non-Medical): No  Physical Activity: Inactive (08/07/2023)   Received from [facility]   Exercise Vital Sign    On average, how many days per week do you engage in moderate to strenuous exercise (like a brisk walk)?: 0 days    On average, how many minutes do you engage in exercise at this level?: 0 min  Stress: Stress Concern Present (08/07/2023)   Received from [facility] - Occupational Stress Questionnaire    Feeling of Stress : Rather much  Social Connections: Unknown (04/30/2024)   Social Connection and Isolation Panel    Frequency of Communication with Friends and Family: Three times a week    Frequency of Social Gatherings with Friends and Family: Three times a week    Attends Religious Services: Patient declined    Active Member of Clubs or Organizations: Patient declined    Attends Banker Meetings: Not on file    Marital Status: Not on file  Intimate Partner Violence: Not At Risk (04/29/2024)   Humiliation, Afraid, Rape, and Kick  questionnaire    Fear of Current or Ex-Partner: No    Emotionally Abused: No    Physically Abused: No    Sexually Abused: No     Family History  Problem Relation Age of Onset   Hypertension Mother    Hypertension Father      Current Facility-Administered Medications:    acetaminophen (TYLENOL) tablet 650 mg, 650 mg, Oral, Q6H PRN, Niu, Xilin, MD, 650 mg at 05/06/24 1103   albuterol (PROVENTIL) (2.5 MG/3ML) 0.083% nebulizer solution 2.5 mg, 2.5 mg, Nebulization, Q4H PRN, Niu, Xilin, MD   aspirin EC tablet 81 mg, 81 mg, Oral, Daily, Niu, Xilin, MD, 81 mg at 05/06/24 9171   atorvastatin (LIPITOR) tablet 80 mg, 80 mg, Oral, Daily, Niu, Xilin, MD, 80 mg at 05/06/24 0827   dextromethorphan-guaiFENesin (MUCINEX DM) 30-600 MG per 12 hr tablet 1 tablet, 1 tablet, Oral, BID PRN, Niu, Xilin, MD   doxazosin (CARDURA) tablet 2 mg, 2 mg, Oral, QHS, Niu,  Xilin, MD, 2 mg at 05/05/24 2220   DULoxetine (CYMBALTA) DR capsule 60 mg, 60 mg, Oral, Daily, Michaela Aisha SQUIBB, MD, 60 mg at 05/06/24 0827   fluticasone furoate-vilanterol (BREO ELLIPTA) 200-25 MCG/ACT 1 puff, 1 puff, Inhalation, Daily, Niu, Xilin, MD, 1 puff at 05/06/24 0828   irbesartan (AVAPRO) tablet 300 mg, 300 mg, Oral, Daily, Niu, Xilin, MD, 300 mg at 05/06/24 0828   lidocaine (LIDODERM) 5 % 1 patch, 1 patch, Transdermal, Q24H, Niu, Xilin, MD, 1 patch at 04/29/24 2045   methocarbamol (ROBAXIN) tablet 500 mg, 500 mg, Oral, QID, Lenon Pons L, MD, 500 mg at 05/06/24 1429   montelukast (SINGULAIR) tablet 10 mg, 10 mg, Oral, Daily, Niu, Xilin, MD, 10 mg at 05/06/24 9171   morphine (PF) 2 MG/ML injection 2 mg, 2 mg, Intravenous, Q4H PRN, Lenon Pons CROME, MD, 2 mg at 05/05/24 2225   ondansetron (ZOFRAN) injection 4 mg, 4 mg, Intravenous, Q8H PRN, Niu, Xilin, MD   oxyCODONE-acetaminophen (PERCOCET/ROXICET) 5-325 MG per tablet 1 tablet, 1 tablet, Oral, Q6H, Lenon Pons CROME, MD, 1 tablet at 05/06/24 1429   pantoprazole (PROTONIX) EC tablet 40 mg, 40 mg, Oral, Daily, Niu, Xilin, MD, 40 mg at 05/06/24 0828   polyethylene glycol (MIRALAX / GLYCOLAX) packet 17 g, 17 g, Oral, BID, Lenon Pons CROME, MD, 17 g at 05/04/24 2242   pregabalin (LYRICA) capsule 150 mg, 150 mg, Oral, TID, Michaela Aisha SQUIBB, MD, 150 mg at 05/06/24 1428   zolpidem (AMBIEN) tablet 5 mg, 5 mg, Oral, QHS PRN, Niu, Xilin, MD, 5 mg at 05/03/24 2055   Physical exam:  Vitals:   05/05/24 1459 05/05/24 1923 05/06/24 0356 05/06/24 0842  BP: (!) 103/56 128/73 132/75 (!) 159/96  Pulse: 87 93 86 88  Resp: 16 16 16 18   Temp: 98.1 F (36.7 C) 97.8 F (36.6 C) 97.8 F (36.6 C) 98.3 F (36.8 C)  TempSrc: Oral Oral    SpO2: 93% 96% 97% 96%  Weight:      Height:       Physical Exam Cardiovascular:     Rate and Rhythm: Normal rate and regular rhythm.     Heart sounds: Normal heart sounds.  Pulmonary:      Effort: Pulmonary effort is normal.     Breath sounds: Normal breath sounds.  Abdominal:     General: Bowel sounds are normal.     Palpations: Abdomen is soft.  Skin:    General: Skin is warm and dry.  Neurological:  Mental Status: He is alert and oriented to person, place, and time.     Comments: Power 4/5 b/lLE           Latest Ref Rng & Units 05/02/2024    4:39 AM  CMP  Glucose 70 - 99 mg/dL 895   BUN 8 - 23 mg/dL 24   Creatinine 9.38 - 1.24 mg/dL 8.87   Sodium 864 - 854 mmol/L 132   Potassium 3.5 - 5.1 mmol/L 4.5   Chloride 98 - 111 mmol/L 99   CO2 22 - 32 mmol/L 24   Calcium 8.9 - 10.3 mg/dL 9.2   Total Protein 6.5 - 8.1 g/dL 6.6   Total Bilirubin 0.0 - 1.2 mg/dL 0.8   Alkaline Phos 38 - 126 U/L 59   AST 15 - 41 U/L 19   ALT 0 - 44 U/L 16       Latest Ref Rng & Units 05/02/2024    4:39 AM  CBC  WBC 4.0 - 10.5 K/uL 6.6   Hemoglobin 13.0 - 17.0 g/dL 87.8   Hematocrit 60.9 - 52.0 % 35.9   Platelets 150 - 400 K/uL 161     @IMAGES @  CT CHEST ABDOMEN PELVIS W CONTRAST Result Date: 05/06/2024 CLINICAL DATA:  Non-small cell lung cancer, staging. * Tracking Code: Roger * EXAM: CT CHEST, ABDOMEN, AND PELVIS WITH CONTRAST TECHNIQUE: Multidetector CT imaging of the chest, abdomen and pelvis was performed following the standard protocol during bolus administration of intravenous contrast. RADIATION DOSE REDUCTION: This exam was performed according to the departmental dose-optimization program which includes automated exposure control, adjustment of the mA and/or kV according to patient size and/or use of iterative reconstruction technique. CONTRAST:  OMNIPAQUE  IOHEXOL  300 MG/ML  SOLN COMPARISON:  Chest CT 11/05/2023 and PET-CT 04/30/2023. Whole-body bone scan 05/06/2024. FINDINGS: CT CHEST FINDINGS Cardiovascular: No acute vascular findings. Atherosclerosis of the aorta, great vessels and coronary arteries. Stable mild dilatation of the distal thoracic arch. The heart  size is normal. There is no pericardial effusion. Mediastinum/Nodes: There are no enlarged mediastinal, hilar or axillary lymph nodes. The thyroid  gland, trachea and esophagus demonstrate no significant findings. Lungs/Pleura: No pleural effusion or pneumothorax. Moderate centrilobular and paraseptal emphysema again noted. Previously demonstrated nodule medially at the left lung apex appears slightly smaller, measuring 8 x 5 mm on image 31/4 (previously 10 x 8 mm). This is partly obscured by adjacent pleural thickening. No confluent airspace disease or enlarging nodules identified. There is mild atelectasis along the superior aspect of the right major fissure. Musculoskeletal/Chest wall: No chest wall mass or suspicious osseous findings. Stable chronic mild anterior wedging at T12 with chronic interspinous widening at T11-12, suggesting remote injury. CT ABDOMEN AND PELVIS FINDINGS Hepatobiliary: The liver has a non cirrhotic morphology and demonstrates no suspicious focal abnormalities. Probable cyst medially in the right lobe measures 1.7 cm on image 58/2, unchanged. There are additional smaller lesions which are not well seen previously due to lack of contrast on prior studies, but are likely cysts. No evidence of gallstones, gallbladder wall thickening or biliary dilatation. Pancreas: Unremarkable. No pancreatic ductal dilatation or surrounding inflammatory changes. Spleen: Normal in size without focal abnormality. Adrenals/Urinary Tract: The adrenal glands appear stable with unchanged mild thickening of the left gland, without hypermetabolic activity on PET-CT. Small nonobstructing calculus in the interpolar region of the right kidney. No evidence of hydronephrosis or ureteral calculus. Stable small right renal cysts for which no specific follow-up imaging is recommended. The bladder appears  normal for its degree of distention. Stomach/Bowel: Enteric contrast has passed into the distal small bowel. Nonspecific  diffuse gastric wall thickening, similar to prior studies. No evidence of bowel distension, wall thickening or surrounding inflammation. The appendix appears normal. Mildly prominent stool throughout the colon. Postsurgical changes in the proximal descending colon consistent with partial colon resection and anastomosis. Vascular/Lymphatic: There are no enlarged abdominal or pelvic lymph nodes. No acute vascular findings. Aortic and branch vessel atherosclerosis without evidence of aneurysm or large vessel occlusion. 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 Reproductive: The prostate gland and seminal vesicles appear unremarkable. Other: Small fat containing left inguinal hernia. No ascites, peritoneal nodularity or pneumoperitoneum. Musculoskeletal: No acute or significant osseous findings. Mild spondylosis. IMPRESSION: 1. Slight decrease in size of left apical pulmonary nodule consistent with response to therapy. 2. No evidence of metastatic disease in the chest, abdomen or pelvis. 3. Stable nonspecific thickening of the left adrenal gland without hypermetabolic activity on PET-CT. 4. Nonspecific diffuse gastric wall thickening, similar to prior studies. 5. Aortic Atherosclerosis (ICD10-I70.0) and Emphysema (ICD10-J43.9). Electronically Signed   By: Elsie Perone M.D.   On: 05/06/2024 15:58   NM Bone Scan Whole Body Result Date: 05/06/2024 CLINICAL DATA:  Metastatic disease evaluation. History of lung cancer. Recent falls. EXAM: NUCLEAR MEDICINE WHOLE BODY BONE SCAN TECHNIQUE: Whole body anterior and posterior images were obtained approximately 3 hours after intravenous injection of radiopharmaceutical. RADIOPHARMACEUTICALS:  21.83 mCi Technetium-34m MDP IV COMPARISON:  PET-CT 04/30/2023. CT of the chest, abdomen and pelvis 05/06/2024. FINDINGS: There is no osseous uptake suspicious for metastatic disease. Scattered mild degenerative uptake is demonstrated in the shoulders, wrists, knees and feet.  There is mild degenerative uptake in the spine. Soft tissue activity is within physiologic limits. IMPRESSION: No evidence of osseous metastatic disease. Electronically Signed   By: Elsie Perone M.D.   On: 05/06/2024 15:43   MR BRAIN W WO CONTRAST Result Date: 05/06/2024 EXAM: MRI BRAIN WITH AND WITHOUT CONTRAST 05/05/2024 09:15:52 PM TECHNIQUE: Multiplanar multisequence MRI of the head/brain was performed with and without the administration of 10 mL gadobutrol (GADAVIST) 1 MMOL/ML injection. COMPARISON: None available. CLINICAL HISTORY: Abnormal lumbar and thoracic leptomeningeal enhancement. Known cancer patient. FINDINGS: BRAIN AND VENTRICLES: No acute infarct. No acute intracranial hemorrhage. No midline shift. No hydrocephalus. The sella is unremarkable. Normal flow voids. There are circumscribed enhancing lesions present within the right frontal lobe and left cerebellar hemisphere, which are concerning for metastatic disease. The lesion within the right frontal lobe is seen on image 91 of series 20 and measures approximately 14 x 13 x 13 mm. The lesion within the left cerebellar hemisphere is seen on image 54, measuring approximately 17 x 15 x 14 mm. There is moderate vasogenic edema associated with the left cerebellar lesion but there is no significant mass effect. There is also vasogenic edema associated with the lesion in the right frontal lobe, also without significant mass effect. There is a focal area of gliosis in the periventricular and deep white matter of the left frontal lobe which is nonenhancing. There are also scattered foci of increased T2 signal throughout the cerebral white matter bilaterally. There is no abnormal leptomeningeal enhancement demonstrated. ORBITS: No acute abnormality. SINUSES: No acute abnormality. BONES AND SOFT TISSUES: Normal bone marrow signal and enhancement. No acute soft tissue abnormality. IMPRESSION: 1. Circumscribed enhancing lesions in the right frontal lobe  (14 x 13 x 13 mm) and left cerebellar hemisphere (17 x 15 x 14 mm), concerning for  metastatic disease, with associated vasogenic edema but no significant mass effect. 2. No abnormal leptomeningeal enhancement. Electronically signed by: Evalene Coho MD 05/06/2024 04:34 AM EDT RP Workstation: [id number]   MR THORACIC SPINE W WO CONTRAST Result Date: 05/02/2024 EXAM: MRI THORACIC SPINE WITHOUT AND WITH INTRAVENOUS CONTRAST 05/02/2024 12:43:50 PM TECHNIQUE: Multiplanar multisequence MRI of the thoracic spine was performed without and with the administration of 10 mL of gadobutrol (GADAVIST) 1 MMOL/ML injection. COMPARISON: CT chest 11/05/2023. CLINICAL HISTORY: CSF leak/Spontaneous intracranial hypotension suspected. Leg weakness with fall; patient states muscle spasms. Abnormal enhancement of cauda equina nerve roots on recent lumbar spine MRI. FINDINGS: BONES AND ALIGNMENT: Straightening of the thoracic spine. Grade 1 anterolisthesis of C7 on T1. Trace anterolisthesis of T10 on T11. Focal kyphosis at T11-T12. Mild chronic anterior wedging of the T12 vertebral body. No acute fracture, suspicious marrow lesion, or evidence of discitis. SPINAL CORD: Prominent linear enhancement along the surface of the thoracic spinal cord. This is greatest in the lower thoracic spine and at least partly vascular but is more prominent than is typically seen, and there are also areas of discontinuous enhancement along the dorsal surface of the cord at T1 (better demonstrated on the contemporaneous cervical spine MRI), T3-T4, and T4-T5 which are not clearly vascular. No evidence of cord edema or abnormal intramedullary enhancement. SOFT TISSUES: Emphysema. Small cyst in the right hepatic lobe. DEGENERATIVE CHANGES: Mild diffuse thoracic disc degeneration, greatest in the lower thoracic spine. Facet arthrosis which is asymmetrically advanced on the right in the upper thoracic spine and lower thoracic spine and on the left in the mid  thoracic spine. Disc bulging at T11-T12 results in slight ventral cord flattening without significant stenosis. Widely patent spinal canal elsewhere. No compressive neural foraminal stenosis. IMPRESSION: 1. Abnormal linear enhancement along the surface of the thoracic spinal cord. In combination with the recent lumbar spine MRI findings, this raises concern for leptomeningeal metastatic disease or an inflammatory or infectious process. CSF analysis is pending. 2. Thoracic disc and facet degeneration without high-grade stenosis. Electronically signed by: Dasie Hamburg MD 05/02/2024 01:46 PM EDT RP Workstation: [id number]   MR CERVICAL SPINE W WO CONTRAST Result Date: 05/02/2024 EXAM: MRI CERVICAL SPINE WITH AND WITHOUT CONTRAST 05/02/2024 12:43:50 PM TECHNIQUE: Multiplanar multisequence MRI of the cervical spine was performed without and with the administration of 10 mL gadobutrol (GADAVIST) 1 MMOL/ML injection. COMPARISON: MRI cervical spine 02/22/2022. CLINICAL HISTORY: CSF leak suspected/ Spontaneous intracranial hypotension. Leg weakness with fall; patient states muscle spasms. FINDINGS: BONES AND ALIGNMENT: Straightening of the normal cervical lordosis. No fracture, suspicious marrow lesion, or evidence of discitis. Moderate disc space narrowing from C3-C4 through C7-T1 with associated predominantly chronic degenerative endplate changes. SPINAL CORD: Normal spinal cord signal. No definite abnormal intradural enhancement in the cervical spine within limitations of artifact. SOFT TISSUES: No paraspinal mass. C2-C3: Negative. C3-C4: Mild disc bulging, a left foraminal disc protrusion, and uncovertebral spurring result in severe left neural foraminal stenosis without spinal stenosis, unchanged. C4-C5: Disc bulging and uncovertebral spurring result in mild spinal stenosis and moderate to severe bilateral neural foraminal stenosis, unchanged. C5-C6: Disc bulging and uncovertebral spurring result in mild spinal  stenosis and moderate to severe bilateral neural foraminal stenosis, unchanged. C6-C7: Disc bulging and uncovertebral spurring result in mild spinal stenosis and severe bilateral neural foraminal stenosis, unchanged. C7-T1: Anterolisthesis with bulging of uncovered disc, a larger central/right central disc extrusion with cephalad migration, uncovertebral spurring, and severe facet arthrosis result in increased, moderate spinal stenosis and  severe right greater than left neural foraminal stenosis. IMPRESSION: 1. No abnormal intradural enhancement in the cervical spine within limitations of motion artifact. 2. Increased, moderate spinal stenosis and severe right greater than left neural foraminal stenosis at C7-T1. 3. Unchanged cervical disc degeneration elsewhere resulting in up to mild spinal stenosis and severe neural foraminal stenosis. Electronically signed by: Dasie Hamburg MD 05/02/2024 01:31 PM EDT RP Workstation: [id number]   DG FL GUIDED LUMBAR PUNCTURE Result Date: 05/01/2024 CLINICAL DATA:  Provided history: Leptomeningitis, carcinomatous. Request received for fluoroscopically- guided lumbar puncture for additional CSF analysis. EXAM: LUMBAR PUNCTURE UNDER FLUOROSCOPY FLUOROSCOPY: Radiation Exposure Index (as provided by the fluoroscopic device): 13.90 mGy Kerma PROCEDURE: The patient was positioned prone on the fluoroscopy table. An appropriate skin entry site was determined under fluoroscopy and marked. The operator donned sterile gloves and a mask. The lower back was prepped and draped in the usual sterile fashion. Local anesthesia was provided with 1% lidocaine. Under intermittent fluoroscopy, lumbar puncture was performed at the L5-S1 level using a 22 gauge spinal needle with return of clear/colorless CSF and an opening pressure of 17 cm water. Only 6 mL of CSF could be collected at this level. Subsequently, with the patient's consent and after infiltrating locally with 1% lidocaine,  fluoroscopically-guided lumbar puncture was performed at the L4-L5 level using a 20 gauge spinal needle. There was return of clear/colorless CSF and an additional 16 mL of CSF were collected, for a total of 22 mL. All needles were removed. Dressings were applied to the skin entry sites. The patient tolerated the procedure well and no immediate post-procedure complication was apparent. The procedure was performed by Carlin Griffon, PA-C, supervised by Dr. Rockey Childs. IMPRESSION: 1. Fluoroscopically-guided lumbar punctures at the L5-S1 and L4-L5 levels, as described. 2. Opening pressure: 17 cm water. 3. 22 mL of CSF collected and sent for laboratory studies. 4. No immediate post-procedure complication. Electronically Signed   By: Rockey Childs D.O.   On: 05/01/2024 16:56   DG FL GUIDED LUMBAR PUNCTURE Result Date: 04/29/2024 CLINICAL DATA:  68 year old male with 2-3 weeks of progressively worsening aching/burning pain in bilateral legs. Denies any changes in gait or bowel or bladder incontinence. Recent MR lumbar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

## 2024-05-06 NOTE — Progress Notes (Signed)
 PT Cancellation Note  Patient Details Name: Roger Black MRN: [id number] DOB: 07/07/1956   Cancelled Treatment:     Pt remains off floor. Acute PT will continue to follow and return at a later time and date. Will await results of scans and palliative consult for POC moving forward.    Rankin KATHEE Essex 05/06/2024, 1:32 PM

## 2024-05-06 NOTE — Progress Notes (Signed)
 OT Cancellation Note  Patient Details Name: Suleman Gunning MRN: [id number] DOB: [birth year]   Cancelled Treatment:    Reason Eval/Treat Not Completed: Patient at procedure or test/ unavailable. Pt out of the room for additional work up. Will re-attempt OT tx at later date/time as pt is available.   Daleigh Pollinger R., MPH, MS, OTR/L ascom [phone] 05/06/24, 11:39 AM

## 2024-05-06 NOTE — Discharge Summary (Signed)
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 and L4-5. 2. Left-sided facet arthrosis at L5-S1 with widely patent canal and foramina. 3. Mild chronic compression deformities of T11 and T12 with mild focal kyphosis; mild posterior disc bulge at T11-12 causing mild central canal stenosis. Electronically signed by: Evalene Coho MD 04/25/2024 08:15 AM EDT RP Workstation: [id number]    Labs: Basic Metabolic Panel: Recent Labs  Lab 04/30/24 0340 05/01/24 0320 05/02/24 0439  NA 135 134* 132*  K 5.0 5.0 4.5  CL 103 101 99  CO2 23 22 24   GLUCOSE 112* 111* 104*  BUN 25* 26* 24*  CREATININE 1.26* 1.31* 1.12  CALCIUM 9.1 8.9 9.2  MG  --   --  1.9   CBC: Recent Labs  Lab 04/30/24 0340 05/01/24 0320 05/02/24 0439  WBC 6.6 6.5 6.6  NEUTROABS  --  4.4 4.5  HGB 12.5* 11.8* 12.1*  HCT 37.7* 35.3* 35.9*  MCV 99.2 98.6 97.8  PLT 154 140* 161   Microbiology: Results for orders placed or performed during the hospital encounter of 04/28/24  CSF culture w Gram Stain     Status: None   Collection Time: 04/29/24  1:05 PM   Specimen: PATH Cytology CSF; Cerebrospinal Fluid  Result Value Ref Range Status   Specimen Description   Final    FLUID Performed at  [facility], [address] [place], [name] [address]    Special Requests   Final     CYTO CSF Performed at [facility], [address] [place], [name] [address]    Gram Stain   Final    RARE WBC PRESENT,BOTH PMN AND MONONUCLEAR NO ORGANISMS SEEN    Culture   Final    NO GROWTH 3 DAYS Performed at [facility], [address] [place], [name] [address]    Report Status 05/02/2024 FINAL  Final  Fungus Culture With Stain     Status: None (Preliminary result)   Collection Time: 04/29/24  1:05 PM   Specimen: PATH Cytology CSF; Cerebrospinal Fluid  Result Value Ref Range Status   Fungus Stain Final report  Final    Comment: (NOTE) Performed At: [facility] [address] [place], [name] [id number] Jennette Shorter MD Ey:[id number]    Fungus (Mycology) Culture PENDING  Incomplete   Fungal Source  CYTO CSF  Final    Comment: Performed at [facility], [address] [place], [name] [address]  Fungus Culture Result     Status: None   Collection Time: 04/29/24  1:05 PM  Result Value Ref Range Status   Result 1 Comment  Final    Comment: (NOTE) KOH/Calcofluor preparation:  no fungus observed. Performed At: [facility] [address] [place], [name] [id number] Jennette Shorter MD Ey:[id number]   CSF culture w Gram Stain     Status: None   Collection Time: 05/01/24  4:21 PM   Specimen: CSF; Cerebrospinal Fluid  Result Value Ref Range Status   Specimen Description   Final    CSF Performed at [facility], [address], [place], [name] [address]  Special Requests   Final    NONE Performed at [facility], [address] [place], [name] [address]    Gram Stain   Final    RED BLOOD CELLS PRESENT WBC SEEN NO ORGANISMS SEEN GRAM STAIN REVIEWED-AGREE WITH RESULT DRT    Culture   Final    NO GROWTH 3 DAYS Performed at [facility], [address], [place], [name] [address]    Report  Status 05/05/2024 FINAL  Final    Time coordinating discharge: Over 30 minutes  [name], MD  [facility] 05/06/2024, 7:58 PM

## 2024-05-06 NOTE — Progress Notes (Signed)
 NEUROLOGY CONSULT FOLLOW UP NOTE   Date of service: May 06, 2024 Patient Name: Roger Black MRN:  [id number] DOB:  13-Mar-1956  Interval Hx/subjective  Continued pain in BLE, exacerbated by ambulation.   Vitals   Vitals:   05/05/24 1923 05/06/24 0356 05/06/24 0842 05/06/24 1951  BP: 128/73 132/75 (!) 159/96 105/61  Pulse: 93 86 88 96  Resp: 16 16 18 20   Temp: 97.8 F (36.6 C) 97.8 F (36.6 C) 98.3 F (36.8 C) 97.7 F (36.5 C)  TempSrc: Oral   Oral  SpO2: 96% 97% 96% 90%  Weight:      Height:         Body mass index is 31.22 kg/m.  Physical Exam   Constitutional: Appears well-developed and well-nourished.  Psych: Dysthymic affect.  Eyes: No scleral injection.  HENT: No OP obstrucion.  Head: Normocephalic.  Respiratory: Effort normal, non-labored breathing.  Skin: WDI.   Neurologic Examination   Ment: Alert, oriented and interactive. Speech is fluent with intact comprehension. No dysarthria.  CN: Face symmetric, phonation intact.  Motor: 5/5 BUE and BLE.  Sensory: Intact to light touch x 4 DTR: 2+ BUE. 2+ left patellar. 0 right patellar   Medications  Current Facility-Administered Medications:    acetaminophen (TYLENOL) tablet 650 mg, 650 mg, Oral, Q6H PRN, Niu, Xilin, MD, 650 mg at 05/06/24 1103   albuterol (PROVENTIL) (2.5 MG/3ML) 0.083% nebulizer solution 2.5 mg, 2.5 mg, Nebulization, Q4H PRN, Niu, Xilin, MD   aspirin EC tablet 81 mg, 81 mg, Oral, Daily, Niu, Xilin, MD, 81 mg at 05/06/24 9171   atorvastatin (LIPITOR) tablet 80 mg, 80 mg, Oral, Daily, Niu, Xilin, MD, 80 mg at 05/06/24 0827   dextromethorphan-guaiFENesin (MUCINEX DM) 30-600 MG per 12 hr tablet 1 tablet, 1 tablet, Oral, BID PRN, Niu, Xilin, MD   doxazosin (CARDURA) tablet 2 mg, 2 mg, Oral, QHS, Niu, Xilin, MD, 2 mg at 05/06/24 2121   DULoxetine (CYMBALTA) DR capsule 60 mg, 60 mg, Oral, Daily, Michaela Aisha SQUIBB, MD, 60 mg at 05/06/24 0827   fluticasone furoate-vilanterol (BREO  ELLIPTA) 200-25 MCG/ACT 1 puff, 1 puff, Inhalation, Daily, Niu, Xilin, MD, 1 puff at 05/06/24 0828   irbesartan (AVAPRO) tablet 300 mg, 300 mg, Oral, Daily, Niu, Xilin, MD, 300 mg at 05/06/24 0828   lidocaine (LIDODERM) 5 % 1 patch, 1 patch, Transdermal, Q24H, Niu, Xilin, MD, 1 patch at 04/29/24 2045   methocarbamol (ROBAXIN) tablet 500 mg, 500 mg, Oral, QID, Lenon Pons L, MD, 500 mg at 05/06/24 2121   montelukast (SINGULAIR) tablet 10 mg, 10 mg, Oral, Daily, Niu, Xilin, MD, 10 mg at 05/06/24 9171   morphine (PF) 2 MG/ML injection 2 mg, 2 mg, Intravenous, Q4H PRN, Lenon Pons CROME, MD, 2 mg at 05/05/24 2225   ondansetron (ZOFRAN) injection 4 mg, 4 mg, Intravenous, Q8H PRN, Niu, Xilin, MD   oxyCODONE-acetaminophen (PERCOCET/ROXICET) 5-325 MG per tablet 1 tablet, 1 tablet, Oral, Q6H, Lenon Pons CROME, MD, 1 tablet at 05/06/24 2121   pantoprazole (PROTONIX) EC tablet 40 mg, 40 mg, Oral, Daily, Niu, Xilin, MD, 40 mg at 05/06/24 0828   polyethylene glycol (MIRALAX / GLYCOLAX) packet 17 g, 17 g, Oral, BID, Lenon Pons L, MD, 17 g at 05/06/24 2121   pregabalin (LYRICA) capsule 150 mg, 150 mg, Oral, TID, Michaela Aisha SQUIBB, MD, 150 mg at 05/06/24 2122   zolpidem (AMBIEN) tablet 5 mg, 5 mg, Oral, QHS PRN, Niu, Xilin, MD, 5 mg at 05/03/24 2055  Labs and  Diagnostic Imaging   CBC:  Recent Labs  Lab 05/01/24 0320 05/02/24 0439  WBC 6.5 6.6  NEUTROABS 4.4 4.5  HGB 11.8* 12.1*  HCT 35.3* 35.9*  MCV 98.6 97.8  PLT 140* 161    Basic Metabolic Panel:  Lab Results  Component Value Date   NA 132 (L) 05/02/2024   K 4.5 05/02/2024   CO2 24 05/02/2024   GLUCOSE 104 (H) 05/02/2024   BUN 24 (H) 05/02/2024   CREATININE 1.12 05/02/2024   CALCIUM 9.2 05/02/2024   GFRNONAA >60 05/02/2024   Lipid Panel: No results found for: LDLCALC HgbA1c: No results found for: HGBA1C Urine Drug Screen: No results found for: LABOPIA, COCAINSCRNUR, LABBENZ, AMPHETMU, THCU, LABBARB   Alcohol  Level No results found for: [facility] INR  Lab Results  Component Value Date   INR 1.1 04/29/2024   APTT  Lab Results  Component Value Date   APTT 36 04/29/2024   CSF WBC 39 CSF RBC 0 CSF protein 94 CSF glucose 37   CSF cytology - Atypical cells.  CSF flow cytometry: No evidence of monoclonal B-cell or T-cell population    HIV nonreactive ESR and CRP are both normal     Imaging(Personally reviewed): MRI T-spine -enhancement of the thoracic spinal cord MRI L-spine with contrast-enhancing nerve roots  MRI brain w/wo contrast:  1. Circumscribed enhancing lesions in the right frontal lobe (14 x 13 x 13 mm) and left cerebellar hemisphere (17 x 15 x 14 mm), concerning for metastatic disease, with associated vasogenic edema but no significant mass effect. 2. No abnormal leptomeningeal enhancement.  Assessment  Roger Black is a 68 y.o. male with a history of lung cancer who presented with neuropathic pain and was found to have enhancing nerve roots of unclear etiology. MRI brain was then obtained, revealing 2 intraparenchymal lesions appearing most consistent with metastases.  - Exam today is unchanged.  - CT chest abdomen and pelvis with contrast and bone scan does not show any evidence of primary although he was treated for presumed lung cancer with radiation in the past  - I have reviewed the MRI brain w/wo contrast that I ordered yesterday, and agree that the lesions do not look like lymphoma. They are round, well demarcated and brightly enhancing with surrounding edema, suggestive of metastases. They do not have one of the several characteristic appearances of a multifocal/multicentric glioma, especially since they are in two separate brain compartments. They do not look infectious either, given no hyperintensity on DWI and the edema is relatively mild. 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 Suspect that the primary would most likely be his previously diagnosed lung cancer.  - The 2 LPs show atypical cells, but no further information to identify the type of malignancy. I agree that biopsy of the frontal lobe lesion would be the best route towards obtaining a tissue diagnosis.  - Patient is agreeable to proceeding with open brain biopsy..  - Current plan is to transfer the patient to [facility] for biopsy by Dr. [name] of the right frontal lobe lesion on Thursday.     Recommendations  - Transfer to [facility] for brain biopsy - PT/OT - Repeat imaging for any significant changes in his neurological exam - Pain management: - Continue Lyrica at 150 3 times daily.   - His lack of response to lidocaine makes us  less  hopeful that other sodium channel blockers would be likely to be effective. ______________________________________________________________________   Bonney SHARK, Denisia Harpole, MD [facility]

## 2024-05-07 ENCOUNTER — Encounter (HOSPITAL_COMMUNITY): Payer: Self-pay | Admitting: Internal Medicine

## 2024-05-07 ENCOUNTER — Inpatient Hospital Stay (HOSPITAL_COMMUNITY)
Admission: EM | Admit: 2024-05-07 | Discharge: 2024-05-15 | DRG: 025 | Disposition: A | Discharge status: Rehabilitation Facility | Source: Ambulatory Visit | Attending: Internal Medicine | Admitting: Internal Medicine

## 2024-05-07 ENCOUNTER — Other Ambulatory Visit: Payer: Self-pay

## 2024-05-07 ENCOUNTER — Ambulatory Visit: Admission: RE | Admit: 2024-05-07 | Source: Ambulatory Visit

## 2024-05-07 DIAGNOSIS — C7951 Secondary malignant neoplasm of bone: Secondary | ICD-10-CM | POA: Diagnosis not present

## 2024-05-07 DIAGNOSIS — I130 Hypertensive heart and chronic kidney disease with heart failure and stage 1 through stage 4 chronic kidney disease, or unspecified chronic kidney disease: Secondary | ICD-10-CM | POA: Diagnosis not present

## 2024-05-07 DIAGNOSIS — G9389 Other specified disorders of brain: Secondary | ICD-10-CM | POA: Diagnosis not present

## 2024-05-07 DIAGNOSIS — Z923 Personal history of irradiation: Secondary | ICD-10-CM

## 2024-05-07 DIAGNOSIS — G936 Cerebral edema: Secondary | ICD-10-CM | POA: Diagnosis present

## 2024-05-07 DIAGNOSIS — E785 Hyperlipidemia, unspecified: Secondary | ICD-10-CM | POA: Diagnosis not present

## 2024-05-07 DIAGNOSIS — E871 Hypo-osmolality and hyponatremia: Secondary | ICD-10-CM | POA: Diagnosis not present

## 2024-05-07 DIAGNOSIS — M25512 Pain in left shoulder: Secondary | ICD-10-CM | POA: Diagnosis not present

## 2024-05-07 DIAGNOSIS — C7931 Secondary malignant neoplasm of brain: Principal | ICD-10-CM | POA: Diagnosis present

## 2024-05-07 DIAGNOSIS — G9341 Metabolic encephalopathy: Secondary | ICD-10-CM | POA: Diagnosis not present

## 2024-05-07 DIAGNOSIS — D496 Neoplasm of unspecified behavior of brain: Secondary | ICD-10-CM | POA: Diagnosis not present

## 2024-05-07 DIAGNOSIS — F05 Delirium due to known physiological condition: Secondary | ICD-10-CM | POA: Diagnosis not present

## 2024-05-07 DIAGNOSIS — N400 Enlarged prostate without lower urinary tract symptoms: Secondary | ICD-10-CM | POA: Diagnosis present

## 2024-05-07 DIAGNOSIS — Z66 Do not resuscitate: Secondary | ICD-10-CM | POA: Diagnosis not present

## 2024-05-07 DIAGNOSIS — M792 Neuralgia and neuritis, unspecified: Secondary | ICD-10-CM | POA: Diagnosis not present

## 2024-05-07 DIAGNOSIS — J449 Chronic obstructive pulmonary disease, unspecified: Secondary | ICD-10-CM | POA: Diagnosis not present

## 2024-05-07 DIAGNOSIS — Z7951 Long term (current) use of inhaled steroids: Secondary | ICD-10-CM

## 2024-05-07 DIAGNOSIS — M109 Gout, unspecified: Secondary | ICD-10-CM | POA: Diagnosis not present

## 2024-05-07 DIAGNOSIS — N189 Chronic kidney disease, unspecified: Secondary | ICD-10-CM | POA: Diagnosis not present

## 2024-05-07 DIAGNOSIS — M5441 Lumbago with sciatica, right side: Secondary | ICD-10-CM | POA: Diagnosis present

## 2024-05-07 DIAGNOSIS — M5442 Lumbago with sciatica, left side: Secondary | ICD-10-CM | POA: Diagnosis present

## 2024-05-07 DIAGNOSIS — I2510 Atherosclerotic heart disease of native coronary artery without angina pectoris: Secondary | ICD-10-CM | POA: Diagnosis not present

## 2024-05-07 DIAGNOSIS — N179 Acute kidney failure, unspecified: Secondary | ICD-10-CM | POA: Diagnosis not present

## 2024-05-07 DIAGNOSIS — N319 Neuromuscular dysfunction of bladder, unspecified: Secondary | ICD-10-CM | POA: Diagnosis not present

## 2024-05-07 DIAGNOSIS — N3000 Acute cystitis without hematuria: Secondary | ICD-10-CM | POA: Diagnosis not present

## 2024-05-07 DIAGNOSIS — M79604 Pain in right leg: Secondary | ICD-10-CM | POA: Diagnosis not present

## 2024-05-07 DIAGNOSIS — F17210 Nicotine dependence, cigarettes, uncomplicated: Secondary | ICD-10-CM | POA: Diagnosis present

## 2024-05-07 DIAGNOSIS — N3289 Other specified disorders of bladder: Secondary | ICD-10-CM | POA: Diagnosis not present

## 2024-05-07 DIAGNOSIS — Z86008 Personal history of in-situ neoplasm of other site: Secondary | ICD-10-CM

## 2024-05-07 DIAGNOSIS — I5022 Chronic systolic (congestive) heart failure: Secondary | ICD-10-CM | POA: Diagnosis not present

## 2024-05-07 DIAGNOSIS — R5381 Other malaise: Secondary | ICD-10-CM | POA: Diagnosis not present

## 2024-05-07 DIAGNOSIS — Z510 Encounter for antineoplastic radiation therapy: Secondary | ICD-10-CM | POA: Diagnosis not present

## 2024-05-07 DIAGNOSIS — D72829 Elevated white blood cell count, unspecified: Secondary | ICD-10-CM | POA: Diagnosis not present

## 2024-05-07 DIAGNOSIS — J4489 Other specified chronic obstructive pulmonary disease: Secondary | ICD-10-CM | POA: Diagnosis not present

## 2024-05-07 DIAGNOSIS — R262 Difficulty in walking, not elsewhere classified: Secondary | ICD-10-CM | POA: Diagnosis not present

## 2024-05-07 DIAGNOSIS — C801 Malignant (primary) neoplasm, unspecified: Secondary | ICD-10-CM | POA: Diagnosis not present

## 2024-05-07 DIAGNOSIS — Z515 Encounter for palliative care: Secondary | ICD-10-CM | POA: Diagnosis not present

## 2024-05-07 DIAGNOSIS — Z59868 Other specified financial insecurity: Secondary | ICD-10-CM

## 2024-05-07 DIAGNOSIS — G038 Meningitis due to other specified causes: Secondary | ICD-10-CM | POA: Diagnosis present

## 2024-05-07 DIAGNOSIS — C3490 Malignant neoplasm of unspecified part of unspecified bronchus or lung: Secondary | ICD-10-CM | POA: Diagnosis not present

## 2024-05-07 DIAGNOSIS — Z8249 Family history of ischemic heart disease and other diseases of the circulatory system: Secondary | ICD-10-CM | POA: Diagnosis not present

## 2024-05-07 DIAGNOSIS — Z7982 Long term (current) use of aspirin: Secondary | ICD-10-CM

## 2024-05-07 DIAGNOSIS — Z6831 Body mass index (BMI) 31.0-31.9, adult: Secondary | ICD-10-CM | POA: Diagnosis not present

## 2024-05-07 DIAGNOSIS — M549 Dorsalgia, unspecified: Secondary | ICD-10-CM | POA: Diagnosis present

## 2024-05-07 DIAGNOSIS — Z7409 Other reduced mobility: Secondary | ICD-10-CM | POA: Diagnosis present

## 2024-05-07 DIAGNOSIS — K219 Gastro-esophageal reflux disease without esophagitis: Secondary | ICD-10-CM | POA: Diagnosis present

## 2024-05-07 DIAGNOSIS — M1990 Unspecified osteoarthritis, unspecified site: Secondary | ICD-10-CM | POA: Diagnosis present

## 2024-05-07 DIAGNOSIS — N1831 Chronic kidney disease, stage 3a: Secondary | ICD-10-CM | POA: Diagnosis present

## 2024-05-07 DIAGNOSIS — M79605 Pain in left leg: Secondary | ICD-10-CM | POA: Diagnosis not present

## 2024-05-07 DIAGNOSIS — R7303 Prediabetes: Secondary | ICD-10-CM | POA: Diagnosis not present

## 2024-05-07 DIAGNOSIS — I10 Essential (primary) hypertension: Secondary | ICD-10-CM | POA: Diagnosis not present

## 2024-05-07 DIAGNOSIS — I700 Atherosclerosis of aorta: Secondary | ICD-10-CM | POA: Diagnosis not present

## 2024-05-07 DIAGNOSIS — D696 Thrombocytopenia, unspecified: Secondary | ICD-10-CM | POA: Diagnosis not present

## 2024-05-07 DIAGNOSIS — R937 Abnormal findings on diagnostic imaging of other parts of musculoskeletal system: Secondary | ICD-10-CM | POA: Diagnosis not present

## 2024-05-07 DIAGNOSIS — R4189 Other symptoms and signs involving cognitive functions and awareness: Secondary | ICD-10-CM | POA: Diagnosis not present

## 2024-05-07 DIAGNOSIS — R918 Other nonspecific abnormal finding of lung field: Secondary | ICD-10-CM | POA: Diagnosis not present

## 2024-05-07 DIAGNOSIS — E669 Obesity, unspecified: Secondary | ICD-10-CM | POA: Diagnosis present

## 2024-05-07 DIAGNOSIS — Z7189 Other specified counseling: Secondary | ICD-10-CM | POA: Diagnosis not present

## 2024-05-07 DIAGNOSIS — G4733 Obstructive sleep apnea (adult) (pediatric): Secondary | ICD-10-CM | POA: Diagnosis not present

## 2024-05-07 DIAGNOSIS — J439 Emphysema, unspecified: Secondary | ICD-10-CM | POA: Diagnosis present

## 2024-05-07 DIAGNOSIS — C3412 Malignant neoplasm of upper lobe, left bronchus or lung: Secondary | ICD-10-CM | POA: Diagnosis not present

## 2024-05-07 DIAGNOSIS — G4730 Sleep apnea, unspecified: Secondary | ICD-10-CM | POA: Diagnosis present

## 2024-05-07 DIAGNOSIS — R339 Retention of urine, unspecified: Secondary | ICD-10-CM | POA: Diagnosis not present

## 2024-05-07 DIAGNOSIS — N390 Urinary tract infection, site not specified: Secondary | ICD-10-CM | POA: Diagnosis not present

## 2024-05-07 DIAGNOSIS — R0902 Hypoxemia: Secondary | ICD-10-CM | POA: Diagnosis not present

## 2024-05-07 DIAGNOSIS — Z7401 Bed confinement status: Secondary | ICD-10-CM | POA: Diagnosis not present

## 2024-05-07 DIAGNOSIS — R059 Cough, unspecified: Secondary | ICD-10-CM | POA: Diagnosis not present

## 2024-05-07 DIAGNOSIS — Z79899 Other long term (current) drug therapy: Secondary | ICD-10-CM

## 2024-05-07 DIAGNOSIS — R220 Localized swelling, mass and lump, head: Secondary | ICD-10-CM | POA: Diagnosis not present

## 2024-05-07 DIAGNOSIS — I82412 Acute embolism and thrombosis of left femoral vein: Secondary | ICD-10-CM | POA: Diagnosis not present

## 2024-05-07 DIAGNOSIS — J690 Pneumonitis due to inhalation of food and vomit: Secondary | ICD-10-CM | POA: Diagnosis not present

## 2024-05-07 DIAGNOSIS — K5900 Constipation, unspecified: Secondary | ICD-10-CM | POA: Diagnosis not present

## 2024-05-07 HISTORY — DX: Calculus of kidney: N20.0

## 2024-05-07 HISTORY — DX: Emphysema, unspecified: J43.9

## 2024-05-07 HISTORY — DX: Calculus of gallbladder without cholecystitis without obstruction: K80.20

## 2024-05-07 LAB — MAGNESIUM: Magnesium: 2 mg/dL (ref 1.7–2.4)

## 2024-05-07 LAB — HEPATIC FUNCTION PANEL
ALT: 19 U/L (ref 0–44)
AST: 21 U/L (ref 15–41)
Albumin: 3.8 g/dL (ref 3.5–5.0)
Alkaline Phosphatase: 73 U/L (ref 38–126)
Bilirubin, Direct: 0.1 mg/dL (ref 0.0–0.2)
Indirect Bilirubin: 0.8 mg/dL (ref 0.3–0.9)
Total Bilirubin: 0.9 mg/dL (ref 0.0–1.2)
Total Protein: 6.8 g/dL (ref 6.5–8.1)

## 2024-05-07 LAB — SURGICAL PCR SCREEN
MRSA, PCR: NEGATIVE
Staphylococcus aureus: NEGATIVE

## 2024-05-07 LAB — PROTIME-INR
INR: 1 (ref 0.8–1.2)
Prothrombin Time: 14.2 s (ref 11.4–15.2)

## 2024-05-07 LAB — CBC WITH DIFFERENTIAL/PLATELET
Abs Immature Granulocytes: 0.03 K/uL (ref 0.00–0.07)
Basophils Absolute: 0 K/uL (ref 0.0–0.1)
Basophils Relative: 1 %
Eosinophils Absolute: 0.1 K/uL (ref 0.0–0.5)
Eosinophils Relative: 2 %
HCT: 40.3 % (ref 39.0–52.0)
Hemoglobin: 13.6 g/dL (ref 13.0–17.0)
Immature Granulocytes: 0 %
Lymphocytes Relative: 21 %
Lymphs Abs: 1.5 K/uL (ref 0.7–4.0)
MCH: 32.9 pg (ref 26.0–34.0)
MCHC: 33.7 g/dL (ref 30.0–36.0)
MCV: 97.3 fL (ref 80.0–100.0)
Monocytes Absolute: 0.6 K/uL (ref 0.1–1.0)
Monocytes Relative: 8 %
Neutro Abs: 5 K/uL (ref 1.7–7.7)
Neutrophils Relative %: 68 %
Platelets: 180 K/uL (ref 150–400)
RBC: 4.14 MIL/uL — ABNORMAL LOW (ref 4.22–5.81)
RDW: 12.6 % (ref 11.5–15.5)
WBC: 7.3 K/uL (ref 4.0–10.5)
nRBC: 0 % (ref 0.0–0.2)

## 2024-05-07 LAB — BASIC METABOLIC PANEL WITH GFR
Anion gap: 10 (ref 5–15)
BUN: 25 mg/dL — ABNORMAL HIGH (ref 8–23)
CO2: 25 mmol/L (ref 22–32)
Calcium: 9.5 mg/dL (ref 8.9–10.3)
Chloride: 97 mmol/L — ABNORMAL LOW (ref 98–111)
Creatinine, Ser: 1.31 mg/dL — ABNORMAL HIGH (ref 0.61–1.24)
GFR, Estimated: 59 mL/min — ABNORMAL LOW (ref >60)
Glucose, Bld: 124 mg/dL — ABNORMAL HIGH (ref 70–99)
Potassium: 4.9 mmol/L (ref 3.5–5.1)
Sodium: 132 mmol/L — ABNORMAL LOW (ref 135–145)

## 2024-05-07 MED ORDER — DULOXETINE HCL 60 MG PO CPEP
60.0000 mg | ORAL_CAPSULE | Freq: Every day | ORAL | Status: DC
  Administered 2024-05-07 – 2024-05-15 (×7): 60 mg via ORAL
  Filled 2024-05-07 (×7): qty 1

## 2024-05-07 MED ORDER — ZOLPIDEM TARTRATE 5 MG PO TABS
5.0000 mg | ORAL_TABLET | Freq: Every evening | ORAL | Status: DC | PRN
  Administered 2024-05-12: 5 mg via ORAL
  Filled 2024-05-07: qty 1

## 2024-05-07 MED ORDER — ACETAMINOPHEN 325 MG PO TABS
650.0000 mg | ORAL_TABLET | Freq: Four times a day (QID) | ORAL | Status: DC | PRN
  Administered 2024-05-10 – 2024-05-15 (×10): 650 mg via ORAL
  Filled 2024-05-07 (×10): qty 2

## 2024-05-07 MED ORDER — ACETAMINOPHEN 650 MG RE SUPP: 650.0000 mg | Freq: Four times a day (QID) | RECTAL | Status: DC | PRN

## 2024-05-07 MED ORDER — METHOCARBAMOL 500 MG PO TABS
500.0000 mg | ORAL_TABLET | Freq: Four times a day (QID) | ORAL | Status: AC
Start: 2024-05-07 — End: ?
  Administered 2024-05-07 – 2024-05-10 (×9): 500 mg via ORAL
  Filled 2024-05-07 (×9): qty 1

## 2024-05-07 MED ORDER — POLYETHYLENE GLYCOL 3350 17 G PO PACK
17.0000 g | PACK | Freq: Every day | ORAL | Status: DC
  Administered 2024-05-07 – 2024-05-09 (×2): 17 g via ORAL
  Filled 2024-05-07 (×2): qty 1

## 2024-05-07 MED ORDER — PREGABALIN 75 MG PO CAPS
150.0000 mg | ORAL_CAPSULE | Freq: Three times a day (TID) | ORAL | Status: DC
  Administered 2024-05-07 – 2024-05-15 (×22): 150 mg via ORAL
  Filled 2024-05-07 (×22): qty 2

## 2024-05-07 MED ORDER — PANTOPRAZOLE SODIUM 40 MG PO TBEC
40.0000 mg | DELAYED_RELEASE_TABLET | Freq: Every day | ORAL | Status: DC
  Administered 2024-05-07 – 2024-05-15 (×8): 40 mg via ORAL
  Filled 2024-05-07 (×8): qty 1

## 2024-05-07 MED ORDER — MORPHINE SULFATE (PF) 2 MG/ML IV SOLN
2.0000 mg | INTRAVENOUS | Status: DC | PRN
  Administered 2024-05-07 – 2024-05-08 (×2): 2 mg via INTRAVENOUS
  Filled 2024-05-07 (×2): qty 1

## 2024-05-07 MED ORDER — FLUTICASONE FUROATE-VILANTEROL 100-25 MCG/ACT IN AEPB
1.0000 | INHALATION_SPRAY | Freq: Every day | RESPIRATORY_TRACT | Status: DC
  Administered 2024-05-07 – 2024-05-15 (×5): 1 via RESPIRATORY_TRACT
  Filled 2024-05-07 (×2): qty 28

## 2024-05-07 MED ORDER — MONTELUKAST SODIUM 10 MG PO TABS
10.0000 mg | ORAL_TABLET | Freq: Every day | ORAL | Status: DC
  Administered 2024-05-07 – 2024-05-15 (×8): 10 mg via ORAL
  Filled 2024-05-07 (×8): qty 1

## 2024-05-07 MED ORDER — OXYCODONE-ACETAMINOPHEN 5-325 MG PO TABS
1.0000 | ORAL_TABLET | Freq: Four times a day (QID) | ORAL | Status: DC
Start: 2024-05-07 — End: 2024-05-08
  Administered 2024-05-07 (×3): 1 via ORAL
  Filled 2024-05-07 (×4): qty 1

## 2024-05-07 MED ORDER — IRBESARTAN 150 MG PO TABS
300.0000 mg | ORAL_TABLET | Freq: Every day | ORAL | Status: DC
  Administered 2024-05-07 – 2024-05-15 (×7): 300 mg via ORAL
  Filled 2024-05-07 (×2): qty 2
  Filled 2024-05-07: qty 1
  Filled 2024-05-07 (×4): qty 2

## 2024-05-07 MED ORDER — DOXAZOSIN MESYLATE 2 MG PO TABS
2.0000 mg | ORAL_TABLET | Freq: Every day | ORAL | Status: DC
  Administered 2024-05-07 – 2024-05-14 (×8): 2 mg via ORAL
  Filled 2024-05-07 (×9): qty 1

## 2024-05-07 MED ORDER — ATORVASTATIN CALCIUM 80 MG PO TABS
80.0000 mg | ORAL_TABLET | Freq: Every day | ORAL | Status: DC
  Administered 2024-05-07 – 2024-05-15 (×8): 80 mg via ORAL
  Filled 2024-05-07 (×8): qty 1

## 2024-05-07 NOTE — Anesthesia Preprocedure Evaluation (Addendum)
 Anesthesia Evaluation  Patient identified by MRN, date of birth, ID band Patient awake    Reviewed: Allergy & Precautions, H&P , NPO status , Patient's Chart, lab work & pertinent test results  Airway Mallampati: II  TM Distance: >3 FB Neck ROM: Full    Dental no notable dental hx. (+) Edentulous Upper, Partial Lower, Dental Advisory Given   Pulmonary asthma , sleep apnea , COPD,  COPD inhaler, Patient abstained from smoking., former smoker   Pulmonary exam normal breath sounds clear to auscultation       Cardiovascular Exercise Tolerance: Good hypertension, Pt. on medications + CAD and +CHF   Rhythm:Regular Rate:Normal     Neuro/Psych negative neurological ROS  negative psych ROS   GI/Hepatic negative GI ROS, Neg liver ROS,,,  Endo/Other  negative endocrine ROS    Renal/GU Renal InsufficiencyRenal disease  negative genitourinary   Musculoskeletal  (+) Arthritis , Osteoarthritis,    Abdominal   Peds  Hematology negative hematology ROS (+)   Anesthesia Other Findings   Reproductive/Obstetrics negative OB ROS                              Anesthesia Physical Anesthesia Plan  ASA: 3  Anesthesia Plan: General   Post-op Pain Management: Ofirmev IV (intra-op)*   Induction: Intravenous  PONV Risk Score and Plan: 3 and Ondansetron, Dexamethasone and Propofol  infusion  Airway Management Planned: Oral ETT  Additional Equipment: Arterial line  Intra-op Plan:   Post-operative Plan: Extubation in OR  Informed Consent: I have reviewed the patients History and Physical, chart, labs and discussed the procedure including the risks, benefits and alternatives for the proposed anesthesia with the patient or authorized representative who has indicated his/her understanding and acceptance.     Dental advisory given  Plan Discussed with: CRNA  Anesthesia Plan Comments:           Anesthesia Quick Evaluation

## 2024-05-07 NOTE — Progress Notes (Signed)
 Pt somewhat confused after waking up.  Asking how did I get here?  Stated he was having cancer surgery tomorrow, but then asked why he was in the hospital.  Dr. Arlon notified.

## 2024-05-07 NOTE — TOC Initial Note (Signed)
 Transition of Care [facility]) - Initial/Assessment Note    Patient Details  Name: Roger Black MRN: [id number] Date of Birth: 02-15-1956  [facility] CM/SW Contact:    Andrez JULIANNA George, RN Phone Number: 05/07/2024, 1:22 PM  Clinical Narrative:                 Roger Black is a 68 y.o. male with medical history significant of dCHF with EF 45%, HTN, HLD, CAD, former smoker, COPD/asthma, GERD, CKD-3, skin cancer, obesity, who presents with leg pain.   Plan: right-sided craniotomy for excisional biopsy of this right insular tumor   Pt is from home with his spouse. She is home all the time. Pt was working 10 hour days.  Pt manages his own medications and was driving self. Wife can provide needed transportation.  IP Care management following.  Expected Discharge Plan: Home/Self Care Barriers to Discharge: Continued Medical Work up   Patient Goals and CMS Choice            Expected Discharge Plan and Services       Living arrangements for the past 2 months: Single Family Home                                      Prior Living Arrangements/Services Living arrangements for the past 2 months: Single Family Home Lives with:: Spouse Patient language and need for interpreter reviewed:: Yes Do you feel safe going back to the place where you live?: Yes        Care giver support system in place?: Yes (comment) Current home services: DME ([facility]) Criminal Activity/Legal Involvement Pertinent to Current Situation/Hospitalization: No - Comment as needed  Activities of Daily Living   ADL Screening (condition at time of admission) Independently performs ADLs?: Yes (appropriate for developmental age) Is the patient deaf or have difficulty hearing?: No Does the patient have difficulty seeing, even when wearing glasses/contacts?: No Does the patient have difficulty concentrating, remembering, or making decisions?: No  Permission Sought/Granted                   Emotional Assessment Appearance:: Appears stated age Attitude/Demeanor/Rapport: Engaged Affect (typically observed): Accepting Orientation: : Oriented to Self, Oriented to Place, Oriented to  Time, Oriented to Situation   Psych Involvement: No (comment)  Admission diagnosis:  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] Lumbago with sciatica, left side [M54.42] Brain mass [G93.89] Patient Active Problem List   Diagnosis Date Noted   Brain mass 05/07/2024   Abnormal findings on diagnostic imaging of spine 04/29/2024   Back pain 04/29/2024   Leg pain 04/28/2024   Obesity (BMI 30-39.9) 04/28/2024   HTN (hypertension) 04/28/2024   Hyperlipidemia    Chronic systolic CHF (congestive heart failure) (HCC)    Coronary artery disease    COPD (chronic obstructive pulmonary disease) (HCC)    Chronic kidney disease, stage 3a (HCC)    Personal history of tobacco use, presenting hazards to health 09/21/2015   PCP:  [name], MD Pharmacy:   [id number] [name], [place] - [address] AT [facility] [name] [name] [address] Phone: [phone] Fax: [phone]  [facility] - [place], [name] - [address] [name] [address] Phone: [phone] Fax: [phone]     Social Drivers of Health (  SDOH) Social History: SDOH Screenings   Food Insecurity: No Food Insecurity (05/07/2024)  Housing: High Risk (05/07/2024)  Transportation Needs: No Transportation Needs (05/07/2024)  Utilities: Not At Risk (05/07/2024)  Financial Resource Strain: Low Risk  (03/07/2024)   Received from [facility]  Recent Concern: Financial Resource Strain - High Risk (01/29/2024)   Received from [facility]  Physical Activity: Inactive (08/07/2023)   Received from [facility]  Social Connections: Unknown (05/07/2024)  Stress: Stress  Concern Present (08/07/2023)   Received from [facility]  Tobacco Use: Medium Risk (05/07/2024)  Health Literacy: Adequate Health Literacy (08/07/2023)   Received from [facility]   SDOH Interventions:     Readmission Risk Interventions     No data to display

## 2024-05-07 NOTE — Consult Note (Signed)
 Assessment : 68 year old gentleman with a past medical history significant for COPD and lung cancer s/p radiation therapy in December 2024, chronic kidney disease.  His lung cancer was staged at grade 1.  Patient was admitted at [facility] hospital for worsening of his leg pain which has been going on for few months.  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  Multiple lumbar punctures were done with atypical cells but no cancer cells were found.  In addition to this, he was found to have a left cerebellar and a right insular lesion with the appearance of metastases.  Options were discussed with the oncology team and given the relatively low grade of his initial cancer, empiric treatment was not felt to be desired.  To that end, patient was transferred to us  to obtain a biopsy from the cerebral lesions.  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  Patient is accompanied by his wife.  Plan : I reviewed the findings on the MRI with him and his wife.  I shared with him that he has 2 lesions and that a biopsy of one of them was requested.  Given the fact that the right supratentorial 1 is most approachable, I recommended a right sided craniotomy for biopsy/subtotal resection of this lesion [the lesion is attached to a vessel and therefore for resection likely not possible].  I went over the option of doing nothing versus surgery and I discussed the risks and benefits of surgery.  I discussed the risk of infection, hemorrhage, stroke, paralysis, seizures as well as general anesthetic concerns of DVT/PE, cardiopulmonary complication and death with them.  After discussing all these as well as the perioperative course, they requested for us  to proceed.  I will schedule him for a right-sided craniotomy for excisional biopsy of this  right insular tumor.   Social History   Socioeconomic History   Marital status: Married    Spouse name: Not on file   Number of children: Not on file   Years of education: Not on file   Highest education level: Not on file  Occupational History   Not on file  Tobacco Use   Smoking status: Former    Current packs/day: 1.00    Average packs/day: 1 pack/day for 47.0 years (47.0 ttl pk-yrs)    Types: Cigarettes   Smokeless tobacco: Never  Vaping Use   Vaping status: Never Used  Substance and Sexual Activity   Alcohol  use: Never   Drug use: Never   Sexual activity: Not on file  Other Topics Concern   Not on file  Social History Narrative   Not on file   Social Drivers of Health   Financial Resource Strain: Low Risk  (03/07/2024)   [facility] from [facility]   Overall Financial Resource Strain (CARDIA)    Difficulty of Paying Living Expenses: Not hard at all  Recent Concern: Financial Resource Strain - High Risk (01/29/2024)   Received from [facility]   Overall Financial Resource Strain (CARDIA)    Difficulty of Paying Living Expenses: Hard  Food Insecurity: No Food Insecurity (05/07/2024)   Hunger Vital Sign    Worried About Running Out of Food in the Last Year: Never true    Ran Out of Food in the Last Year: Never true  Transportation Needs: No Transportation Needs (05/07/2024)  PRAPARE - Administrator, Civil Service (Medical): No    Lack of Transportation (Non-Medical): No  Physical Activity: Inactive (08/07/2023)   Received from [facility]   Exercise Vital Sign    On average, how many days per week do you engage in moderate to strenuous exercise (like a brisk walk)?: 0 days    On average, how many minutes do you engage in exercise at this level?: 0 min  Stress: Stress Concern Present (08/07/2023)   Received from [facility] Occupational  Stress Questionnaire    Feeling of Stress : Rather much  Social Connections: Unknown (05/07/2024)   Social Connection and Isolation Panel    Frequency of Communication with Friends and Family: Three times a week    Frequency of Social Gatherings with Friends and Family: Three times a week    Attends Religious Services: Patient declined    Active Member of Clubs or Organizations: Patient declined    Attends Banker Meetings: Patient declined    Marital Status: Married  Catering manager Violence: Not At Risk (04/29/2024)   Humiliation, Afraid, Rape, and Kick questionnaire    Fear of Current or Ex-Partner: No    Emotionally Abused: No    Physically Abused: No    Sexually Abused: No    Family History  Problem Relation Age of Onset   Hypertension Mother    Hypertension Father     No Known Allergies  Past Medical History:  Diagnosis Date   Actinic keratosis    Aortic atherosclerosis    Arthritis    Asthma    Chronic kidney disease, stage 3a (HCC)    Chronic systolic CHF (congestive heart failure) (HCC)    COPD (chronic obstructive pulmonary disease) (HCC)    Coronary artery disease    HTN (hypertension)    Hyperlipidemia    Personal history of tobacco use, presenting hazards to health 09/21/2015   Sleep apnea    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     Nose: Nose normal.  Eyes:     Pupils: Pupils are equal, round, and reactive to light.  Cardiovascular:     Rate and Rhythm: Normal  rate.  Pulmonary:     Effort: Pulmonary effort is normal.  Abdominal:     General: Abdomen is flat.  Musculoskeletal:     Cervical back: Normal range of motion.  Neurological:     General: No focal deficit present.     Mental Status: He is alert.     Cranial Nerves: Cranial nerves 2-12 are intact.     Sensory: Sensation is intact.     Motor: Motor function is intact.     Coordination: Coordination is intact.     Comments: Gait not tested        Results for orders placed or performed during the hospital encounter of 04/28/24  MR BRAIN W WO CONTRAST   Narrative   EXAM: MRI BRAIN  WITH AND WITHOUT CONTRAST 05/05/2024 09:15:52 PM  TECHNIQUE: Multiplanar multisequence MRI of the head/brain was performed with and without the administration of 10 mL gadobutrol (GADAVIST) 1 MMOL/ML injection.  COMPARISON: None available.  CLINICAL HISTORY: Abnormal lumbar and thoracic leptomeningeal enhancement. Known cancer patient.  FINDINGS:  BRAIN AND VENTRICLES: No acute infarct. No acute intracranial hemorrhage. No midline shift. No hydrocephalus. The sella is unremarkable. Normal flow voids. There are circumscribed enhancing lesions present within the right frontal lobe and left cerebellar hemisphere, which are concerning for metastatic disease. The lesion within the right frontal lobe is seen on image 91 of series 20 and measures approximately 14 x 13 x 13 mm. The lesion within the left cerebellar hemisphere is seen on image 54, measuring approximately 17 x 15 x 14 mm. There is moderate vasogenic edema associated with the left cerebellar lesion but there is no significant mass effect. There is also vasogenic edema associated with the lesion in the right frontal lobe, also without significant mass effect. There is a focal area of gliosis in the periventricular and deep white matter of the left frontal lobe which is nonenhancing. There are also scattered foci of increased T2 signal  throughout the cerebral white matter bilaterally. There is no abnormal leptomeningeal enhancement demonstrated.  ORBITS: No acute abnormality.  SINUSES: No acute abnormality.  BONES AND SOFT TISSUES: Normal bone marrow signal and enhancement. No acute soft tissue abnormality.  IMPRESSION: 1. Circumscribed enhancing lesions in the right frontal lobe (14 x 13 x 13 mm) and left cerebellar hemisphere (17 x 15 x 14 mm), concerning for metastatic disease, with associated vasogenic edema but no significant mass effect. 2. No abnormal leptomeningeal enhancement.  Electronically signed by: Evalene Coho MD 05/06/2024 04:34 AM EDT RP Workstation: [id number]

## 2024-05-07 NOTE — Progress Notes (Signed)
 NEUROLOGY CONSULT FOLLOW UP NOTE   Date of service: May 07, 2024 Patient Name: Roger Black MRN:  [id number] DOB:  Feb 18, 1956  Interval Hx/subjective  Continued pain in BLE, unchanged, exacerbated by ambulation.  Patient reports needing to have a BM today but with significant concern about getting to the restroom as he has continued pain with ambulation in bilateral lower extremities.  Patient states that he feels like fire down deep in his legs bilaterally and explains that he feels that he constantly has to reposition his feet for comfort.   Vitals   Vitals:   05/07/24 0335 05/07/24 0800 05/07/24 0841 05/07/24 0844  BP: 127/80  121/80   Pulse: 98  90 90  Resp: 18  20 12   Temp: 98 F (36.7 C)  98.7 F (37.1 C)   TempSrc: Oral  Oral   SpO2: 91%  93% 93%  Height:  5' 9 (1.753 m)      Body mass index is 31.22 kg/m.  Physical Exam   Constitutional: Appears well-developed and well-nourished.  Psych: Dysthymic affect.  Eyes: No scleral injection.  HENT: No OP obstrucion.  Head: Normocephalic.  Respiratory: Effort normal, non-labored breathing.  Skin: WDI.   Neurologic Examination   Ment: Alert, oriented and interactive. Speech is fluent with intact comprehension. No dysarthria.  CN: Face symmetric, phonation intact.  Motor: 5/5 BUE, right KF 5/5, left 4-/5, 4/5 hip flexion bilaterally with pain-limited assessment of lower extremity strength this morning Sensory: Intact to light touch x 4 DTR: 2+ BUE. 2+ left patellar. 0 right patellar   Medications  Current Facility-Administered Medications:    acetaminophen (TYLENOL) tablet 650 mg, 650 mg, Oral, Q6H PRN **OR** acetaminophen (TYLENOL) suppository 650 mg, 650 mg, Rectal, Q6H PRN, [name], MD   atorvastatin (LIPITOR) tablet 80 mg, 80 mg, Oral, Daily, [name], [name], MD   doxazosin (CARDURA) tablet 2 mg, 2 mg, Oral, QHS, [name], [name], MD   DULoxetine (CYMBALTA) DR capsule 60 mg, 60  mg, Oral, Daily, [name], [name], MD   fluticasone furoate-vilanterol (BREO ELLIPTA) 100-25 MCG/ACT 1 puff, 1 puff, Inhalation, Daily, [name], MD, 1 puff at 05/07/24 0844   irbesartan (AVAPRO) tablet 300 mg, 300 mg, Oral, Daily, [name], [name], MD   methocarbamol (ROBAXIN) tablet 500 mg, 500 mg, Oral, Q6H, [name], [name], MD, 500 mg at 05/07/24 0519   montelukast (SINGULAIR) tablet 10 mg, 10 mg, Oral, Daily, [name], [name], MD   morphine (PF) 2 MG/ML injection 2 mg, 2 mg, Intravenous, Q4H PRN, [name], MD   oxyCODONE-acetaminophen (PERCOCET/ROXICET) 5-325 MG per tablet 1 tablet, 1 tablet, Oral, Q6H, [name], MD, 1 tablet at 05/07/24 0519   pantoprazole (PROTONIX) EC tablet 40 mg, 40 mg, Oral, Daily, [name], MD   pregabalin (LYRICA) capsule 150 mg, 150 mg, Oral, TID, [name], MD   zolpidem (AMBIEN) tablet 5 mg, 5 mg, Oral, QHS PRN, [name], MD  Labs and Diagnostic Imaging   CBC:  Recent Labs  Lab 05/02/24 0439 05/07/24 0555  WBC 6.6 7.3  NEUTROABS 4.5 5.0  HGB 12.1* 13.6  HCT 35.9* 40.3  MCV 97.8 97.3  PLT 161 180   Basic Metabolic Panel:  Lab Results  Component Value Date   NA 132 (L) 05/07/2024   K 4.9 05/07/2024   CO2 25 05/07/2024   GLUCOSE 124 (H) 05/07/2024   BUN 25 (H) 05/07/2024   CREATININE 1.31 (H) 05/07/2024  CALCIUM 9.5 05/07/2024   GFRNONAA 59 (L) 05/07/2024   Lipid Panel: No results found for: LDLCALC HgbA1c: No results found for: HGBA1C Urine Drug Screen: No results found for: LABOPIA, COCAINSCRNUR, LABBENZ, AMPHETMU, THCU, LABBARB  Alcohol  Level No results found for: [facility] INR  Lab Results  Component Value Date   INR 1.0 05/07/2024   APTT  Lab Results  Component Value Date   APTT 36 04/29/2024   CSF WBC 39 CSF RBC 0 CSF protein 94 CSF glucose 37   CSF cytology - Atypical cells.  CSF flow cytometry: No evidence of monoclonal  B-cell or T-cell population    HIV nonreactive ESR and CRP are both normal   Imaging(Personally reviewed): MRI T-spine -enhancement of the thoracic spinal cord MRI L-spine with contrast-enhancing nerve roots  MRI brain w/wo contrast:  1. Circumscribed enhancing lesions in the right frontal lobe (14 x 13 x 13 mm) and left cerebellar hemisphere (17 x 15 x 14 mm), concerning for metastatic disease, with associated vasogenic edema but no significant mass effect. 2. No abnormal leptomeningeal enhancement.  Assessment  Roger Black is a 68 y.o. male with a history of lung cancer who presented with neuropathic pain and was found to have enhancing nerve roots of unclear etiology. MRI brain was then obtained, revealing 2 intraparenchymal lesions appearing most consistent with metastases.  - Exam today is unchanged.  - CT chest abdomen and pelvis with contrast and bone scan does not show any evidence of primary although he was treated for presumed lung cancer with radiation in the past  - I have reviewed the MRI brain w/wo contrast that I ordered yesterday, and agree that the lesions do not look like lymphoma. They are round, well demarcated and brightly enhancing with surrounding edema, suggestive of metastases. They do not have one of the several characteristic appearances of a multifocal/multicentric glioma, especially since they are in two separate brain compartments. They do not look infectious either, given no hyperintensity on DWI and the edema is relatively mild. 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 Suspect that the primary would most likely be his previously diagnosed lung cancer.  - The 2 LPs show atypical cells, but no further information to identify the type of malignancy. I agree that biopsy of the frontal lobe lesion would be the best  route towards obtaining a tissue diagnosis.  - Patient is agreeable to proceeding with open brain biopsy..  - Current plan is for biopsy by Dr. [name] of the right frontal lobe lesion on Thursday.   Recommendations  - Transfer to [address] [facility] for brain biopsy - Management of bowel complaints per primary team  - PT/OT - Repeat imaging for any significant changes in his neurological exam - Patient remains on Robaxin, Percocet, and Lyrica for pain control  - Pain management: - Continue Lyrica at 150 3 times daily.   - His lack of response to lidocaine infusion makes us  less hopeful that other sodium channel blockers would be likely to be effective. - Consider consult with Palliative Care or Anesthesia for pain management. ______________________________________________________________________  Stevi Toberman, AGACNP-BC [facility] Pager: [phone]  NEUROHOSPITALIST ADDENDUM Performed a face to face diagnostic evaluation.   I have reviewed the contents of history and physical exam as documented by PA/ARNP/Resident and agree with above documentation.  I have discussed and formulated the above plan  as documented. Edits to the note have been made as needed.  Byanka Landrus, MD [facility]

## 2024-05-07 NOTE — H&P (Signed)
 History and Physical    Roger Black FMW:[id number] DOB: March 13, 1956 DOA: 05/07/2024  Patient coming from: Transfer from [facility].  Chief Complaint: Back pain.  HPI: Roger Black is a 68 y.o. male with history of COPD, HFrEF, lung cancer status post radiation therapy last December, chronic kidney disease, hypertension was [facility] for worsening leg pain which has been ongoing for last few months.  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  Infectious or inflammatory etiologies were considered in the differential.  Patient had lumbar puncture done with CSF analysis and cytology showed atypical cells with predominant lymphocytes but was not conclusive for malignancy.  Flow cytometry on the CSF did not show any evidence of monoclonal B cell or T-cell lymphocyte population.  Patient had MRI of the brain with and without contrast which showed circumscribed enhancing lesions in the right frontal lobe and left cerebral hemisphere concerning for metastatic disease and associated vasogenic edema but no significant mass effect.  No abnormal leptomeningeal enhancement.  CT chest abdomen pelvis with contrast and bone scan did not show any evidence of primary malignancy.  At this point it was decided patient to be transferred to [facility] for further evaluation by neurosurgery for brain biopsy.  At the time of my exam patient is not in distress.  Denies chest pain shortness of breath.  Last labs done on May 01, 2024 showed sodium of 132 creatinine 1.1 hemoglobin 12.1 platelets 161.  ED Course: Patient is a direct transfer.  Review of Systems: As per HPI, rest all negative.   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 His smoking use included cigarettes. He has a 47 pack-year smoking history. He has never used smokeless tobacco. He reports that he does not drink alcohol  and does not use drugs.  No Known Allergies  Family History  Problem Relation Age of Onset   Hypertension Mother    Hypertension Father     Prior to Admission medications   Medication Sig Start Date End Date Taking? Authorizing Provider  acetaminophen (TYLENOL) 325 MG tablet Take 2 tablets (650 mg total) by mouth every 6 (six) hours as needed for mild pain (pain score 1-3) or fever. 05/06/24   [name], MD  albuterol (VENTOLIN HFA) 108 (90 Base) MCG/ACT inhaler Inhale into the lungs. 06/07/20   [provider]  aspirin 81 MG EC tablet Take 81 mg by mouth daily.  [provider]  atorvastatin (LIPITOR) 80 MG tablet Take 80 mg by mouth daily. 08/19/19 04/28/24  [provider]  budesonide-formoterol (SYMBICORT) 160-4.5 MCG/ACT inhaler Inhale 2 puffs into the lungs 2 (two) times daily.    [provider]  doxazosin (CARDURA) 2 MG tablet Take 2 mg by mouth at bedtime.    [provider]  DULoxetine (CYMBALTA) 60 MG  capsule Take 1 capsule (60 mg total) by mouth daily. 05/07/24   [name], MD  fluorouracil  (EFUDEX ) 5 % cream Apply topically 2 (two) times daily. Apply twice daily to aa scalp and back of hands until red and irritated, about day 7 10/24/23   Claudene Lehmann, MD  HYDROcodone-acetaminophen (NORCO/VICODIN) 5-325 MG tablet Take 1 tablet by mouth 2 (two) times daily as needed for moderate pain (pain score 4-6). 04/23/24   [provider]  ipratropium (ATROVENT) 0.03 % nasal spray Place into the nose. 12/26/22 04/28/24  [provider]  methocarbamol (ROBAXIN) 500 MG tablet Take 1 tablet (500 mg total) by mouth 4 (four) times daily. 05/06/24   [name], MD  montelukast (SINGULAIR) 10 MG tablet Take 10 mg by mouth daily. 06/03/20   [provider]  Morphine Sulfate (MORPHINE, PF,) 2 MG/ML injection Inject 1 mL (2 mg total) into the vein every 4 (four) hours as needed (breakthrough pain). 05/06/24   [name], MD  Multiple Vitamin (MULTI-VITAMIN) tablet Take by mouth. Patient not taking: Reported on 04/28/2024    [provider]  olmesartan (BENICAR) 40 MG tablet Take 40 mg by mouth daily.    [provider]  ondansetron (ZOFRAN) 4 MG/2ML SOLN injection Inject 2 mLs (4 mg total) into the vein every 8 (eight) hours as needed for nausea or vomiting. 05/06/24   [name], MD  oxyCODONE-acetaminophen (PERCOCET/ROXICET) 5-325 MG tablet Take 1 tablet by mouth every 6 (six) hours. 05/06/24   [name], MD  pantoprazole (PROTONIX) 40 MG tablet Take 40 mg by mouth daily. 12/03/19 04/28/24  [provider]  pregabalin (LYRICA) 150 MG capsule Take 1 capsule (150 mg total) by mouth 3 (three) times daily. 05/06/24   [name], MD  spironolactone (ALDACTONE) 50 MG tablet Take 50 mg by mouth daily. 02/28/24 02/27/25  [provider]  torsemide (DEMADEX) 100 MG tablet Take 100 mg by mouth daily as needed  (edema). 02/28/24 02/27/25  [provider]  zolpidem (AMBIEN) 5 MG tablet Take 1 tablet (5 mg total) by mouth at bedtime as needed for sleep. 05/06/24   [name], MD    Physical Exam: Constitutional: Moderately built and nourished. Vitals:   05/07/24 0059 05/07/24 0335  BP: 130/88 127/80  Pulse: 96 98  Resp: 18 18  Temp: 98.3 F (36.8 C) 98 F (36.7 C)  TempSrc: Oral Oral  SpO2: 96% 91%   Eyes: Anicteric no pallor. ENMT: No discharge from ears eyes nose normal. Neck: No mass felt.  No neck rigidity. Respiratory: No rhonchi or crepitations. Cardiovascular: S1-S2 heard. Abdomen: Soft nontender bowel sound present. Musculoskeletal: No edema. Skin: No rash. Neurologic: Alert awake oriented to time place and person.  Moves all extremities. Psychiatric: Appears normal.  Normal affect.   Labs on Admission: I have personally reviewed following labs and imaging studies  CBC: Recent Labs  Lab 05/01/24 0320 05/02/24 0439  WBC 6.5 6.6  NEUTROABS 4.4 4.5  HGB 11.8* 12.1*  HCT 35.3* 35.9*  MCV 98.6 97.8  PLT 140* 161  Basic Metabolic Panel: Recent Labs  Lab 05/01/24 0320 05/02/24 0439  NA 134* 132*  K 5.0 4.5  CL 101 99  CO2 22 24  GLUCOSE 111* 104*  BUN 26* 24*  CREATININE 1.31* 1.12  CALCIUM 8.9 9.2  MG  --  1.9   GFR: Estimated Creatinine Clearance: 72.1 mL/min (by C-G formula based on SCr of 1.12 mg/dL). Liver Function Tests: Recent Labs  Lab 05/01/24 0320 05/02/24 0439  AST 23 19  ALT 15 16  ALKPHOS 48 59  BILITOT 1.0 0.8  PROT 6.5 6.6  ALBUMIN 3.6 3.8   No results for input(s): LIPASE, AMYLASE in the last 168 hours. No results for input(s): AMMONIA in the last 168 hours. Coagulation Profile: No results for input(s): INR, PROTIME in the last 168 hours. Cardiac Enzymes: No results for input(s): CKTOTAL, CKMB, CKMBINDEX, TROPONINI in the last 168 hours. BNP (last 3 results) No results for input(s): PROBNP in the  last 8760 hours. HbA1C: No results for input(s): HGBA1C in the last 72 hours. CBG: No results for input(s): GLUCAP in the last 168 hours. Lipid Profile: No results for input(s): CHOL, HDL, LDLCALC, TRIG, CHOLHDL, LDLDIRECT in the last 72 hours. Thyroid  Function Tests: No results for input(s): TSH, T4TOTAL, FREET4, T3FREE, THYROIDAB in the last 72 hours. Anemia Panel: No results for input(s): VITAMINB12, FOLATE, FERRITIN, TIBC, IRON, RETICCTPCT in the last 72 hours. Urine analysis: No results found for: COLORURINE, APPEARANCEUR, LABSPEC, PHURINE, GLUCOSEU, HGBUR, BILIRUBINUR, KETONESUR, PROTEINUR, UROBILINOGEN, NITRITE, LEUKOCYTESUR Sepsis Labs: @LABRCNTIP (procalcitonin:4,lacticidven:4) ) Recent Results (from the past 240 hours)  CSF culture w Gram Stain     Status: None   Collection Time: 04/29/24  1:05 PM   Specimen: PATH Cytology CSF; Cerebrospinal Fluid  Result Value Ref Range Status   Specimen Description   Final    FLUID Performed at [facility] [address], [place], [name] [address]    Special Requests   Final     CYTO CSF Performed at [facility], [address] [place], [name] [address]    Gram Stain   Final    RARE WBC PRESENT,BOTH PMN AND MONONUCLEAR NO ORGANISMS SEEN    Culture   Final    NO GROWTH 3 DAYS Performed at [facility], [address], [place], [name] [address]    Report Status 05/02/2024 FINAL  Final  Fungus Culture With Stain     Status: None (Preliminary result)   Collection Time: 04/29/24  1:05 PM   Specimen: PATH Cytology CSF; Cerebrospinal Fluid  Result Value Ref Range Status   Fungus Stain Final report  Final    Comment: (NOTE) Performed At: [facility] [address] [place], [name] [id number] Jennette Shorter MD Ey:[id number]    Fungus (Mycology) Culture PENDING  Incomplete   Fungal Source  CYTO CSF  Final    Comment: Performed at  [facility], [address] [place], [name] [address]  Fungus Culture Result     Status: None   Collection Time: 04/29/24  1:05 PM  Result Value Ref Range Status   Result 1 Comment  Final    Comment: (NOTE) KOH/Calcofluor preparation:  no fungus observed. Performed At: [facility] [address] [place], [name] [id number] Jennette Shorter MD Ey:[id number]   CSF culture w Gram Stain     Status: None   Collection Time: 05/01/24  4:21 PM   Specimen: CSF; Cerebrospinal Fluid  Result Value Ref Range Status   Specimen Description  Final    CSF Performed at [facility], [address] [place], [name] [address]    Special Requests   Final    NONE Performed at [facility], [address] [place], [name] [address]    Gram Stain   Final    RED BLOOD CELLS PRESENT WBC SEEN NO ORGANISMS SEEN GRAM STAIN REVIEWED-AGREE WITH RESULT DRT    Culture   Final    NO GROWTH 3 DAYS Performed at [facility], [address], [place], [name] [address]    Report Status 05/05/2024 FINAL  Final     Radiological Exams on Admission: CT CHEST ABDOMEN PELVIS W CONTRAST Result Date: 05/06/2024 CLINICAL DATA:  Non-small cell lung cancer, staging. * Tracking Code: BO * EXAM: CT CHEST, ABDOMEN, AND PELVIS WITH CONTRAST TECHNIQUE: Multidetector CT imaging of the chest, abdomen and pelvis was performed following the standard protocol during bolus administration of intravenous contrast. RADIATION DOSE REDUCTION: This exam was performed according to the departmental dose-optimization program which includes automated exposure control, adjustment of the mA and/or kV according to patient size and/or use of iterative reconstruction technique. CONTRAST:  OMNIPAQUE  IOHEXOL  300 MG/ML  SOLN COMPARISON:  Chest CT 11/05/2023 and PET-CT 04/30/2023. Whole-body bone scan 05/06/2024. FINDINGS: CT CHEST FINDINGS Cardiovascular: No acute vascular findings. Atherosclerosis  of the aorta, great vessels and coronary arteries. Stable mild dilatation of the distal thoracic arch. The heart size is normal. There is no pericardial effusion. Mediastinum/Nodes: There are no enlarged mediastinal, hilar or axillary lymph nodes. The thyroid  gland, trachea and esophagus demonstrate no significant findings. Lungs/Pleura: No pleural effusion or pneumothorax. Moderate centrilobular and paraseptal emphysema again noted. Previously demonstrated nodule medially at the left lung apex appears slightly smaller, measuring 8 x 5 mm on image 31/4 (previously 10 x 8 mm). This is partly obscured by adjacent pleural thickening. No confluent airspace disease or enlarging nodules identified. There is mild atelectasis along the superior aspect of the right major fissure. Musculoskeletal/Chest wall: No chest wall mass or suspicious osseous findings. Stable chronic mild anterior wedging at T12 with chronic interspinous widening at T11-12, suggesting remote injury. CT ABDOMEN AND PELVIS FINDINGS Hepatobiliary: The liver has a non cirrhotic morphology and demonstrates no suspicious focal abnormalities. Probable cyst medially in the right lobe measures 1.7 cm on image 58/2, unchanged. There are additional smaller lesions which are not well seen previously due to lack of contrast on prior studies, but are likely cysts. No evidence of gallstones, gallbladder wall thickening or biliary dilatation. Pancreas: Unremarkable. No pancreatic ductal dilatation or surrounding inflammatory changes. Spleen: Normal in size without focal abnormality. Adrenals/Urinary Tract: The adrenal glands appear stable with unchanged mild thickening of the left gland, without hypermetabolic activity on PET-CT. Small nonobstructing calculus in the interpolar region of the right kidney. No evidence of hydronephrosis or ureteral calculus. Stable small right renal cysts for which no specific follow-up imaging is recommended. The bladder appears normal  for its degree of distention. Stomach/Bowel: Enteric contrast has passed into the distal small bowel. Nonspecific diffuse gastric wall thickening, similar to prior studies. No evidence of bowel distension, wall thickening or surrounding inflammation. The appendix appears normal. Mildly prominent stool throughout the colon. Postsurgical changes in the proximal descending colon consistent with partial colon resection and anastomosis. Vascular/Lymphatic: There are no enlarged abdominal or pelvic lymph nodes. No acute vascular findings. Aortic and branch vessel atherosclerosis without evidence of aneurysm or large vessel occlusion. 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  Reproductive: The prostate gland and seminal vesicles appear unremarkable. Other: Small fat containing left inguinal hernia. No ascites, peritoneal nodularity or pneumoperitoneum. Musculoskeletal: No acute or significant osseous findings. Mild spondylosis. IMPRESSION: 1. Slight decrease in size of left apical pulmonary nodule consistent with response to therapy. 2. No evidence of metastatic disease in the chest, abdomen or pelvis. 3. Stable nonspecific thickening of the left adrenal gland without hypermetabolic activity on PET-CT. 4. Nonspecific diffuse gastric wall thickening, similar to prior studies. 5. Aortic Atherosclerosis (ICD10-I70.0) and Emphysema (ICD10-J43.9). Electronically Signed   By: Elsie Perone M.D.   On: 05/06/2024 15:58   NM Bone Scan Whole Body Result Date: 05/06/2024 CLINICAL DATA:  Metastatic disease evaluation. History of lung cancer. Recent falls. EXAM: NUCLEAR MEDICINE WHOLE BODY BONE SCAN TECHNIQUE: Whole body anterior and posterior images were obtained approximately 3 hours after intravenous injection of radiopharmaceutical. RADIOPHARMACEUTICALS:  21.83 mCi Technetium-83m MDP IV COMPARISON:  PET-CT 04/30/2023. CT of the chest, abdomen and pelvis 05/06/2024. FINDINGS: There is no osseous uptake suspicious for  metastatic disease. Scattered mild degenerative uptake is demonstrated in the shoulders, wrists, knees and feet. There is mild degenerative uptake in the spine. Soft tissue activity is within physiologic limits. IMPRESSION: No evidence of osseous metastatic disease. Electronically Signed   By: Elsie Perone M.D.   On: 05/06/2024 15:43   MR BRAIN W WO CONTRAST Result Date: 05/06/2024 EXAM: MRI BRAIN WITH AND WITHOUT CONTRAST 05/05/2024 09:15:52 PM TECHNIQUE: Multiplanar multisequence MRI of the head/brain was performed with and without the administration of 10 mL gadobutrol (GADAVIST) 1 MMOL/ML injection. COMPARISON: None available. CLINICAL HISTORY: Abnormal lumbar and thoracic leptomeningeal enhancement. Known cancer patient. FINDINGS: BRAIN AND VENTRICLES: No acute infarct. No acute intracranial hemorrhage. No midline shift. No hydrocephalus. The sella is unremarkable. Normal flow voids. There are circumscribed enhancing lesions present within the right frontal lobe and left cerebellar hemisphere, which are concerning for metastatic disease. The lesion within the right frontal lobe is seen on image 91 of series 20 and measures approximately 14 x 13 x 13 mm. The lesion within the left cerebellar hemisphere is seen on image 54, measuring approximately 17 x 15 x 14 mm. There is moderate vasogenic edema associated with the left cerebellar lesion but there is no significant mass effect. There is also vasogenic edema associated with the lesion in the right frontal lobe, also without significant mass effect. There is a focal area of gliosis in the periventricular and deep white matter of the left frontal lobe which is nonenhancing. There are also scattered foci of increased T2 signal throughout the cerebral white matter bilaterally. There is no abnormal leptomeningeal enhancement demonstrated. ORBITS: No acute abnormality. SINUSES: No acute abnormality. BONES AND SOFT TISSUES: Normal bone marrow signal and  enhancement. No acute soft tissue abnormality. IMPRESSION: 1. Circumscribed enhancing lesions in the right frontal lobe (14 x 13 x 13 mm) and left cerebellar hemisphere (17 x 15 x 14 mm), concerning for metastatic disease, with associated vasogenic edema but no significant mass effect. 2. No abnormal leptomeningeal enhancement. 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  Neurosurgery has been consulted.  Will plan for brain biopsy.  Further recommendations  per neurosurgery and oncology. Subacute onset intractable leg pain with enhancing lesions in the spine initially concerning for infection and was started on antibiotics but eventually discontinued after lumbar puncture results.  Please see oncology and neurology notes.  Pain is poorly controlled.  Patient is presently on Lyrica and will be continuing on Cymbalta.  Lidocaine infusion did not have much improvement.  Patient is also continued on morphine hydrocodone and Robaxin. Hypertension will continue ARB. COPD on Breo Ellipta and Singulair. Chronic HFrEF appears compensated.  Continue ARB.  Diuretics on hold. Hyperlipidemia on statins. GERD on PPI. BPH on Cardura.  Since patient has brain lesions concerning for metastasis will need biopsy and further management and more than 2 midnight stay.   DVT prophylaxis: SCDs. Code Status: Full code. Family Communication: Family at the bedside. Disposition Plan: Monitored bed. Consults called: Neurosurgery oncology and neurology were involved already. Admission status: Inpatient.

## 2024-05-07 NOTE — Plan of Care (Signed)
  Problem: Education: Goal: Knowledge of General Education information will improve Description: Including pain rating scale, medication(s)/side effects and non-pharmacologic comfort measures Outcome: Not Progressing   Problem: Health Behavior/Discharge Planning: Goal: Ability to manage health-related needs will improve Outcome: Not Progressing   Problem: Clinical Measurements: Goal: Ability to maintain clinical measurements within normal limits will improve Outcome: Not Progressing Goal: Will remain free from infection Outcome: Not Progressing Goal: Diagnostic test results will improve Outcome: Not Progressing Goal: Respiratory complications will improve Outcome: Not Progressing Goal: Cardiovascular complication will be avoided Outcome: Not Progressing   Problem: Elimination: Goal: Will not experience complications related to bowel motility Outcome: Not Progressing Goal: Will not experience complications related to urinary retention Outcome: Not Progressing

## 2024-05-08 ENCOUNTER — Inpatient Hospital Stay (HOSPITAL_COMMUNITY): Payer: Self-pay | Admitting: Anesthesiology

## 2024-05-08 ENCOUNTER — Encounter (HOSPITAL_COMMUNITY)
Admission: EM | Disposition: A | Discharge status: Rehabilitation Facility | Payer: Self-pay | Source: Ambulatory Visit | Attending: Internal Medicine

## 2024-05-08 ENCOUNTER — Inpatient Hospital Stay (HOSPITAL_COMMUNITY): Admission: RE | Admit: 2024-05-08 | Admitting: Neurosurgery

## 2024-05-08 ENCOUNTER — Encounter (HOSPITAL_COMMUNITY): Payer: Self-pay | Admitting: Internal Medicine

## 2024-05-08 DIAGNOSIS — I5022 Chronic systolic (congestive) heart failure: Secondary | ICD-10-CM

## 2024-05-08 DIAGNOSIS — D496 Neoplasm of unspecified behavior of brain: Secondary | ICD-10-CM | POA: Diagnosis present

## 2024-05-08 DIAGNOSIS — J449 Chronic obstructive pulmonary disease, unspecified: Secondary | ICD-10-CM

## 2024-05-08 DIAGNOSIS — N1831 Chronic kidney disease, stage 3a: Secondary | ICD-10-CM

## 2024-05-08 DIAGNOSIS — C7931 Secondary malignant neoplasm of brain: Principal | ICD-10-CM

## 2024-05-08 DIAGNOSIS — I130 Hypertensive heart and chronic kidney disease with heart failure and stage 1 through stage 4 chronic kidney disease, or unspecified chronic kidney disease: Secondary | ICD-10-CM

## 2024-05-08 LAB — POCT I-STAT 7, (LYTES, BLD GAS, ICA,H+H)
Acid-base deficit: 3 mmol/L — ABNORMAL HIGH (ref 0.0–2.0)
Bicarbonate: 21 mmol/L (ref 20.0–28.0)
Calcium, Ion: 1.22 mmol/L (ref 1.15–1.40)
HCT: 34 % — ABNORMAL LOW (ref 39.0–52.0)
Hemoglobin: 11.6 g/dL — ABNORMAL LOW (ref 13.0–17.0)
O2 Saturation: 100 %
Patient temperature: 36.4
Potassium: 4.2 mmol/L (ref 3.5–5.1)
Sodium: 131 mmol/L — ABNORMAL LOW (ref 135–145)
TCO2: 22 mmol/L (ref 22–32)
pCO2 arterial: 32.9 mmHg (ref 32–48)
pH, Arterial: 7.41 (ref 7.35–7.45)
pO2, Arterial: 167 mmHg — ABNORMAL HIGH (ref 83–108)

## 2024-05-08 LAB — PROTIME-INR
INR: 1.1 (ref 0.8–1.2)
Prothrombin Time: 14.4 s (ref 11.4–15.2)

## 2024-05-08 LAB — BASIC METABOLIC PANEL WITH GFR
Anion gap: 12 (ref 5–15)
BUN: 25 mg/dL — ABNORMAL HIGH (ref 8–23)
CO2: 23 mmol/L (ref 22–32)
Calcium: 9.4 mg/dL (ref 8.9–10.3)
Chloride: 97 mmol/L — ABNORMAL LOW (ref 98–111)
Creatinine, Ser: 1.19 mg/dL (ref 0.61–1.24)
GFR, Estimated: >60 mL/min
Glucose, Bld: 123 mg/dL — ABNORMAL HIGH (ref 70–99)
Potassium: 5.1 mmol/L (ref 3.5–5.1)
Sodium: 132 mmol/L — ABNORMAL LOW (ref 135–145)

## 2024-05-08 LAB — CBC
HCT: 40.3 % (ref 39.0–52.0)
Hemoglobin: 13.6 g/dL (ref 13.0–17.0)
MCH: 33.2 pg (ref 26.0–34.0)
MCHC: 33.7 g/dL (ref 30.0–36.0)
MCV: 98.3 fL (ref 80.0–100.0)
Platelets: 195 K/uL (ref 150–400)
RBC: 4.1 MIL/uL — ABNORMAL LOW (ref 4.22–5.81)
RDW: 12.7 % (ref 11.5–15.5)
WBC: 7.7 K/uL (ref 4.0–10.5)
nRBC: 0 % (ref 0.0–0.2)

## 2024-05-08 LAB — TYPE AND SCREEN
ABO/RH(D): A NEG
Antibody Screen: NEGATIVE

## 2024-05-08 LAB — MAGNESIUM: Magnesium: 2 mg/dL (ref 1.7–2.4)

## 2024-05-08 LAB — ABO/RH: ABO/RH(D): A NEG

## 2024-05-08 LAB — MRSA NEXT GEN BY PCR, NASAL: MRSA by PCR Next Gen: NOT DETECTED

## 2024-05-08 LAB — CYTOLOGY - NON PAP

## 2024-05-08 SURGERY — CRANIOTOMY TUMOR EXCISION
Anesthesia: General | Laterality: Right

## 2024-05-08 MED ORDER — SODIUM CHLORIDE 0.9 % IV SOLN: INTRAVENOUS | Status: DC

## 2024-05-08 MED ORDER — DEXAMETHASONE 4 MG PO TABS
4.0000 mg | ORAL_TABLET | Freq: Four times a day (QID) | ORAL | Status: AC
  Administered 2024-05-09 – 2024-05-10 (×2): 4 mg via ORAL
  Filled 2024-05-08 (×2): qty 1

## 2024-05-08 MED ORDER — ACETAMINOPHEN 650 MG RE SUPP: 650.0000 mg | RECTAL | Status: DC | PRN

## 2024-05-08 MED ORDER — VASOPRESSIN 20 UNIT/ML IV SOLN
INTRAVENOUS | Status: DC | PRN
  Administered 2024-05-08 (×4): 1 [IU] via INTRAVENOUS
  Administered 2024-05-08: 3 [IU] via INTRAVENOUS

## 2024-05-08 MED ORDER — PROMETHAZINE HCL 25 MG PO TABS: 12.5000 mg | ORAL_TABLET | ORAL | Status: DC | PRN

## 2024-05-08 MED ORDER — LIDOCAINE-EPINEPHRINE 1 %-1:100000 IJ SOLN
INTRAMUSCULAR | Status: AC
  Filled 2024-05-08: qty 1

## 2024-05-08 MED ORDER — SUGAMMADEX SODIUM 200 MG/2ML IV SOLN
INTRAVENOUS | Status: DC | PRN
  Administered 2024-05-08: 191.8 mg via INTRAVENOUS

## 2024-05-08 MED ORDER — VASOPRESSIN 20 UNIT/ML IV SOLN
INTRAVENOUS | Status: AC
  Filled 2024-05-08: qty 1

## 2024-05-08 MED ORDER — CHLORHEXIDINE GLUCONATE CLOTH 2 % EX PADS
6.0000 | MEDICATED_PAD | Freq: Every day | CUTANEOUS | Status: DC
  Administered 2024-05-08 – 2024-05-14 (×6): 6 via TOPICAL

## 2024-05-08 MED ORDER — ORAL CARE MOUTH RINSE: 15.0000 mL | Freq: Once | OROMUCOSAL | Status: AC

## 2024-05-08 MED ORDER — ACETAMINOPHEN 325 MG PO TABS: 650.0000 mg | ORAL_TABLET | ORAL | Status: DC | PRN

## 2024-05-08 MED ORDER — BACLOFEN 10 MG PO TABS
10.0000 mg | ORAL_TABLET | Freq: Two times a day (BID) | ORAL | Status: DC
  Administered 2024-05-08 – 2024-05-15 (×14): 10 mg via ORAL
  Filled 2024-05-08 (×15): qty 1

## 2024-05-08 MED ORDER — HEMOSTATIC AGENTS (NO CHARGE) OPTIME
TOPICAL | Status: DC | PRN
  Administered 2024-05-08: 1 via TOPICAL

## 2024-05-08 MED ORDER — CEFAZOLIN SODIUM-DEXTROSE 2-4 GM/100ML-% IV SOLN
2.0000 g | Freq: Once | INTRAVENOUS | Status: AC
  Administered 2024-05-08: 2 g via INTRAVENOUS
  Filled 2024-05-08: qty 100

## 2024-05-08 MED ORDER — DEXAMETHASONE SOD PHOSPHATE PF 10 MG/ML IJ SOLN
INTRAMUSCULAR | Status: DC | PRN
  Administered 2024-05-08: 10 mg via INTRAVENOUS

## 2024-05-08 MED ORDER — PHENYLEPHRINE 80 MCG/ML (10ML) SYRINGE FOR IV PUSH (FOR BLOOD PRESSURE SUPPORT)
PREFILLED_SYRINGE | INTRAVENOUS | Status: AC
  Filled 2024-05-08: qty 10

## 2024-05-08 MED ORDER — DEXAMETHASONE 4 MG PO TABS
6.0000 mg | ORAL_TABLET | Freq: Four times a day (QID) | ORAL | Status: AC
  Administered 2024-05-08 – 2024-05-09 (×3): 6 mg via ORAL
  Filled 2024-05-08 (×4): qty 2

## 2024-05-08 MED ORDER — CLEVIDIPINE BUTYRATE 0.5 MG/ML IV EMUL
INTRAVENOUS | Status: DC | PRN
  Administered 2024-05-08: 2 mg/h via INTRAVENOUS

## 2024-05-08 MED ORDER — POVIDONE-IODINE 5 % EX SOLN
CUTANEOUS | Status: DC | PRN
  Administered 2024-05-08: 1 via TOPICAL

## 2024-05-08 MED ORDER — LABETALOL HCL 5 MG/ML IV SOLN
10.0000 mg | INTRAVENOUS | Status: DC | PRN
  Administered 2024-05-08 – 2024-05-09 (×7): 20 mg via INTRAVENOUS
  Filled 2024-05-08 (×5): qty 4
  Filled 2024-05-08: qty 8
  Filled 2024-05-08: qty 4

## 2024-05-08 MED ORDER — ROCURONIUM BROMIDE 10 MG/ML (PF) SYRINGE
PREFILLED_SYRINGE | INTRAVENOUS | Status: AC
  Filled 2024-05-08: qty 10

## 2024-05-08 MED ORDER — LIDOCAINE 2% (20 MG/ML) 5 ML SYRINGE
INTRAMUSCULAR | Status: DC | PRN
  Administered 2024-05-08: 20 mg via INTRAVENOUS

## 2024-05-08 MED ORDER — CEFAZOLIN SODIUM-DEXTROSE 2-4 GM/100ML-% IV SOLN
2.0000 g | Freq: Three times a day (TID) | INTRAVENOUS | Status: AC
  Administered 2024-05-08 – 2024-05-09 (×3): 2 g via INTRAVENOUS
  Filled 2024-05-08 (×3): qty 100

## 2024-05-08 MED ORDER — ONDANSETRON HCL 4 MG/2ML IJ SOLN: 4.0000 mg | INTRAMUSCULAR | Status: DC | PRN

## 2024-05-08 MED ORDER — BACITRACIN ZINC 500 UNIT/GM EX OINT
TOPICAL_OINTMENT | CUTANEOUS | Status: DC | PRN
  Administered 2024-05-08: 1 via TOPICAL

## 2024-05-08 MED ORDER — BACITRACIN ZINC 500 UNIT/GM EX OINT
TOPICAL_OINTMENT | CUTANEOUS | Status: AC
  Filled 2024-05-08: qty 28.35

## 2024-05-08 MED ORDER — FENTANYL CITRATE (PF) 50 MCG/ML IJ SOSY: 25.0000 ug | PREFILLED_SYRINGE | INTRAMUSCULAR | Status: AC | PRN

## 2024-05-08 MED ORDER — PANTOPRAZOLE SODIUM 40 MG IV SOLR: 40.0000 mg | Freq: Every day | INTRAVENOUS | Status: DC

## 2024-05-08 MED ORDER — PHENYLEPHRINE HCL-NACL 20-0.9 MG/250ML-% IV SOLN
INTRAVENOUS | Status: DC | PRN
  Administered 2024-05-08: 100 ug/min via INTRAVENOUS
  Administered 2024-05-08: 200 ug/min via INTRAVENOUS

## 2024-05-08 MED ORDER — SURGIFLO WITH THROMBIN (HEMOSTATIC MATRIX KIT) OPTIME
TOPICAL | Status: DC | PRN
  Administered 2024-05-08: 1 via TOPICAL

## 2024-05-08 MED ORDER — CHLORHEXIDINE GLUCONATE 0.12 % MT SOLN
15.0000 mL | Freq: Once | OROMUCOSAL | Status: AC
  Administered 2024-05-08: 15 mL via OROMUCOSAL

## 2024-05-08 MED ORDER — LEVETIRACETAM (KEPPRA) 500 MG/5 ML ADULT IV PUSH
1000.0000 mg | Freq: Once | INTRAVENOUS | Status: AC
  Administered 2024-05-08: 1000 mg via INTRAVENOUS
  Filled 2024-05-08: qty 10

## 2024-05-08 MED ORDER — PROPOFOL 10 MG/ML IV BOLUS
INTRAVENOUS | Status: DC | PRN
  Administered 2024-05-08: 50 ug/kg/min via INTRAVENOUS
  Administered 2024-05-08: 100 mg via INTRAVENOUS

## 2024-05-08 MED ORDER — FENTANYL CITRATE (PF) 100 MCG/2ML IJ SOLN
INTRAMUSCULAR | Status: AC
  Filled 2024-05-08: qty 2

## 2024-05-08 MED ORDER — ROCURONIUM BROMIDE 10 MG/ML (PF) SYRINGE
PREFILLED_SYRINGE | INTRAVENOUS | Status: DC | PRN
  Administered 2024-05-08: 50 mg via INTRAVENOUS
  Administered 2024-05-08 (×2): 10 mg via INTRAVENOUS

## 2024-05-08 MED ORDER — ONDANSETRON HCL 4 MG PO TABS: 4.0000 mg | ORAL_TABLET | ORAL | Status: DC | PRN

## 2024-05-08 MED ORDER — ACETAMINOPHEN 10 MG/ML IV SOLN
INTRAVENOUS | Status: DC | PRN
  Administered 2024-05-08: 1000 mg via INTRAVENOUS

## 2024-05-08 MED ORDER — ONDANSETRON HCL 4 MG/2ML IJ SOLN
INTRAMUSCULAR | Status: DC | PRN
  Administered 2024-05-08: 4 mg via INTRAVENOUS

## 2024-05-08 MED ORDER — SODIUM CHLORIDE 0.9 % IV SOLN
0.3000 ug/kg/min | INTRAVENOUS | Status: AC
  Administered 2024-05-08: .3 ug/kg/min via INTRAVENOUS
  Filled 2024-05-08: qty 2000

## 2024-05-08 MED ORDER — LACTATED RINGERS IV SOLN: INTRAVENOUS | Status: DC | PRN

## 2024-05-08 MED ORDER — PHENYLEPHRINE 80 MCG/ML (10ML) SYRINGE FOR IV PUSH (FOR BLOOD PRESSURE SUPPORT)
PREFILLED_SYRINGE | INTRAVENOUS | Status: DC | PRN
  Administered 2024-05-08 (×3): 240 ug via INTRAVENOUS

## 2024-05-08 MED ORDER — BUTALBITAL-APAP-CAFFEINE 50-325-40 MG PO TABS
2.0000 | ORAL_TABLET | ORAL | Status: DC | PRN
  Administered 2024-05-08 – 2024-05-11 (×4): 2 via ORAL
  Filled 2024-05-08 (×4): qty 2

## 2024-05-08 MED ORDER — DEXAMETHASONE 4 MG PO TABS
4.0000 mg | ORAL_TABLET | Freq: Three times a day (TID) | ORAL | Status: DC
  Administered 2024-05-10 – 2024-05-15 (×16): 4 mg via ORAL
  Filled 2024-05-08 (×16): qty 1

## 2024-05-08 MED ORDER — 0.9 % SODIUM CHLORIDE (POUR BTL) OPTIME
TOPICAL | Status: DC | PRN
  Administered 2024-05-08 (×2): 1000 mL

## 2024-05-08 MED ORDER — LEVETIRACETAM (KEPPRA) 500 MG/5 ML ADULT IV PUSH
500.0000 mg | Freq: Two times a day (BID) | INTRAVENOUS | Status: DC
  Administered 2024-05-08 – 2024-05-09 (×2): 500 mg via INTRAVENOUS
  Filled 2024-05-08 (×2): qty 5

## 2024-05-08 MED ORDER — LIDOCAINE-EPINEPHRINE 1 %-1:100000 IJ SOLN
INTRAMUSCULAR | Status: DC | PRN
  Administered 2024-05-08: 8 mL

## 2024-05-08 MED ORDER — LIDOCAINE 2% (20 MG/ML) 5 ML SYRINGE
INTRAMUSCULAR | Status: AC
  Filled 2024-05-08: qty 5

## 2024-05-08 MED ORDER — IPRATROPIUM-ALBUTEROL 0.5-2.5 (3) MG/3ML IN SOLN
3.0000 mL | Freq: Once | RESPIRATORY_TRACT | Status: AC
  Administered 2024-05-08: 3 mL via RESPIRATORY_TRACT
  Filled 2024-05-08: qty 3

## 2024-05-08 SURGICAL SUPPLY — 66 items
BAG COUNTER SPONGE SURGICOUNT (BAG) ×1 IMPLANT
BUR ACORN 6.0 PRECISION (BURR) ×1 IMPLANT
BUR MATCHSTICK NEURO 3.0 LAGG (BURR) IMPLANT
BUR SPIRAL ROUTER 2.3 (BUR) ×1 IMPLANT
CANISTER SUCTION 3000ML PPV (SUCTIONS) ×1 IMPLANT
CASSETTE SUCT IRRIG SONOPET IQ (MISCELLANEOUS) IMPLANT
CHLORAPREP W/TINT 26 (MISCELLANEOUS) ×1 IMPLANT
CLIP TI MEDIUM 6 (CLIP) IMPLANT
CNTNR URN SCR LID CUP LEK RST (MISCELLANEOUS) ×1 IMPLANT
COMB HAIR BLACK 7 SU STRL (MISCELLANEOUS) ×1 IMPLANT
COVER MAYO STAND STRL (DRAPES) ×3 IMPLANT
DRAIN WOUND RND W/TROCAR (DRAIN) IMPLANT
DRAPE MICROSCOPE LEICA (MISCELLANEOUS) ×1 IMPLANT
DRAPE NEUROLOGICAL W/INCISE (DRAPES) ×1 IMPLANT
DRAPE UTILITY 15X26 TOWEL STRL (DRAPES) ×2 IMPLANT
DRAPE WARM FLUID 44X44 (DRAPES) ×1 IMPLANT
DRSG TEGADERM 4X4.75 (GAUZE/BANDAGES/DRESSINGS) ×1 IMPLANT
DRSG TELFA 3X8 NADH STRL (GAUZE/BANDAGES/DRESSINGS) ×2 IMPLANT
ELECTRODE REM PT RTRN 9FT ADLT (ELECTROSURGICAL) ×1 IMPLANT
EVACUATOR SILICONE 100CC (DRAIN) IMPLANT
FEE COVERAGE SUPPORT O-ARM (MISCELLANEOUS) ×1 IMPLANT
FORCEPS BIPOLAR SPETZLER 8 1.0 (NEUROSURGERY SUPPLIES) ×1 IMPLANT
GAUZE 4X4 16PLY ~~LOC~~+RFID DBL (SPONGE) IMPLANT
GAUZE PAD ABD 8X10 STRL (GAUZE/BANDAGES/DRESSINGS) IMPLANT
GLOVE BIOGEL M SZ8.5 STRL (GLOVE) ×2 IMPLANT
GOWN STRL REUS W/ TWL LRG LVL3 (GOWN DISPOSABLE) IMPLANT
GOWN STRL REUS W/ TWL XL LVL3 (GOWN DISPOSABLE) IMPLANT
GOWN STRL SURGICAL XL XLNG (GOWN DISPOSABLE) ×1 IMPLANT
GRAFT DURAGEN MATRIX 2WX2L IMPLANT
HEMOSTAT SURGICEL 2X14 (HEMOSTASIS) ×1 IMPLANT
KIT BASIN OR (CUSTOM PROCEDURE TRAY) ×1 IMPLANT
KIT TURNOVER KIT B (KITS) ×1 IMPLANT
MARKER SKIN DUAL TIP RULER LAB (MISCELLANEOUS) IMPLANT
MARKER SPHERE PSV REFLC NDI (MISCELLANEOUS) ×3 IMPLANT
NDL HYPO 25X1 1.5 SAFETY (NEEDLE) ×1 IMPLANT
NEEDLE HYPO 25X1 1.5 SAFETY (NEEDLE) ×1 IMPLANT
PACK CRANIOTOMY CUSTOM (CUSTOM PROCEDURE TRAY) ×1 IMPLANT
PATTIES SURGICAL .25X.25 (GAUZE/BANDAGES/DRESSINGS) ×1 IMPLANT
PATTIES SURGICAL .5 X3 (DISPOSABLE) IMPLANT
PENCIL BUTTON HOLSTER BLD 10FT (ELECTRODE) ×1 IMPLANT
PLATE BONE 12 2H TARGET XL (Plate) IMPLANT
POINTER NAVIGATION AXIEM (INSTRUMENTS) ×1 IMPLANT
POINTER TRACER AXIEM (INSTRUMENTS) IMPLANT
SCREW UNIII AXS SD 1.5X4 (Screw) IMPLANT
SOL PREP POV-IOD 4OZ 10% (MISCELLANEOUS) ×1 IMPLANT
SOLN 0.9% NACL 1000 ML (IV SOLUTION) ×3 IMPLANT
SOLN 0.9% NACL POUR BTL 1000ML (IV SOLUTION) ×1 IMPLANT
SOLN STERILE WATER 1000 ML (IV SOLUTION) ×1 IMPLANT
SOLN STERILE WATER BTL 1000 ML (IV SOLUTION) ×1 IMPLANT
SPIKE FLUID TRANSFER (MISCELLANEOUS) ×1 IMPLANT
SPONGE NEURO XRAY DETECT 1X3 (DISPOSABLE) IMPLANT
STAPLER SKIN PROX 35W (STAPLE) ×1 IMPLANT
SURGIFLO W/THROMBIN 8M KIT (HEMOSTASIS) IMPLANT
SUT NURALON 4 0 TR CR/8 (SUTURE) ×1 IMPLANT
SUT PROLENE 1X30 V-34 (SUTURE) IMPLANT
SUT PROLENE 5 0 RB 2 (SUTURE) IMPLANT
SUT VIC AB 0 CT2 8-18 (SUTURE) ×2 IMPLANT
SYR 30ML SLIP (SYRINGE) ×1 IMPLANT
TIP SONOPET IQ 12 BARRACUDA (TIP) IMPLANT
TOWEL GREEN STERILE (TOWEL DISPOSABLE) ×1 IMPLANT
TOWEL GREEN STERILE FF (TOWEL DISPOSABLE) ×1 IMPLANT
TRACKER ENT PATIENT (MISCELLANEOUS) IMPLANT
TRAY FOLEY MTR SLVR 14FR STAT (SET/KITS/TRAYS/PACK) IMPLANT
TRAY FOLEY MTR SLVR 16FR STAT (SET/KITS/TRAYS/PACK) IMPLANT
TUBE CONNECTING 20X1/4 (TUBING) IMPLANT
TUBING FEATHERFLOW (TUBING) IMPLANT

## 2024-05-08 NOTE — Op Note (Signed)
 DATE OF SURGERY: 05/08/2024   ATTENDING SURGEON: Dino Sable, MD   ASSISTANT: None   PREOPERATIVE DIAGNOSIS: Brain tumor   POSTOPERATIVE DIAGNOSIS: Same    PROCEDURE PERFORMED:  1. RIGHT craniotomy for resection of brain tumor 2. Use of neuro navigation for assessment of extent of resection and localization 3. Duraplasty greater than 5 cm 4. Use of microscope for microscopic dissection 5. Cranioplasty >5cm  ANESTHESIA: General endotracheal anesthesia.     ESTIMATED BLOOD LOSS, URINE OUTPUT, AND CRYSTALLOIDS:   See anesthesia chart.     COMPLICATIONS: None.     SPECIMENS: Tumor   DRAINS: JP    PREOPERATIVE COURSE:   Patient is an 68 year old gentleman who presented to our service and was found to have a right sided tumor.  Options were discussed with the patient and family of doing nothing versus radiation versus surgery followed by radiation and they opted for the latter.  Risks discussed included but not limited to infection, hemorrhage stroke paralysis blindness speech impairment, seizures DVT PE cardiopulmonary complications and death amongst others.  All the questions were answered to the satisfaction as was per them and they requested for us  to proceed.   DESCRIPTION OF PROCEDURE: Patient brought to the operating room after general endotracheal anesthesia was ensued patient placed in the supine position with the head on a horseshoe head holder.  Registration was obtained after which the tumor was outlined on the scalp and a curvilinear incision was outlined.  Hereafter the patient was prepped and draped in usual sterile fashion and the outline of the incision was injected with lidocaine with epinephrine.  Then, using a #10 blade an incision was made and retractors were placed.  With navigation the tumor was outlined on the skull and 2 bur holes were made, craniotomy performed and the bone was saved in a soaked sponge.  Copious irrigation was performed hemostasis  obtained after which the dura was opened in a cruciate fashion.  The microscope was then brought in, correct entry point identified with navigation and small corticectomy was performed and the tumor was then identified.  This was then biopsied and sent for frozen section and internal debulking was performed.  Internal debulking was performed.  Here after hemostasis was obtained copious irrigation was performed after which the cavity was lined with Surgicel.  With navigation resection was confirmed.  Here after copious irrigation was performed and duraplasty performed after which the microscope was taken out and the bone was replaced with titanium plates and screws and cranioplasty performed.  After hemostasis and irrigation a subgaleal drain was left in situ and the galea and skin were closed in separate layers.   At the end of the procedure all counts were complete.     It should be noted that Stereotactic computer-assisted navigation was indicated due to patient's anatomy. Films were reviewed for preop planning. Registration was personally performed by the me. Visual confirmation was done. Navigation was utilized during different portions of the surgery, including on entry to the brain and localization of the tumor.

## 2024-05-08 NOTE — Anesthesia Procedure Notes (Signed)
 Procedure Name: Intubation Date/Time: 05/08/2024 8:01 AM  Performed by: Harrold Macintosh, CRNAPre-anesthesia Checklist: Patient identified, Emergency Drugs available, Suction available and Patient being monitored Patient Re-evaluated:Patient Re-evaluated prior to induction Oxygen  Delivery Method: Circle system utilized Preoxygenation: Pre-oxygenation with 100% oxygen  Induction Type: IV induction Ventilation: Mask ventilation without difficulty Laryngoscope Size: Miller and 2 Grade View: Grade II Tube type: Oral Tube size: 7.5 mm Number of attempts: 1 Airway Equipment and Method: Stylet and Bite block Placement Confirmation: ETT inserted through vocal cords under direct vision, positive ETCO2 and breath sounds checked- equal and bilateral Secured at: 23 cm Tube secured with: Tape Dental Injury: Teeth and Oropharynx as per pre-operative assessment

## 2024-05-08 NOTE — Anesthesia Postprocedure Evaluation (Signed)
 Anesthesia Post Note  Patient: Roger Black  Procedure(s) Performed: CRANIOTOMY TUMOR EXCISION (Right) COMPUTER-ASSISTED NAVIGATION, FOR CRANIAL PROCEDURE (Right)     Patient location during evaluation: PACU Anesthesia Type: General Level of consciousness: awake and confused Pain management: pain level controlled Vital Signs Assessment: post-procedure vital signs reviewed and stable Respiratory status: spontaneous breathing, nonlabored ventilation, respiratory function stable and patient connected to nasal cannula oxygen  Cardiovascular status: blood pressure returned to baseline and stable Postop Assessment: no apparent nausea or vomiting Anesthetic complications: no   No notable events documented.  Last Vitals:  Vitals:   05/08/24 1045 05/08/24 1110  BP: 114/70 117/78  Pulse: (!) 102 (!) 104  Resp: 18 (!) 22  Temp:  36.6 C  SpO2: 94% 94%    Last Pain:  Vitals:   05/08/24 1110  TempSrc: Axillary  PainSc:                  Roger Black,W. EDMOND

## 2024-05-08 NOTE — Anesthesia Procedure Notes (Addendum)
 Arterial Line Insertion Start/End10/16/2025 7:00 AM, 05/08/2024 7:12 AM Performed by: Harrold Macintosh, CRNA, CRNA  Patient location: Pre-op. Preanesthetic checklist: patient identified, IV checked, site marked, risks and benefits discussed, surgical consent, monitors and equipment checked, pre-op evaluation, timeout performed and anesthesia consent Lidocaine 1% used for infiltration Left, radial was placed Catheter size: 20 G Hand hygiene performed  and maximum sterile barriers used  Allen's test indicative of satisfactory collateral circulation Attempts: 1 Procedure performed using ultrasound to evaluate access site. Ultrasound Notes:relevant anatomy identified, ultrasound used to visualize needle entry, vessel patent under ultrasound and image(s) printed for medical record. Following insertion, Biopatch and dressing applied. Post procedure assessment: normal and unchanged  Patient tolerated the procedure well with no immediate complications.

## 2024-05-08 NOTE — Progress Notes (Signed)
 Called MD [name] to notifiy him of the patient pulling his JP drain out. Will cover with gauze and tape at this time.

## 2024-05-08 NOTE — Transfer of Care (Signed)
 Immediate Anesthesia Transfer of Care Note  Patient: Roger Black  Procedure(s) Performed: CRANIOTOMY TUMOR EXCISION (Right) COMPUTER-ASSISTED NAVIGATION, FOR CRANIAL PROCEDURE (Right)  Patient Location: PACU  Anesthesia Type:General  Level of Consciousness: awake, alert , and confused  Airway & Oxygen  Therapy: Patient Spontanous Breathing and Patient connected to face mask oxygen   Post-op Assessment: Report given to RN, Post -op Vital signs reviewed and stable, Patient moving all extremities X 4, and Patient able to stick tongue midline  Post vital signs: Reviewed and stable  Last Vitals:  Vitals Value Taken Time  BP 145/101 05/08/24 09:52  Temp 98.6   Pulse 102 05/08/24 09:56  Resp 15 05/08/24 09:56  SpO2 90 % 05/08/24 09:56  Vitals shown include unfiled device data.  Last Pain:  Vitals:   05/08/24 0507  TempSrc:   PainSc: Asleep      Patients Stated Pain Goal: 2 (05/07/24 1832)  Complications: No notable events documented.

## 2024-05-08 NOTE — H&P (Signed)
 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   vertex scalp, treating with 5FU/calcipotriene   Psh Social History   Socioeconomic History   Marital status: Married    Spouse name: Not on file   Number of children: Not on file   Years of education: Not on file   Highest education level: Not on file  Occupational History   Not on file  Tobacco Use   Smoking status: Former    Current packs/day: 1.00    Average packs/day: 1 pack/day for 47.0 years (47.0 ttl pk-yrs)    Types: Cigarettes   Smokeless tobacco: Never  Vaping Use   Vaping status: Never Used  Substance and Sexual Activity   Alcohol  use: Never   Drug use: Never   Sexual activity: Not on file  Other Topics Concern   Not on file  Social History Narrative   Not on file   Social Drivers of Health   Financial Resource Strain: Low Risk  (03/07/2024)   Received from [facility]   Overall Financial Resource Strain (CARDIA)    Difficulty of Paying Living Expenses: Not hard at all  Recent Concern: Financial Resource Strain - High Risk (01/29/2024)   Received from [facility]   Overall Financial Resource Strain (CARDIA)    Difficulty of Paying Living Expenses: Hard  Food Insecurity: No Food Insecurity (05/07/2024)   Hunger Vital Sign    Worried About Running Out of Food in the Last Year: Never true    Ran Out of  Food in the Last Year: Never true  Transportation Needs: No Transportation Needs (05/07/2024)   PRAPARE - Administrator, Civil Service (Medical): No    Lack of Transportation (Non-Medical): No  Physical Activity: Inactive (08/07/2023)   Received from [facility]   Exercise Vital Sign    On average, how many days per week do you engage in moderate to strenuous exercise (like a brisk walk)?: 0 days    On average, how many minutes do you engage in exercise at this level?: 0 min  Stress: Stress Concern Present (08/07/2023)   Received from [facility] Occupational [facility] - Occupational Stress Questionnaire    Feeling of Stress : Rather much  Social Connections: Unknown (05/07/2024)   Social Connection and Isolation Panel    Frequency of Communication with Friends and Family: Three times a week    Frequency of Social Gatherings with Friends and Family: Three times a week    Attends Religious Services: Patient declined    Active Member of Clubs or Organizations: Patient declined    Attends Banker Meetings: Patient declined    Marital Status: Married  Catering manager Violence: Not At Risk (04/29/2024)   Humiliation, Afraid, Rape, and Kick questionnaire    Fear  of Current or Ex-Partner: No    Emotionally Abused: No    Physically Abused: No    Sexually Abused: No   Family History  Problem Relation Age of Onset   Hypertension Mother    Hypertension Father    No Known Allergies Scheduled Meds:  [MAR Hold] atorvastatin  80 mg Oral Daily   [MAR Hold] doxazosin  2 mg Oral QHS   [MAR Hold] DULoxetine  60 mg Oral Daily   [MAR Hold] fluticasone furoate-vilanterol  1 puff Inhalation Daily   [MAR Hold] irbesartan  300 mg Oral Daily   [MAR Hold] methocarbamol  500 mg Oral Q6H   [MAR Hold] montelukast  10 mg Oral Daily   [MAR Hold] oxyCODONE-acetaminophen  1 tablet Oral Q6H   [MAR Hold] pantoprazole  40 mg Oral  Daily   [MAR Hold] polyethylene glycol  17 g Oral Daily   [MAR Hold] pregabalin  150 mg Oral TID   Continuous Infusions: PRN Meds:.[MAR Hold] acetaminophen **OR** [MAR Hold] acetaminophen, [MAR Hold] morphine (PF), [MAR Hold] zolpidem Vitals:   05/07/24 2311 05/08/24 0357  BP: 123/80 115/79  Pulse: 93 (!) 103  Resp: 18 18  Temp: 98 F (36.7 C) 98 F (36.7 C)  SpO2: 91% 91%   Physical Exam HENT:     Head: Normocephalic.     Nose: Nose normal.  Eyes:     Pupils: Pupils are equal, round, and reactive to light.  Cardiovascular:     Rate and Rhythm: Normal rate.  Pulmonary:     Effort: Pulmonary effort is normal.  Abdominal:     General: Abdomen is flat.  Musculoskeletal:     Cervical back: Normal range of motion.  Neurological:     Mental Status: He is alert.   ASSESSMENT: RIGHT BRAIN TUMOR  PLAN: RIGHT CRANIOTOMY FOR TUMOR

## 2024-05-08 NOTE — Consult Note (Signed)
 NAME:  Roger Black, MRN:  [id number], DOB:  [birth year], LOS: 1 ADMISSION DATE:  05/07/2024, CONSULTATION DATE:  05/08/2024 REFERRING MD:  CLANCY, CHIEF COMPLAINT:  Post op medical management    History of Present Illness:  Roger Black is a 68 year old male with a past medical history significant for HFrEF, lung cancer s/p radiation December 2024, COPD, CKD, and HTN who initially presented to [facility] 04/28/24 for complaints of leg pain.  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  Patient was discharged from [facility] to [facility] for neurosurgery evaluation and management  Patient underwent right craniotomy for brain tumor evaluation per Dr. [name] on 10/16, postoperatively patient was admitted to ICU with PCCM consulted for medical management.  Pertinent  Medical History  HFrEF, lung cancer s/p radiation December 2024, COPD, CKD, and HTN   Significant Hospital Events: Including procedures, antibiotic start and stop dates in addition to other pertinent events   10/06 initially presented to [facility] for intractable  leg pain MRI lumbar spine revealed multilevel stenosis with enhancement of several nerve roots of the cauda equina without nodular masslike enhancements 10/7 neurosurgery evaluated with recommendations of LP, interventional radiology performed LP with approximately 20 mL clear colorless fluid obtained.  Opening pressure 22 closing pressure 9 10/8 meningitis/encephalitis panel negative Acyclovir and ampicillin discontinued 10/9 LP repeated per neurology recommendations, flow cytometry and cytology with cellblock preparation recommended 10/13 MRI brain completed and revealed circumscribed enhancing lesion in the right frontal lobe measuring 14 x 13 x 13 and cerebellar hemisphere 17 x 15 x 14 concerning for metastatic disease associated with vasogenic edema 10/15  transferred to [facility] for neurosurgery evaluation 10/16 underwent right craniotomy for resection of brain tumor per Dr. [name]  Interim History / Subjective:  Sleepy postop but denies any acute pain  Objective    Blood pressure 121/76, pulse (!) 103, temperature (!) 97.4 F (36.3 C), resp. rate 16, height 5' 9 (1.753 m), SpO2 93%.        Intake/Output Summary (Last 24 hours) at 05/08/2024 1034 Last data filed at 05/08/2024 0946 Gross per 24 hour  Intake 1450 ml  Output 190 ml  Net 1260 ml   There were no vitals filed for this visit.  Examination: General: Acute on chronic ill-appearing middle-age male lying in bed in no acute distress HEENT: [place]/AT, MM pink/moist, PERRL,  Neuro: Sleepy but easily arousable, nonfocal CV: s1s2 regular rate and rhythm, no murmur, rubs, or gallops,  PULM: Clear to auscultation bilaterally, no increased work of breathing, no added breath sounds, on room air GI: soft, bowel sounds active in all 4 quadrants, non-tender, non-distended, tolerating oral diet Extremities: warm/dry, no edema  Skin: no rashes or lesions  Resolved problem list   Assessment and Plan  Brain tumor now s/p right craniotomy for resection of tumor -MRI brain completed and revealed circumscribed enhancing lesion in the right frontal lobe measuring 14 x 13 x 13 and cerebellar hemisphere 17 x 15 x 14 concerning for metastatic disease associated with vasogenic edema P: Primary management per Dr. [name] Ensure adequate pain control but avoid opioids as able SBP goal less than 150 Neuroprotective measures Aspiration precautions  Subacute onset intractable leg pain with enhancing lesions -with enhancing lesions in the spine initially concerning for infection and was started on antibiotics but eventually discontinued after lumbar puncture results.  P: Pain is currently well controlled post op  Continue PRN Tylenol, PRN Foricet, Decadron, PRN Fentanyl Sop PRN Morphine and  Oxxy in favor of Fentanyl for now  Continue Lyrica   History of COPD P: Supplemental oxygen  as needed  PRN BDs   History  of CAD Essential hypertension Hyperlipidemia  HFrEF -Most recent echo August 2025 with EF of 45%  P: Strict intake and output  Monitor for signs of volume overload  Goal of normovolemia  Continue home ARB SBP goal less than 150  CKD stage IIIa -Baseline creatinine ranges upper 50;s to 60's P: Follow renal function  Monitor urine output Trend Bmet Avoid nephrotoxins Ensure adequate renal perfusion   Labs   CBC: Recent Labs  Lab 05/02/24 0439 05/07/24 0555 05/08/24 0133 05/08/24 0856  WBC 6.6 7.3 7.7  --   NEUTROABS 4.5 5.0  --   --   HGB 12.1* 13.6 13.6 11.6*  HCT 35.9* 40.3 40.3 34.0*  MCV 97.8 97.3 98.3  --   PLT 161 180 195  --     Basic Metabolic Panel: Recent Labs  Lab 05/02/24 0439 05/07/24 0555 05/08/24 0133 05/08/24 0856  NA 132* 132* 132* 131*  K 4.5 4.9 5.1 4.2  CL 99 97* 97*  --   CO2 24 25 23   --   GLUCOSE 104* 124* 123*  --   BUN 24* 25* 25*  --   CREATININE 1.12 1.31* 1.19  --   CALCIUM 9.2 9.5 9.4  --   MG 1.9 2.0 2.0  --    GFR: Estimated Creatinine Clearance: 67.9 mL/min (by C-G formula based on SCr of 1.19 mg/dL). Recent Labs  Lab 05/02/24 0439 05/07/24 0555 05/08/24 0133  WBC 6.6 7.3 7.7    Liver Function Tests: Recent Labs  Lab 05/02/24 0439 05/07/24 0555  AST 19 21  ALT 16 19  ALKPHOS 59 73  BILITOT 0.8 0.9  PROT 6.6 6.8  ALBUMIN 3.8 3.8   No results for input(s): LIPASE, AMYLASE in the last 168 hours. No results for input(s): AMMONIA in the last 168 hours.  ABG    Component Value Date/Time   PHART 7.410 05/08/2024 0856   PCO2ART 32.9 05/08/2024 0856   PO2ART 167 (H) 05/08/2024 0856   HCO3 21.0 05/08/2024 0856   TCO2 22 05/08/2024 0856   ACIDBASEDEF 3.0 (H) 05/08/2024 0856   O2SAT 100 05/08/2024 0856     Coagulation Profile: Recent Labs  Lab 05/07/24 0555 05/08/24 0133   INR 1.0 1.1    Cardiac Enzymes: No results for input(s): CKTOTAL, CKMB, CKMBINDEX, TROPONINI in the last 168 hours.  HbA1C: No results found for: HGBA1C  CBG: No results for input(s): GLUCAP in the last 168 hours.  Review of Systems:   Please see the history of present illness. All other systems reviewed and are negative   Past Medical History:  He,  has a past medical history of Actinic keratosis, Aortic atherosclerosis, Arthritis, Asthma, Chronic kidney disease, stage 3a (HCC), Chronic systolic CHF (congestive heart failure) (HCC), COPD (chronic obstructive pulmonary disease) (HCC), Coronary artery disease, HTN (hypertension), Hyperlipidemia, Personal history of tobacco use, presenting hazards to health (09/21/2015), Sleep apnea, Squamous cell carcinoma in situ (SCCIS) (10/18/2023), and Squamous cell carcinoma in situ (SCCIS) (10/18/2023).   Surgical History:   Past Surgical History:  Procedure Laterality Date   COLON SURGERY     COLONOSCOPY WITH PROPOFOL  N/A 10/20/2020   Procedure: COLONOSCOPY WITH PROPOFOL ;  Surgeon: Toledo, Ladell POUR, MD;  Location: [facility] ENDOSCOPY;  Service: Gastroenterology;  Laterality: N/A;  Social History:   reports that he has quit smoking. His smoking use included cigarettes. He has a 47 pack-year smoking history. He has never used smokeless tobacco. He reports that he does not drink alcohol  and does not use drugs.   Family History:  His family history includes Hypertension in his father and mother.   Allergies No Known Allergies   Home Medications  Prior to Admission medications   Medication Sig Start Date End Date Taking? Authorizing Provider  acetaminophen (TYLENOL) 325 MG tablet Take 2 tablets (650 mg total) by mouth every 6 (six) hours as needed for mild pain (pain score 1-3) or fever. 05/06/24  Yes [name], MD  albuterol (VENTOLIN HFA) 108 (90 Base) MCG/ACT inhaler Inhale 2 puffs into the lungs every 4 (four) hours  as needed for shortness of breath. 06/07/20  Yes [provider]  aspirin 81 MG EC tablet Take 81 mg by mouth daily.   Yes [provider]  atorvastatin (LIPITOR) 80 MG tablet Take 80 mg by mouth daily. 08/19/19 05/07/24 Yes [provider]  budesonide-formoterol (SYMBICORT) 160-4.5 MCG/ACT inhaler Inhale 2 puffs into the lungs 2 (two) times daily.   Yes [provider]  doxazosin (CARDURA) 2 MG tablet Take 2 mg by mouth at bedtime.   Yes [provider]  DULoxetine (CYMBALTA) 60 MG capsule Take 1 capsule (60 mg total) by mouth daily. 05/07/24  Yes [name], MD  HYDROcodone-acetaminophen (NORCO/VICODIN) 5-325 MG tablet Take 1 tablet by mouth 2 (two) times daily as needed for moderate pain (pain score 4-6). 04/23/24  Yes [provider]  methocarbamol (ROBAXIN) 500 MG tablet Take 1 tablet (500 mg total) by mouth 4 (four) times daily. 05/06/24  Yes [name], MD  montelukast (SINGULAIR) 10 MG tablet Take 10 mg by mouth daily. 06/03/20  Yes [provider]  olmesartan (BENICAR) 40 MG tablet Take 40 mg by mouth daily.   Yes [provider]  ondansetron (ZOFRAN) 4 MG/2ML SOLN injection Inject 2 mLs (4 mg total) into the vein every 8 (eight) hours as needed for nausea or vomiting. 05/06/24  Yes [name], MD  oxyCODONE-acetaminophen (PERCOCET/ROXICET) 5-325 MG tablet Take 1 tablet by mouth every 6 (six) hours. 05/06/24  Yes [name], MD  pantoprazole (PROTONIX) 40 MG tablet Take 40 mg by mouth daily. 12/03/19 05/07/24 Yes [provider]  pregabalin (LYRICA) 150 MG capsule Take 1 capsule (150 mg total) by mouth 3 (three) times daily. 05/06/24  Yes [name], MD  spironolactone (ALDACTONE) 50 MG tablet Take 50 mg by mouth daily. 02/28/24 02/27/25 Yes [provider]  torsemide (DEMADEX) 100 MG tablet Take 100 mg by mouth daily as needed (edema). 02/28/24 02/27/25 Yes [provider]  zolpidem (AMBIEN) 5 MG tablet Take 1 tablet (5 mg total) by mouth at bedtime as needed for sleep. 05/06/24  Yes [name], MD  ipratropium (ATROVENT) 0.03 % nasal spray Place into the nose. 12/26/22 04/28/24  [provider]  Morphine Sulfate (MORPHINE, PF,) 2 MG/ML injection Inject 1 mL (2 mg total) into the vein every 4 (four) hours as needed (breakthrough pain). Patient not taking: Reported on 05/07/2024 05/06/24   [name], MD     Critical care time: NA   Jonuel Butterfield D. Harris, NP-C [facility] Personal contact information can be found on Amion  If no contact or response made please call [phone] 05/08/2024, 12:07 PM

## 2024-05-09 ENCOUNTER — Encounter (HOSPITAL_COMMUNITY): Payer: Self-pay | Admitting: Neurosurgery

## 2024-05-09 ENCOUNTER — Inpatient Hospital Stay (HOSPITAL_COMMUNITY)

## 2024-05-09 DIAGNOSIS — R220 Localized swelling, mass and lump, head: Secondary | ICD-10-CM | POA: Diagnosis not present

## 2024-05-09 DIAGNOSIS — G9389 Other specified disorders of brain: Secondary | ICD-10-CM | POA: Diagnosis not present

## 2024-05-09 DIAGNOSIS — D496 Neoplasm of unspecified behavior of brain: Secondary | ICD-10-CM | POA: Diagnosis not present

## 2024-05-09 DIAGNOSIS — N189 Chronic kidney disease, unspecified: Secondary | ICD-10-CM

## 2024-05-09 LAB — CBC
HCT: 38.6 % — ABNORMAL LOW (ref 39.0–52.0)
Hemoglobin: 12.9 g/dL — ABNORMAL LOW (ref 13.0–17.0)
MCH: 32.6 pg (ref 26.0–34.0)
MCHC: 33.4 g/dL (ref 30.0–36.0)
MCV: 97.5 fL (ref 80.0–100.0)
Platelets: 176 K/uL (ref 150–400)
RBC: 3.96 MIL/uL — ABNORMAL LOW (ref 4.22–5.81)
RDW: 12.7 % (ref 11.5–15.5)
WBC: 10 K/uL (ref 4.0–10.5)
nRBC: 0 % (ref 0.0–0.2)

## 2024-05-09 LAB — BASIC METABOLIC PANEL WITH GFR
Anion gap: 13 (ref 5–15)
BUN: 21 mg/dL (ref 8–23)
CO2: 22 mmol/L (ref 22–32)
Calcium: 9.5 mg/dL (ref 8.9–10.3)
Chloride: 99 mmol/L (ref 98–111)
Creatinine, Ser: 1.06 mg/dL (ref 0.61–1.24)
GFR, Estimated: >60 mL/min (ref >60)
Glucose, Bld: 136 mg/dL — ABNORMAL HIGH (ref 70–99)
Potassium: 4.6 mmol/L (ref 3.5–5.1)
Sodium: 134 mmol/L — ABNORMAL LOW (ref 135–145)

## 2024-05-09 MED ORDER — SENNOSIDES-DOCUSATE SODIUM 8.6-50 MG PO TABS
2.0000 | ORAL_TABLET | Freq: Two times a day (BID) | ORAL | Status: DC
Start: 2024-05-09 — End: 2024-05-15
  Administered 2024-05-11 – 2024-05-15 (×7): 2 via ORAL
  Filled 2024-05-09 (×7): qty 2

## 2024-05-09 MED ORDER — HALOPERIDOL LACTATE 5 MG/ML IJ SOLN: 1.0000 mg | Freq: Once | INTRAMUSCULAR | Status: DC

## 2024-05-09 MED ORDER — HALOPERIDOL LACTATE 5 MG/ML IJ SOLN
INTRAMUSCULAR | Status: AC
  Filled 2024-05-09: qty 1

## 2024-05-09 MED ORDER — HALOPERIDOL LACTATE 5 MG/ML IJ SOLN
5.0000 mg | Freq: Once | INTRAMUSCULAR | Status: AC
  Administered 2024-05-09: 5 mg via INTRAVENOUS

## 2024-05-09 MED ORDER — LEVETIRACETAM 500 MG PO TABS
500.0000 mg | ORAL_TABLET | Freq: Two times a day (BID) | ORAL | Status: AC
Start: 2024-05-09 — End: 2024-05-14
  Administered 2024-05-09 – 2024-05-14 (×11): 500 mg via ORAL
  Filled 2024-05-09 (×11): qty 1

## 2024-05-09 MED ADMIN — Levetiracetam Tab 500 MG: 500 mg | ORAL | NDC 65862024608

## 2024-05-09 MED FILL — Levetiracetam Tab 500 MG: 500.0000 mg | ORAL | Qty: 1 | Status: AC

## 2024-05-09 NOTE — Progress Notes (Signed)
 PCCM Interval Progress Note  Called to the bedside for acute agitation. Patient confused, yelling and has been pulling at lines and dressings. Discussed with Dr. [name] and will give 5 mg IV Haldol. Will start 25 mg Seroquel at bedtime. Continue delirium precautions: re-orient frequently and normalize sleep/wake cycles.   Rexene LOISE Blush, PA-C [facility] 05/09/24 4:12 PM  Please see [email or web address] for pager details.  From 7A-7P if no response, please call [phone] After hours, please call ELink [phone]

## 2024-05-09 NOTE — Progress Notes (Signed)
 A-line removed, without difficulty, bleeding controlled with pressure and pressure dressing.

## 2024-05-09 NOTE — Progress Notes (Signed)
 NAME:  Roger Black, MRN:  [id number], DOB:  February 10, 1956, LOS: 2 ADMISSION DATE:  05/07/2024, CONSULTATION DATE:  05/08/2024 REFERRING MD:  CLANCY, CHIEF COMPLAINT:  Post op medical management    History of Present Illness:  Roger Black is a 68 year old male with a past medical history significant for HFrEF, lung cancer s/p radiation December 2024, COPD, CKD, and HTN who initially presented to [facility] 04/28/24 for complaints of leg pain.  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  Patient was discharged from [facility] to [facility] for neurosurgery evaluation and management  Patient underwent right craniotomy for brain tumor evaluation per Dr. [name] on 10/16, postoperatively patient was admitted to ICU with PCCM consulted for medical management.  Pertinent  Medical History  HFrEF, lung cancer s/p radiation December 2024, COPD, CKD, and HTN   Significant Hospital Events: Including procedures, antibiotic start and stop dates in addition to other pertinent events   10/06 initially presented to [facility] for intractable  leg pain MRI lumbar spine revealed multilevel stenosis with enhancement of several nerve roots of the cauda equina without nodular masslike enhancements 10/7 neurosurgery evaluated with recommendations of LP, interventional radiology performed LP with approximately 20 mL clear colorless fluid obtained.  Opening pressure 22 closing pressure 9 10/8 meningitis/encephalitis panel negative Acyclovir and ampicillin discontinued 10/9 LP repeated per neurology recommendations, flow cytometry and cytology with cellblock preparation recommended 10/13 MRI brain completed and revealed circumscribed enhancing lesion in the right frontal lobe measuring 14 x 13 x 13 and cerebellar hemisphere 17 x 15 x 14 concerning for metastatic disease associated with vasogenic edema 10/15  transferred to [facility] for neurosurgery evaluation 10/16 underwent right craniotomy for resection of brain tumor per Dr. [name]  Interim History / Subjective:  Confused this morning but interactive. Denies pain. Pulled JP drain out overnight.   Objective    Blood pressure (!) 115/97, pulse 100, temperature 98.8 F (37.1 C), temperature source Axillary, resp. rate 16, height 5' 9 (1.753 m), SpO2 (!) 77%.        Intake/Output Summary (Last 24 hours) at 05/09/2024 0959 Last data filed at 05/09/2024 0900 Gross per 24 hour  Intake 310 ml  Output 1680 ml  Net -1370 ml   There were no vitals filed for this visit.  Examination: General: chronically ill appearing gentleman, resting in bed, no acute distress HEENT: [place]/AT, sclera anicteric, PERRL, EOMI Neuro: alert and oriented to person and place, follows commands throughout with good strength, non-focal CV: regular rate and rhythm, normal S1 and S2 PULM: breathing comfortably on room air, clear to auscultation bilaterally GI: soft, non-tender, non-distended, +bowel sounds Extremities: warm, dry, no edema Skin: no rashes or lesions, medipore tape dressing on right side of head c/d/i  Resolved problem list   Assessment and Plan   Right frontal lobe brain mass now s/p right craniotomy for resection of tumor Vasogenic edema MRI brain completed and revealed circumscribed enhancing lesion in the right frontal lobe measuring 14 x 13 x 13 and cerebellar hemisphere 17 x 15 x 14 concerning for metastatic disease associated with vasogenic edema.  - Primary management per Dr. 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 Neuro consulted and underwent 2 LP which show atypical cells but no further information to identify type of malignancy. Transferred to [facility] for brain biopsy. No complaint of pain currently. - Continue PRN Tylenol, PRN Foricet, Decadron, PRN Fentanyl - Continue scheduled Lyrica   History of COPD Adequate gas exchange on room air. - Continue supplemental oxygen  as needed to maintain SpO2>90% - PRN BDs   History  of CAD Essential hypertension Hyperlipidemia  HFrEF Most recent echo August 2025 with EF of 45%. Appears euvolemic.  - Continue home Irbesartan - Continue home statin - Continue PRN labetalol to maintain SBP<150  CKD stage IIIa Baseline Cr appears to be around 1.1-1.3.  - Continue to trend BMP - Continue to monitor urine output - Avoid nephrotoxins and ensure adequate renal perfusion  Labs   CBC: Recent Labs  Lab 05/07/24 0555 05/08/24 0133 05/08/24 0856 05/09/24 0514  WBC 7.3 7.7  --  10.0  NEUTROABS 5.0  --   --   --   HGB 13.6 13.6 11.6* 12.9*  HCT 40.3 40.3 34.0* 38.6*  MCV 97.3 98.3  --  97.5  PLT 180 195  --  176    Basic Metabolic Panel: Recent Labs  Lab 05/07/24 0555 05/08/24 0133 05/08/24 0856 05/09/24 0514  NA 132* 132* 131* 134*  K 4.9 5.1 4.2 4.6  CL 97* 97*  --  99  CO2 25 23  --  22  GLUCOSE 124* 123*  --  136*  BUN 25* 25*  --  21  CREATININE 1.31* 1.19  --  1.06  CALCIUM 9.5 9.4  --  9.5  MG 2.0 2.0  --   --    GFR: Estimated Creatinine Clearance: 76.2 mL/min (by C-G formula based on SCr of 1.06 mg/dL). Recent Labs  Lab 05/07/24 0555 05/08/24 0133 05/09/24 0514  WBC 7.3 7.7 10.0    Liver Function Tests: Recent Labs  Lab 05/07/24 0555  AST 21  ALT 19  ALKPHOS 73  BILITOT 0.9  PROT 6.8  ALBUMIN 3.8   No results for input(s): LIPASE, AMYLASE in the last 168 hours. No results for input(s): AMMONIA in the last 168 hours.  ABG     Component Value Date/Time   PHART 7.410 05/08/2024 0856   PCO2ART 32.9 05/08/2024 0856   PO2ART 167 (H) 05/08/2024 0856   HCO3 21.0 05/08/2024 0856   TCO2 22 05/08/2024 0856   ACIDBASEDEF 3.0 (H) 05/08/2024 0856   O2SAT 100 05/08/2024 0856     Coagulation Profile: Recent Labs  Lab 05/07/24 0555 05/08/24 0133  INR 1.0 1.1    Cardiac Enzymes: No results for input(s): CKTOTAL, CKMB, CKMBINDEX, TROPONINI in the last 168 hours.  HbA1C: No results found for: HGBA1C  CBG: No results for input(s): GLUCAP in the last 168 hours.     Rexene LOISE Blush, PA-C [facility] 05/09/24 10:31 AM  Please see [email or web address] for pager details.  From 7A-7P if no response, please call [phone] After hours, please call ELink [phone]

## 2024-05-10 LAB — BASIC METABOLIC PANEL WITH GFR
Anion gap: 11 (ref 5–15)
BUN: 30 mg/dL — ABNORMAL HIGH (ref 8–23)
CO2: 24 mmol/L (ref 22–32)
Calcium: 9.8 mg/dL (ref 8.9–10.3)
Chloride: 102 mmol/L (ref 98–111)
Creatinine, Ser: 1.18 mg/dL (ref 0.61–1.24)
GFR, Estimated: >60 mL/min (ref >60)
Glucose, Bld: 151 mg/dL — ABNORMAL HIGH (ref 70–99)
Potassium: 4.5 mmol/L (ref 3.5–5.1)
Sodium: 137 mmol/L (ref 135–145)

## 2024-05-10 LAB — CBC
HCT: 43.5 % (ref 39.0–52.0)
Hemoglobin: 14.3 g/dL (ref 13.0–17.0)
MCH: 32.4 pg (ref 26.0–34.0)
MCHC: 32.9 g/dL (ref 30.0–36.0)
MCV: 98.6 fL (ref 80.0–100.0)
Platelets: 191 K/uL (ref 150–400)
RBC: 4.41 MIL/uL (ref 4.22–5.81)
RDW: 12.7 % (ref 11.5–15.5)
WBC: 8.4 K/uL (ref 4.0–10.5)
nRBC: 0 % (ref 0.0–0.2)

## 2024-05-10 LAB — MAGNESIUM: Magnesium: 2.3 mg/dL (ref 1.7–2.4)

## 2024-05-10 MED ORDER — ORAL CARE MOUTH RINSE: 15.0000 mL | OROMUCOSAL | Status: DC | PRN

## 2024-05-10 MED ORDER — OXYCODONE HCL 5 MG PO TABS
5.0000 mg | ORAL_TABLET | ORAL | Status: DC | PRN
  Administered 2024-05-10 – 2024-05-15 (×18): 5 mg via ORAL
  Filled 2024-05-10 (×18): qty 1

## 2024-05-10 MED ORDER — WHITE PETROLATUM EX OINT: TOPICAL_OINTMENT | CUTANEOUS | Status: DC | PRN

## 2024-05-10 NOTE — Progress Notes (Signed)
 Informed by nurse that patient's wife wanted to make him DNR. I called [name] and verified that she wanted to change code status to DNR. She is in agreement.  Verdon Gore, MD Pulmonary and Critical Care Medicine [facility] 05/10/2024 6:41 PM Pager: see AMION  If no response to pager, please call critical care on call (see AMION) until 7pm After 7:00 pm call Elink

## 2024-05-10 NOTE — Progress Notes (Signed)
 NAME:  Roger Black, MRN:  [id number], DOB:  August 04, 1955, LOS: 3 ADMISSION DATE:  05/07/2024, CONSULTATION DATE:  05/08/2024 REFERRING MD:  CLANCY, CHIEF COMPLAINT:  Post op medical management    History of Present Illness:  Roger Black is a 68 year old male with a past medical history significant for HFrEF, lung cancer s/p radiation December 2024, COPD, CKD, and HTN who initially presented to [facility] 04/28/24 for complaints of leg pain.  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  Patient was discharged from [facility] to [facility] for neurosurgery evaluation and management  Patient underwent right craniotomy for brain tumor evaluation per Dr. [name] on 10/16, postoperatively patient was admitted to ICU with PCCM consulted for medical management.  Pertinent  Medical History  HFrEF, lung cancer s/p radiation December 2024, COPD, CKD, and HTN   Significant Hospital Events: Including procedures, antibiotic start and stop dates in addition to other pertinent events   10/06 initially presented to [facility] for intractable  leg pain MRI lumbar spine revealed multilevel stenosis with enhancement of several nerve roots of the cauda equina without nodular masslike enhancements 10/7 neurosurgery evaluated with recommendations of LP, interventional radiology performed LP with approximately 20 mL clear colorless fluid obtained.  Opening pressure 22 closing pressure 9 10/8 meningitis/encephalitis panel negative Acyclovir and ampicillin discontinued 10/9 LP repeated per neurology recommendations, flow cytometry and cytology with cellblock preparation recommended 10/13 MRI brain completed and revealed circumscribed enhancing lesion in the right frontal lobe measuring 14 x 13 x 13 and cerebellar hemisphere 17 x 15 x 14 concerning for metastatic disease associated with vasogenic edema 10/15  transferred to [facility] for neurosurgery evaluation 10/16 underwent right craniotomy for resection of brain tumor per Dr. [name]  Interim History / Subjective:  Agitated, delirious, appears to be in pain  Objective    Blood pressure (!) 160/87, pulse (!) 102, temperature 98.3 F (36.8 C), temperature source Oral, resp. rate 14, height 5' 9 (1.753 m), SpO2 93%.        Intake/Output Summary (Last 24 hours) at 05/10/2024 1033 Last data filed at 05/10/2024 0600 Gross per 24 hour  Intake 120 ml  Output 1000 ml  Net -880 ml   There were no vitals filed for this visit.  Examination: Chronically ill appearing Delirious, agitated, redirectable Does track and speak and follow commands but is not oriented to time, place situation.    Resolved problem list   Assessment and Plan   Right frontal lobe brain mass now s/p right craniotomy for resection of tumor Vasogenic edema MRI brain completed and revealed circumscribed enhancing lesion in the right frontal lobe measuring 14 x 13 x 13 and cerebellar hemisphere 17 x 15 x 14 concerning for metastatic disease associated with vasogenic edema.  - Primary management per Dr. [name] - Ensure adequate pain control. Will start some low dose oxycodone today.  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 Neuro consulted and underwent 2 LP which show atypical cells but no further information to identify type of malignancy. Transferred to [facility] for brain biopsy. No complaint of pain currently. - Continue PRN Tylenol, PRN Foricet, Decadron, PRN Fentanyl -  Continue scheduled Lyrica   History of COPD Adequate gas  exchange on room air. - Continue supplemental oxygen  as needed to maintain SpO2>90% - PRN BDs   History  of CAD Essential hypertension Hyperlipidemia  HFrEF Most recent echo August 2025 with EF of 45%. Appears euvolemic.  - Continue home Irbesartan - Continue home statin - Continue PRN labetalol to maintain SBP<150  CKD stage IIIa Baseline Cr appears to be around 1.1-1.3.  - Continue to trend BMP - Continue to monitor urine output - Avoid nephrotoxins and ensure adequate renal perfusion  Goals of care Will discuss with wife today. Patient not oriented enough to participate in complex medical decision making. Will reach out to oncology as well. Suspect given his currently level of encephalopathy not a candidate for additional therapies. Consult to palliative care.   Verdon Gore, MD Pulmonary and Critical Care Medicine [facility] 05/10/2024 10:36 AM Pager: see AMION  If no response to pager, please call critical care on call (see AMION) until 7pm After 7:00 pm call Elink      Labs   CBC: Recent Labs  Lab 05/07/24 0555 05/08/24 0133 05/08/24 0856 05/09/24 0514 05/10/24 0558  WBC 7.3 7.7  --  10.0 8.4  NEUTROABS 5.0  --   --   --   --   HGB 13.6 13.6 11.6* 12.9* 14.3  HCT 40.3 40.3 34.0* 38.6* 43.5  MCV 97.3 98.3  --  97.5 98.6  PLT 180 195  --  176 191    Basic Metabolic Panel: Recent Labs  Lab 05/07/24 0555 05/08/24 0133 05/08/24 0856 05/09/24 0514 05/10/24 0558  NA 132* 132* 131* 134* 137  K 4.9 5.1 4.2 4.6 4.5  CL 97* 97*  --  99 102  CO2 25 23  --  22 24  GLUCOSE 124* 123*  --  136* 151*  BUN 25* 25*  --  21 30*  CREATININE 1.31* 1.19  --  1.06 1.18  CALCIUM 9.5 9.4  --  9.5 9.8  MG 2.0 2.0  --   --  2.3   GFR: Estimated Creatinine Clearance: 68.5 mL/min (by C-G formula based on SCr of 1.18 mg/dL). Recent Labs  Lab 05/07/24 0555 05/08/24 0133 05/09/24 0514 05/10/24 0558  WBC 7.3 7.7 10.0 8.4    Liver Function Tests: Recent Labs   Lab 05/07/24 0555  AST 21  ALT 19  ALKPHOS 73  BILITOT 0.9  PROT 6.8  ALBUMIN 3.8   No results for input(s): LIPASE, AMYLASE in the last 168 hours. No results for input(s): AMMONIA in the last 168 hours.  ABG    Component Value Date/Time   PHART 7.410 05/08/2024 0856   PCO2ART 32.9 05/08/2024 0856   PO2ART 167 (H) 05/08/2024 0856   HCO3 21.0 05/08/2024 0856   TCO2 22 05/08/2024 0856   ACIDBASEDEF 3.0 (H) 05/08/2024 0856   O2SAT 100 05/08/2024 0856

## 2024-05-10 NOTE — Progress Notes (Signed)
  Interdisciplinary Goals of Care Family Meeting   Date carried out: 05/10/2024  Location of the meeting: Bedside  Member's involved: Physician, Bedside Registered Nurse, and Family Member or next of kin  Durable Power of Attorney or acting medical decision maker: Roger Black, spouse    Discussion: We discussed goals of care for Roger Black .   Updated [name] at the bedside. 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 This makes him stage IV for cancer. Discussed his ongoing encephalopathy and likely delirium. I've discontinued and lower the doses on some of his sedating medications, lyrica and robaxin. Continue prn pain control. Obviously not a candidate for any kind of treatment until he is well enough to be discharged and receive outpatient radiation/immunotherapy etc. I've asked Roger Black to consider what Roger Black's goals would be, especially in regards to code status. She will consider his wishes and talk with his sister and son. I've consulted palliative care to help with goals of care discussions as well.   Code status:   Code Status: Full Code   Disposition: Continue current acute care  Time spent for the meeting: 25 minutes    [name], MD  05/10/2024, 3:11 PM

## 2024-05-10 NOTE — Progress Notes (Signed)
 PT Cancellation Note  Patient Details Name: Demitrius Crass MRN: [id number] DOB: [birth year]   Cancelled Treatment:    Reason Eval/Treat Not Completed: (P) Fatigue/lethargy limiting ability to participate. Pt very lethargic, but does arouse with max stimulation, likely from meds per RN. Will plan to follow-up later once more alert.   Theo Ferretti, PT, DPT Acute Rehabilitation Services  Office: [phone]    Theo CHRISTELLA Ferretti 05/10/2024, 11:49 AM

## 2024-05-10 NOTE — Evaluation (Signed)
 Physical Therapy Re-Evaluation Patient Details Name: Roger Black MRN: [id number] DOB: Jul 21, 1956 Today's Date: 05/10/2024  History of Present Illness  Pt is a 68 y.o. male who presented 04/28/24 with back and leg pain. 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 S/p LP 10/7 & 10/9, which showed atypical cells with predominant lymphocytes but was not conclusive for malignancy. Brain MRI showed circumscribed enhancing lesions in the right frontal lobe and left cerebral hemisphere concerning for metastatic disease. Transferred to [facility] from [facility] 10/14. S/p R crani for resection of brain tumor 10/16. PMH: asthma, arthritis, aortic atherosclerosis, COPD, CHF, CKD stage 3a, CAD, HTN, HLD, sleep apnea, squamous cell carcinoma in situ   Clinical Impression  Pt presents with condition above and deficits mentioned below, see PT Problem List. PTA, he was independent without DME, living with his wife in a 1-level house with a level entry. Currently, the pt is very lethargic, needing max stimulation and being transitioned supine to sit on EOB to arouse and open his eyes. Pt distracted by back and bil leg pain as well, limiting him from being able to attempt to ambulate this date. He displays deficits in cognition, balance, power, strength, and endurance and is at high risk for falls. He required maxA for bed mobility and modA to transfer sit to stand. He was unable to pivot despite cues due to pain, often actively resisting attempts to guide him to pivot. As the pt has had a drastic functional decline, he could potentially benefit from intensive inpatient rehab, > 3 hours/day, pending his GOC decisions. Will continue to follow acutely.        If plan is discharge home, recommend the following: Two people to help with walking and/or transfers;A lot of help with  bathing/dressing/bathroom;Assistance with cooking/housework;Direct supervision/assist for medications management;Direct supervision/assist for financial management;Assist for transportation;Help with stairs or ramp for entrance;Supervision due to cognitive status   Can travel by private vehicle        Equipment Recommendations Rolling walker (2 wheels);BSC/3in1;Wheelchair (measurements PT);Wheelchair cushion (measurements PT);Hospital bed (pending progress)  Recommendations for Other Services  Rehab consult    Functional Status Assessment Patient has had a recent decline in their functional status and demonstrates the ability to make significant improvements in function in a reasonable and predictable amount of time.     Precautions / Restrictions Precautions Precautions: Fall Recall of Precautions/Restrictions: Impaired Restrictions Weight Bearing Restrictions Per Provider Order: No      Mobility  Bed Mobility Overal bed mobility: Needs Assistance Bed Mobility: Sit to Supine, Supine to Sit     Supine to sit: Max assist, HOB elevated Sit to supine: Max assist, HOB elevated   General bed mobility comments: Pt initially requiring total assist to initiate sitting up but pt activated core once the transition began, needing maxA to ascend trunk, manage legs, and scoot hips to EOB. MaxA to lift legs and direct trunk with return to supine    Transfers Overall transfer level: Needs assistance Equipment used: 1 person hand held assist Transfers: Sit to/from Stand Sit to Stand: Mod assist           General transfer comment: Pt able to stand from EOB 2x with modA to power up to stand and extend hips and trunk, with pt often resting his head on therapist's shoulder due to lethargy. Pt unable to swing hips to pivot despite assistance and cues,  actively resisting due to pain instead.    Ambulation/Gait               General Gait Details: pt declined due to pain despite max  cuing and attempts.  Stairs            Wheelchair Mobility     Tilt Bed    Modified Rankin (Stroke Patients Only)       Balance Overall balance assessment: Needs assistance Sitting-balance support: Bilateral upper extremity supported, Feet supported Sitting balance-Leahy Scale: Poor Sitting balance - Comments: UE support and min-modA to sit statically EOB, pt restless due to pain   Standing balance support: Bilateral upper extremity supported, During functional activity Standing balance-Leahy Scale: Poor Standing balance comment: reliant on UE support and modA                             Pertinent Vitals/Pain Pain Assessment Pain Assessment: Faces Faces Pain Scale: Hurts even more Pain Location: everywhere and back specifically Pain Descriptors / Indicators: Discomfort, Grimacing, Guarding, Moaning Pain Intervention(s): Monitored during session, Limited activity within patient's tolerance, Repositioned, Patient requesting pain meds-RN notified    Home Living Family/patient expects to be discharged to:: Private residence Living Arrangements: Spouse/significant other Available Help at Discharge: Family;Available 24 hours/day Type of Home: House Home Access: Level entry       Home Layout: One level Home Equipment: Cane - single point      Prior Function Prior Level of Function : Independent/Modified Independent;Working/employed             Mobility Comments: indep with no AD ADLs Comments: MOD I-I with ADL/IADL, works at [employer]     Extremity/Trunk Assessment   Upper Extremity Assessment Upper Extremity Assessment: Defer to OT evaluation    Lower Extremity Assessment Lower Extremity Assessment: Generalized weakness;Difficult to assess due to impaired cognition    Cervical / Trunk Assessment Cervical / Trunk Assessment: Normal (back pain)  Communication   Communication Communication: Other (comment) (pt mumbling often  likely due to lethargy)    Cognition Arousal: Lethargic Behavior During Therapy: Flat affect, Restless   PT - Cognitive impairments: Difficult to assess, Awareness, Attention, Memory, Initiation, Sequencing, Problem solving, Safety/Judgement Difficult to assess due to: Level of arousal                     PT - Cognition Comments: Pt very lethargic, needing max stimulation to arouse and maxA to sit him up EOB before he opened his eyes. Pt often closing his eyes and needing cues to open again. Poor initiation noted by pt, following simple cues <20% of the time. Pt seeming to forget where he was and the situation, but was aware he was in a medical environment. Following commands: Impaired Following commands impaired: Follows one step commands with increased time, Follows one step commands inconsistently     Cueing Cueing Techniques: Verbal cues, Tactile cues     General Comments General comments (skin integrity, edema, etc.): VSS    Exercises     Assessment/Plan    PT Assessment Patient needs continued PT services  PT Problem List Decreased strength;Decreased activity tolerance;Decreased balance;Decreased mobility;Decreased cognition;Decreased knowledge of use of DME;Decreased safety awareness;Pain       PT Treatment Interventions DME instruction;Gait training;Functional mobility training;Therapeutic activities;Therapeutic exercise;Balance training;Neuromuscular re-education;Cognitive remediation;Patient/family education;Wheelchair mobility training    PT Goals (Current goals can be found in the Care Plan section)  Acute Rehab PT Goals Patient Stated Goal: to improve and make GOC decisions per wife PT Goal Formulation: With patient/family Time For Goal Achievement: 05/24/24 Potential to Achieve Goals: Fair    Frequency Min 3X/week     Co-evaluation               AM-PAC PT 6 Clicks Mobility  Outcome Measure Help needed turning from your back to your side  while in a flat bed without using bedrails?: A Lot Help needed moving from lying on your back to sitting on the side of a flat bed without using bedrails?: A Lot Help needed moving to and from a bed to a chair (including a wheelchair)?: Total Help needed standing up from a chair using your arms (e.g., wheelchair or bedside chair)?: A Lot Help needed to walk in hospital room?: Total Help needed climbing 3-5 steps with a railing? : Total 6 Click Score: 9    End of Session   Activity Tolerance: Patient limited by lethargy Patient left: in bed;with call bell/phone within reach;with bed alarm set;with family/visitor present Nurse Communication: Mobility status PT Visit Diagnosis: Unsteadiness on feet (R26.81);Other abnormalities of gait and mobility (R26.89);Muscle weakness (generalized) (M62.81);Difficulty in walking, not elsewhere classified (R26.2);Other symptoms and signs involving the nervous system (R29.898);Pain Pain - Right/Left:  (bil) Pain - part of body: Leg (back)    Time: 8458-8391 PT Time Calculation (min) (ACUTE ONLY): 27 min   Charges:   PT Evaluation $PT Re-evaluation: 1 Re-eval PT Treatments $Therapeutic Activity: 8-22 mins PT General Charges $$ ACUTE PT VISIT: 1 Visit         Theo Ferretti, PT, DPT Acute Rehabilitation Services  Office: [phone]   Theo CHRISTELLA Ferretti 05/10/2024, 5:23 PM

## 2024-05-11 DIAGNOSIS — Z7189 Other specified counseling: Secondary | ICD-10-CM

## 2024-05-11 DIAGNOSIS — R937 Abnormal findings on diagnostic imaging of other parts of musculoskeletal system: Secondary | ICD-10-CM

## 2024-05-11 DIAGNOSIS — Z515 Encounter for palliative care: Secondary | ICD-10-CM

## 2024-05-11 DIAGNOSIS — D496 Neoplasm of unspecified behavior of brain: Secondary | ICD-10-CM

## 2024-05-11 LAB — BASIC METABOLIC PANEL WITH GFR
Anion gap: 12 (ref 5–15)
BUN: 46 mg/dL — ABNORMAL HIGH (ref 8–23)
CO2: 22 mmol/L (ref 22–32)
Calcium: 9.6 mg/dL (ref 8.9–10.3)
Chloride: 100 mmol/L (ref 98–111)
Creatinine, Ser: 1.32 mg/dL — ABNORMAL HIGH (ref 0.61–1.24)
GFR, Estimated: 59 mL/min — ABNORMAL LOW (ref >60)
Glucose, Bld: 139 mg/dL — ABNORMAL HIGH (ref 70–99)
Potassium: 4.7 mmol/L (ref 3.5–5.1)
Sodium: 134 mmol/L — ABNORMAL LOW (ref 135–145)

## 2024-05-11 LAB — CBC
HCT: 40.8 % (ref 39.0–52.0)
Hemoglobin: 13.7 g/dL (ref 13.0–17.0)
MCH: 32.7 pg (ref 26.0–34.0)
MCHC: 33.6 g/dL (ref 30.0–36.0)
MCV: 97.4 fL (ref 80.0–100.0)
Platelets: 188 K/uL (ref 150–400)
RBC: 4.19 MIL/uL — ABNORMAL LOW (ref 4.22–5.81)
RDW: 12.7 % (ref 11.5–15.5)
WBC: 6.2 K/uL (ref 4.0–10.5)
nRBC: 0 % (ref 0.0–0.2)

## 2024-05-11 LAB — MAGNESIUM: Magnesium: 2.5 mg/dL — ABNORMAL HIGH (ref 1.7–2.4)

## 2024-05-11 NOTE — Progress Notes (Signed)
 Inpatient Rehab Admissions Coordinator Note:   Per PT patient was screened for CIR candidacy by Jadan Hinojos SHAUNNA Yvone Cohens, CCC-SLP. At this time, pt appears to be a potential candidate for CIR. If pt would like to be considered, please place an IP Rehab MD consult order.   Tinnie Yvone Cohens, MS, CCC-SLP Admissions Coordinator [phone] 05/11/24 4:53 PM

## 2024-05-11 NOTE — Consult Note (Signed)
 Consultation Note Date: 05/11/2024   Patient Name: Roger Black  DOB: 1955/12/01  MRN: [id number]  Age / Sex: 68 y.o., male  PCP: [name], MD Referring Physician: Meade Verdon RAMAN, MD  Reason for Consultation: Establishing goals of care  HPI/Patient Profile: 68 y.o. male  with past medical history of  68 year old male with a past medical history significant for HFrEF, lung cancer s/p radiation December 2024, COPD, CKD, and HTN who initially presented to [facility] 04/28/24 for complaints of leg pain.  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  Patient was discharged from [facility] to [facility] for neurosurgery evaluation and management   Patient underwent right craniotomy for excisional biopsy of this right insular tumor per Dr. [name] on 10/16, postoperatively patient was admitted to ICU with PCCM consulted for medical management.   PMT has been consulted to assist with goals of care conversation. Patient/Family face treatment option decisions, advanced directive decisions and anticipatory care needs.   Family face treatment option decision, advance directive decisions and anticipatory care needs.   Clinical Assessment and Goals of Care:  I have reviewed medical records including EPIC notes, labs and imaging, assessed the patient and then met with patient and his [name] to discuss diagnosis prognosis, GOC, EOL wishes, disposition and options.  I introduced Palliative Medicine as specialized medical care for people living with serious illness. It focuses on providing relief from the symptoms and stress of a serious illness. The goal is to improve quality of life for both the patient and the family.  Created space and opportunity for family to explore thoughts and feelings regarding patient's current medical condition.   Patient appears to  be resting comfortably in bed. Not in any form of distress. He is alert, but a little drowsy due to recent Oxycodone dose that he just took. He is chronically-ill appearing, but not in any acute distress. His [name] is present at bedside. I discussed that I was consulted to provide an added layer of support to him and his family during this difficult decision as they try to navigate this complex medical situation. They both verbalized appreciation.  I asked him about his pain, he voiced this is pain from my brain cancer.  Both patient and wife requested that we postponed this meeting today, and reconvened it tomorrow as patient wants to get some more rest and would like to participate in the discussion not under medication. I shared that this is ok and that we will arrange the meeting for tomorrow. I shared that I won't be here tomorrow, but  a PMT colleague will be here to talk with them.  I got a report from ICU RN. She reports patient and wife has many lingering questions about the disease process and prognosis including treatment options moving forward. She mentioned that she just medicated patient with pain medicine. No other significant finding reported except for ongoing pain to extremity.   The difference between aggressive medical intervention and comfort care was considered in light of the patient's goals of care. Hospice and Palliative Care services outpatient were explained and offered.   Discussed the importance of continued conversation with family and the medical providers regarding overall plan of care and treatment options, ensuring decisions are within the context of the patient's values and GOCs.   Questions and concerns were addressed.  Hard Choices booklet left for review. The  family was encouraged to call with questions or concerns.  PMT will continue to support holistically.   Palliative Symptoms: Generalized weakness, leg pain and discomfort  Code Status: DNR with limited  Interventions  Primary Decision Maker: Patient  SUMMARY OF RECOMMENDATIONS   Recommendations/Plan:  Code Status: Maintain DNR with pre-arrest interventions desired Goals of care discussion with patient and family scheduled for tomorrow 05/12/2024 Goal of care is medical stabilization and recovery to the extent this is possible Continue to provide psycho-social and emotional support to patient and family Palliative medicine team will continue to follow.   Symptom Management: Per Primary team Palliative medicine is available to assist as needed.    Palliative Prophylaxis:  Aspiration, Bowel Regimen, Delirium Protocol, Frequent Pain Assessment, Oral Care, and Turn Reposition  Prognosis:  Prognosis is poor given NSCLC now with oligometastases  Discharge Planning: To Be Determined      Primary Diagnoses: Present on Admission:  Abnormal findings on diagnostic imaging of spine  Back pain  COPD (chronic obstructive pulmonary disease) (HCC)  Hyperlipidemia  HTN (hypertension)  Brain tumor (HCC)    ExaminationNeck:neck rigidity. Respiratory: No rhonchi or crepitations. Cardiovascular: HR 82 BPM Abdomen: UTSA Musculoskeletal: Generalized weakness Skin: Warm and dry Neurologic: Alert awake oriented to time place and person.  Moves all extremities. Psychiatric: Appears normal.  Normal affect.  Vital Signs: BP (!) 116/104   Pulse 82   Temp 98.4 F (36.9 C) (Axillary)   Resp (!) 22   Ht 5' 9 (1.753 m)   SpO2 94%   BMI 31.22 kg/m  Pain Scale: 0-10   Pain Score: Asleep   SpO2: SpO2: 94 % O2 Device:SpO2: 94 % O2 Flow Rate: .O2 Flow Rate (L/min): 2 L/min   Palliative Assessment/Data:20 to 30%    Total time: I spent 40  minutes in the care of the patient today in the above activities and documenting the encounter.   Detailed review of medical records (labs, imaging, vital signs), medically appropriate exam, discussed with treatment team, counseling and education  to patient, family, & staff, documenting clinical information, coordination of care.     Kathlyne JULIANNA Tracie Mickey, NP  Palliative Medicine Team Team phone # [phone]  Thank you for allowing the Palliative Medicine Team to assist in the care of this patient. Please utilize secure chat with additional questions, if there is no response within 30 minutes please call the above phone number.  Palliative Medicine Team providers are available by phone from 7am to 7pm daily and can be reached through the team cell phone.  Should this patient require assistance outside of these hours, please call the patient's attending physician.

## 2024-05-11 NOTE — Progress Notes (Signed)
 NAME:  Roger Black, MRN:  [id number], DOB:  06/03/1956, LOS: 4 ADMISSION DATE:  05/07/2024, CONSULTATION DATE:  05/08/2024 REFERRING MD:  CLANCY, CHIEF COMPLAINT:  Post op medical management    History of Present Illness:  Roger Black is a 68 year old male with a past medical history significant for HFrEF, lung cancer s/p radiation December 2024, COPD, CKD, and HTN who initially presented to [facility] 04/28/24 for complaints of leg pain.  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  Patient was discharged from [facility] to [facility] for neurosurgery evaluation and management  Patient underwent right craniotomy for brain tumor evaluation per Dr. [name] on 10/16, postoperatively patient was admitted to ICU with PCCM consulted for medical management.  Pertinent  Medical History  HFrEF, lung cancer s/p radiation December 2024, COPD, CKD, and HTN   Significant Hospital Events: Including procedures, antibiotic start and stop dates in addition to other pertinent events   10/06 initially presented to [facility] for intractable  leg pain MRI lumbar spine revealed multilevel stenosis with enhancement of several nerve roots of the cauda equina without nodular masslike enhancements 10/7 neurosurgery evaluated with recommendations of LP, interventional radiology performed LP with approximately 20 mL clear colorless fluid obtained.  Opening pressure 22 closing pressure 9 10/8 meningitis/encephalitis panel negative Acyclovir and ampicillin discontinued 10/9 LP repeated per neurology recommendations, flow cytometry and cytology with cellblock preparation recommended 10/13 MRI brain completed and revealed circumscribed enhancing lesion in the right frontal lobe measuring 14 x 13 x 13 and cerebellar hemisphere 17 x 15 x 14 concerning for metastatic disease associated with vasogenic edema 10/15  transferred to [facility] for neurosurgery evaluation 10/16 underwent right craniotomy for resection of brain tumor per Dr. [name]  Interim History / Subjective:  More appropriate this morning. Clearest his mentation has been. Agitation improved. Loud snoring while sleeping.   Objective    Blood pressure 116/81, pulse 94, temperature 98.4 F (36.9 C), temperature source Axillary, resp. rate 14, height 5' 9 (1.753 m), SpO2 90%.        Intake/Output Summary (Last 24 hours) at 05/11/2024 1040 Last data filed at 05/11/2024 0600 Gross per 24 hour  Intake 240 ml  Output 1000 ml  Net -760 ml   There were no vitals filed for this visit.  Examination: Elderly man on nasal cannula Awakens, normal speech, appropriate Not oriented to situation but to person and place Breathing non labored no wheeze No focal asymmetry Awake and calm   Resolved problem list   Assessment and Plan   Right frontal lobe brain mass now s/p right craniotomy for resection of tumor Vasogenic edema MRI brain completed and revealed circumscribed enhancing lesion in the right frontal lobe measuring 14 x 13 x 13 and cerebellar hemisphere 17 x 15 x 14 concerning for metastatic disease associated with vasogenic edema.  - Primary management per Dr. [name] - Ensure adequate pain control. 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 - Continue supplemental oxygen  as needed to maintain SpO2>90% - PRN BDs  History  of CAD Essential  hypertension Hyperlipidemia  HFrEF Most recent echo August 2025 with EF of 45%. Appears euvolemic.  - Continue home Irbesartan - Continue home statin - Continue PRN labetalol to maintain SBP<150  CKD stage IIIa Baseline Cr appears to be around 1.1-1.3.  - Continue to trend BMP - Continue to monitor urine output - Avoid nephrotoxins and ensure adequate renal perfusion  Goals of care See ipal note from yesterday. DNR. He is more alert and tearful today, starting to realize his circumstances more. Asking for his wife and his son. Says he has a lot he wants to say to his son. Palliative care has been consulted and wife has been updated.   I think he can transfer to med surg. Will need to make some disposition plans for home and then outpatient oncology follow up. Will sign out to [facility] to assume care 10/20.   Roger Gore, MD Pulmonary and Critical Care Medicine [facility] 05/11/2024 10:48 AM Pager: see AMION  If no response to pager, please call critical care on call (see AMION) until 7pm After 7:00 pm call Elink      Labs   CBC: Recent Labs  Lab 05/07/24 0555 05/08/24 0133 05/08/24 0856 05/09/24 0514 05/10/24 0558 05/11/24 0621  WBC 7.3 7.7  --  10.0 8.4 6.2  NEUTROABS 5.0  --   --   --   --   --   HGB 13.6 13.6 11.6* 12.9* 14.3 13.7  HCT 40.3 40.3 34.0* 38.6* 43.5 40.8  MCV 97.3 98.3  --  97.5 98.6 97.4  PLT 180 195  --  176 191 188    Basic Metabolic Panel: Recent Labs  Lab 05/07/24 0555 05/08/24 0133 05/08/24 0856 05/09/24 0514 05/10/24 0558 05/11/24 0621  NA 132* 132* 131* 134* 137 134*  K 4.9 5.1 4.2 4.6 4.5 4.7  CL 97* 97*  --  99 102 100  CO2 25 23  --  22 24 22   GLUCOSE 124* 123*  --  136* 151* 139*  BUN 25* 25*  --  21 30* 46*  CREATININE 1.31* 1.19  --  1.06 1.18 1.32*  CALCIUM 9.5 9.4  --  9.5 9.8 9.6  MG 2.0 2.0  --   --  2.3 2.5*   GFR: Estimated Creatinine Clearance: 61.2 mL/min (A) (by C-G formula based on SCr of 1.32 mg/dL  (H)). Recent Labs  Lab 05/08/24 0133 05/09/24 0514 05/10/24 0558 05/11/24 0621  WBC 7.7 10.0 8.4 6.2    Liver Function Tests: Recent Labs  Lab 05/07/24 0555  AST 21  ALT 19  ALKPHOS 73  BILITOT 0.9  PROT 6.8  ALBUMIN 3.8   No results for input(s): LIPASE, AMYLASE in the last 168 hours. No results for input(s): AMMONIA in the last 168 hours.  ABG    Component Value Date/Time   PHART 7.410 05/08/2024 0856   PCO2ART 32.9 05/08/2024 0856   PO2ART 167 (H) 05/08/2024 0856   HCO3 21.0 05/08/2024 0856   TCO2 22 05/08/2024 0856   ACIDBASEDEF 3.0 (H) 05/08/2024 0856   O2SAT 100 05/08/2024 0856

## 2024-05-12 ENCOUNTER — Ambulatory Visit

## 2024-05-12 DIAGNOSIS — C3490 Malignant neoplasm of unspecified part of unspecified bronchus or lung: Secondary | ICD-10-CM

## 2024-05-12 LAB — BASIC METABOLIC PANEL WITH GFR
Anion gap: 12 (ref 5–15)
BUN: 47 mg/dL — ABNORMAL HIGH (ref 8–23)
CO2: 18 mmol/L — ABNORMAL LOW (ref 22–32)
Calcium: 9.5 mg/dL (ref 8.9–10.3)
Chloride: 102 mmol/L (ref 98–111)
Creatinine, Ser: 1.19 mg/dL (ref 0.61–1.24)
GFR, Estimated: >60 mL/min (ref >60)
Glucose, Bld: 135 mg/dL — ABNORMAL HIGH (ref 70–99)
Potassium: 4.7 mmol/L (ref 3.5–5.1)
Sodium: 132 mmol/L — ABNORMAL LOW (ref 135–145)

## 2024-05-12 LAB — CBC
HCT: 42.2 % (ref 39.0–52.0)
Hemoglobin: 14.1 g/dL (ref 13.0–17.0)
MCH: 32.7 pg (ref 26.0–34.0)
MCHC: 33.4 g/dL (ref 30.0–36.0)
MCV: 97.9 fL (ref 80.0–100.0)
Platelets: 175 K/uL (ref 150–400)
RBC: 4.31 MIL/uL (ref 4.22–5.81)
RDW: 12.4 % (ref 11.5–15.5)
WBC: 5.2 K/uL (ref 4.0–10.5)
nRBC: 0 % (ref 0.0–0.2)

## 2024-05-12 LAB — MAGNESIUM: Magnesium: 2.3 mg/dL (ref 1.7–2.4)

## 2024-05-12 LAB — SURGICAL PATHOLOGY

## 2024-05-12 MED ORDER — POLYETHYLENE GLYCOL 3350 17 G PO PACK
17.0000 g | PACK | Freq: Two times a day (BID) | ORAL | Status: DC
  Administered 2024-05-12 – 2024-05-15 (×6): 17 g via ORAL
  Filled 2024-05-12 (×6): qty 1

## 2024-05-12 NOTE — TOC Progression Note (Signed)
 Transition of Care [facility]) - Progression Note    Patient Details  Name: Roger Black MRN: [id number] Date of Birth: 07-Nov-1955  [facility] CM/SW Contact  Inocente GORMAN Kindle, LCSW Phone Number: 05/12/2024, 1:57 PM  Clinical Narrative:    Inpatient Care Management continuing to follow for needs.    Expected Discharge Plan: Home/Self Care Barriers to Discharge: Continued Medical Work up               Expected Discharge Plan and Services       Living arrangements for the past 2 months: Single Family Home                                       Social Drivers of Health (SDOH) Interventions SDOH Screenings   Food Insecurity: No Food Insecurity (05/07/2024)  Housing: High Risk (05/07/2024)  Transportation Needs: No Transportation Needs (05/07/2024)  Utilities: Not At Risk (05/07/2024)  Financial Resource Strain: Low Risk  (03/07/2024)   Received from [facility]  Recent Concern: Financial Resource Strain - High Risk (01/29/2024)   Received from [facility]  Physical Activity: Inactive (08/07/2023)   Received from [facility]  Social Connections: Unknown (05/07/2024)  Stress: Stress Concern Present (08/07/2023)   Received from [facility]  Tobacco Use: Medium Risk (05/08/2024)  Health Literacy: Adequate Health Literacy (08/07/2023)   Received from [facility]    Readmission Risk Interventions     No data to display

## 2024-05-12 NOTE — Progress Notes (Signed)
 Physical Therapy Treatment Patient Details Name: Roger Black MRN: [id number] DOB: 12-01-1955 Today's Date: 05/12/2024   History of Present Illness Pt is a 68 y.o. male who presented 04/28/24 with back and leg pain. 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 S/p LP 10/7 & 10/9, which showed atypical cells with predominant lymphocytes but was not conclusive for malignancy. Brain MRI showed circumscribed enhancing lesions in the right frontal lobe and left cerebral hemisphere concerning for metastatic disease. Transferred to [facility] from [name] 10/14. S/p R crani for resection of brain tumor 10/16. PMH: asthma, arthritis, aortic atherosclerosis, COPD, CHF, CKD stage 3a, CAD, HTN, HLD, sleep apnea, squamous cell carcinoma in situ    PT Comments  Pt confused but engaging with PT/OT and was very thankful for our services. Pt became tearful at times and was very happy he could stand up and take a few steps without being in extreme pain. Unfortunately patient remains to have both cognitive and functional deficits and requires assist for all ADLs and mobility at this time. Acute PT to cont to follow. Aware palliative is meeting with family today. At this time still recommending inpatient rehab program > 3 hrs a day for maximal functional recovery however will re-assess next session.    If plan is discharge home, recommend the following: Two people to help with walking and/or transfers;A lot of help with bathing/dressing/bathroom;Assistance with cooking/housework;Direct supervision/assist for medications management;Direct supervision/assist for financial management;Assist for transportation;Help with stairs or ramp for entrance;Supervision due to cognitive status   Can travel by private vehicle        Equipment Recommendations  Rolling walker (2  wheels);BSC/3in1;Wheelchair (measurements PT);Wheelchair cushion (measurements PT);Hospital bed (pending progress)    Recommendations for Other Services Rehab consult     Precautions / Restrictions Precautions Precautions: Fall Recall of Precautions/Restrictions: Impaired Precaution/Restrictions Comments: BP <150 Restrictions Weight Bearing Restrictions Per Provider Order: No     Mobility  Bed Mobility Overal bed mobility: Needs Assistance Bed Mobility: Rolling, Sidelying to Sit, Sit to Sidelying Rolling: Supervision Sidelying to sit: Min assist, +2 for safety/equipment Supine to sit: Min assist, +2 for safety/equipment Sit to supine: HOB elevated, Min assist   General bed mobility comments: increased time and cueing for technique, minA for trunk elevation    Transfers Overall transfer level: Needs assistance Equipment used: 2 person hand held assist Transfers: Sit to/from Stand Sit to Stand: Min assist, +2 physical assistance           General transfer comment: minA to power up, pt with wide base of support, required significant time and 2 trials prior to standing    Ambulation/Gait               General Gait Details: pt took 5 steps forwards and backwards with bilat HHA at [facility] level prior to noted bilat knee instability and fatigue   Stairs             Wheelchair Mobility     Tilt Bed    Modified Rankin (Stroke Patients Only)       Balance Overall balance assessment: Needs assistance Sitting-balance support: No upper extremity supported, Feet supported Sitting balance-Leahy Scale: Fair Sitting balance - Comments: min guard   Standing balance support: Bilateral upper extremity supported, During functional activity Standing balance-Leahy Scale: Poor Standing balance comment: relies on external support  Communication Communication Communication: No apparent difficulties  Cognition Arousal:  Alert Behavior During Therapy: Flat affect, Lability                           PT - Cognition Comments: pt emotional, tearful at times, pt with some confusion regarding situation and place. Aware he had surgery but then stated they said they have to do surgery pt re-oriented several times t/o session Following commands: Impaired Following commands impaired: Follows one step commands with increased time, Follows multi-step commands inconsistently    Cueing Cueing Techniques: Verbal cues, Tactile cues  Exercises      General Comments General comments (skin integrity, edema, etc.): VSS      Pertinent Vitals/Pain Pain Assessment Pain Assessment: Faces Faces Pain Scale: Hurts little more Pain Location: everywhere and back specifically Pain Descriptors / Indicators: Discomfort, Grimacing, Guarding Pain Intervention(s): Monitored during session    Home Living                          Prior Function            PT Goals (current goals can now be found in the care plan section) Acute Rehab PT Goals PT Goal Formulation: With patient/family Time For Goal Achievement: 05/24/24 Potential to Achieve Goals: Fair Progress towards PT goals: Progressing toward goals    Frequency    Min 3X/week      PT Plan      Co-evaluation              AM-PAC PT 6 Clicks Mobility   Outcome Measure  Help needed turning from your back to your side while in a flat bed without using bedrails?: A Little Help needed moving from lying on your back to sitting on the side of a flat bed without using bedrails?: A Little Help needed moving to and from a bed to a chair (including a wheelchair)?: A Lot Help needed standing up from a chair using your arms (e.g., wheelchair or bedside chair)?: A Lot Help needed to walk in hospital room?: Total Help needed climbing 3-5 steps with a railing? : Total 6 Click Score: 12    End of Session Equipment Utilized During Treatment:  Gait belt Activity Tolerance: Patient limited by lethargy Patient left: in bed;with call bell/phone within reach;with bed alarm set;with family/visitor present Nurse Communication: Mobility status PT Visit Diagnosis: Unsteadiness on feet (R26.81);Other abnormalities of gait and mobility (R26.89);Muscle weakness (generalized) (M62.81);Difficulty in walking, not elsewhere classified (R26.2);Other symptoms and signs involving the nervous system (R29.898);Pain Pain - Right/Left:  (bil) Pain - part of body: Leg (back)     Time: 8865-8841 PT Time Calculation (min) (ACUTE ONLY): 24 min  Charges:    $Gait Training: 8-22 mins PT General Charges $$ ACUTE PT VISIT: 1 Visit                     Norene Ames, PT, DPT Acute Rehabilitation Services Secure chat preferred Office #: [phone]    Norene CHRISTELLA Ames 05/12/2024, 2:31 PM

## 2024-05-12 NOTE — Evaluation (Signed)
 Occupational Therapy Evaluation Patient Details Name: Roger Black MRN: [id number] DOB: August 07, 1955 Today's Date: 05/12/2024   History of Present Illness   Pt is a 68 y.o. male who presented 04/28/24 with back and leg pain. 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 S/p LP 10/7 & 10/9, which showed atypical cells with predominant lymphocytes but was not conclusive for malignancy. Brain MRI showed circumscribed enhancing lesions in the right frontal lobe and left cerebral hemisphere concerning for metastatic disease. Transferred to [facility] from [facility] 10/14. S/p R crani for resection of brain tumor 10/16. PMH: asthma, arthritis, aortic atherosclerosis, COPD, CHF, CKD stage 3a, CAD, HTN, HLD, sleep apnea, squamous cell carcinoma in situ     Clinical Impressions PTA patient independent and working. Admitted for above and presents with problem list below.  Today, he required min assist +2 for bed mobility and transfers, setup to mod assist for Adls.  He demonstrates slow processing and decreased problem solving, requires cueing for safety during session.  Pt initially fearful to mobilize, but once upright reports feeling better. VSS (SBP decreased but pt asymptomatic).  He will benefit from further cognitive assessment.  Based on performance today, recommend >3hrs/day inpatient setting at dc but pending palliative care meeting may need to update recommendations based on pt/family decisions.      If plan is discharge home, recommend the following:   A lot of help with bathing/dressing/bathroom;A little help with walking and/or transfers;Assistance with cooking/housework;Direct supervision/assist for medications management;Direct supervision/assist for financial management;Assist for transportation;Help with stairs or ramp for entrance;Supervision due to cognitive  status     Functional Status Assessment   Patient has had a recent decline in their functional status and demonstrates the ability to make significant improvements in function in a reasonable and predictable amount of time.     Equipment Recommendations   BSC/3in1     Recommendations for Other Services   Rehab consult     Precautions/Restrictions   Precautions Precautions: Fall Recall of Precautions/Restrictions: Impaired Precaution/Restrictions Comments: BP <150 Restrictions Weight Bearing Restrictions Per Provider Order: No     Mobility Bed Mobility Overal bed mobility: Needs Assistance Bed Mobility: Rolling, Sidelying to Sit, Sit to Sidelying Rolling: Supervision Sidelying to sit: Min assist, +2 for safety/equipment Supine to sit: Min assist, +2 for safety/equipment     General bed mobility comments: increased time and cueing for technique    Transfers Overall transfer level: Needs assistance Equipment used: 2 person hand held assist Transfers: Sit to/from Stand Sit to Stand: Min assist, +2 physical assistance           General transfer comment: for safety, sway but no LOB.      Balance Overall balance assessment: Needs assistance Sitting-balance support: No upper extremity supported, Feet supported Sitting balance-Leahy Scale: Fair Sitting balance - Comments: min guard   Standing balance support: Bilateral upper extremity supported, During functional activity Standing balance-Leahy Scale: Poor Standing balance comment: relies on external support                           ADL either performed or assessed with clinical judgement   ADL Overall ADL's : Needs assistance/impaired     Grooming: Set up;Sitting           Upper Body Dressing : Contact guard assist;Sitting   Lower Body Dressing: Moderate assistance;Sit to/from stand  Toilet Transfer: Minimal assistance;+2 for physical assistance Toilet Transfer Details (indicate  cue type and reason): bil hand held support at EOB         Functional mobility during ADLs: Minimal assistance;+2 for physical assistance       Vision   Vision Assessment?: No apparent visual deficits     Perception         Praxis         Pertinent Vitals/Pain Pain Assessment Pain Assessment: Faces Faces Pain Scale: Hurts little more Pain Location: everywhere and back specifically Pain Descriptors / Indicators: Discomfort, Grimacing, Guarding Pain Intervention(s): Limited activity within patient's tolerance, Monitored during session, Repositioned     Extremity/Trunk Assessment Upper Extremity Assessment Upper Extremity Assessment: Generalized weakness   Lower Extremity Assessment Lower Extremity Assessment: Defer to PT evaluation   Cervical / Trunk Assessment Cervical / Trunk Assessment: Normal (back pain)   Communication Communication Communication: No apparent difficulties   Cognition Arousal: Alert Behavior During Therapy: Flat affect, Lability Cognition: No family/caregiver present to determine baseline             OT - Cognition Comments: patient alert, some slow processing and appears internally distracted at times.  he has decreased awareness of safety and requires cueing.  He is labile and reports feeling happy that he can stand.  Mildly confused but not formally assessed.                 Following commands: Impaired Following commands impaired: Follows one step commands with increased time, Follows multi-step commands inconsistently     Cueing  General Comments   Cueing Techniques: Verbal cues;Tactile cues  supine and VSS; BP 124/107 supine and 104/81 standing; asymptomatic   Exercises     Shoulder Instructions      Home Living Family/patient expects to be discharged to:: Private residence Living Arrangements: Spouse/significant other Available Help at Discharge: Family;Available 24 hours/day Type of Home: House Home Access:  Level entry     Home Layout: One level     Bathroom Shower/Tub: Chief Strategy Officer: Standard     Home Equipment: Cane - single point          Prior Functioning/Environment Prior Level of Function : Independent/Modified Independent;Working/employed             Mobility Comments: indep with no AD ADLs Comments: MOD I-I with ADL/IADL, works at [employer]    OT Problem List: Decreased strength;Decreased activity tolerance;Impaired balance (sitting and/or standing);Decreased knowledge of precautions;Decreased knowledge of use of DME or AE;Pain;Obesity;Decreased cognition;Decreased safety awareness   OT Treatment/Interventions: Self-care/ADL training;Therapeutic exercise;Balance training;Therapeutic activities;DME and/or AE instruction;Patient/family education;Energy conservation;Cognitive remediation/compensation      OT Goals(Current goals can be found in the care plan section)   Acute Rehab OT Goals Patient Stated Goal: keep moving OT Goal Formulation: With patient Time For Goal Achievement: 05/26/24 Potential to Achieve Goals: Good   OT Frequency:  Min 2X/week    Co-evaluation              AM-PAC OT 6 Clicks Daily Activity     Outcome Measure Help from another person eating meals?: A Little Help from another person taking care of personal grooming?: A Little Help from another person toileting, which includes using toliet, bedpan, or urinal?: A Lot Help from another person bathing (including washing, rinsing, drying)?: A Lot Help from another person to put on and taking off regular upper body clothing?: A Little Help from another person to  put on and taking off regular lower body clothing?: A Lot 6 Click Score: 15   End of Session Equipment Utilized During Treatment: Gait belt Nurse Communication: Mobility status;Precautions  Activity Tolerance: Patient tolerated treatment well Patient left: in bed;with call bell/phone within  reach;with bed alarm set  OT Visit Diagnosis: Muscle weakness (generalized) (M62.81);Other abnormalities of gait and mobility (R26.89);Pain Pain - Right/Left:  (back)                Time: 8865-8841 OT Time Calculation (min): 24 min Charges:  OT General Charges $OT Visit: 1 Visit OT Evaluation $OT Eval Moderate Complexity: 1 Mod  Etta NOVAK, OT Acute Rehabilitation Services Office [phone] Secure Chat Preferred    Etta GORMAN Hope 05/12/2024, 1:30 PM

## 2024-05-12 NOTE — Hospital Course (Addendum)
 Roger Black is a 68 year old male with a past medical history significant for HFrEF, lung cancer s/p radiation December 2024, COPD, CKD, and HTN presented to [facility] on 04/28/2024 with complaints of leg pain.  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  Patient then underwent right craniotomy on 10/16 by Dr. [name]. Postoperatively patient was admitted to ICU with PCCM consulted for medical management.  Sequence of events in the hospital  10/06 initially presented to [facility] for intractable  leg pain MRI lumbar spine revealed multilevel stenosis with enhancement of several nerve roots of the cauda equina without nodular masslike enhancements 10/7 neurosurgery evaluated with recommendations of LP, interventional radiology performed LP with approximately 20 mL clear colorless fluid obtained.  Opening pressure 22 closing pressure 9 10/8 meningitis/encephalitis panel negative Acyclovir and ampicillin discontinued 10/9 LP repeated per neurology recommendations, flow cytometry and cytology with cellblock preparation recommended 10/13 MRI brain completed and revealed circumscribed enhancing lesion in the right frontal lobe measuring 14 x 13 x 13 and cerebellar hemisphere 17 x 15 x 14 concerning for metastatic disease associated with vasogenic edema 10/15 transferred to [facility] for neurosurgery evaluation 10/16 underwent right craniotomy for resection of brain tumor per Dr. [name]   Right frontal lobe brain mass s/p right craniotomy for resection of tumor with Vasogenic edema Initial MRI brain  revealed circumscribed enhancing lesion in the right frontal lobe measuring 14 x 13 x 13 and cerebellar hemisphere 17 x 15 x 14 concerning for metastatic disease associated with vasogenic edema.  Seen by neurosurgery and underwent craniotomy with resection of tumor.  Has some  vasogenic edema.  On steroids.  Continue pain management, systolic blood pressure goal less than 150. 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  Continue Decadron 4 mg every 8 hourly. PRN Fentanyl - Continue scheduled Lyrica    History of COPD Continue bronchodilators   History  of CAD Continue statins ARB  Essential hypertension Continue irbesartan  Hyperlipidemia  Continue statin  HFrEF Review of recent 2D echocardiogram from August 2025 with EF of 45%.  Continue statins irbesartan   CKD stage IIIa Baseline Cr appears to be around 1.1-1.3.  Current creatinine at 1.1 and at baseline.  Continue to monitor.   Goals of care DNR.  Palliative care on board.  Debility deconditioning.  Patient has been seen by physical therapy and recommended CIR.

## 2024-05-12 NOTE — Progress Notes (Signed)
 Daily Progress Note   Patient Name: Roger Black       Date: 05/12/2024 DOB: February 27, 1956  Age: 68 y.o. MRN#: [id number] Attending Physician: Sonjia Held, MD Primary Care Physician: [name], MD Admit Date: 05/07/2024  Reason for Consultation/Follow-up: Establishing goals of care  Subjective: Medical records reviewed including progress notes, labs and imaging. Attempted to call patient's wife but was unable to reach. Left voicemail with PMT contact information.  Patient assessed at the bedside. He is eating lunch, reports his wife is on the way. She arrived and participated in the latter half of the conversation. Discussed with RN.  Patient did not recall role of PMT from yesterday's visit. I introduced Palliative Medicine as specialized medical care for people living with serious illness. It focuses on providing relief from the symptoms and stress of a serious illness. The goal is to improve quality of life for both the patient and the family.  Created space and opportunity for patient and family's thoughts and feelings on patient's current illness. Patient notes that his oncologist made out like everything was going to be good. He shares that his quality of life is good because I'm alive. He says it is bad that it is not supported by quality of life. Seems a bit confused. He has many friends with his motorcycle riding group and darts/drinking buddies. He worked as a Community education officer for 42 years and enjoys riding his trike, as well as caring for his yard. He lives in [place] and has 2 daughters and 1 son. He has been married to [place] for 6 years and they've been together for a total of 10. They have not made many discussions about his care preferences, though she understands from his conversations from other family that he  would never find it acceptable to be unable to speak, walk, or interact with others. He confirms his wishes for DNR. He is having a hard time remembering that he had a brain tumor and that this is the brain lesion he is referring to.   A detailed discussion regarding advanced directives was had. Patient's 2nd cousin is a Clinical research associate and they are trying to get his affairs in order. Offered assistance with HCPOA/Living Will. Patient ultimately wants to get radiation and shares I'm not quitting just yet. They are agreeable to further discussions tomorrow.    Discussed the importance of continued conversation with family and the medical providers regarding overall plan of care and treatment options, ensuring decisions are within the context of the patient's values and GOCs.   Questions and concerns were addressed.  Hard Choices booklet  left for review. The family was encouraged to call with questions or concerns.  PMT will continue to support holistically.  Length of Stay: 5   Physical Exam Vitals and nursing note reviewed.  Constitutional:      General: He is not in acute distress.    Interventions: Nasal cannula in place.  HENT:     Head: Normocephalic and atraumatic.     Comments: Craniotomy staples Cardiovascular:     Rate and Rhythm: Normal rate.  Pulmonary:     Effort: Pulmonary effort is normal.  Neurological:     Mental Status: He is alert.  Psychiatric:        Behavior: Behavior is cooperative.            Vital Signs: BP (!) 139/90 (BP Location: Left Arm)   Pulse 82   Temp 97.6 F (36.4 C) (Oral)   Resp 14   Ht 5' 9 (1.753 m)   SpO2 97%   BMI 31.22 kg/m  SpO2: SpO2: 97 % O2 Device: O2 Device: Room Air O2 Flow Rate: O2 Flow Rate (L/min): 2 L/min   Palliative Care Assessment & Plan   Patient Profile: 68 y.o. male  with past medical history of  68 year old male with a past medical history significant for HFrEF, lung cancer s/p radiation December 2024, COPD, CKD, and HTN  who initially presented to [facility] 04/28/24 for complaints of leg pain.  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  Patient was discharged from [facility] to [facility] for neurosurgery evaluation and management   Patient underwent right craniotomy for excisional biopsy of this right insular tumor per Dr. [name] on 10/16, postoperatively patient was admitted to ICU with PCCM consulted for medical management.    PMT has been consulted to assist with goals of care conversation. Patient/Family face treatment option decisions, advanced directive decisions and anticipatory care needs.    Family face treatment option decision, advance directive decisions and anticipatory care needs.   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 Goal is to pursue cancer treatment and attempt to resume some activities that bring him joy Ongoing GOC discussions. Patient would not want life prolonged by artificial support at end of life Psychosocial and emotional support provided PMT will continue to follow and support   Prognosis:  Poor long-term prognosis  Discharge Planning: To Be Determined  Care plan was discussed with patient, patient's wife, RN    Kailer Heindel SHAUNNA Fell, PA-C  Palliative Medicine Team Team phone # [phone]  Thank you for allowing the Palliative Medicine Team to assist in the care of this patient. Please utilize secure chat with additional questions, if there is no response within 30 minutes please call the above phone number.  Palliative Medicine Team providers are available by phone from 7am to 7pm daily and can be reached through the team cell phone.  Should this patient require  assistance outside of these hours, please call the patient's attending physician.    Time Total: 75  Visit consisted of counseling and education dealing with the complex and emotionally intense issues of symptom management and palliative care in the setting of serious and potentially life-threatening illness. Greater than 50% of this time was spent counseling and coordinating care related to the above assessment and  plan.  Personally spent 75 minutes in patient care including extensive chart review (labs, imaging, progress/consult notes, vital signs), medically appropraite exam, discussed with treatment team, education to patient, family, and staff, documenting clinical information, medication review and management, coordination of care, and available advanced directive documents.

## 2024-05-12 NOTE — Progress Notes (Addendum)
 PROGRESS NOTE  Roger Black FMW:[id number] DOB: 1955/08/30 DOA: 05/07/2024 PCP: [name], MD   LOS: 5 days   Brief narrative:  Roger Black is a 68 year old male with a past medical history significant for HFrEF, lung cancer s/p radiation December 2024, COPD, CKD, and HTN presented to [facility] on 04/28/2024 with complaints of leg pain.  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  Patient then underwent right craniotomy on 10/16 by Dr. [name]. Postoperatively patient was admitted to ICU with PCCM consulted for medical management.  Sequence of events in the hospital  10/06 initially presented to [facility] for intractable  leg pain MRI lumbar spine revealed multilevel stenosis with enhancement of several nerve roots of the cauda equina without nodular masslike enhancements 10/7 neurosurgery evaluated with recommendations of LP, interventional radiology performed LP with approximately 20 mL clear colorless fluid obtained.  Opening pressure 22 closing pressure 9 10/8 meningitis/encephalitis panel negative Acyclovir and ampicillin discontinued 10/9 LP repeated per neurology recommendations, flow cytometry and cytology with cellblock preparation recommended 10/13 MRI brain completed and revealed circumscribed enhancing lesion in the right frontal lobe measuring 14 x 13 x 13 and cerebellar hemisphere 17 x 15 x 14 concerning for metastatic disease associated with vasogenic edema 10/15 transferred to [facility] for neurosurgery evaluation 10/16 underwent right craniotomy for resection of brain tumor per Dr. [name]    Assessment/Plan: Principal Problem:   Brain mass Active Problems:   COPD (chronic obstructive pulmonary disease) (HCC)   HTN (hypertension)   Hyperlipidemia   Abnormal findings on diagnostic imaging of spine   Back pain   Brain  tumor (HCC)   Right frontal lobe brain mass s/p right craniotomy for resection of tumor with Vasogenic edema Initial MRI brain  revealed circumscribed enhancing lesion in the right frontal lobe measuring 14 x 13 x 13 and cerebellar hemisphere 17 x 15 x 14 concerning for metastatic disease associated with vasogenic edema.  Seen by neurosurgery and underwent craniotomy with resection of tumor.  Has some vasogenic edema.  On steroids.  Communicated with neurosurgery who indicated continuation of dexamethasone until radiation treatment.  Continue pain management, systolic blood pressure goal less than 150. Continue neuroprotective measures: ensure normoglycemia, normothermia, and normonatremia. Continue aspiration precautions.  Communicated with Dr. [name] radiation oncology at [facility] who indicated rehabitation first followed by [facility] to the brain lesions.  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  Continue Decadron 4 mg every 8 hourly. PRN Fentanyl - Continue scheduled Lyrica.  Still complains of severe pain.   History of COPD Continue bronchodilators   History  of CAD Continue statins ARB  Essential hypertension Continue irbesartan  Hyperlipidemia  Continue statin  HFrEF Review of recent 2D echocardiogram from August 2025 with EF of 45%.  Continue statins irbesartan   CKD stage IIIa Baseline Cr appears to be around 1.1-1.3.  Current creatinine at 1.1 and at baseline.  Continue to monitor.   Goals of care DNR.  Palliative care on board.  Debility deconditioning.  Patient has been seen by physical therapy and recommended CIR.  DVT prophylaxis: SCDs Start: 05/08/24 1110 SCDs Start: 05/07/24 0454   Disposition: CIR as per PT evaluation  Status  is: Inpatient Remains inpatient appropriate because: Need for CIR, pending clinical improvement    Code Status:     Code Status: Do not attempt resuscitation (DNR) PRE-ARREST INTERVENTIONS DESIRED  Family Communication: None at bedside, communicated with patient's spouse on the phone and updated her about the clinical condition of the patient  Consultants: Neurosurgery Palliative care CIR PCCM  Procedures: Right craniotomy and excisional biopsy of right insular tumor by neurosurgery on 05/08/2024.  Anti-infectives:  None  Anti-infectives (From admission, onward)    Start     Dose/Rate Route Frequency Ordered Stop   05/08/24 1400  ceFAZolin (ANCEF) IVPB 2g/100 mL premix        2 g 200 mL/hr over 30 Minutes Intravenous Every 8 hours 05/08/24 1110 05/09/24 0543   05/08/24 0715  ceFAZolin (ANCEF) IVPB 2g/100 mL premix        2 g 200 mL/hr over 30 Minutes Intravenous  Once 05/08/24 0706 05/08/24 0845        Subjective: Today, patient was seen and examined at bedside.  Patient described he has a lot of pain in the legs and feet.  Denies any headache, nausea, vomiting or visual blurriness Communicative.  Has not had a bowel movement.  Objective: Vitals:   05/12/24 1000 05/12/24 1100  BP: 127/77 (!) 137/99  Pulse: 89 98  Resp: 16 13  Temp:    SpO2: 95% (!) 87%    Intake/Output Summary (Last 24 hours) at 05/12/2024 1245 Last data filed at 05/12/2024 0845 Gross per 24 hour  Intake 100 ml  Output 800 ml  Net -700 ml   There were no vitals filed for this visit. Body mass index is 31.22 kg/m.   Physical Exam: GENERAL: Patient is alert awake and oriented. Not in obvious distress.  On nasal cannula oxygen  HENT: No scleral pallor or icterus. Pupils equally reactive to light. Oral mucosa is moist NECK: is supple, no gross swelling noted. CHEST: Clear to auscultation. No crackles or wheezes.  Diminished breath sounds bilaterally. CVS: S1 and S2 heard, no murmur. Regular  rate and rhythm.  ABDOMEN: Soft, non-tender, bowel sounds are present. EXTREMITIES: No edema. CNS: Cranial nerves are intact.  Moves extremities. SKIN: warm and dry without rashes.  Data Review: I have personally reviewed the following laboratory data and studies,  CBC: Recent Labs  Lab 05/07/24 0555 05/08/24 0133 05/08/24 0856 05/09/24 0514 05/10/24 0558 05/11/24 0621 05/12/24 0531  WBC 7.3 7.7  --  10.0 8.4 6.2 5.2  NEUTROABS 5.0  --   --   --   --   --   --   HGB 13.6 13.6 11.6* 12.9* 14.3 13.7 14.1  HCT 40.3 40.3 34.0* 38.6* 43.5 40.8 42.2  MCV 97.3 98.3  --  97.5 98.6 97.4 97.9  PLT 180 195  --  176 191 188 175   Basic Metabolic Panel: Recent Labs  Lab 05/07/24 0555 05/08/24 0133 05/08/24 0856 05/09/24 0514 05/10/24 0558 05/11/24 0621 05/12/24 0531  NA 132* 132* 131* 134* 137 134* 132*  K 4.9 5.1 4.2 4.6 4.5 4.7 4.7  CL 97* 97*  --  99 102 100 102  CO2 25 23  --  22 24 22  18*  GLUCOSE 124* 123*  --  136* 151* 139* 135*  BUN 25* 25*  --  21 30* 46* 47*  CREATININE 1.31* 1.19  --  1.06 1.18 1.32* 1.19  CALCIUM 9.5 9.4  --  9.5 9.8 9.6 9.5  MG 2.0 2.0  --   --  2.3 2.5* 2.3   Liver Function Tests: Recent Labs  Lab 05/07/24 0555  AST 21  ALT 19  ALKPHOS 73  BILITOT 0.9  PROT 6.8  ALBUMIN 3.8   No results for input(s): LIPASE, AMYLASE in the last 168 hours. No results for input(s): AMMONIA in the last 168 hours. Cardiac Enzymes: No results for input(s): CKTOTAL, CKMB, CKMBINDEX, TROPONINI in the last 168 hours. BNP (last 3 results) Recent Labs    04/28/24 1303  BNP 7.4    ProBNP (last 3 results) No results for input(s): PROBNP in the last 8760 hours.  CBG: No results for input(s): GLUCAP in the last 168 hours. Recent Results (from the past 240 hours)  Surgical pcr screen     Status: None   Collection Time: 05/07/24  7:34 PM   Specimen: Nasal Mucosa; Nasal Swab  Result Value Ref Range Status   MRSA, PCR NEGATIVE NEGATIVE  Final   Staphylococcus aureus NEGATIVE NEGATIVE Final    Comment: (NOTE) The Xpert SA Assay (FDA approved for NASAL specimens in patients 28 years of age and older), is one component of a comprehensive surveillance program. It is not intended to diagnose infection nor to guide or monitor treatment. Performed at [facility], [address] [place], [name] [address]   MRSA Next Gen by PCR, Nasal     Status: None   Collection Time: 05/08/24 11:38 AM   Specimen: Nasal Mucosa; Nasal Swab  Result Value Ref Range Status   MRSA by PCR Next Gen NOT DETECTED NOT DETECTED Final    Comment: (NOTE) The GeneXpert MRSA Assay (FDA approved for NASAL specimens only), is one component of a comprehensive MRSA colonization surveillance program. It is not intended to diagnose MRSA infection nor to guide or monitor treatment for MRSA infections. Test performance is not FDA approved in patients less than 48 years old. Performed at [facility], [address] [place], [name] [address]      Studies: No results found.    Davontay Watlington, MD  [facility] 05/12/2024  If 7PM-7AM, please contact night-coverage

## 2024-05-12 NOTE — Progress Notes (Signed)
 Inpatient Rehab Admissions Coordinator:   Consult received and chart reviewed.  Pt underwent brain tumor resection per Dr. [name] on 10/16.  Therapy evaluations complete.  Pt requiring max/total +2 for mobility.  Palliative attempted to meet with family yesterday to elicit GOC, but deferred till today.  MD checking with neurosurgery on plan for inpatient vs outpatient oncology consult.  Will follow.   Reche Lowers, PT, DPT Admissions Coordinator [phone] 05/12/24  1:14 PM

## 2024-05-13 ENCOUNTER — Ambulatory Visit: Admitting: Radiation Oncology

## 2024-05-13 DIAGNOSIS — G9389 Other specified disorders of brain: Secondary | ICD-10-CM | POA: Diagnosis not present

## 2024-05-13 LAB — CBC
HCT: 42.1 % (ref 39.0–52.0)
Hemoglobin: 14 g/dL (ref 13.0–17.0)
MCH: 32.4 pg (ref 26.0–34.0)
MCHC: 33.3 g/dL (ref 30.0–36.0)
MCV: 97.5 fL (ref 80.0–100.0)
Platelets: 187 K/uL (ref 150–400)
RBC: 4.32 MIL/uL (ref 4.22–5.81)
RDW: 12.3 % (ref 11.5–15.5)
WBC: 6.7 K/uL (ref 4.0–10.5)
nRBC: 0 % (ref 0.0–0.2)

## 2024-05-13 LAB — BASIC METABOLIC PANEL WITH GFR
Anion gap: 9 (ref 5–15)
BUN: 62 mg/dL — ABNORMAL HIGH (ref 8–23)
CO2: 22 mmol/L (ref 22–32)
Calcium: 9.2 mg/dL (ref 8.9–10.3)
Chloride: 102 mmol/L (ref 98–111)
Creatinine, Ser: 1.47 mg/dL — ABNORMAL HIGH (ref 0.61–1.24)
GFR, Estimated: 52 mL/min — ABNORMAL LOW (ref >60)
Glucose, Bld: 147 mg/dL — ABNORMAL HIGH (ref 70–99)
Potassium: 5 mmol/L (ref 3.5–5.1)
Sodium: 133 mmol/L — ABNORMAL LOW (ref 135–145)

## 2024-05-13 LAB — MAGNESIUM: Magnesium: 2.4 mg/dL (ref 1.7–2.4)

## 2024-05-13 MED ORDER — ENOXAPARIN SODIUM 40 MG/0.4ML IJ SOSY
40.0000 mg | PREFILLED_SYRINGE | Freq: Every day | INTRAMUSCULAR | Status: DC
  Administered 2024-05-13 – 2024-05-15 (×3): 40 mg via SUBCUTANEOUS
  Filled 2024-05-13 (×3): qty 0.4

## 2024-05-13 NOTE — Consult Note (Signed)
 Physical Medicine and Rehabilitation Consult Reason for Consult:impaired functional mobility, pain Referring Physician: Pokhrel    HPI: Roger Black is a 68 y.o. male with a history of lung cancer, COPD, CHF, CKD, HTN who presented on 10/6 with back and bilateral leg pain. 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 Brain MRI demonstrated circumscribed enhancing lesions in the right frontal lobe and left cerebral hemisphere concerning for metastatic disease. Pt was transferred to [facility] for right sided craniotomy for excisional bx of insular tumor by Dr. [name]. Palliative care consulted for goals of care. Pt complains of severe back and leg pain post-op, placed on lyrica. Cognitive deficits noted by team. He worked with therapies yesterday and was min assist for sit-std and ambulated 5 steps forward and backward with mod A (HHA).  He was min asssit for very simple ADL's. Pt livs with his wife in a one level home with level entry. He was independent prior to admit.     Home: Home Living Family/patient expects to be discharged to:: Private residence Living Arrangements: Spouse/significant other Available Help at Discharge: Family, Available 24 hours/day Type of Home: House Home Access: Level entry Home Layout: One level Bathroom Shower/Tub: Engineer, manufacturing systems: Standard Home Equipment: Cane - single point  Functional History: Prior Function Prior Level of Function : Independent/Modified Independent, Working/employed Mobility Comments: indep with no AD ADLs Comments: MOD I-I with ADL/IADL, works at [employer] Status:  Mobility: Bed Mobility Overal bed mobility: Needs Assistance Bed Mobility: Rolling, Sidelying to Sit, Sit to Sidelying Rolling: Supervision Sidelying to sit: Min assist, +2 for safety/equipment Supine to sit: Min assist, +2 for  safety/equipment Sit to supine: HOB elevated, Min assist General bed mobility comments: increased time and cueing for technique, minA for trunk elevation Transfers Overall transfer level: Needs assistance Equipment used: 2 person hand held assist Transfers: Sit to/from Stand Sit to Stand: Min assist, +2 physical assistance General transfer comment: minA to power up, pt with wide base of support, required significant time and 2 trials prior to standing Ambulation/Gait General Gait Details: pt took 5 steps forwards and backwards with bilat HHA at [facility] level prior to noted bilat knee instability and fatigue    ADL: ADL Overall ADL's : Needs assistance/impaired Grooming: Set up, Sitting Upper Body Dressing : Contact guard assist, Sitting Lower Body Dressing: Moderate assistance, Sit to/from stand Toilet Transfer: Minimal assistance, +2 for physical assistance Toilet Transfer Details (indicate cue type and reason): bil hand held support at EOB Functional mobility during ADLs: Minimal assistance, +2 for physical assistance  Cognition: Cognition Orientation Level: Oriented to person, Oriented to situation, Disoriented to place, Disoriented to time Cognition Arousal: Alert Behavior During Therapy: Flat affect, Lability   Review of Systems  Unable to perform ROS: Mental acuity  Musculoskeletal:  Positive for back pain.       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   vertex scalp, treating with 5FU/calcipotriene   Past Surgical History:  Procedure Laterality Date   APPLICATION OF CRANIAL NAVIGATION Right 05/08/2024   Procedure: COMPUTER-ASSISTED NAVIGATION, FOR CRANIAL PROCEDURE;  Surgeon: [name] Dino HERO, MD;  Location: [facility] OR;  Service: Neurosurgery;  Laterality: Right;   COLON SURGERY     COLONOSCOPY WITH PROPOFOL  N/A 10/20/2020   Procedure: COLONOSCOPY WITH PROPOFOL ;  Surgeon: Toledo, Ladell POUR, MD;  Location: [facility] ENDOSCOPY;  Service: Gastroenterology;  Laterality: N/A;   CRANIOTOMY Right 05/08/2024   Procedure: CRANIOTOMY TUMOR EXCISION;  Surgeon: [name] Dino HERO, MD;  Location: [facility] OR;  Service: Neurosurgery;  Laterality: Right;   Family History  Problem Relation Age of Onset   Hypertension Mother    Hypertension Father    Social History:  reports that he has quit smoking. His smoking use included cigarettes. He has a 47 pack-year smoking history. He has never used smokeless tobacco. He reports that he does not drink alcohol  and does not use drugs. Allergies: No Known Allergies Medications Prior to Admission  Medication Sig Dispense Refill   acetaminophen (TYLENOL) 325 MG tablet Take 2 tablets (650 mg total) by mouth every 6 (six) hours as needed for mild pain (pain score 1-3) or fever.     albuterol (VENTOLIN HFA) 108 (90 Base) MCG/ACT inhaler Inhale 2 puffs into the lungs every 4 (four) hours as needed for shortness of breath.     aspirin 81 MG EC tablet Take 81 mg by mouth daily.     atorvastatin (LIPITOR) 80 MG tablet Take 80 mg by mouth daily.     budesonide-formoterol (SYMBICORT) 160-4.5 MCG/ACT inhaler Inhale 2 puffs into the lungs 2 (two) times daily.     doxazosin (CARDURA) 2 MG tablet Take 2 mg by mouth at bedtime.     DULoxetine (CYMBALTA) 60 MG capsule Take 1 capsule (60 mg total) by mouth daily.     HYDROcodone-acetaminophen (NORCO/VICODIN) 5-325 MG tablet Take 1 tablet by mouth 2 (two)  times daily as needed for moderate pain (pain score 4-6).     methocarbamol (ROBAXIN) 500 MG tablet Take 1 tablet (500 mg total) by mouth 4 (four) times daily.     montelukast (SINGULAIR) 10 MG tablet Take 10 mg by mouth daily.     olmesartan (BENICAR) 40 MG tablet Take 40 mg by mouth daily.     ondansetron (ZOFRAN) 4 MG/2ML SOLN injection Inject 2 mLs (4 mg total) into the vein every 8 (eight) hours as needed for nausea or vomiting.     oxyCODONE-acetaminophen (PERCOCET/ROXICET) 5-325 MG tablet Take 1 tablet by mouth every 6 (six) hours.     pantoprazole (PROTONIX) 40 MG tablet Take 40 mg by mouth daily.     pregabalin (LYRICA) 150 MG capsule Take 1 capsule (150 mg total) by mouth 3 (three) times daily.     [Paused] spironolactone (ALDACTONE) 50 MG tablet Take 50 mg by mouth daily.     [Paused] torsemide (DEMADEX) 100 MG tablet Take 100 mg by mouth daily as needed (edema).     zolpidem (AMBIEN) 5 MG tablet Take 1 tablet (5 mg total) by mouth at bedtime as needed for sleep.     ipratropium (ATROVENT) 0.03 % nasal spray Place into the nose.     Morphine Sulfate (MORPHINE, PF,) 2 MG/ML injection Inject 1 mL (2 mg total) into the vein every  4 (four) hours as needed (breakthrough pain). (Patient not taking: Reported on 05/07/2024)       Blood pressure 104/67, pulse 85, temperature 97.8 F (36.6 C), temperature source Oral, resp. rate 10, height 5' 9 (1.753 m), SpO2 97%. Physical Exam Constitutional:      General: He is in acute distress.  HENT:     Right Ear: External ear normal.     Left Ear: External ear normal.     Mouth/Throat:     Mouth: Mucous membranes are moist.  Eyes:     Conjunctiva/sclera: Conjunctivae normal.  Cardiovascular:     Rate and Rhythm: Normal rate.     Pulses: Normal pulses.  Pulmonary:     Effort: Pulmonary effort is normal.  Abdominal:     Palpations: Abdomen is soft.  Musculoskeletal:        General: Tenderness present. No swelling.     Cervical back:  Normal range of motion.  Skin:    General: Skin is warm.     Comments: Right temporal-parietal incision CDI with staples  Neurological:     Mental Status: He is alert.     Comments: Pt is alert, oriented to self, [facility]. Told me where he lived. Could not tell me why he was here. Followed basic commands. Speech was fairly clear. Language was fairly fluent.  Was quite distracted, mostly due to pain. Settled down a bit after we talked for a few minutes. CN exam was non-focal. Seemed to move all 4's. MMT was difficult d/t poor attention. Sensed pain in all 4's. No abnl resting tone.   Psychiatric:     Comments: Anxious, distracted     Results for orders placed or performed during the hospital encounter of 05/07/24 (from the past 24 hours)  CBC     Status: None   Collection Time: 05/13/24  2:44 AM  Result Value Ref Range   WBC 6.7 4.0 - 10.5 K/uL   RBC 4.32 4.22 - 5.81 MIL/uL   Hemoglobin 14.0 13.0 - 17.0 g/dL   HCT 57.8 60.9 - 47.9 %   MCV 97.5 80.0 - 100.0 fL   MCH 32.4 26.0 - 34.0 pg   MCHC 33.3 30.0 - 36.0 g/dL   RDW 87.6 88.4 - 84.4 %   Platelets 187 150 - 400 K/uL   nRBC 0.0 0.0 - 0.2 %  Basic metabolic panel     Status: Abnormal   Collection Time: 05/13/24  2:44 AM  Result Value Ref Range   Sodium 133 (L) 135 - 145 mmol/L   Potassium 5.0 3.5 - 5.1 mmol/L   Chloride 102 98 - 111 mmol/L   CO2 22 22 - 32 mmol/L   Glucose, Bld 147 (H) 70 - 99 mg/dL   BUN 62 (H) 8 - 23 mg/dL   Creatinine, Ser 8.52 (H) 0.61 - 1.24 mg/dL   Calcium 9.2 8.9 - 89.6 mg/dL   GFR, Estimated 52 (L) >60 mL/min   Anion gap 9 5 - 15  Magnesium     Status: None   Collection Time: 05/13/24  2:44 AM  Result Value Ref Range   Magnesium 2.4 1.7 - 2.4 mg/dL   No results found.  Assessment/Plan: Diagnosis: 45 male with metastatic lung cancer to the brain s/p right insular excisional bx.  Does the need for close, 24 hr/day medical supervision in concert with the patient's rehab needs make it  unreasonable for this patient to be served in a less intensive setting? Yes Co-Morbidities  requiring supervision/potential complications:  -oncology planning -pain mgt -AKI -nutrition Due to bladder management, bowel management, safety, skin/wound care, disease management, medication administration, pain management, and patient education, does the patient require 24 hr/day rehab nursing? Yes Does the patient require coordinated care of a physician, rehab nurse, therapy disciplines of PT, OT, SLP to address physical and functional deficits in the context of the above medical diagnosis(es)? Yes Addressing deficits in the following areas: balance, endurance, locomotion, strength, transferring, bowel/bladder control, bathing, dressing, feeding, grooming, toileting, cognition, language, and psychosocial support Can the patient actively participate in an intensive therapy program of at least 3 hrs of therapy per day at least 5 days per week? Yes The potential for patient to make measurable gains while on inpatient rehab is excellent Anticipated functional outcomes upon discharge from inpatient rehab are supervision  with PT, supervision with OT, supervision with SLP. Estimated rehab length of stay to reach the above functional goals is: 10-15 days Anticipated discharge destination: Home Overall Rehab/Functional Prognosis: good  POST ACUTE RECOMMENDATIONS: This patient's condition is appropriate for continued rehabilitative care in the following setting: CIR Patient has agreed to participate in recommended program. N/A Note that insurance prior authorization may be required for reimbursement for recommended care.  Comment: Rehab Admissions Coordinator to follow up.      I have personally performed a face to face diagnostic evaluation of this patient. Additionally, I have examined the patient's medical record including any pertinent labs and radiographic images.    Thanks,  Arthea ONEIDA Gunther,  MD 05/13/2024

## 2024-05-13 NOTE — Progress Notes (Addendum)
 PROGRESS NOTE  Roger Black FMW:[id number] DOB: 05-18-1956 DOA: 05/07/2024 PCP: Roger [name], MD   LOS: 6 days   Brief narrative:  Roger Black is a 68 year old male with a past medical history significant for HFrEF, lung cancer s/p radiation December 2024, COPD, CKD, and HTN presented to [facility] on 04/28/2024 with complaints of leg pain.  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  Patient then underwent right craniotomy on 10/16 by Dr. [name]. Postoperatively patient was admitted to ICU with PCCM consulted for medical management.  Sequence of events in the hospital  10/06 initially presented to [facility] for intractable  leg pain MRI lumbar spine revealed multilevel stenosis with enhancement of several nerve roots of the cauda equina without nodular masslike enhancements 10/7 neurosurgery evaluated with recommendations of LP, interventional radiology performed LP with approximately 20 mL clear colorless fluid obtained.  Opening pressure 22 closing pressure 9 10/8 meningitis/encephalitis panel negative Acyclovir and ampicillin discontinued 10/9 LP repeated per neurology recommendations, flow cytometry and cytology with cellblock preparation recommended 10/13 MRI brain completed and revealed circumscribed enhancing lesion in the right frontal lobe measuring 14 x 13 x 13 and cerebellar hemisphere 17 x 15 x 14 concerning for metastatic disease associated with vasogenic edema 10/15 transferred to [facility] for neurosurgery evaluation 10/16 underwent right craniotomy for resection of brain tumor per Dr. [name] 10/21-consulting radiation oncology for leptomeningeal carcinomatosis,    Assessment/Plan: Principal Problem:   Brain mass Active Problems:   COPD (chronic obstructive pulmonary disease) (HCC)   HTN (hypertension)   Hyperlipidemia    Abnormal findings on diagnostic imaging of spine   Back pain   Brain tumor (HCC)   Right frontal lobe brain mass s/p right craniotomy for resection of tumor with Vasogenic edema Initial MRI brain  revealed circumscribed enhancing lesion in the right frontal lobe measuring 14 x 13 x 13 and cerebellar hemisphere 17 x 15 x 14 concerning for metastatic disease associated with vasogenic edema.  Seen by neurosurgery and underwent craniotomy with resection of tumor.  Has some vasogenic edema.  On steroids.  Communicated with neurosurgery who indicated continuation of dexamethasone initiation of further treatment including possible radiation..  Continue pain management, Continue neuroprotective measures: ensure normoglycemia, normothermia, and normonatremia.  systolic blood pressure goal less than 150.Continue aspiration precautions.  Communicated with Dr. [name] radiation oncology at [facility] on 05/12/2024 who indicated rehabitation first followed by [facility] to the brain lesions.  He wishes to have outpatient follow-up after rehabitation.  Communicated with neurosurgery and oncology again today and plan is to have in hospital radiation oncology weigh in the findings to see if patient would benefit from radiation treatment.  Did speak with Dr. Shannon, Rad oncology.  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  Continue Decadron 4 mg every 8 hourly.  Does not complain of headache but still complains of leg pain   History of COPD Continue bronchodilators   History  of CAD Continue statins ARB  Essential hypertension Continue irbesartan  Hyperlipidemia  Continue statin  HFrEF Review of recent 2D echocardiogram from August 2025 with EF of 45%.  Continue statins, irbesartan.  Appears compensated at this time   CKD stage IIIa Baseline Cr appears to  be around 1.1-1.3.  Current creatinine at  1.4 and at baseline.  Continue to monitor.   Goals of care DNR.  Palliative care on board.  Debility deconditioning.  Patient has been seen by physical therapy and recommended CIR.  DVT prophylaxis: enoxaparin (LOVENOX) injection 40 mg Start: 05/13/24 1430 SCDs Start: 05/08/24 1110 SCDs Start: 05/07/24 0454   Disposition: CIR as per PT evaluation   Status is: Inpatient Remains inpatient appropriate because: Need for CIR, pending clinical improvement with pain, radiation oncology opinion on further treatment plan.    Code Status:     Code Status: Do not attempt resuscitation (DNR) PRE-ARREST INTERVENTIONS DESIRED  Family Communication: Spoke with the patient's spouse on the phone and updated her about the clinical condition of the patient on 05/12/2024  Consultants: Neurosurgery Palliative care CIR PCCM Radiation oncology  Procedures: Right craniotomy and excisional biopsy of right insular tumor by neurosurgery on 05/08/2024.  Anti-infectives:  None  Subjective: Today, patient was seen and examined at bedside.  Patient still complains of leg pain but denies any headache, nausea, vomiting, visual disturbances.  No trouble urinating or bowel movement   Objective: Vitals:   05/13/24 1055 05/13/24 1100  BP: 113/86 (!) 148/115  Pulse: 99 (!) 109  Resp:    Temp:    SpO2: 95% 99%    Intake/Output Summary (Last 24 hours) at 05/13/2024 1333 Last data filed at 05/13/2024 1100 Gross per 24 hour  Intake --  Output 1000 ml  Net -1000 ml   There were no vitals filed for this visit. Body mass index is 31.22 kg/m.   Physical Exam: General: Obese built, not in obvious distress, HENT:   No scleral pallor or icterus noted. Oral mucosa is moist.  Chest:  Clear breath sounds.  Diminished breath sounds bilaterally. No crackles or wheezes.  CVS: S1 &S2 heard. No murmur.  Regular rate and rhythm. Abdomen: Soft, nontender,  nondistended.  Bowel sounds are heard.   Extremities: No cyanosis, clubbing or edema.  Peripheral pulses are palpable. Psych: Alert, awake and oriented, normal mood CNS:  No cranial nerve deficits moves extremities Skin: Warm and dry.  No rashes noted.  Data Review: I have personally reviewed the following laboratory data and studies,  CBC: Recent Labs  Lab 05/07/24 0555 05/08/24 0133 05/09/24 0514 05/10/24 0558 05/11/24 0621 05/12/24 0531 05/13/24 0244  WBC 7.3   < > 10.0 8.4 6.2 5.2 6.7  NEUTROABS 5.0  --   --   --   --   --   --   HGB 13.6   < > 12.9* 14.3 13.7 14.1 14.0  HCT 40.3   < > 38.6* 43.5 40.8 42.2 42.1  MCV 97.3   < > 97.5 98.6 97.4 97.9 97.5  PLT 180   < > 176 191 188 175 187   < > = values in this interval not displayed.   Basic Metabolic Panel: Recent Labs  Lab 05/08/24 0133 05/08/24 0856 05/09/24 0514 05/10/24 0558 05/11/24 0621 05/12/24 0531 05/13/24 0244  NA 132*   < > 134* 137 134* 132* 133*  K 5.1   < > 4.6 4.5 4.7 4.7 5.0  CL 97*  --  99 102 100 102 102  CO2 23  --  22 24 22  18* 22  GLUCOSE 123*  --  136* 151* 139* 135* 147*  BUN 25*  --  21 30* 46* 47* 62*  CREATININE 1.19  --  1.06 1.18 1.32* 1.19 1.47*  CALCIUM 9.4  --  9.5 9.8 9.6 9.5  9.2  MG 2.0  --   --  2.3 2.5* 2.3 2.4   < > = values in this interval not displayed.   Liver Function Tests: Recent Labs  Lab 05/07/24 0555  AST 21  ALT 19  ALKPHOS 73  BILITOT 0.9  PROT 6.8  ALBUMIN 3.8   No results for input(s): LIPASE, AMYLASE in the last 168 hours. No results for input(s): AMMONIA in the last 168 hours. Cardiac Enzymes: No results for input(s): CKTOTAL, CKMB, CKMBINDEX, TROPONINI in the last 168 hours. BNP (last 3 results) Recent Labs    04/28/24 1303  BNP 7.4    ProBNP (last 3 results) No results for input(s): PROBNP in the last 8760 hours.  CBG: No results for input(s): GLUCAP in the last 168 hours. Recent Results (from the past 240 hours)   Surgical pcr screen     Status: None   Collection Time: 05/07/24  7:34 PM   Specimen: Nasal Mucosa; Nasal Swab  Result Value Ref Range Status   MRSA, PCR NEGATIVE NEGATIVE Final   Staphylococcus aureus NEGATIVE NEGATIVE Final    Comment: (NOTE) The Xpert SA Assay (FDA approved for NASAL specimens in patients 37 years of age and older), is one component of a comprehensive surveillance program. It is not intended to diagnose infection nor to guide or monitor treatment. Performed at [facility], [address], [place], [name] [address]   MRSA Next Gen by PCR, Nasal     Status: None   Collection Time: 05/08/24 11:38 AM   Specimen: Nasal Mucosa; Nasal Swab  Result Value Ref Range Status   MRSA by PCR Next Gen NOT DETECTED NOT DETECTED Final    Comment: (NOTE) The GeneXpert MRSA Assay (FDA approved for NASAL specimens only), is one component of a comprehensive MRSA colonization surveillance program. It is not intended to diagnose MRSA infection nor to guide or monitor treatment for MRSA infections. Test performance is not FDA approved in patients less than 72 years old. Performed at [facility], [address], [place], [name] [address]      Studies: No results found.    Vernal Alstrom, MD  [facility] 05/13/2024  If 7PM-7AM, please contact night-coverage

## 2024-05-13 NOTE — Progress Notes (Signed)
 Inpatient Rehab Admissions Coordinator:   Stopped to meet with pt.  Spouse on the phone and coming to visit so will try to meet with them both in person at bedside today.   Reche Lowers, PT, DPT Admissions Coordinator [phone] 05/13/24  11:16 AM

## 2024-05-13 NOTE — Progress Notes (Signed)
 Daily Progress Note   Patient Name: Roger Black       Date: 05/13/2024 DOB: 09-Sep-1955  Age: 68 y.o. MRN#: [id number] Attending Physician: Sonjia Held, MD Primary Care Physician: Roger [name], MD Admit Date: 05/07/2024  Reason for Consultation/Follow-up: Establishing goals of care  Subjective: Medical records reviewed including progress notes, labs and imaging. Patient assessed at the bedside. He called his wife to make sure that she was on the way. He states that his pain is usually at night, 5/10 and described as tense and tingling. He is still having a hard time making sense of his situation and how his doctors are working together. He has not read Hard Choices booklet yet but asked me to send a digital copy to [email or web address].  Created space and opportunity for patient and family's thoughts and feelings on patient's current illness. Reviewed patient's goals of care - he would like to ride his motorcycle, work again at least part-time, and play darts again. His wife and I shared our concern about the driving after his brain tumor resection, though I encouraged him to discuss further with his radiation oncologist.  His wife shares that she has been in touch with his employer and they are very understanding of his situation; she is worried that he likely would not be able to work.    We also discussed the possible symptoms to expect as his disease and leptomeningeal carcinomatosis impacts his quality of life, emphasized the importance of continued goals of care discussions depending on his symptom burden and the quality of life he may or may not find acceptable.  He confirms his goal is to proceed with rehabilitation and oncology evaluation for next steps.  Outpatient palliative care was explained and offered.  Patient and wife  agreeable.  Emotional support therapeutic listening was provided.  Questions and concerns were addressed.  Hard Choices booklet left for review. The family was encouraged to call with questions or concerns.  PMT will continue to support holistically.  Length of Stay: 6   Physical Exam Vitals and nursing note reviewed.  Constitutional:      General: He is not in acute distress.    Interventions: Nasal cannula in place.  HENT:     Head: Normocephalic and atraumatic.     Comments: Craniotomy staples Cardiovascular:     Rate and Rhythm: Normal rate.  Pulmonary:     Effort: Pulmonary effort is normal.  Neurological:     Mental Status: He is alert.  Psychiatric:        Behavior: Behavior is cooperative.  Vital Signs: BP (!) 153/108   Pulse 92   Temp 97.8 F (36.6 C) (Oral)   Resp 19   Ht 5' 9 (1.753 m)   SpO2 94%   BMI 31.22 kg/m  SpO2: SpO2: 94 % O2 Device: O2 Device: Room Air O2 Flow Rate: O2 Flow Rate (L/min): 2 L/min   Palliative Care Assessment & Plan   Patient Profile: 68 y.o. male  with past medical history of  68 year old male with a past medical history significant for HFrEF, lung cancer s/p radiation December 2024, COPD, CKD, and HTN who initially presented to [facility] 04/28/24 for complaints of leg pain.  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  Patient was discharged from [facility] to [facility] for neurosurgery evaluation and management   Patient underwent right craniotomy for excisional biopsy of this right insular tumor per Dr. [name] on 10/16, postoperatively patient was admitted to ICU with PCCM consulted for medical management.    PMT has been consulted to assist with goals of care conversation. Patient/Family face treatment option decisions, advanced directive decisions and anticipatory care needs.    Family face treatment option decision,  advance directive decisions and anticipatory care needs.   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 Goal is to pursue cancer treatment and attempt to resume some activities that bring him joy Patient agreeable to outpatient palliative care referral.  Placed referral to palliative clinic in the cancer center at [facility] Psychosocial and emotional support provided PMT will continue to follow and support as needed   Prognosis:  Poor long-term prognosis  Discharge Planning: To Be Determined  Care plan was discussed with patient, patient's wife    Daleen Steinhaus SHAUNNA Fell, PA-C  Palliative Medicine Team Team phone # [phone]  Thank you for allowing the Palliative Medicine Team to assist in the care of this patient. Please utilize secure chat with additional questions, if there is no response within 30 minutes please call the above phone number.  Palliative Medicine Team providers are available by phone from 7am to 7pm daily and can be reached through the team cell phone.  Should this patient require assistance outside of these hours, please call the patient's attending physician.    Time Total: 65  Visit consisted of counseling and education dealing with the complex and emotionally intense issues of symptom management and palliative care in the setting of serious and potentially life-threatening illness. Greater than 50% of this time was spent counseling and coordinating care related to the above assessment and plan.  Personally spent 65 minutes in patient care including extensive chart review (labs, imaging, progress/consult notes, vital signs), medically appropraite exam, discussed with treatment team, education to patient, family, and staff, documenting clinical information,  medication review and management, coordination of care, and available advanced directive documents.

## 2024-05-13 NOTE — Progress Notes (Signed)
 Inpatient Rehab Coordinator Note:  I met with pt and his spouse at bedside to discuss CIR recommendations and goals/expectations of CIR stay.  We reviewed 3 hrs/day of therapy, physician follow up, and average length of stay 2 weeks (dependent upon progress) with goals of supervision.  Pt's spouse confirms she is home 24/7 and can provide supervision.  Also confirms plan to pursue outpatient oncology workup after rehab stay.  We discussed prior auth process and pt confirms [employer] as primary payor.  I will start auth request process today.    Reche Lowers, PT, DPT Admissions Coordinator [phone] 05/13/24  2:33 PM

## 2024-05-13 NOTE — Progress Notes (Signed)
 Physical Therapy Treatment Patient Details Name: Roger Black MRN: [id number] DOB: 1956-06-28 Today's Date: 05/13/2024   History of Present Illness Pt is a 68 y.o. male who presented 04/28/24 with back and leg pain. 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 S/p LP 10/7 & 10/9, which showed atypical cells with predominant lymphocytes but was not conclusive for malignancy. Brain MRI showed circumscribed enhancing lesions in the right frontal lobe and left cerebral hemisphere concerning for metastatic disease. Transferred to [facility] from [facility] 10/14. S/p R crani for resection of brain tumor 10/16. PMH: asthma, arthritis, aortic atherosclerosis, COPD, CHF, CKD stage 3a, CAD, HTN, HLD, sleep apnea, squamous cell carcinoma in situ    PT Comments  Pt alert and oriented however noted confusion, decreased insight to deficits and safety, as well as delayed processing and difficulty sequencing. Pt easily distracted requiring constant verbal cues to stay on task. Pt requiring min/modA for transfers and amb with RW. Continue to recommend inpatient rehab program > 3 hrs a day to address both cognitive and functional deficits for safe transition home with family. Acute PT to cont to follow.   If plan is discharge home, recommend the following: Two people to help with walking and/or transfers;A lot of help with bathing/dressing/bathroom;Assistance with cooking/housework;Direct supervision/assist for medications management;Direct supervision/assist for financial management;Assist for transportation;Help with stairs or ramp for entrance;Supervision due to cognitive status   Can travel by private vehicle        Equipment Recommendations  Rolling walker (2 wheels);BSC/3in1;Wheelchair (measurements PT);Wheelchair cushion (measurements PT);Hospital bed (pending progress)     Recommendations for Other Services Rehab consult     Precautions / Restrictions Precautions Precautions: Fall Recall of Precautions/Restrictions: Impaired Precaution/Restrictions Comments: BP <150 Restrictions Weight Bearing Restrictions Per Provider Order: No     Mobility  Bed Mobility Overal bed mobility: Needs Assistance Bed Mobility: Rolling, Sidelying to Sit, Sit to Sidelying Rolling: Supervision Sidelying to sit: Min assist, +2 for safety/equipment       General bed mobility comments: increased time and cueing for technique, minA for trunk elevation    Transfers Overall transfer level: Needs assistance Equipment used: Rolling walker (2 wheels) Transfers: Sit to/from Stand Sit to Stand: Min assist, +2 physical assistance   Step pivot transfers: Mod assist, +2 safety/equipment       General transfer comment: minA to power up, pt with wide base of support, required significant time and 2 trials prior to standing, modA for step pvt from recliner to [facility] via [facility]HA, max directional verbal cues    Ambulation/Gait Ambulation/Gait assistance: Min assist, +2 safety/equipment Gait Distance (Feet): 30 Feet Assistive device: Rolling walker (2 wheels) Gait Pattern/deviations: Decreased stride length, Wide base of support, Staggering right, Staggering left Gait velocity: decreased Gait velocity interpretation: <1.31 ft/sec, indicative of household ambulator   General Gait Details: pt with noted lateral sway L/R and ant/posterior, pt became more confused/delayed response however VSS, pt stating Im just trying to figure out what I'm doing, minA for walker management and 2nd person for chair follow and line management   Stairs             Wheelchair Mobility     Tilt Bed    Modified Rankin (Stroke Patients Only)       Balance Overall balance assessment: Needs assistance Sitting-balance support: No upper extremity supported, Feet supported Sitting  balance-Leahy Scale: Fair Sitting balance - Comments:  min guard   Standing balance support: Bilateral upper extremity supported, During functional activity Standing balance-Leahy Scale: Poor Standing balance comment: relies on external support                            Communication Communication Communication: No apparent difficulties  Cognition Arousal: Alert Behavior During Therapy: Lability   PT - Cognitive impairments: Difficult to assess, Awareness, Attention, Memory, Initiation, Sequencing, Problem solving, Safety/Judgement                       PT - Cognition Comments: pt oriented but confused. Pt becoming inappropriate at times requiring re-direction. Pt required constant verbal cues to stay on task, easily distracted both internally and externally Following commands: Impaired Following commands impaired: Follows one step commands with increased time, Follows multi-step commands inconsistently    Cueing Cueing Techniques: Verbal cues, Tactile cues  Exercises      General Comments General comments (skin integrity, edema, etc.): VSS      Pertinent Vitals/Pain Pain Assessment Pain Assessment: Faces Faces Pain Scale: Hurts a little bit Pain Location: back Pain Descriptors / Indicators: Discomfort, Grimacing, Guarding    Home Living                          Prior Function            PT Goals (current goals can now be found in the care plan section) Acute Rehab PT Goals PT Goal Formulation: With patient/family Time For Goal Achievement: 05/24/24 Potential to Achieve Goals: Fair Progress towards PT goals: Progressing toward goals    Frequency    Min 3X/week      PT Plan      Co-evaluation              AM-PAC PT 6 Clicks Mobility   Outcome Measure  Help needed turning from your back to your side while in a flat bed without using bedrails?: A Little Help needed moving from lying on your back to sitting on the  side of a flat bed without using bedrails?: A Little Help needed moving to and from a bed to a chair (including a wheelchair)?: A Lot Help needed standing up from a chair using your arms (e.g., wheelchair or bedside chair)?: A Lot Help needed to walk in hospital room?: A Lot Help needed climbing 3-5 steps with a railing? : Total 6 Click Score: 13    End of Session Equipment Utilized During Treatment: Gait belt Activity Tolerance: Patient limited by lethargy Patient left: in bed;with call bell/phone within reach;with bed alarm set;with family/visitor present Nurse Communication: Mobility status PT Visit Diagnosis: Unsteadiness on feet (R26.81);Other abnormalities of gait and mobility (R26.89);Muscle weakness (generalized) (M62.81);Difficulty in walking, not elsewhere classified (R26.2);Other symptoms and signs involving the nervous system (R29.898);Pain Pain - Right/Left:  (bil) Pain - part of body: Leg (back)     Time: 8965-8940 PT Time Calculation (min) (ACUTE ONLY): 25 min  Charges:    $Gait Training: 8-22 mins $Therapeutic Activity: 8-22 mins PT General Charges $$ ACUTE PT VISIT: 1 Visit                     Norene Ames, PT, DPT Acute Rehabilitation Services Secure chat preferred Office #: [phone]    Norene CHRISTELLA Ames 05/13/2024, 11:53 AM

## 2024-05-14 ENCOUNTER — Encounter (HOSPITAL_COMMUNITY): Payer: Self-pay | Admitting: Pulmonary Disease

## 2024-05-14 ENCOUNTER — Other Ambulatory Visit: Payer: Self-pay | Admitting: Radiation Therapy

## 2024-05-14 ENCOUNTER — Ambulatory Visit: Attending: Radiation Oncology | Admitting: Radiation Oncology

## 2024-05-14 DIAGNOSIS — C3412 Malignant neoplasm of upper lobe, left bronchus or lung: Secondary | ICD-10-CM | POA: Insufficient documentation

## 2024-05-14 DIAGNOSIS — Z510 Encounter for antineoplastic radiation therapy: Secondary | ICD-10-CM | POA: Insufficient documentation

## 2024-05-14 DIAGNOSIS — C7931 Secondary malignant neoplasm of brain: Secondary | ICD-10-CM | POA: Insufficient documentation

## 2024-05-14 DIAGNOSIS — Z87891 Personal history of nicotine dependence: Secondary | ICD-10-CM | POA: Insufficient documentation

## 2024-05-14 DIAGNOSIS — C7951 Secondary malignant neoplasm of bone: Secondary | ICD-10-CM | POA: Insufficient documentation

## 2024-05-14 LAB — BASIC METABOLIC PANEL WITH GFR
Anion gap: 9 (ref 5–15)
BUN: 61 mg/dL — ABNORMAL HIGH (ref 8–23)
CO2: 21 mmol/L — ABNORMAL LOW (ref 22–32)
Calcium: 9.2 mg/dL (ref 8.9–10.3)
Chloride: 101 mmol/L (ref 98–111)
Creatinine, Ser: 1.38 mg/dL — ABNORMAL HIGH (ref 0.61–1.24)
GFR, Estimated: 56 mL/min — ABNORMAL LOW (ref >60)
Glucose, Bld: 117 mg/dL — ABNORMAL HIGH (ref 70–99)
Potassium: 5 mmol/L (ref 3.5–5.1)
Sodium: 131 mmol/L — ABNORMAL LOW (ref 135–145)

## 2024-05-14 LAB — CBC
HCT: 42.3 % (ref 39.0–52.0)
Hemoglobin: 14.1 g/dL (ref 13.0–17.0)
MCH: 32.6 pg (ref 26.0–34.0)
MCHC: 33.3 g/dL (ref 30.0–36.0)
MCV: 97.9 fL (ref 80.0–100.0)
Platelets: 186 K/uL (ref 150–400)
RBC: 4.32 MIL/uL (ref 4.22–5.81)
RDW: 12.2 % (ref 11.5–15.5)
WBC: 6.9 K/uL (ref 4.0–10.5)
nRBC: 0 % (ref 0.0–0.2)

## 2024-05-14 NOTE — Consult Note (Signed)
 Radiation Oncology         [phone] ________________________________  Name: Roger Black        MRN: [id number]  Date of Service: 05/13/24                        DOB: May 23, 1956  RR:Xozpw, [name], MD  [name], MD     REFERRING PHYSICIAN: [name], MD   DIAGNOSIS: The primary encounter diagnosis was Metastasis to brain [name]). Diagnoses of Brain tumor [facility]), Brain mass, and Malignant neoplasm of upper lobe of left lung [facility]) were also pertinent to this visit.   HISTORY OF PRESENT ILLNESS: Roger Black is a 68 y.o. male seen at the request of Dr. Lenon with the hospitalist service for new brain and leptomeningeal disease. The patient was diagnosed with putative Stage IA2, cT1bN0M0, NSCLC of the LUL. The lesion was not felt to be accessible for bronchoscopy so he was treated definitively after his clinical work up with stereotactic body radiotherapy (SBRT) with Dr. [name] in [place]. He presented on 04/28/24 with 2 months of leg pain and weakness and an outpatient MRI of the lumbar spine on 04/25/24 showed enhancement of several nerve roots of the cauda equina and he was encouraged to  present to the ER, and went to [facility].  He was admitted and additional work up with lumbar puncture on 04/29/24 showed atypical cells, and culture did not see any growth at 3 days. A second LP on 05/01/24 showed admixed white and red blood cells. Neither culture showed fungal growth and his encephalitis panel was negative.  Additional workup included an MRI of the brain on 05/05/2024 which showed a 14 mm lesion in the right frontal lobe and a 17 mm lesion in the left cerebellum.  Moderate vasogenic edema was noted of both sites without significant mass effect.  A CT chest abdomen pelvis on 05/06/2024 that showed a slight decrease in the known left apical pulmonary nodule that had been treated previously and appeared to have changes consistent with a response to  therapy.  No other disease was noted in the chest abdomen or pelvis.  A bone scan on 05/06/2024 showed no evidence of osseous disease.  Without a focal area to sample, he was counseled on craniotomy and biopsy of the lesions in the brain.  Dr. [name] performed this on 05/08/2024.  Final pathology shows metastatic carcinoma and the tumor biopsies.  Nonspecific staining was noted but overall this was felt to be consistent with a metastatic carcinoma most likely of of lung origin.  He has been recovering at [name] since his surgery.  Postop CT head on 05/09/2024 showed postoperative changes with trace postoperative extra-axial hemorrhage overlying the right cerebral calvarium convexity without significant mass effect.  Given the concerns for his brain disease and involvement of the cord, his primary medical team reached out to our department to consider a palliative course of radiation.    PREVIOUS RADIATION THERAPY: Yes  06/12/23-06/28/23 The LUL tumor was treated to 60 Gy in 5 fractions, 12 Gy per fraction by Dr. [name].   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   vertex scalp, treating with 5FU/calcipotriene       PAST SURGICAL HISTORY: Past Surgical History:  Procedure Laterality Date   APPLICATION OF CRANIAL NAVIGATION Right 05/08/2024   Procedure: COMPUTER-ASSISTED NAVIGATION, FOR CRANIAL PROCEDURE;  Surgeon: [name] Dino HERO,  MD;  Location: [facility] OR;  Service: Neurosurgery;  Laterality: Right;   COLON SURGERY     COLONOSCOPY WITH PROPOFOL  N/A 10/20/2020   Procedure: COLONOSCOPY WITH PROPOFOL ;  Surgeon: Toledo, Ladell POUR, MD;  Location: [facility] ENDOSCOPY;  Service: Gastroenterology;  Laterality: N/A;   CRANIOTOMY Right 05/08/2024   Procedure: CRANIOTOMY TUMOR EXCISION;  Surgeon: [name] Dino HERO, MD;  Location[facility] OR;  Service: Neurosurgery;  Laterality: Right;     FAMILY HISTORY:  Family History  Problem Relation Age of Onset   Hypertension Mother    Hypertension Father      SOCIAL HISTORY:  reports that he has quit smoking. His smoking use included cigarettes. He has a 47 pack-year smoking history. He has never used smokeless tobacco. He reports that he does not drink alcohol  and does not use drugs. The patient is married and lives in [place]. He works for a Programme researcher, broadcasting/film/video.    ALLERGIES: Patient has no known allergies.   MEDICATIONS:  Current Facility-Administered Medications  Medication Dose Route Frequency Provider Last Rate Last Admin   acetaminophen (TYLENOL) tablet 650 mg  650 mg Oral Q6H PRN [name], [name], MD   650 mg at 05/14/24 0125   Or   acetaminophen (TYLENOL) suppository 650 mg  650 mg Rectal Q6H PRN [name], MD       atorvastatin (LIPITOR) tablet 80 mg  80 mg Oral Daily [name], [name], MD   80 mg at 05/13/24 0946   baclofen (LIORESAL) tablet 10 mg  10 mg Oral BID [name], [name], MD   10 mg at 05/13/24 2245   butalbital-acetaminophen-caffeine (FIORICET) 50-325-40 MG per tablet 2 tablet  2 tablet Oral Q4H PRN [name], [name], MD   2 tablet at 05/11/24 1854   Chlorhexidine Gluconate Cloth 2 % PADS 6 each  6 each Topical Q0600 [name], [name], MD   6 each at 05/13/24 0947   dexamethasone (DECADRON) tablet 4 mg  4 mg Oral Q8H [name], [name], MD   4 mg at 05/14/24 0552   doxazosin (CARDURA) tablet 2 mg  2 mg Oral QHS [name], [name], MD   2 mg at 05/13/24 2245    DULoxetine (CYMBALTA) DR capsule 60 mg  60 mg Oral Daily [name], [name], MD   60 mg at 05/13/24 0946   enoxaparin (LOVENOX) injection 40 mg  40 mg Subcutaneous Daily Pokhrel, Laxman, MD   40 mg at 05/13/24 1508   fluticasone furoate-vilanterol (BREO ELLIPTA) 100-25 MCG/ACT 1 puff  1 puff Inhalation Daily [name], MD   1 puff at 05/13/24 0932   irbesartan (AVAPRO) tablet 300 mg  300 mg Oral Daily [name], [name], MD   300 mg at 05/13/24 0946   labetalol (NORMODYNE) injection 10-40 mg  10-40 mg Intravenous Q10 min PRN [name], [name], MD   20 mg at 05/09/24 1951   levETIRAcetam (KEPPRA) tablet  500 mg  500 mg Oral BID Wilson, Tara N, PA-C   500 mg at 05/13/24 2245   montelukast (SINGULAIR) tablet 10 mg  10 mg Oral Daily [name], [name], MD   10 mg at 05/13/24 0946   ondansetron (ZOFRAN) tablet 4 mg  4 mg Oral Q4H PRN [name], [name], MD       Or   ondansetron (ZOFRAN) injection 4 mg  4 mg Intravenous Q4H PRN [name], [name], MD       Oral care mouth rinse  15 mL Mouth Rinse PRN Desai, Nikita S, MD       oxyCODONE (Oxy IR/ROXICODONE) immediate release tablet 5 mg  5 mg Oral Q4H PRN Desai, Nikita S, MD   5 mg at 05/13/24 2246   pantoprazole (PROTONIX) EC tablet 40 mg  40 mg Oral Daily [name], [name], MD   40 mg at 05/13/24 0946   polyethylene glycol (MIRALAX / GLYCOLAX) packet 17 g  17 g Oral BID Pokhrel, Laxman, MD   17 g at 05/13/24 2246   pregabalin (LYRICA) capsule 150 mg  150 mg Oral TID [name], MD   150 mg at 05/13/24 2245   promethazine (PHENERGAN) tablet 12.5-25 mg  12.5-25 mg Oral Q4H PRN [name], [name], MD       senna-docusate (Senokot-S) tablet 2 tablet  2 tablet Oral BID Wilson, Tara N, PA-C   2 tablet at 05/13/24 2245   white petrolatum (VASELINE) gel   Topical PRN Desai, Nikita S, MD       zolpidem (AMBIEN) tablet 5 mg  5 mg Oral QHS PRN [name], MD   5 mg at 05/12/24 2318     REVIEW OF SYSTEMS: On review of systems,  the patient reports that he is hopeful to get better and become more independent. He enjoys riding his harley motorcycle and has goals getting back to this. He describes pain his legs and weakness continue, but he was able to get up and take a few steps today with PT. He describes morphine and muscle relaxers are the only way he's able to find some relief with his leg pain. No other complaints are verbalized.   PHYSICAL EXAM:  Wt Readings from Last 3 Encounters:  05/05/24 211 lb 6.7 oz (95.9 kg)  11/14/23 205 lb 3.2 oz (93.1 kg)  08/06/23 194 lb (88 kg)   Temp Readings from Last 3 Encounters:  05/14/24 (!) 97 F (36.1 C) (Oral)  05/06/24 97.7 F (36.5 C) (Oral)  11/14/23 (!) 97 F (36.1 C)   BP Readings from Last 3 Encounters:  05/14/24 132/71  05/06/24 105/61  11/14/23 (!) 171/104   Pulse Readings from Last 3 Encounters:  05/14/24 87  05/06/24 96  11/14/23 74   Pain Assessment Pain Score: Asleep/10  Unable to assess due to encounter type    ECOG = 2  0 - Asymptomatic (Fully active, able to carry on all predisease activities without restriction)  1 - Symptomatic but completely ambulatory (Restricted in physically strenuous activity but ambulatory and able to carry out work of a light or sedentary nature. For example, light housework, office work)  2 - Symptomatic, <50% in bed during the day (Ambulatory and capable of all self care but unable to carry out any work activities. Up and about more than 50% of waking hours)  3 - Symptomatic, >50% in bed, but not bedbound (Capable of only limited self-care, confined to bed or chair 50% or  more of waking hours)  4 - Bedbound (Completely disabled. Cannot carry on any self-care. Totally confined to bed or chair)  5 - Death   Raylene MM, Creech RH, Tormey DC, et al. [phone]). Toxicity and response criteria of the [facility]. Am. DOROTHA Bridges. Oncol. 5 (6): 649-55      IMPRESSION/PLAN: 1. Recurrent Metastatic  Stage IA2, cT1bN0M0, NSCLC, of the LUL with brain and leptomeningeal disease. Dr. Dewey has reviewed his course to date and prior treatment. Prior to his surgery his tumor type was clinically diagnosed but he did not have tissue confirmation prior to SBRT. I spent time with him today along with his wife discussing that radiotherapy is recommended to the brain and cauda equina, and that given he will be in [place] for several weeks doing his inpatient rehabilitation, it would be reasonable to proceed with treatment as we would likely finish this before his discharge. We discussed 2 weeks of palliative radiotherapy to the level of the cauda, and 3 fractions of stereotactic radiosurgery to the brain. We would need a 3T MRI scan with SRS protocol to proceed, and will discuss his case with our special procedures navigator. He will need to stay at [facility] for his CIR but will need daily transport back and forth with [facility]. We discussed the need to proceed with simulation as well. We discussed the risks, benefits, short, and long term effects of radiotherapy, as well as the palliative intent, and the patient is interested in proceeding. Dr. Melanee plans to see him as an outpatient and is going to order molecular studies on his tumor to determine next steps of systemic options of therapy. I will reach out to him in the morning to coordinate next steps.   In a visit lasting 60 minutes, greater than 50% of the time was spent by phone and in floor time discussing the patient's condition, in preparation for the discussion, and coordinating the patient's care.       Donald KYM Husband, [facility]   **Disclaimer: This note was dictated with voice recognition software. Similar sounding words can inadvertently be transcribed and this note may contain transcription errors which may not have been corrected upon publication of note.**

## 2024-05-14 NOTE — Plan of Care (Signed)
  Problem: Education: Goal: Knowledge of General Education information will improve Description: Including pain rating scale, medication(s)/side effects and non-pharmacologic comfort measures Outcome: Progressing   Problem: Clinical Measurements: Goal: Ability to maintain clinical measurements within normal limits will improve Outcome: Progressing Goal: Will remain free from infection Outcome: Progressing Goal: Diagnostic test results will improve Outcome: Progressing Goal: Respiratory complications will improve Outcome: Progressing Goal: Cardiovascular complication will be avoided Outcome: Progressing   Problem: Activity: Goal: Risk for activity intolerance will decrease Outcome: Progressing   Problem: Nutrition: Goal: Adequate nutrition will be maintained Outcome: Progressing   Problem: Coping: Goal: Level of anxiety will decrease Outcome: Progressing   Problem: Elimination: Goal: Will not experience complications related to urinary retention Outcome: Progressing   Problem: Pain Managment: Goal: General experience of comfort will improve and/or be controlled Outcome: Progressing   Problem: Safety: Goal: Ability to remain free from injury will improve Outcome: Progressing   Problem: Skin Integrity: Goal: Risk for impaired skin integrity will decrease Outcome: Progressing   Problem: Education: Goal: Knowledge of General Education information will improve Description: Including pain rating scale, medication(s)/side effects and non-pharmacologic comfort measures Outcome: Progressing

## 2024-05-14 NOTE — Progress Notes (Signed)
 [facility] at bedside to transport Pt to [facility] radiation and oncology department for simulation

## 2024-05-14 NOTE — TOC Progression Note (Signed)
 Transition of Care [facility]) - Progression Note    Patient Details  Name: Saige Busby MRN: [id number] Date of Birth: [birth year]  [facility] CM/SW Contact  Inocente GORMAN Kindle, LCSW Phone Number: 05/14/2024, 9:58 AM  Clinical Narrative:    CSW continuing to follow.    Expected Discharge Plan: IP Rehab Facility Barriers to Discharge: Continued Medical Work up, English as a second language teacher               Expected Discharge Plan and Services       Living arrangements for the past 2 months: Single Family Home                                       Social Drivers of Health (SDOH) Interventions SDOH Screenings   Food Insecurity: No Food Insecurity (05/07/2024)  Housing: High Risk (05/07/2024)  Transportation Needs: No Transportation Needs (05/07/2024)  Utilities: Not At Risk (05/07/2024)  Financial Resource Strain: Low Risk  (03/07/2024)   Received from [facility]  Recent Concern: Financial Resource Strain - High Risk (01/29/2024)   Received from [facility]  Physical Activity: Inactive (08/07/2023)   Received from [facility]  Social Connections: Unknown (05/07/2024)  Stress: Stress Concern Present (08/07/2023)   Received from [facility]  Tobacco Use: Medium Risk (05/08/2024)  Health Literacy: Adequate Health Literacy (08/07/2023)   Received from [facility]    Readmission Risk Interventions     No data to display

## 2024-05-14 NOTE — Progress Notes (Signed)
 Inpatient Rehab Admissions Coordinator:   I received approval for admit to CIR this morning.  Note pended consult from Rad/Onc this AM.  Per my discussion with MD and family yesterday plans were for outpatient, but on discussing with Dr. Trixie and Donald Husband, PA-C this AM it appears there are now plans for spine radiation with simulation today.  Discussed with cancer center radiation scheduling team and pt is scheduled for spinal radiation simulation today, and starting 10 treatments tomorrow.  Plan for brain simulation on 10/27 at 11:15 and 3 treatments starting 10/29.  I've requested for as many appointments as possible to occur after 2 pm to allow for therapy first.  Will plan for tentative admit to CIR tomorrow.  I've updated pt's spouse via phone.   Reche Lowers, PT, DPT Admissions Coordinator [phone] 05/14/24  12:30 PM

## 2024-05-14 NOTE — Progress Notes (Signed)
 PROGRESS NOTE  Roger Black FMW:[id number] DOB: 1956/02/03 DOA: 05/07/2024 PCP: [name], MD   LOS: 7 days   Brief Narrative / Interim history: Roger Black is a 68 year old male with a past medical history significant for HFrEF, lung cancer s/p radiation December 2024, COPD, CKD, and HTN presented to [facility] on 04/28/2024 with complaints of leg pain.  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  Patient then underwent right craniotomy on 10/16 by Dr. [name]. Postoperatively patient was admitted to ICU with PCCM consulted for medical management.   Sequence of events in the hospital   10/06 initially presented to [facility] for intractable  leg pain MRI lumbar spine revealed multilevel stenosis with enhancement of several nerve roots of the cauda equina without nodular masslike enhancements 10/7 neurosurgery evaluated with recommendations of LP, interventional radiology performed LP with approximately 20 mL clear colorless fluid obtained.  Opening pressure 22 closing pressure 9 10/8 meningitis/encephalitis panel negative Acyclovir and ampicillin discontinued 10/9 LP repeated per neurology recommendations, flow cytometry and cytology with cellblock preparation recommended 10/13 MRI brain completed and revealed circumscribed enhancing lesion in the right frontal lobe measuring 14 x 13 x 13 and cerebellar hemisphere 17 x 15 x 14 concerning for metastatic disease associated with vasogenic edema 10/15 transferred to [facility] for neurosurgery evaluation 10/16 underwent right craniotomy for resection of brain tumor per Dr. [name] 10/21-consulting radiation oncology for leptomeningeal carcinomatosis,  Subjective / 24h Interval events: Doing well this morning, denies any chest pain, denies any shortness of breath.  Assesement and Plan: Principal  problem Right frontal lobe mass status post right craniotomy for tumor resection, vasogenic edema-MRI of the brain initially showed circumscribed enhancing lesion in the right frontal lobe, 14 x 13 x 13 mm, cerebellar hemisphere 17 x 15 x 14 mm concerning for metastatic disease with associated vasogenic edema.  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  Continue Decadron 4 mg every 8 hourly.  He is starting radiation therapy today   History of COPD - Continue bronchodilators, respiratory status stable   History of CAD - Continue statins ARB, no chest pain   Essential hypertension - Continue irbesartan.  Blood pressure stable   Hyperlipidemia - Continue statin   HFrEF -Review of recent 2D echocardiogram from August 2025 with EF of 45%.  Continue statins, irbesartan.  Appears compensated at this time   CKD stage IIIa - Baseline Cr appears to be around 1.1-1.3.  Creatinine at baseline, 1.1   Goals of care - DNR.  Palliative care on board.   Debility deconditioning - Patient has been seen by physical therapy and recommended CIR. Plan to discharge tomorrow  Scheduled Meds:  atorvastatin  80 mg Oral Daily   baclofen  10 mg Oral BID   Chlorhexidine Gluconate Cloth  6 each Topical Q0600   dexamethasone  4 mg Oral Q8H   doxazosin  2 mg Oral QHS   DULoxetine  60 mg Oral Daily   enoxaparin (LOVENOX) injection  40 mg Subcutaneous Daily   fluticasone furoate-vilanterol  1 puff Inhalation Daily   irbesartan  300 mg Oral Daily   levETIRAcetam  500 mg Oral BID  montelukast  10 mg Oral Daily   pantoprazole  40 mg Oral Daily   polyethylene glycol  17 g Oral BID   pregabalin  150 mg Oral TID   senna-docusate  2  tablet Oral BID   Continuous Infusions: PRN Meds:.acetaminophen **OR** acetaminophen, butalbital-acetaminophen-caffeine, labetalol, ondansetron **OR** ondansetron (ZOFRAN) IV, mouth rinse, oxyCODONE, promethazine, white petrolatum, zolpidem  Current Outpatient Medications  Medication Instructions   acetaminophen (TYLENOL) 650 mg, Oral, Every 6 hours PRN   albuterol (VENTOLIN HFA) 108 (90 Base) MCG/ACT inhaler 2 puffs, Every 4 hours PRN   aspirin EC 81 mg, Daily   atorvastatin (LIPITOR) 80 mg, Daily   budesonide-formoterol (SYMBICORT) 160-4.5 MCG/ACT inhaler 2 puffs, 2 times daily   doxazosin (CARDURA) 2 mg, Daily at bedtime   DULoxetine (CYMBALTA) 60 mg, Oral, Daily   HYDROcodone-acetaminophen (NORCO/VICODIN) 5-325 MG tablet 1 tablet, 2 times daily PRN   ipratropium (ATROVENT) 0.03 % nasal spray Nasal   methocarbamol (ROBAXIN) 500 mg, Oral, 4 times daily   montelukast (SINGULAIR) 10 mg, Daily   morphine (PF) 2 mg, Intravenous, Every 4 hours PRN   olmesartan (BENICAR) 40 mg, Daily   ondansetron (ZOFRAN) 4 mg, Intravenous, Every 8 hours PRN   oxyCODONE-acetaminophen (PERCOCET/ROXICET) 5-325 MG tablet 1 tablet, Oral, Every 6 hours   pantoprazole (PROTONIX) 40 mg, Daily   pregabalin (LYRICA) 150 mg, Oral, 3 times daily   [Paused] spironolactone (ALDACTONE) 50 mg, Daily   [Paused] torsemide (DEMADEX) 100 mg, Daily PRN   zolpidem (AMBIEN) 5 mg, Oral, At bedtime PRN    Diet Orders (From admission, onward)     Start     Ordered   05/12/24 0944  Diet regular Fluid consistency: Thin  Diet effective now       Question:  Fluid consistency:  Answer:  Thin   05/12/24 0943            DVT prophylaxis: enoxaparin (LOVENOX) injection 40 mg Start: 05/13/24 1345 SCDs Start: 05/08/24 1110 SCDs Start: 05/07/24 0454   Lab Results  Component Value Date   PLT 186 05/14/2024      Code Status: Do not attempt resuscitation (DNR) PRE-ARREST INTERVENTIONS DESIRED  Family Communication: No  family at bedside  Status is: Inpatient Remains inpatient appropriate because: Severity of illness   Level of care: Med-Surg  Consultants:  Neurosurgery Radiation oncology  Objective: Vitals:   05/14/24 0500 05/14/24 0600 05/14/24 0700 05/14/24 0900  BP: 121/85 132/71 124/65 (!) 134/45  Pulse: 79 87 89   Resp: 12 13 16 12   Temp:   98.7 F (37.1 C)   TempSrc:   Bladder   SpO2: 92% (!) 80% 91%   Height:        Intake/Output Summary (Last 24 hours) at 05/14/2024 1043 Last data filed at 05/14/2024 1000 Gross per 24 hour  Intake 240 ml  Output 1400 ml  Net -1160 ml   Wt Readings from Last 3 Encounters:  05/05/24 95.9 kg  11/14/23 93.1 kg  08/06/23 88 kg    Examination:  Constitutional: NAD Eyes: no scleral icterus ENMT: Mucous membranes are moist.  Neck: normal, supple Respiratory: clear to auscultation bilaterally, no wheezing, no crackles.  Cardiovascular: Regular rate and rhythm, no murmurs / rubs / gallops.  Abdomen: non distended, no tenderness. Bowel sounds positive.  Musculoskeletal: no clubbing / cyanosis.   Data Reviewed: I have independently reviewed following labs and imaging studies   CBC Recent Labs  Lab 05/10/24 0558 05/11/24 0621 05/12/24 0531 05/13/24 0244 05/14/24  0247  WBC 8.4 6.2 5.2 6.7 6.9  HGB 14.3 13.7 14.1 14.0 14.1  HCT 43.5 40.8 42.2 42.1 42.3  PLT 191 188 175 187 186  MCV 98.6 97.4 97.9 97.5 97.9  MCH 32.4 32.7 32.7 32.4 32.6  MCHC 32.9 33.6 33.4 33.3 33.3  RDW 12.7 12.7 12.4 12.3 12.2    Recent Labs  Lab 05/08/24 0133 05/08/24 0856 05/10/24 0558 05/11/24 0621 05/12/24 0531 05/13/24 0244 05/14/24 0247  NA 132*   < > 137 134* 132* 133* 131*  K 5.1   < > 4.5 4.7 4.7 5.0 5.0  CL 97*   < > 102 100 102 102 101  CO2 23   < > 24 22 18* 22 21*  GLUCOSE 123*   < > 151* 139* 135* 147* 117*  BUN 25*   < > 30* 46* 47* 62* 61*  CREATININE 1.19   < > 1.18 1.32* 1.19 1.47* 1.38*  CALCIUM 9.4   < > 9.8 9.6 9.5 9.2 9.2  MG 2.0   --  2.3 2.5* 2.3 2.4  --   INR 1.1  --   --   --   --   --   --    < > = values in this interval not displayed.    ------------------------------------------------------------------------------------------------------------------ No results for input(s): CHOL, HDL, LDLCALC, TRIG, CHOLHDL, LDLDIRECT in the last 72 hours.  No results found for: HGBA1C ------------------------------------------------------------------------------------------------------------------ No results for input(s): TSH, T4TOTAL, T3FREE, THYROIDAB in the last 72 hours.  Invalid input(s): FREET3  Cardiac Enzymes No results for input(s): CKMB, TROPONINI, MYOGLOBIN in the last 168 hours.  Invalid input(s): CK ------------------------------------------------------------------------------------------------------------------    Component Value Date/Time   BNP 7.4 04/28/2024 1303    CBG: No results for input(s): GLUCAP in the last 168 hours.  Recent Results (from the past 240 hours)  Surgical pcr screen     Status: None   Collection Time: 05/07/24  7:34 PM   Specimen: Nasal Mucosa; Nasal Swab  Result Value Ref Range Status   MRSA, PCR NEGATIVE NEGATIVE Final   Staphylococcus aureus NEGATIVE NEGATIVE Final    Comment: (NOTE) The Xpert SA Assay (FDA approved for NASAL specimens in patients 75 years of age and older), is one component of a comprehensive surveillance program. It is not intended to diagnose infection nor to guide or monitor treatment. Performed at [facility], [address], [place], [name] [address]   MRSA Next Gen by PCR, Nasal     Status: None   Collection Time: 05/08/24 11:38 AM   Specimen: Nasal Mucosa; Nasal Swab  Result Value Ref Range Status   MRSA by PCR Next Gen NOT DETECTED NOT DETECTED Final    Comment: (NOTE) The GeneXpert MRSA Assay (FDA approved for NASAL specimens only), is one component of a comprehensive MRSA colonization  surveillance program. It is not intended to diagnose MRSA infection nor to guide or monitor treatment for MRSA infections. Test performance is not FDA approved in patients less than 41 years old. Performed at [facility], [address] [place], [name] [address]      Radiology Studies: No results found.   Nilda Fendt, MD, PhD [facility]  Between 7 am - 7 pm I am available, please contact me via Amion (for emergencies) or Securechat (non urgent messages)  Between 7 pm - 7 am I am not available, please contact night coverage MD/APP via Amion

## 2024-05-14 NOTE — Progress Notes (Signed)
 OT Cancellation Note  Patient Details Name: Roger Black MRN: [id number] DOB: 25-Feb-1956   Cancelled Treatment:    Reason Eval/Treat Not Completed: Patient at procedure or test/ unavailable- per RN pt at [facility] at this time. Will follow and see as able.   Roger Black, OT Acute Rehabilitation Services Office [phone] Secure Chat Preferred    Roger Black Hope 05/14/2024, 11:33 AM

## 2024-05-14 NOTE — H&P (Shared)
 Physical Medicine and Rehabilitation Admission H&P    CC:   HPI: Roger Black is a 68 year old male with history of HTN, CHF, COPD, CKD,  OSA,gout, Left upper lobe bronchogenic cancer s/p XRT, left shoulder pain (going to outpatient Tx) and onset of LLE pain with work MRI lumbar spine revealing multilevel stenosis with enhancement of nerve roots progressing to inability to walk. He was seen in ED on 10/06 and further imaging done showing abnormal linear enhancement along surface of thoracic spine rasing concern for leptomeningeal metastatic disease or inflammatory v/s infectious process as well as increased moderate stenosis and severe R>L neural foraminal stenosis C7-T1, unchanged moderate to severe bilateral neural foraminal stenosis C5/C6  NM bone scan negative for metastatic disease.    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   vertex scalp, treating with 5FU/calcipotriene    Past Surgical History:  Procedure Laterality Date   APPLICATION OF CRANIAL NAVIGATION Right 05/08/2024   Procedure: COMPUTER-ASSISTED NAVIGATION, FOR CRANIAL PROCEDURE;  Surgeon: [name] Dino HERO, MD;  Location: [facility] OR;  Service: Neurosurgery;  Laterality: Right;   COLON SURGERY     COLONOSCOPY WITH PROPOFOL  N/A 10/20/2020   Procedure: COLONOSCOPY WITH PROPOFOL ;  Surgeon: Toledo, Ladell POUR, MD;  Location: [facility] ENDOSCOPY;  Service: Gastroenterology;  Laterality:  N/A;   CRANIOTOMY Right 05/08/2024   Procedure: CRANIOTOMY TUMOR EXCISION;  Surgeon: [name] Dino HERO, MD;  Location: [facility] OR;  Service: Neurosurgery;  Laterality: Right;    Family History  Problem Relation Age of Onset   Hypertension Mother    Hypertension Father     Social History:  reports that he has quit smoking. His smoking use included cigarettes. He has a 47 pack-year smoking history. He has never used smokeless tobacco. He reports that he does not drink alcohol  and does not use drugs.   Allergies: No Known Allergies   Medications Prior to Admission  Medication Sig Dispense Refill   acetaminophen (TYLENOL) 325 MG tablet Take 2 tablets (650 mg total) by mouth every 6 (six) hours as needed for mild pain (pain score 1-3) or fever.     albuterol (VENTOLIN HFA) 108 (90 Base) MCG/ACT inhaler Inhale 2 puffs into the lungs every 4 (four) hours as needed for shortness of breath.     aspirin 81 MG EC tablet Take 81 mg by mouth daily.     atorvastatin (LIPITOR) 80 MG tablet Take 80 mg by mouth daily.     budesonide-formoterol (SYMBICORT) 160-4.5 MCG/ACT inhaler Inhale 2 puffs into the lungs 2 (two) times daily.     doxazosin (CARDURA) 2 MG tablet Take 2 mg by mouth at bedtime.     DULoxetine (CYMBALTA) 60 MG capsule Take 1 capsule (60 mg total) by mouth daily.     HYDROcodone-acetaminophen (NORCO/VICODIN) 5-325 MG tablet Take 1 tablet by mouth 2 (two) times daily as needed  for moderate pain (pain score 4-6).     methocarbamol (ROBAXIN) 500 MG tablet Take 1 tablet (500 mg total) by mouth 4 (four) times daily.     montelukast (SINGULAIR) 10 MG tablet Take 10 mg by mouth daily.     olmesartan (BENICAR) 40 MG tablet Take 40 mg by mouth daily.     ondansetron (ZOFRAN) 4 MG/2ML SOLN injection Inject 2 mLs (4 mg total) into the vein every 8 (eight) hours as needed for nausea or vomiting.     oxyCODONE-acetaminophen (PERCOCET/ROXICET) 5-325 MG tablet Take 1 tablet by mouth every 6 (six) hours.      pantoprazole (PROTONIX) 40 MG tablet Take 40 mg by mouth daily.     pregabalin (LYRICA) 150 MG capsule Take 1 capsule (150 mg total) by mouth 3 (three) times daily.     [Paused] spironolactone (ALDACTONE) 50 MG tablet Take 50 mg by mouth daily.     [Paused] torsemide (DEMADEX) 100 MG tablet Take 100 mg by mouth daily as needed (edema).     zolpidem (AMBIEN) 5 MG tablet Take 1 tablet (5 mg total) by mouth at bedtime as needed for sleep.     ipratropium (ATROVENT) 0.03 % nasal spray Place into the nose.     Morphine Sulfate (MORPHINE, PF,) 2 MG/ML injection Inject 1 mL (2 mg total) into the vein every 4 (four) hours as needed (breakthrough pain). (Patient not taking: Reported on 05/07/2024)      Home: Home Living Family/patient expects to be discharged to:: Private residence Living Arrangements: Spouse/significant other Available Help at Discharge: Family, Available 24 hours/day Type of Home: House Home Access: Level entry Home Layout: One level Bathroom Shower/Tub: Engineer, manufacturing systems: Standard Home Equipment: Cane - single point   Functional History: Prior Function Prior Level of Function : Independent/Modified Independent, Working/employed Mobility Comments: indep with no AD ADLs Comments: MOD I-I with ADL/IADL, works at [employer] Status:  Mobility: Bed Mobility Overal bed mobility: Needs Assistance Bed Mobility: Rolling, Sidelying to Sit, Sit to Sidelying Rolling: Supervision Sidelying to sit: Min assist, +2 for safety/equipment Supine to sit: Min assist, +2 for safety/equipment Sit to supine: HOB elevated, Min assist General bed mobility comments: increased time and cueing for technique, minA for trunk elevation Transfers Overall transfer level: Needs assistance Equipment used: Rolling walker (2 wheels) Transfers: Sit to/from Stand Sit to Stand: Min assist, +2 physical assistance Step pivot transfers: Mod assist, +2  safety/equipment General transfer comment: minA to power up, pt with wide base of support, required significant time and 2 trials prior to standing, modA for step pvt from recliner to [facility] via HHA, max directional verbal cues Ambulation/Gait Ambulation/Gait assistance: Min assist, +2 safety/equipment Gait Distance (Feet): 30 Feet Assistive device: Rolling walker (2 wheels) Gait Pattern/deviations: Decreased stride length, Wide base of support, Staggering right, Staggering left General Gait Details: pt with noted lateral sway L/R and ant/posterior, pt became more confused/delayed response however VSS, pt stating Im just trying to figure out what I'm doing, minA for walker management and 2nd person for chair follow and line management Gait velocity: decreased Gait velocity interpretation: <1.31 ft/sec, indicative of household ambulator    ADL: ADL Overall ADL's : Needs assistance/impaired Grooming: Set up, Sitting Upper Body Dressing : Contact guard assist, Sitting Lower Body Dressing: Moderate assistance, Sit to/from stand Toilet Transfer: Minimal assistance, +2 for physical assistance Toilet Transfer Details (indicate cue type and reason): bil hand held support at EOB Functional mobility during ADLs:  Minimal assistance, +2 for physical assistance  Cognition: Cognition Orientation Level: Oriented to person, Oriented to place, Oriented to situation, Disoriented to time Cognition Arousal: Alert Behavior During Therapy: Lability   Blood pressure 132/71, pulse 87, temperature 98.7 F (37.1 C), temperature source Bladder, resp. rate 13, height 5' 9 (1.753 m), SpO2 (!) 80%. Physical Exam  Results for orders placed or performed during the hospital encounter of 05/07/24 (from the past 48 hours)  CBC     Status: None   Collection Time: 05/13/24  2:44 AM  Result Value Ref Range   WBC 6.7 4.0 - 10.5 K/uL   RBC 4.32 4.22 - 5.81 MIL/uL   Hemoglobin 14.0 13.0 - 17.0 g/dL   HCT 57.8 60.9 -  47.9 %   MCV 97.5 80.0 - 100.0 fL   MCH 32.4 26.0 - 34.0 pg   MCHC 33.3 30.0 - 36.0 g/dL   RDW 87.6 88.4 - 84.4 %   Platelets 187 150 - 400 K/uL   nRBC 0.0 0.0 - 0.2 %    Comment: Performed at [facility], [address] [place], [name] [address]  Basic metabolic panel     Status: Abnormal   Collection Time: 05/13/24  2:44 AM  Result Value Ref Range   Sodium 133 (L) 135 - 145 mmol/L   Potassium 5.0 3.5 - 5.1 mmol/L   Chloride 102 98 - 111 mmol/L   CO2 22 22 - 32 mmol/L   Glucose, Bld 147 (H) 70 - 99 mg/dL    Comment: Glucose reference range applies only to samples taken after fasting for at least 8 hours.   BUN 62 (H) 8 - 23 mg/dL   Creatinine, Ser 8.52 (H) 0.61 - 1.24 mg/dL   Calcium 9.2 8.9 - 89.6 mg/dL   GFR, Estimated 52 (L) >60 mL/min    Comment: (NOTE) Calculated using the CKD-EPI Creatinine Equation (2021)    Anion gap 9 5 - 15    Comment: Performed at [facility], [address] [place], [name] [address]  Magnesium     Status: None   Collection Time: 05/13/24  2:44 AM  Result Value Ref Range   Magnesium 2.4 1.7 - 2.4 mg/dL    Comment: Performed at [facility], [address], [place], [name] [address]  Basic metabolic panel     Status: Abnormal   Collection Time: 05/14/24  2:47 AM  Result Value Ref Range   Sodium 131 (L) 135 - 145 mmol/L   Potassium 5.0 3.5 - 5.1 mmol/L   Chloride 101 98 - 111 mmol/L   CO2 21 (L) 22 - 32 mmol/L   Glucose, Bld 117 (H) 70 - 99 mg/dL    Comment: Glucose reference range applies only to samples taken after fasting for at least 8 hours.   BUN 61 (H) 8 - 23 mg/dL   Creatinine, Ser 8.61 (H) 0.61 - 1.24 mg/dL   Calcium 9.2 8.9 - 89.6 mg/dL   GFR, Estimated 56 (L) >60 mL/min    Comment: (NOTE) Calculated using the CKD-EPI Creatinine Equation (2021)    Anion gap 9 5 - 15    Comment: Performed at [facility], [address], [place], [name] [address]  CBC     Status: None   Collection Time: 05/14/24   2:47 AM  Result Value Ref Range   WBC 6.9 4.0 - 10.5 K/uL   RBC 4.32 4.22 - 5.81 MIL/uL   Hemoglobin 14.1  13.0 - 17.0 g/dL   HCT 57.6 60.9 - 47.9 %   MCV 97.9 80.0 - 100.0 fL   MCH 32.6 26.0 - 34.0 pg   MCHC 33.3 30.0 - 36.0 g/dL   RDW 87.7 88.4 - 84.4 %   Platelets 186 150 - 400 K/uL   nRBC 0.0 0.0 - 0.2 %    Comment: Performed at [facility], [address] [place], [name] [address]   No results found.    Blood pressure 132/71, pulse 87, temperature 98.7 F (37.1 C), temperature source Bladder, resp. rate 13, height 5' 9 (1.753 m), SpO2 (!) 80%.  Medical Problem List and Plan: 1. Functional deficits secondary to ***  -patient may *** shower  -ELOS/Goals: *** 2.  Antithrombotics: -DVT/anticoagulation:  {VTE PROPHYLAXIS/ANTICOAGULATION - [id number]:[id number]}  -antiplatelet therapy: *** 3. Pain Management: *** 4. Mood/Behavior/Sleep: ***  -antipsychotic agents: *** 5. Neuropsych/cognition: This patient *** capable of making decisions on *** own behalf. 6. Skin/Wound Care: *** 7. Fluids/Electrolytes/Nutrition: ***     ***  Sharlet GORMAN Schmitz, PA-C 05/14/2024

## 2024-05-14 NOTE — Progress Notes (Signed)
 I spoke with the patient this morning about simulation in our department and coordinated this with his floor team and our department. He came around 11 am for this and I was able to call his spouse [name] while the patient was in the department. Both the patient and his spouse are in agreement to proceed with palliative radiation for the leptomeningeal disease at the cauda equina which we will plan today, and begin the first of 10 fractions (M-F). His radiation to this area will finish on 05/28/24. Dr. Dewey is in agreement to start the first of 3 fractions (every other business day) beginning on 05/21/24 and which will complete on 05/26/24. Written consent is obtained and placed in the chart, a copy was provided to the patient.      Donald KYM Husband, PAC

## 2024-05-14 NOTE — Plan of Care (Signed)
   Medical records reviewed including progress notes, labs and imaging. Attempted to visit patient at bedside, however he was at [facility] for CT simulation in anticipation of inpatient radiation throughout his time at [facility].  Goals of care are clear at this time for inpatient rehab then discharge home to follow with outpatient oncology and palliative care clinics at [facility] in [place]. Referral to palliative care has been placed. PMT will continue to follow peripherally for needs.  Thank you for your referral and allowing PMT to assist in [name]'s care.   Roger Deakins, PA-C Palliative Medicine Team  Team Phone # [phone]   NO CHARGE

## 2024-05-15 ENCOUNTER — Inpatient Hospital Stay (HOSPITAL_COMMUNITY)
Admission: AD | Admit: 2024-05-15 | Discharge: 2024-06-03 | DRG: 945 | Disposition: A | Discharge status: Home Health | Source: Intra-hospital | Attending: Physical Medicine and Rehabilitation | Admitting: Physical Medicine and Rehabilitation

## 2024-05-15 ENCOUNTER — Ambulatory Visit: Admitting: Radiation Oncology

## 2024-05-15 ENCOUNTER — Other Ambulatory Visit: Payer: Self-pay

## 2024-05-15 DIAGNOSIS — R4189 Other symptoms and signs involving cognitive functions and awareness: Secondary | ICD-10-CM | POA: Diagnosis not present

## 2024-05-15 DIAGNOSIS — Z86008 Personal history of in-situ neoplasm of other site: Secondary | ICD-10-CM

## 2024-05-15 DIAGNOSIS — Z7951 Long term (current) use of inhaled steroids: Secondary | ICD-10-CM

## 2024-05-15 DIAGNOSIS — J4489 Other specified chronic obstructive pulmonary disease: Secondary | ICD-10-CM | POA: Diagnosis not present

## 2024-05-15 DIAGNOSIS — G253 Myoclonus: Secondary | ICD-10-CM | POA: Diagnosis not present

## 2024-05-15 DIAGNOSIS — R5381 Other malaise: Principal | ICD-10-CM | POA: Diagnosis present

## 2024-05-15 DIAGNOSIS — G9389 Other specified disorders of brain: Secondary | ICD-10-CM

## 2024-05-15 DIAGNOSIS — Z66 Do not resuscitate: Secondary | ICD-10-CM | POA: Diagnosis not present

## 2024-05-15 DIAGNOSIS — Z87891 Personal history of nicotine dependence: Secondary | ICD-10-CM

## 2024-05-15 DIAGNOSIS — I82412 Acute embolism and thrombosis of left femoral vein: Secondary | ICD-10-CM | POA: Diagnosis not present

## 2024-05-15 DIAGNOSIS — N1831 Chronic kidney disease, stage 3a: Secondary | ICD-10-CM | POA: Diagnosis present

## 2024-05-15 DIAGNOSIS — E871 Hypo-osmolality and hyponatremia: Secondary | ICD-10-CM | POA: Diagnosis not present

## 2024-05-15 DIAGNOSIS — M792 Neuralgia and neuritis, unspecified: Secondary | ICD-10-CM | POA: Diagnosis not present

## 2024-05-15 DIAGNOSIS — Z79891 Long term (current) use of opiate analgesic: Secondary | ICD-10-CM

## 2024-05-15 DIAGNOSIS — R7303 Prediabetes: Secondary | ICD-10-CM | POA: Diagnosis present

## 2024-05-15 DIAGNOSIS — M109 Gout, unspecified: Secondary | ICD-10-CM | POA: Diagnosis not present

## 2024-05-15 DIAGNOSIS — I951 Orthostatic hypotension: Secondary | ICD-10-CM | POA: Diagnosis not present

## 2024-05-15 DIAGNOSIS — Z923 Personal history of irradiation: Secondary | ICD-10-CM

## 2024-05-15 DIAGNOSIS — N390 Urinary tract infection, site not specified: Secondary | ICD-10-CM | POA: Diagnosis not present

## 2024-05-15 DIAGNOSIS — E785 Hyperlipidemia, unspecified: Secondary | ICD-10-CM | POA: Diagnosis not present

## 2024-05-15 DIAGNOSIS — K5900 Constipation, unspecified: Secondary | ICD-10-CM | POA: Diagnosis not present

## 2024-05-15 DIAGNOSIS — G4733 Obstructive sleep apnea (adult) (pediatric): Secondary | ICD-10-CM | POA: Diagnosis not present

## 2024-05-15 DIAGNOSIS — C3412 Malignant neoplasm of upper lobe, left bronchus or lung: Secondary | ICD-10-CM | POA: Diagnosis not present

## 2024-05-15 DIAGNOSIS — I5022 Chronic systolic (congestive) heart failure: Secondary | ICD-10-CM | POA: Diagnosis not present

## 2024-05-15 DIAGNOSIS — N319 Neuromuscular dysfunction of bladder, unspecified: Secondary | ICD-10-CM | POA: Diagnosis present

## 2024-05-15 DIAGNOSIS — R339 Retention of urine, unspecified: Secondary | ICD-10-CM | POA: Diagnosis present

## 2024-05-15 DIAGNOSIS — I130 Hypertensive heart and chronic kidney disease with heart failure and stage 1 through stage 4 chronic kidney disease, or unspecified chronic kidney disease: Secondary | ICD-10-CM | POA: Diagnosis present

## 2024-05-15 DIAGNOSIS — D696 Thrombocytopenia, unspecified: Secondary | ICD-10-CM | POA: Diagnosis present

## 2024-05-15 DIAGNOSIS — Z85118 Personal history of other malignant neoplasm of bronchus and lung: Secondary | ICD-10-CM

## 2024-05-15 DIAGNOSIS — E875 Hyperkalemia: Secondary | ICD-10-CM | POA: Diagnosis present

## 2024-05-15 DIAGNOSIS — Z7982 Long term (current) use of aspirin: Secondary | ICD-10-CM

## 2024-05-15 DIAGNOSIS — G9341 Metabolic encephalopathy: Secondary | ICD-10-CM | POA: Diagnosis not present

## 2024-05-15 DIAGNOSIS — M7989 Other specified soft tissue disorders: Secondary | ICD-10-CM | POA: Diagnosis not present

## 2024-05-15 DIAGNOSIS — C7931 Secondary malignant neoplasm of brain: Principal | ICD-10-CM | POA: Diagnosis present

## 2024-05-15 DIAGNOSIS — M25512 Pain in left shoulder: Secondary | ICD-10-CM | POA: Diagnosis not present

## 2024-05-15 DIAGNOSIS — D72829 Elevated white blood cell count, unspecified: Secondary | ICD-10-CM | POA: Diagnosis not present

## 2024-05-15 DIAGNOSIS — Z510 Encounter for antineoplastic radiation therapy: Secondary | ICD-10-CM | POA: Diagnosis not present

## 2024-05-15 DIAGNOSIS — C801 Malignant (primary) neoplasm, unspecified: Secondary | ICD-10-CM | POA: Diagnosis not present

## 2024-05-15 DIAGNOSIS — R262 Difficulty in walking, not elsewhere classified: Secondary | ICD-10-CM | POA: Diagnosis present

## 2024-05-15 DIAGNOSIS — J690 Pneumonitis due to inhalation of food and vomit: Secondary | ICD-10-CM | POA: Diagnosis not present

## 2024-05-15 DIAGNOSIS — I2510 Atherosclerotic heart disease of native coronary artery without angina pectoris: Secondary | ICD-10-CM | POA: Diagnosis present

## 2024-05-15 DIAGNOSIS — Z8249 Family history of ischemic heart disease and other diseases of the circulatory system: Secondary | ICD-10-CM

## 2024-05-15 DIAGNOSIS — G4700 Insomnia, unspecified: Secondary | ICD-10-CM | POA: Diagnosis present

## 2024-05-15 DIAGNOSIS — C7951 Secondary malignant neoplasm of bone: Secondary | ICD-10-CM | POA: Diagnosis not present

## 2024-05-15 DIAGNOSIS — J439 Emphysema, unspecified: Secondary | ICD-10-CM | POA: Diagnosis present

## 2024-05-15 DIAGNOSIS — N179 Acute kidney failure, unspecified: Secondary | ICD-10-CM | POA: Diagnosis present

## 2024-05-15 DIAGNOSIS — T380X5A Adverse effect of glucocorticoids and synthetic analogues, initial encounter: Secondary | ICD-10-CM | POA: Diagnosis not present

## 2024-05-15 DIAGNOSIS — N3000 Acute cystitis without hematuria: Secondary | ICD-10-CM | POA: Diagnosis not present

## 2024-05-15 DIAGNOSIS — Z79899 Other long term (current) drug therapy: Secondary | ICD-10-CM

## 2024-05-15 LAB — RAD ONC ARIA SESSION SUMMARY
Course Elapsed Days: 0
Plan Fractions Treated to Date: 1
Plan Prescribed Dose Per Fraction: 3 Gy
Plan Total Fractions Prescribed: 10
Plan Total Prescribed Dose: 30 Gy
Reference Point Dosage Given to Date: 3 Gy
Reference Point Session Dosage Given: 3 Gy
Session Number: 1

## 2024-05-15 MED ORDER — PROCHLORPERAZINE MALEATE 5 MG PO TABS: 5.0000 mg | ORAL_TABLET | Freq: Four times a day (QID) | ORAL | Status: DC | PRN

## 2024-05-15 MED ORDER — FLEET ENEMA RE ENEM: 1.0000 | ENEMA | Freq: Once | RECTAL | Status: DC | PRN

## 2024-05-15 MED ORDER — DEXAMETHASONE 4 MG PO TABS
4.0000 mg | ORAL_TABLET | Freq: Three times a day (TID) | ORAL | Status: DC
  Administered 2024-05-15 – 2024-05-29 (×41): 4 mg via ORAL
  Filled 2024-05-15 (×41): qty 1

## 2024-05-15 MED ORDER — ACETAMINOPHEN 325 MG PO TABS
325.0000 mg | ORAL_TABLET | ORAL | Status: DC | PRN
  Administered 2024-05-17: 650 mg via ORAL
  Filled 2024-05-15: qty 2

## 2024-05-15 MED ORDER — PROCHLORPERAZINE EDISYLATE 10 MG/2ML IJ SOLN: 5.0000 mg | Freq: Four times a day (QID) | INTRAMUSCULAR | Status: DC | PRN

## 2024-05-15 MED ORDER — FLUTICASONE FUROATE-VILANTEROL 200-25 MCG/ACT IN AEPB
1.0000 | INHALATION_SPRAY | Freq: Every day | RESPIRATORY_TRACT | Status: DC
  Administered 2024-05-16 – 2024-06-03 (×19): 1 via RESPIRATORY_TRACT
  Filled 2024-05-15: qty 28

## 2024-05-15 MED ORDER — PREGABALIN 75 MG PO CAPS
150.0000 mg | ORAL_CAPSULE | Freq: Three times a day (TID) | ORAL | Status: DC
  Administered 2024-05-15 – 2024-06-03 (×56): 150 mg via ORAL
  Filled 2024-05-15 (×12): qty 2
  Filled 2024-05-15: qty 6
  Filled 2024-05-15 (×21): qty 2
  Filled 2024-05-15: qty 6
  Filled 2024-05-15 (×22): qty 2

## 2024-05-15 MED ORDER — BACLOFEN 10 MG PO TABS: 10.0000 mg | ORAL_TABLET | Freq: Two times a day (BID) | ORAL | Status: DC

## 2024-05-15 MED ORDER — BISACODYL 10 MG RE SUPP: 10.0000 mg | Freq: Every day | RECTAL | Status: DC | PRN

## 2024-05-15 MED ORDER — ENOXAPARIN SODIUM 40 MG/0.4ML IJ SOSY
40.0000 mg | PREFILLED_SYRINGE | INTRAMUSCULAR | Status: DC
  Administered 2024-05-16 – 2024-05-31 (×16): 40 mg via SUBCUTANEOUS
  Filled 2024-05-15 (×17): qty 0.4

## 2024-05-15 MED ORDER — PROCHLORPERAZINE 25 MG RE SUPP: 12.5000 mg | Freq: Four times a day (QID) | RECTAL | Status: DC | PRN

## 2024-05-15 MED ORDER — ATORVASTATIN CALCIUM 80 MG PO TABS
80.0000 mg | ORAL_TABLET | Freq: Every day | ORAL | Status: DC
  Administered 2024-05-16 – 2024-06-03 (×19): 80 mg via ORAL
  Filled 2024-05-15 (×19): qty 1

## 2024-05-15 MED ORDER — BACLOFEN 10 MG PO TABS
10.0000 mg | ORAL_TABLET | ORAL | Status: AC
  Administered 2024-05-15: 10 mg via ORAL
  Filled 2024-05-15: qty 1

## 2024-05-15 MED ORDER — POLYETHYLENE GLYCOL 3350 17 G PO PACK
17.0000 g | PACK | Freq: Two times a day (BID) | ORAL | Status: DC
  Administered 2024-05-15 – 2024-05-20 (×9): 17 g via ORAL
  Filled 2024-05-15 (×13): qty 1

## 2024-05-15 MED ORDER — OXYCODONE-ACETAMINOPHEN 5-325 MG PO TABS
1.0000 | ORAL_TABLET | ORAL | Status: DC | PRN
Start: 2024-05-15 — End: 2024-05-15
  Filled 2024-05-15: qty 1

## 2024-05-15 MED ORDER — ZOLPIDEM TARTRATE 5 MG PO TABS
5.0000 mg | ORAL_TABLET | Freq: Every evening | ORAL | Status: DC | PRN
  Administered 2024-05-15 – 2024-05-18 (×3): 5 mg via ORAL
  Filled 2024-05-15 (×3): qty 1

## 2024-05-15 MED ORDER — SENNOSIDES-DOCUSATE SODIUM 8.6-50 MG PO TABS
2.0000 | ORAL_TABLET | Freq: Two times a day (BID) | ORAL | Status: DC
  Administered 2024-05-15 – 2024-05-20 (×10): 2 via ORAL
  Filled 2024-05-15 (×13): qty 2

## 2024-05-15 MED ORDER — BACLOFEN 10 MG PO TABS
10.0000 mg | ORAL_TABLET | Freq: Three times a day (TID) | ORAL | Status: DC
  Administered 2024-05-15 – 2024-05-17 (×5): 10 mg via ORAL
  Filled 2024-05-15 (×5): qty 1

## 2024-05-15 MED ORDER — DOXAZOSIN MESYLATE 2 MG PO TABS
2.0000 mg | ORAL_TABLET | Freq: Every day | ORAL | Status: DC
Start: 2024-05-15 — End: 2024-05-17
  Administered 2024-05-15 – 2024-05-16 (×2): 2 mg via ORAL
  Filled 2024-05-15 (×3): qty 1

## 2024-05-15 MED ORDER — OXYCODONE-ACETAMINOPHEN 5-325 MG PO TABS: 1.0000 | ORAL_TABLET | Freq: Four times a day (QID) | ORAL | Status: DC

## 2024-05-15 MED ORDER — PANTOPRAZOLE SODIUM 40 MG PO TBEC: 40.0000 mg | DELAYED_RELEASE_TABLET | Freq: Every day | ORAL | Status: DC

## 2024-05-15 MED ORDER — DIPHENHYDRAMINE HCL 25 MG PO CAPS
25.0000 mg | ORAL_CAPSULE | Freq: Four times a day (QID) | ORAL | Status: DC | PRN
  Administered 2024-05-16: 25 mg via ORAL
  Filled 2024-05-15: qty 1

## 2024-05-15 MED ORDER — OXYCODONE-ACETAMINOPHEN 5-325 MG PO TABS
1.0000 | ORAL_TABLET | ORAL | Status: DC | PRN
  Administered 2024-05-15 – 2024-05-17 (×6): 1 via ORAL
  Filled 2024-05-15 (×6): qty 1

## 2024-05-15 MED ORDER — METHOCARBAMOL 500 MG PO TABS: 500.0000 mg | ORAL_TABLET | Freq: Four times a day (QID) | ORAL | Status: DC

## 2024-05-15 MED ORDER — IRBESARTAN 300 MG PO TABS
300.0000 mg | ORAL_TABLET | Freq: Every day | ORAL | Status: DC
  Administered 2024-05-16 – 2024-05-18 (×3): 300 mg via ORAL
  Filled 2024-05-15 (×5): qty 1

## 2024-05-15 MED ORDER — DULOXETINE HCL 60 MG PO CPEP
60.0000 mg | ORAL_CAPSULE | Freq: Every day | ORAL | Status: DC
  Administered 2024-05-16 – 2024-06-03 (×19): 60 mg via ORAL
  Filled 2024-05-15 (×19): qty 1

## 2024-05-15 MED ORDER — GUAIFENESIN-DM 100-10 MG/5ML PO SYRP
5.0000 mL | ORAL_SOLUTION | Freq: Four times a day (QID) | ORAL | Status: DC | PRN
  Administered 2024-05-23: 10 mL via ORAL
  Filled 2024-05-15: qty 10

## 2024-05-15 MED ORDER — ALBUTEROL SULFATE (2.5 MG/3ML) 0.083% IN NEBU: 2.5000 mg | INHALATION_SOLUTION | RESPIRATORY_TRACT | Status: DC | PRN

## 2024-05-15 MED ORDER — ALUM & MAG HYDROXIDE-SIMETH 200-200-20 MG/5ML PO SUSP: 30.0000 mL | ORAL | Status: DC | PRN

## 2024-05-15 MED ORDER — DEXAMETHASONE 4 MG PO TABS: 4.0000 mg | ORAL_TABLET | Freq: Three times a day (TID) | ORAL | Status: DC

## 2024-05-15 MED ORDER — MONTELUKAST SODIUM 10 MG PO TABS
10.0000 mg | ORAL_TABLET | Freq: Every day | ORAL | Status: DC
Start: 2024-05-16 — End: 2024-06-03
  Administered 2024-05-16 – 2024-06-03 (×19): 10 mg via ORAL
  Filled 2024-05-15 (×19): qty 1

## 2024-05-15 MED ORDER — PANTOPRAZOLE SODIUM 40 MG PO TBEC
40.0000 mg | DELAYED_RELEASE_TABLET | Freq: Two times a day (BID) | ORAL | Status: DC
  Administered 2024-05-15 – 2024-06-03 (×38): 40 mg via ORAL
  Filled 2024-05-15 (×40): qty 1

## 2024-05-15 MED ORDER — IPRATROPIUM BROMIDE 0.06 % NA SOLN
1.0000 | Freq: Two times a day (BID) | NASAL | Status: DC
  Administered 2024-05-15 – 2024-06-03 (×36): 1 via NASAL
  Filled 2024-05-15: qty 15
  Filled 2024-05-15 (×2): qty 30

## 2024-05-15 NOTE — Progress Notes (Signed)
 Occupational Therapy Treatment Patient Details Name: Roger Black MRN: [id number] DOB: April 07, 1956 Today's Date: 05/15/2024   History of present illness Pt is a 68 y.o. male who presented 04/28/24 with back and leg pain. 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 S/p LP 10/7 & 10/9, which showed atypical cells with predominant lymphocytes but was not conclusive for malignancy. Brain MRI showed circumscribed enhancing lesions in the right frontal lobe and left cerebral hemisphere concerning for metastatic disease. Transferred to [facility] from [facility] 10/14. S/p R crani for resection of brain tumor 10/16. PMH: asthma, arthritis, aortic atherosclerosis, COPD, CHF, CKD stage 3a, CAD, HTN, HLD, sleep apnea, squamous cell carcinoma in situ   OT comments  Pt supine in bed and agreeable to OT session. Pt reporting diplopia today, voicing since surgery but only mentioning it today to therapist.  Diplopia in far gaze, all directions, WFL near vision.  Utilized partial occlusion tape for distance, pt with improved vision at distance.  Educated on use of partial occulusion glasses and will continue to assess needs.  Mobilized to EOB with min assist, but sitting EOB limited by LE discomfort and posterior lean; returned to chair position in bed for pt to eat breakfast.  Continue to recommend >3hrs/day inpatient setting at dc.  Will follow.       If plan is discharge home, recommend the following:  A lot of help with bathing/dressing/bathroom;A little help with walking and/or transfers;Assistance with cooking/housework;Direct supervision/assist for medications management;Direct supervision/assist for financial management;Assist for transportation;Help with stairs or ramp for entrance;Supervision due to cognitive status   Equipment Recommendations  BSC/3in1    Recommendations  for Other Services Rehab consult    Precautions / Restrictions Precautions Precautions: Fall Recall of Precautions/Restrictions: Impaired Precaution/Restrictions Comments: BP <150 Restrictions Weight Bearing Restrictions Per Provider Order: No       Mobility Bed Mobility Overal bed mobility: Needs Assistance Bed Mobility: Rolling, Sidelying to Sit, Sit to Sidelying Rolling: Supervision Sidelying to sit: Min assist     Sit to sidelying: Min assist General bed mobility comments: HOB elevated and cueing for technique, incrased assist once upright due to discomfort in legs and posterior lean.    Transfers                         Balance Overall balance assessment: Needs assistance Sitting-balance support: Bilateral upper extremity supported, No upper extremity supported, Feet supported Sitting balance-Leahy Scale: Poor Sitting balance - Comments: posterior lean due to discomfort in B LEs and needing at least min-mod assist for safety.                                   ADL either performed or assessed with clinical judgement   ADL Overall ADL's : Needs assistance/impaired Eating/Feeding: Set up;Sitting;Bed level Eating/Feeding Details (indicate cue type and reason): chair position in bed Grooming: Set up;Sitting                               Functional mobility during ADLs: Minimal assistance General ADL Comments: to EOB only    Extremity/Trunk Assessment Upper Extremity Assessment Upper Extremity Assessment: Generalized weakness   Lower Extremity Assessment Lower Extremity Assessment: Defer to PT evaluation  Vision   Vision Assessment?: Yes Eye Alignment: Within Functional Limits Ocular Range of Motion: Within Functional Limits Tracking/Visual Pursuits: Able to track stimulus in all quads without difficulty Diplopia Assessment: Disappears with one eye closed;Objects split side to side;Present in far gaze Additional  Comments: pt reporting today Ive had double vision since my surgery, I probably should have said something sooner.  Assessed by therapist, with noted diplopia at far gaze side by side and improved with 1 eye closed. Near vision and depth perception during ADLs WFL.  Utilized partial occlusion tape and pt reports improved vision at distance.  Educated patient and Charity fundraiser. will continue to assess.   Perception     Praxis     Communication Communication Communication: No apparent difficulties   Cognition Arousal: Alert Behavior During Therapy: Lability Cognition: No family/caregiver present to determine baseline             OT - Cognition Comments: pt alert, decreased awareness and safety and problem solving.  pt following simple commands with increased time but requires redirection for attention and safety.                 Following commands: Impaired Following commands impaired: Follows one step commands with increased time, Follows multi-step commands inconsistently      Cueing   Cueing Techniques: Verbal cues, Tactile cues  Exercises      Shoulder Instructions       General Comments assessed vision from chair position in bed    Pertinent Vitals/ Pain       Pain Assessment Pain Assessment: Faces Faces Pain Scale: Hurts even more Pain Location: LEs sitting EOB Pain Descriptors / Indicators: Discomfort, Grimacing, Guarding Pain Intervention(s): Limited activity within patient's tolerance, Monitored during session, Repositioned  Home Living                                          Prior Functioning/Environment              Frequency  Min 2X/week        Progress Toward Goals  OT Goals(current goals can now be found in the care plan section)  Progress towards OT goals: Progressing toward goals  Acute Rehab OT Goals Patient Stated Goal: keep moving OT Goal Formulation: With patient Time For Goal Achievement: 05/26/24 Potential to  Achieve Goals: Good  Plan      Co-evaluation                 AM-PAC OT 6 Clicks Daily Activity     Outcome Measure   Help from another person eating meals?: A Little Help from another person taking care of personal grooming?: A Little Help from another person toileting, which includes using toliet, bedpan, or urinal?: A Lot Help from another person bathing (including washing, rinsing, drying)?: A Lot Help from another person to put on and taking off regular upper body clothing?: A Little Help from another person to put on and taking off regular lower body clothing?: A Lot 6 Click Score: 15    End of Session    OT Visit Diagnosis: Muscle weakness (generalized) (M62.81);Other abnormalities of gait and mobility (R26.89);Pain;Low vision, both eyes (H54.2) Pain - Right/Left:  (legs)   Activity Tolerance Patient limited by pain   Patient Left in bed;with call bell/phone within reach;with bed alarm set   Nurse Communication Mobility status;Precautions;Other (comment) (diplopia)  Time: 9086-9060 OT Time Calculation (min): 26 min  Charges: OT General Charges $OT Visit: 1 Visit OT Treatments $Therapeutic Activity: 23-37 mins  Etta NOVAK, OT Acute Rehabilitation Services Office [phone] Secure Chat Preferred    Etta GORMAN [place] 05/15/2024, 11:06 AM

## 2024-05-15 NOTE — Discharge Summary (Signed)
 Physician Discharge Summary  Roger Black FMW:[id number] DOB: 1955-09-09 DOA: 05/07/2024  PCP: [name], MD  Admit date: 05/07/2024 Discharge date: 05/15/2024  Admitted From: home Disposition:  CIR  Recommendations for Outpatient Follow-up:  Follow up with PCP in 1-2 weeks  Home Health: none Equipment/Devices: none  Discharge Condition: stable CODE STATUS: DNR Diet Orders (From admission, onward)     Start     Ordered   05/12/24 0944  Diet regular Fluid consistency: Thin  Diet effective now       Question:  Fluid consistency:  Answer:  Thin   05/12/24 9056            Brief Narrative / Interim history: Sheridan Gettel is a 68 year old male with a past medical history significant for HFrEF, lung cancer s/p radiation December 2024, COPD, CKD, and HTN presented to [facility] on 04/28/2024 with complaints of leg pain.  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  Patient then underwent right craniotomy on 10/16 by Dr. [name]. Postoperatively patient was admitted to ICU with PCCM consulted for medical management.   Sequence of events in the hospital   10/06 initially presented to [facility] for intractable  leg pain MRI lumbar spine revealed multilevel stenosis with enhancement of several nerve roots of the cauda equina without nodular masslike enhancements 10/7 neurosurgery evaluated with recommendations of LP, interventional radiology performed LP with approximately 20 mL clear colorless fluid obtained.  Opening pressure 22 closing pressure 9 10/8 meningitis/encephalitis panel negative Acyclovir and ampicillin discontinued 10/9 LP repeated per neurology recommendations, flow cytometry and cytology with cellblock preparation recommended 10/13 MRI brain completed and revealed circumscribed enhancing lesion in the right frontal lobe  measuring 14 x 13 x 13 and cerebellar hemisphere 17 x 15 x 14 concerning for metastatic disease associated with vasogenic edema 10/15 transferred to [facility] for neurosurgery evaluation 10/16 underwent right craniotomy for resection of brain tumor per Dr. 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  Neurosurgery consulted, evaluated patient, he underwent craniotomy with tumor resection and was placed on dexamethasone.  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  Continue Decadron 4 mg every 8 hourly.  He is starting radiation therapy today History of COPD - Continue bronchodilators, respiratory status stable History of CAD - Continue statins ARB, no chest pain Essential hypertension - Continue irbesartan.  Blood pressure stable Hyperlipidemia - Continue statin HFrEF -Review of recent 2D echocardiogram from August 2025 with EF of 45%.  Continue statins, irbesartan.  Appears compensated at this time CKD stage IIIa - Baseline Cr appears to be around 1.1-1.3.  Creatinine at baseline, 1.1 Goals of care - DNR.  Palliative care on board. Debility deconditioning - Patient has been seen by physical therapy and recommended CIR.   Sepsis ruled out   Discharge Instructions  Discharge Instructions     Amb  Referral to Palliative Care    Complete by: As directed       Allergies as of 05/15/2024   No Known Allergies      Medication List     PAUSE taking these medications    spironolactone 50 MG tablet Wait to take this until your doctor or other care provider tells you to start again. Commonly known as: ALDACTONE Take 50 mg by mouth daily.   torsemide 100 MG tablet Wait to take this until your doctor or other care provider tells you to start again. Commonly known as: DEMADEX Take 100 mg by mouth daily as needed (edema).       STOP taking these medications    HYDROcodone-acetaminophen 5-325 MG tablet Commonly known as: NORCO/VICODIN   morphine (PF) 2 MG/ML injection       TAKE these medications    acetaminophen 325 MG tablet Commonly known as: TYLENOL Take 2 tablets (650 mg total) by mouth every 6 (six) hours as needed for mild pain (pain score 1-3) or fever.   albuterol 108 (90 Base) MCG/ACT inhaler Commonly known as: VENTOLIN HFA Inhale 2 puffs into the lungs every 4 (four) hours as needed for shortness of breath.   aspirin EC 81 MG tablet Take 81 mg by mouth daily.   atorvastatin 80 MG tablet Commonly known as: LIPITOR Take 80 mg by mouth daily.   budesonide-formoterol 160-4.5 MCG/ACT inhaler Commonly known as: SYMBICORT Inhale 2 puffs into the lungs 2 (two) times daily.   dexamethasone 4 MG tablet Commonly known as: DECADRON Take 1 tablet (4 mg total) by mouth every 8 (eight) hours.   doxazosin 2 MG tablet Commonly known as: CARDURA Take 2 mg by mouth at bedtime.   DULoxetine 60 MG capsule Commonly known as: CYMBALTA Take 1 capsule (60 mg total) by mouth daily.   ipratropium 0.03 % nasal spray Commonly known as: ATROVENT Place into the nose.   methocarbamol 500 MG tablet Commonly known as: ROBAXIN Take 1 tablet (500 mg total) by mouth 4 (four) times daily.   montelukast 10 MG tablet Commonly known as: SINGULAIR Take 10 mg by mouth daily.   olmesartan 40 MG  tablet Commonly known as: BENICAR Take 40 mg by mouth daily.   ondansetron 4 MG/2ML Soln injection Commonly known as: ZOFRAN Inject 2 mLs (4 mg total) into the vein every 8 (eight) hours as needed for nausea or vomiting.   oxyCODONE-acetaminophen 5-325 MG tablet Commonly known as: PERCOCET/ROXICET Take 1 tablet by mouth every 6 (six) hours.   pantoprazole 40 MG tablet Commonly known as: PROTONIX Take 40 mg by mouth daily.   pregabalin 150 MG capsule Commonly known as: LYRICA Take 1 capsule (150 mg total) by mouth 3 (three) times daily.   zolpidem 5 MG tablet Commonly known as: AMBIEN Take 1 tablet (5 mg total) by mouth at bedtime as needed for sleep.        Follow-up Information     [name], MD Follow up.   Specialty: Internal Medicine Contact information: [address] [facility] [name] [address] [phone]         [name] Aran, MD Follow up in 1 week(s).   Specialty: Radiation Oncology Contact information: [address]   [address]   [name] [address] [phone]                 Consultations: Neurosurgery  Radiation oncology   Procedures/Studies:  CT HEAD WO CONTRAST  Result Date: 05/09/2024 EXAM: CT HEAD WITHOUT CONTRAST 05/09/2024 12:42:50 AM TECHNIQUE: CT of the head was performed without the administration of intravenous contrast. Automated exposure control, iterative reconstruction, and/or weight based adjustment of the mA/kV was utilized to reduce the radiation dose to as low as reasonably achievable. COMPARISON: MRI from 05/05/2024. CLINICAL HISTORY: Brain tumor. FINDINGS: BRAIN AND VENTRICLES: Postoperative changes from interval right sided craniotomy for tumor resection. Postoperative pneumocephalus overlies the right cerebral convexity. Trace extra axial hemorrhage measures up to 4 mm without mass effect. Small volume blood products present within the resection cavity at the right temporal  lobe. Residual mild localized vasogenic edema. Evaluation for residual tumor dislocation limited on this noncontrast CT. Additional previously identified left cerebellar lesion noted, stable. No other acute intracranial hemorrhage. No other acute larger vessel territory infarct. No hydrocephalus. No abnormal hyperdense vessel. Calcified atherosclerosis present about the skull base. No mass effect or midline shift. ORBITS: No acute abnormality. SINUSES: Large left mastoid and middle ear effusion. SOFT TISSUES AND SKULL: Post craniotomy changes on the right. Overlying scalp soft tissue swelling with skin staples in place. No skull fracture. IMPRESSION: 1. Postoperative changes from interval right-sided craniotomy for tumor resection. Small volume blood products within the resection cavity at the right temporal lobe. 2. Trace postoperative extra-axial hemorrhage overlying the right cerebral calvarium convexity without significant mass effect. 3. Known left cerebellar lesion, grossly stable from prior. Electronically signed by: Morene Hoard MD 05/09/2024 01:12 AM EDT RP Workstation: [id number]   CT CHEST ABDOMEN PELVIS W CONTRAST Result Date: 05/06/2024 CLINICAL DATA:  Non-small cell lung cancer, staging. * Tracking Code: BO * EXAM: CT CHEST, ABDOMEN, AND PELVIS WITH CONTRAST TECHNIQUE: Multidetector CT imaging of the chest, abdomen and pelvis was performed following the standard protocol during bolus administration of intravenous contrast. RADIATION DOSE REDUCTION: This exam was performed according to the departmental dose-optimization program which includes automated exposure control, adjustment of the mA and/or kV according to patient size and/or use of iterative reconstruction technique. CONTRAST:  OMNIPAQUE  IOHEXOL  300 MG/ML  SOLN COMPARISON:  Chest CT 11/05/2023 and PET-CT 04/30/2023. Whole-body bone scan 05/06/2024. FINDINGS: CT CHEST FINDINGS Cardiovascular: No acute vascular findings.  Atherosclerosis of the aorta, great vessels and coronary arteries. Stable mild dilatation of the distal thoracic arch. The heart size is normal. There is no pericardial effusion. Mediastinum/Nodes: There are no enlarged mediastinal, hilar or axillary lymph nodes. The thyroid  gland, trachea and esophagus demonstrate no significant findings. Lungs/Pleura: No pleural effusion or pneumothorax. Moderate centrilobular and paraseptal emphysema again noted. Previously demonstrated nodule medially at the left lung apex appears slightly smaller, measuring 8 x 5 mm on image 31/4 (previously 10 x 8 mm). This is partly obscured by adjacent pleural thickening. No confluent airspace disease or enlarging nodules identified. There is mild atelectasis along the superior aspect of the right major fissure. Musculoskeletal/Chest wall: No chest wall mass or suspicious osseous findings. Stable chronic mild anterior wedging at T12 with chronic interspinous widening at T11-12, suggesting remote injury. CT ABDOMEN AND PELVIS FINDINGS Hepatobiliary: The liver has a non cirrhotic morphology and demonstrates no suspicious focal abnormalities. Probable cyst medially in the right lobe measures 1.7 cm on image 58/2, unchanged. There are additional smaller lesions which are not well seen previously due to lack of contrast on prior studies, but are likely cysts. No evidence of gallstones, gallbladder wall thickening or biliary dilatation. Pancreas: Unremarkable. No pancreatic ductal dilatation or surrounding inflammatory changes. Spleen: Normal in size without focal abnormality. Adrenals/Urinary Tract:  The adrenal glands appear stable with unchanged mild thickening of the left gland, without hypermetabolic activity on PET-CT. Small nonobstructing calculus in the interpolar region of the right kidney. No evidence of hydronephrosis or ureteral calculus. Stable small right renal cysts for which no specific follow-up imaging is recommended. The bladder  appears normal for its degree of distention. Stomach/Bowel: Enteric contrast has passed into the distal small bowel. Nonspecific diffuse gastric wall thickening, similar to prior studies. No evidence of bowel distension, wall thickening or surrounding inflammation. The appendix appears normal. Mildly prominent stool throughout the colon. Postsurgical changes in the proximal descending colon consistent with partial colon resection and anastomosis. Vascular/Lymphatic: There are no enlarged abdominal or pelvic lymph nodes. No acute vascular findings. Aortic and branch vessel atherosclerosis without evidence of aneurysm or large vessel occlusion. 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 Reproductive: The prostate gland and seminal vesicles appear unremarkable. Other: Small fat containing left inguinal hernia. No ascites, peritoneal nodularity or pneumoperitoneum. Musculoskeletal: No acute or significant osseous findings. Mild spondylosis. IMPRESSION: 1. Slight decrease in size of left apical pulmonary nodule consistent with response to therapy. 2. No evidence of metastatic disease in the chest, abdomen or pelvis. 3. Stable nonspecific thickening of the left adrenal gland without hypermetabolic activity on PET-CT. 4. Nonspecific diffuse gastric wall thickening, similar to prior studies. 5. Aortic Atherosclerosis (ICD10-I70.0) and Emphysema (ICD10-J43.9). Electronically Signed   By: Elsie Perone M.D.   On: 05/06/2024 15:58   NM Bone Scan Whole Body Result Date: 05/06/2024 CLINICAL DATA:  Metastatic disease evaluation. History of lung cancer. Recent falls. EXAM: NUCLEAR MEDICINE WHOLE BODY BONE SCAN TECHNIQUE: Whole body anterior and posterior images were obtained approximately 3 hours after intravenous injection of radiopharmaceutical. RADIOPHARMACEUTICALS:  21.83 mCi Technetium-29m MDP IV COMPARISON:  PET-CT 04/30/2023. CT of the chest, abdomen and pelvis 05/06/2024. FINDINGS: There is no osseous uptake  suspicious for metastatic disease. Scattered mild degenerative uptake is demonstrated in the shoulders, wrists, knees and feet. There is mild degenerative uptake in the spine. Soft tissue activity is within physiologic limits. IMPRESSION: No evidence of osseous metastatic disease. Electronically Signed   By: Elsie Perone M.D.   On: 05/06/2024 15:43   MR BRAIN W WO CONTRAST Result Date: 05/06/2024 EXAM: MRI BRAIN WITH AND WITHOUT CONTRAST 05/05/2024 09:15:52 PM TECHNIQUE: Multiplanar multisequence MRI of the head/brain was performed with and without the administration of 10 mL gadobutrol (GADAVIST) 1 MMOL/ML injection. COMPARISON: None available. CLINICAL HISTORY: Abnormal lumbar and thoracic leptomeningeal enhancement. Known cancer patient. FINDINGS: BRAIN AND VENTRICLES: No acute infarct. No acute intracranial hemorrhage. No midline shift. No hydrocephalus. The sella is unremarkable. Normal flow voids. There are circumscribed enhancing lesions present within the right frontal lobe and left cerebellar hemisphere, which are concerning for metastatic disease. The lesion within the right frontal lobe is seen on image 91 of series 20 and measures approximately 14 x 13 x 13 mm. The lesion within the left cerebellar hemisphere is seen on image 54, measuring approximately 17 x 15 x 14 mm. There is moderate vasogenic edema associated with the left cerebellar lesion but there is no significant mass effect. There is also vasogenic edema associated with the lesion in the right frontal lobe, also without significant mass effect. There is a focal area of gliosis in the periventricular and deep white matter of the left frontal lobe which is nonenhancing. There are also scattered foci of increased T2 signal throughout the cerebral white matter bilaterally. There is no abnormal leptomeningeal enhancement demonstrated.  ORBITS: No acute abnormality. SINUSES: No acute abnormality. BONES AND SOFT TISSUES: Normal bone marrow  signal and enhancement. No acute soft tissue abnormality. IMPRESSION: 1. Circumscribed enhancing lesions in the right frontal lobe (14 x 13 x 13 mm) and left cerebellar hemisphere (17 x 15 x 14 mm), concerning for metastatic disease, with associated vasogenic edema but no significant mass effect. 2. No abnormal leptomeningeal enhancement. Electronically signed by: Evalene Coho MD 05/06/2024 04:34 AM EDT RP Workstation: [id number]   MR THORACIC SPINE W WO CONTRAST Result Date: 05/02/2024 EXAM: MRI THORACIC SPINE WITHOUT AND WITH INTRAVENOUS CONTRAST 05/02/2024 12:43:50 PM TECHNIQUE: Multiplanar multisequence MRI of the thoracic spine was performed without and with the administration of 10 mL of gadobutrol (GADAVIST) 1 MMOL/ML injection. COMPARISON: CT chest 11/05/2023. CLINICAL HISTORY: CSF leak/Spontaneous intracranial hypotension suspected. Leg weakness with fall; patient states muscle spasms. Abnormal enhancement of cauda equina nerve roots on recent lumbar spine MRI. FINDINGS: BONES AND ALIGNMENT: Straightening of the thoracic spine. Grade 1 anterolisthesis of C7 on T1. Trace anterolisthesis of T10 on T11. Focal kyphosis at T11-T12. Mild chronic anterior wedging of the T12 vertebral body. No acute fracture, suspicious marrow lesion, or evidence of discitis. SPINAL CORD: Prominent linear enhancement along the surface of the thoracic spinal cord. This is greatest in the lower thoracic spine and at least partly vascular but is more prominent than is typically seen, and there are also areas of discontinuous enhancement along the dorsal surface of the cord at T1 (better demonstrated on the contemporaneous cervical spine MRI), T3-T4, and T4-T5 which are not clearly vascular. No evidence of cord edema or abnormal intramedullary enhancement. SOFT TISSUES: Emphysema. Small cyst in the right hepatic lobe. DEGENERATIVE CHANGES: Mild diffuse thoracic disc degeneration, greatest in the lower thoracic spine. Facet  arthrosis which is asymmetrically advanced on the right in the upper thoracic spine and lower thoracic spine and on the left in the mid thoracic spine. Disc bulging at T11-T12 results in slight ventral cord flattening without significant stenosis. Widely patent spinal canal elsewhere. No compressive neural foraminal stenosis. IMPRESSION: 1. Abnormal linear enhancement along the surface of the thoracic spinal cord. In combination with the recent lumbar spine MRI findings, this raises concern for leptomeningeal metastatic disease or an inflammatory or infectious process. CSF analysis is pending. 2. Thoracic disc and facet degeneration without high-grade stenosis. Electronically signed by: Dasie Hamburg MD 05/02/2024 01:46 PM EDT RP Workstation: [id number]   MR CERVICAL SPINE W WO CONTRAST Result Date: 05/02/2024 EXAM: MRI CERVICAL SPINE WITH AND WITHOUT CONTRAST 05/02/2024 12:43:50 PM TECHNIQUE: Multiplanar multisequence MRI of the cervical spine was performed without and with the administration of 10 mL gadobutrol (GADAVIST) 1 MMOL/ML injection. COMPARISON: MRI cervical spine 02/22/2022. CLINICAL HISTORY: CSF leak suspected/ Spontaneous intracranial hypotension. Leg weakness with fall; patient states muscle spasms. FINDINGS: BONES AND ALIGNMENT: Straightening of the normal cervical lordosis. No fracture, suspicious marrow lesion, or evidence of discitis. Moderate disc space narrowing from C3-C4 through C7-T1 with associated predominantly chronic degenerative endplate changes. SPINAL CORD: Normal spinal cord signal. No definite abnormal intradural enhancement in the cervical spine within limitations of artifact. SOFT TISSUES: No paraspinal mass. C2-C3: Negative. C3-C4: Mild disc bulging, a left foraminal disc protrusion, and uncovertebral spurring result in severe left neural foraminal stenosis without spinal stenosis, unchanged. C4-C5: Disc bulging and uncovertebral spurring result in mild spinal stenosis and  moderate to severe bilateral neural foraminal stenosis, unchanged. C5-C6: Disc bulging and uncovertebral spurring result in mild spinal stenosis and moderate to severe  bilateral neural foraminal stenosis, unchanged. C6-C7: Disc bulging and uncovertebral spurring result in mild spinal stenosis and severe bilateral neural foraminal stenosis, unchanged. C7-T1: Anterolisthesis with bulging of uncovered disc, a larger central/right central disc extrusion with cephalad migration, uncovertebral spurring, and severe facet arthrosis result in increased, moderate spinal stenosis and severe right greater than left neural foraminal stenosis. IMPRESSION: 1. No abnormal intradural enhancement in the cervical spine within limitations of motion artifact. 2. Increased, moderate spinal stenosis and severe right greater than left neural foraminal stenosis at C7-T1. 3. Unchanged cervical disc degeneration elsewhere resulting in up to mild spinal stenosis and severe neural foraminal stenosis. Electronically signed by: Dasie Hamburg MD 05/02/2024 01:31 PM EDT RP Workstation: [id number]   DG FL GUIDED LUMBAR PUNCTURE Result Date: 05/01/2024 CLINICAL DATA:  Provided history: Leptomeningitis, carcinomatous. Request received for fluoroscopically- guided lumbar puncture for additional CSF analysis. EXAM: LUMBAR PUNCTURE UNDER FLUOROSCOPY FLUOROSCOPY: Radiation Exposure Index (as provided by the fluoroscopic device): 13.90 mGy Kerma PROCEDURE: The patient was positioned prone on the fluoroscopy table. An appropriate skin entry site was determined under fluoroscopy and marked. The operator donned sterile gloves and a mask. The lower back was prepped and draped in the usual sterile fashion. Local anesthesia was provided with 1% lidocaine. Under intermittent fluoroscopy, lumbar puncture was performed at the L5-S1 level using a 22 gauge spinal needle with return of clear/colorless CSF and an opening pressure of 17 cm water. Only 6 mL of CSF could  be collected at this level. Subsequently, with the patient's consent and after infiltrating locally with 1% lidocaine, fluoroscopically-guided lumbar puncture was performed at the L4-L5 level using a 20 gauge spinal needle. There was return of clear/colorless CSF and an additional 16 mL of CSF were collected, for a total of 22 mL. All needles were removed. Dressings were applied to the skin entry sites. The patient tolerated the procedure well and no immediate post-procedure complication was apparent. The procedure was performed by Carlin Griffon, PA-C, supervised by Dr. Rockey Childs. IMPRESSION: 1. Fluoroscopically-guided lumbar punctures at the L5-S1 and L4-L5 levels, as described. 2. Opening pressure: 17 cm water. 3. 22 mL of CSF collected and sent for laboratory studies. 4. No immediate post-procedure complication. Electronically Signed   By: Rockey Childs D.O.   On: 05/01/2024 16:56   DG FL GUIDED LUMBAR PUNCTURE Result Date: 04/29/2024 CLINICAL DATA:  68 year old male with 2-3 weeks of progressively worsening aching/burning pain in bilateral legs. Denies any changes in gait or bowel or bladder incontinence. Recent MR lumbar spine with enhancement of several nerve roots of the cauda equina possibly secondary to underlying infectious or inflammatory process. Request for lumbar puncture. EXAM: LUMBAR PUNCTURE UNDER FLUOROSCOPY PROCEDURE: An appropriate skin entry site was determined fluoroscopically. Operator donned sterile gloves and mask. Skin site was marked, then prepped with Betadine, draped in usual sterile fashion, and infiltrated locally with 1% lidocaine. A 20 gauge spinal needle advanced into the thecal sac at L5-S1 from a left interlaminar approach. Clear colorless CSF spontaneously returned, with opening pressure of 22 cm water. 23 ml CSF were collected and divided among 4 sterile vials for the requested laboratory studies. Closing pressure of 9 cm water. The needle was then removed. The patient  tolerated the procedure well and there were no complications. FLUOROSCOPY: Radiation Exposure Index (as provided by the fluoroscopic device): 17.5 mGy Kerma IMPRESSION: Technically successful lumbar puncture under fluoroscopy. This exam was performed by [facility] PA-C, and was supervised and interpreted by Dr. Harrietta Sherry. Electronically  Signed   By: Harrietta Sherry M.D.   On: 04/29/2024 16:55   MR LUMBAR SPINE WO CONTRAST Addendum Date: 04/26/2024 ** ADDENDUM #1 ** ADDENDUM: The study was performed without and with intravenous statin contrast. There is enhancement of several nerve roots of the cauda equina. There is no nodular or mass-like enhancement. This may be secondary to an underlying infectious or inflammatory process ---------------------------------------------------- Electronically signed by: Evalene Coho MD 04/26/2024 04:07 AM EDT RP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

## 2024-05-15 NOTE — Progress Notes (Signed)
 Inpatient Rehab Admissions Coordinator:    I have insurance approval and a bed available for pt to admit to CIR today. Dr. Trixie in agreement and [facility] aware.  I will notify pt/family and make arrangements.    Reche Lowers, PT, DPT Admissions Coordinator [phone] 05/15/24  10:43 AM

## 2024-05-15 NOTE — PMR Pre-admission (Signed)
 PMR Admission Coordinator Pre-Admission Assessment  Patient: Roger Black is an 68 y.o., male MRN: [id number] DOB: Sep 03, 1955 Height: 5' 9 (175.3 cm) Weight:    Insurance Information HMO:     PPO: yes     PCP:      IPA:      80/20:      OTHER:  PRIMARY: [employer]      Policy#: [id number]      Subscriber: pt CM Name: Madelin      Phone#: [phone]     Fax#: epic access Pre-Cert#: [id number] auth for CIR from Tammy with [facility] for admit 10/23 with next review date 10/29.  They have epic access for concurrent review.      Employer:  Benefits:  Phone #: [phone]-option 1     Name:  Eff. Date: 07/25/23     Deduct: $0      Out of Pocket Max: $3400 ($680.70 met)      Life Max: n/a CIR: $325/day for days 1-6      SNF: 20 full days Outpatient:      Co-Pay: $15/visit Home Health: 100%      Co-Pay:  DME: 75%     Co-Pay: 25% Providers:  SECONDARY:       Policy#:      Phone#:   Artist:       Phone#:   The Engineer, materials Information Summary" for patients in Inpatient Rehabilitation Facilities with attached "Privacy Act Statement-Health Care Records" was provided and verbally reviewed with: Patient and Family  Emergency Contact Information Contact Information     Name Relation Home Work Mobile   Robinson,Anita Spouse [phone]       Other Contacts   None on File     Current Medical History  Patient Admitting Diagnosis: 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 on 10/6 at the request of his physician following MRI whole spine for progressive intractable back pain.  MRI of whole spine concerning for abnormal linear enhancement along the surface of the thoracic spinal cord concerning for  leptomeningeal carcinomatosis.  Pt had LP which was ultimately inconclusive.  MRI brain w and w/o showed circumscribed enhancing lesions in the right frontal lobe and left cerebellar hemisphere with associate vasogenic edema and no MLS or mass effect.  CT chest/abd/pelvis and bone scan without evidence of primary malignancy.  He was transferred to [facility] on 10/15 for further neurosurgical consultation with Dr. [name] who recommended R sided crani for biopsy/subtotal resection as lesion was attached to a vessel and not amenable to total resection.  This was completed on 10/16.  Post op course pain management and dex taper.  Final pathology showed metastatic carcinoma from the tumor biopsies.  Nonspecific staining noted but overall it was felt to be metastatic carcinoma of lung origin.  Radiation oncology consulted and recommended 2 weeks of palliative radiotherapy to cauda equina for 10 fractions and 3 fractions of stereotactic radiosurgery to the brain.  He had a simulation for his spinal radiation on 10/22 with tx to start on 10/23 for 10 sessions, and will have simulation for his brain radiation on 10/27 with tx to start on 10/28 for 3 sessions.  Therapy ongoing and recommendations are for  CIR.     Patient's medical record from [facility] has been reviewed by the rehabilitation admission coordinator and physician.  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 Yes  Family History   family history includes Hypertension in his father and mother.  Current Medications  Current Facility-Administered Medications:    acetaminophen (TYLENOL) tablet 650 mg, 650 mg, Oral, Q6H PRN, 650 mg at 05/15/24 0602 **OR** acetaminophen (TYLENOL) suppository 650 mg, 650 mg, Rectal, Q6H PRN, [name], MD   atorvastatin (LIPITOR) tablet 80 mg, 80 mg, Oral, Daily, [name], MD, 80 mg at 05/15/24 9171   baclofen (LIORESAL) tablet 10 mg, 10 mg, Oral, BID, [name], [name], MD, 10 mg at 05/15/24 0827   butalbital-acetaminophen-caffeine (FIORICET) 50-325-40 MG per tablet 2 tablet, 2 tablet, Oral, Q4H PRN, [name], [name], MD, 2 tablet at 05/11/24 1854   Chlorhexidine Gluconate Cloth 2 % PADS 6 each, 6 each, Topical, Q0600, [name], [name], MD, 6 each at 05/14/24 1601   [EXPIRED] dexamethasone (DECADRON) tablet 6 mg, 6 mg, Oral, Q6H, 6 mg at 05/09/24 0510 **FOLLOWED BY** [EXPIRED] dexamethasone (DECADRON) tablet 4 mg, 4 mg, Oral, Q6H, 4 mg at 05/10/24 0613 **FOLLOWED BY** dexamethasone (DECADRON) tablet 4 mg, 4 mg, Oral, Q8H, [name], [name], MD, 4 mg at 05/15/24 0602   doxazosin (CARDURA) tablet 2 mg, 2 mg, Oral, QHS, [name], [name], MD, 2 mg at 05/14/24 2212   DULoxetine (CYMBALTA) DR capsule 60 mg, 60 mg, Oral, Daily, [name], MD, 60 mg at 05/15/24 0828   enoxaparin (LOVENOX) injection 40 mg, 40 mg, Subcutaneous, Daily, Pokhrel, Laxman, MD, 40 mg at 05/15/24 0829   fluticasone furoate-vilanterol (BREO ELLIPTA) 100-25 MCG/ACT 1 puff, 1 puff, Inhalation, Daily, [name], MD, 1 puff at 05/15/24 0758   irbesartan (AVAPRO) tablet 300 mg, 300 mg, Oral, Daily, [name], MD, 300 mg at 05/15/24 9171    labetalol (NORMODYNE) injection 10-40 mg, 10-40 mg, Intravenous, Q10 min PRN, [name], [name], MD, 20 mg at 05/09/24 1951   montelukast (SINGULAIR) tablet 10 mg, 10 mg, Oral, Daily, [name], MD, 10 mg at 05/15/24 0828   ondansetron (ZOFRAN) tablet 4 mg, 4 mg, Oral, Q4H PRN **OR** ondansetron (ZOFRAN) injection 4 mg, 4 mg, Intravenous, Q4H PRN, [name], [name], MD   Oral care mouth rinse, 15 mL, Mouth Rinse, PRN, Desai, Nikita S, MD   oxyCODONE (Oxy IR/ROXICODONE) immediate release tablet 5 mg, 5 mg, Oral, Q4H PRN, Desai, Nikita S, MD, 5 mg at 05/15/24 0738   pantoprazole (PROTONIX) EC tablet 40 mg, 40 mg, Oral, Daily, [name], MD, 40  mg at 05/15/24 0827   polyethylene glycol (MIRALAX / GLYCOLAX) packet 17 g, 17 g, Oral, BID, Pokhrel, Laxman, MD, 17 g at 05/15/24 0828   pregabalin (LYRICA) capsule 150 mg, 150 mg, Oral, TID, [name], MD, 150 mg at 05/15/24 9171   promethazine (PHENERGAN) tablet 12.5-25 mg, 12.5-25 mg, Oral, Q4H PRN, [name], [name], MD   senna-docusate (Senokot-S) tablet 2 tablet, 2 tablet, Oral, BID, Wilson, Tara N, PA-C, 2 tablet at 05/15/24 0827   white petrolatum (VASELINE) gel, , Topical, PRN, Desai, Nikita S, MD   zolpidem (AMBIEN) tablet 5 mg, 5 mg, Oral, QHS PRN, [name], MD, 5 mg at 05/12/24 2318  Patients Current Diet:  Diet Order             Diet regular Fluid consistency: Thin  Diet effective now                   Precautions / Restrictions Precautions Precautions: Fall Precaution/Restrictions Comments: BP <150 Restrictions Weight Bearing Restrictions Per Provider Order: No   Has the patient had 2 or more falls or a fall with injury in the past year? Yes  Prior Activity Level Community (5-7x/wk): independent, working, driving, no DME  Prior Functional Level Self Care: Did the patient need help bathing, dressing, using the toilet or eating? Independent  Indoor Mobility: Did the patient need  assistance with walking from room to room (with or without device)? Independent  Stairs: Did the patient need assistance with internal or external stairs (with or without device)? Independent  Functional Cognition: Did the patient need help planning regular tasks such as shopping or remembering to take medications? Independent  Patient Information Are you of Hispanic, Latino/a,or Spanish origin?: A. No, not of Hispanic, Latino/a, or Spanish origin What is your race?: A. White Do you need or want an interpreter to communicate with a doctor or health care staff?: 0. No  Patient's Response To:  Health Literacy and Transportation Is the patient able to respond to health literacy and transportation needs?: Yes Health Literacy - How often do you need to have someone help you when you read instructions, pamphlets, or other written material from your doctor or pharmacy?: Never In the past 12 months, has lack of transportation kept you from medical appointments or from getting medications?: No In the past 12 months, has lack of transportation kept you from meetings, work, or from getting things needed for daily living?: No  Home Assistive Devices / Equipment Home Equipment: [place] - single point  Prior Device Use: Indicate devices/aids used by the patient prior to current illness, exacerbation or injury? None of the above  Current Functional Level Cognition  Orientation Level: Oriented X4    Extremity Assessment (includes Sensation/Coordination)  Upper Extremity Assessment: Generalized weakness  Lower Extremity Assessment: Defer to PT evaluation    ADLs  Overall ADL's : Needs assistance/impaired Grooming: Set up, Sitting Upper Body Dressing : Contact guard assist, Sitting Lower Body Dressing: Moderate assistance, Sit to/from stand Toilet Transfer: Minimal assistance, +2 for physical assistance Toilet Transfer Details (indicate cue type and reason): bil hand held support at EOB Functional  mobility during ADLs: Minimal assistance, +2 for physical assistance    Mobility  Overal bed mobility: Needs Assistance Bed Mobility: Rolling, Sidelying to Sit, Sit to Sidelying Rolling: Supervision Sidelying to sit: Min assist, +2 for safety/equipment Supine to sit: Min assist, +2 for safety/equipment Sit to supine: HOB elevated, Min assist General bed mobility comments: increased time and  cueing for technique, minA for trunk elevation    Transfers  Overall transfer level: Needs assistance Equipment used: Rolling walker (2 wheels) Transfers: Sit to/from Stand Sit to Stand: Min assist, +2 physical assistance Step pivot transfers: Mod assist, +2 safety/equipment General transfer comment: minA to power up, pt with wide base of support, required significant time and 2 trials prior to standing, modA for step pvt from recliner to [facility] via HHA, max directional verbal cues    Ambulation / Gait / Stairs / Wheelchair Mobility  Ambulation/Gait Ambulation/Gait assistance: Min assist, +2 safety/equipment Gait Distance (Feet): 30 Feet Assistive device: Rolling walker (2 wheels) Gait Pattern/deviations: Decreased stride length, Wide base of support, Staggering right, Staggering left General Gait Details: pt with noted lateral sway L/R and ant/posterior, pt became more confused/delayed response however VSS, pt stating Im just trying to figure out what I'm doing, minA for walker management and 2nd person for chair follow and line management Gait velocity: decreased Gait velocity interpretation: <1.31 ft/sec, indicative of household ambulator    Posture / Balance Dynamic Sitting Balance Sitting balance - Comments: min guard Balance Overall balance assessment: Needs assistance Sitting-balance support: No upper extremity supported, Feet supported Sitting balance-Leahy Scale: Fair Sitting balance - Comments: min guard Standing balance support: Bilateral upper extremity supported, During functional  activity Standing balance-Leahy Scale: Poor Standing balance comment: relies on external support    Special considerations/life events  Skin surgical incision to R lateral head, Behavioral consideration s/p brain tumor resection (confusion, poor recall/safety, no agitation/aggression noted, and Special service needs Radiation scheduled 10/23 through 11/5 week days.  [facility] arranged through 10/31.  If pt remains on CIR after 10/31 will need CSW to arrange [facility] for the week of 11/3.     Previous Home Environment (from acute therapy documentation) Living Arrangements: Spouse/significant other Available Help at Discharge: Family, Available 24 hours/day Type of Home: House Home Layout: One level Home Access: Level entry Bathroom Shower/Tub: Engineer, manufacturing systems: Standard Home Care Services: No  Discharge Living Setting Plans for Discharge Living Setting: Patient's home, Lives with (comment) (spouse, [name]) Type of Home at Discharge: House Discharge Home Layout: One level Discharge Home Access: Level entry Discharge Bathroom Shower/Tub: Tub/shower unit Discharge Bathroom Toilet: Standard Discharge Bathroom Accessibility: Yes How Accessible: Accessible via walker Does the patient have any problems obtaining your medications?: No  Social/Family/Support Systems Patient Roles: Spouse Anticipated Caregiver: Jaelynn Currier Anticipated Caregiver's Contact Information: [phone] Ability/Limitations of Caregiver: none stated Caregiver Availability: 24/7 Discharge Plan Discussed with Primary Caregiver: Yes Is Caregiver In Agreement with Plan?: Yes Does Caregiver/Family have Issues with Lodging/Transportation while Pt is in Rehab?: No  Goals Patient/Family Goal for Rehab: PT/OT/SLP Supervision Expected length of stay: 9-12 days Additional Information: Discharge plan: home with spouse who can provide 24/7 supervision Pt/Family Agrees to Admission and willing to participate:  Yes Program Orientation Provided & Reviewed with Pt/Caregiver Including Roles  & Responsibilities: Yes Additional Information Needs: Radiation txs scheduled, [facility] arranged for radiation 10/23 through 10/31, if pt remains after 10/31 CSW will need to arrange transport for 11/3 through 11/5  Decrease burden of Care through IP rehab admission: n/a  Possible need for SNF placement upon discharge: No.  Plan for discharge home with spouse to provide 24/7 supervision   Patient Condition: This patient's condition remains as documented in the consult dated 10/21, in which the Rehabilitation Physician determined and documented that the patient's condition is appropriate for intensive rehabilitative care in an inpatient rehabilitation facility. Will admit to  inpatient rehab today.  Preadmission Screen Completed By:  Caitlin E Warren, PT, DPT 05/15/2024 10:00 AM ______________________________________________________________________   Discussed status with Dr. Emeline on 05/15/24  at 10:43 AM  and received approval for admission today.  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 Yes Co-Morbidities requiring supervision/potential complications: COPD, HFrEF, CJD stage IIIa, HTN, poor pain control, anxiety Due to safety, skin/wound care, disease management, medication administration, pain management, and patient education, does the patient require 24 hr/day rehab nursing? Yes Does the patient require coordinated care of a physician, rehab nurse, PT, OT, and SLP to address physical and functional deficits in the context of the above medical diagnosis(es)? Yes Addressing  deficits in the following areas: balance, endurance, locomotion, strength, transferring, bathing, dressing, feeding, grooming, toileting, and cognition Can the patient actively participate in an intensive therapy program of at least 3 hrs of therapy 5 days a week? Yes The potential for patient to make measurable gains while on inpatient rehab is good Anticipated functional outcomes upon discharge from inpatient rehab: supervision PT, supervision OT, supervision SLP Estimated rehab length of stay to reach the above functional goals is: 9-12 days Anticipated discharge destination: Home 10. Overall Rehab/Functional Prognosis: good   MD Signature:  Joesph JAYSON Emeline, DO 05/15/2024

## 2024-05-15 NOTE — TOC Transition Note (Signed)
 Transition of Care [facility]) - Discharge Note   Patient Details  Name: Roger Black MRN: [id number] Date of Birth: 10/30/1955  Transition of Care [facility]) CM/SW Contact:  Inocente GORMAN Kindle, LCSW Phone Number: 05/15/2024, 2:35 PM   Clinical Narrative:    Patient discharging to CIR today. No further inpatient care management needs identified at this time.    Final next level of care: IP Rehab Facility Barriers to Discharge: Barriers Resolved   Patient Goals and CMS Choice            Discharge Placement                    Patient and family notified of of transfer: 05/15/24  Discharge Plan and Services Additional resources added to the After Visit Summary for                                       Social Drivers of Health (SDOH) Interventions SDOH Screenings   Food Insecurity: No Food Insecurity (05/07/2024)  Housing: High Risk (05/07/2024)  Transportation Needs: No Transportation Needs (05/07/2024)  Utilities: Not At Risk (05/07/2024)  Financial Resource Strain: Low Risk  (03/07/2024)   Received from [facility]  Recent Concern: Financial Resource Strain - High Risk (01/29/2024)   Received from [facility]  Physical Activity: Inactive (08/07/2023)   Received from [facility]  Social Connections: Unknown (05/07/2024)  Stress: Stress Concern Present (08/07/2023)   Received from [facility]  Tobacco Use: Medium Risk (05/08/2024)  Health Literacy: Adequate Health Literacy (08/07/2023)   Received from [facility]     Readmission Risk Interventions     No data to display

## 2024-05-16 ENCOUNTER — Encounter (HOSPITAL_COMMUNITY): Payer: Self-pay | Admitting: Physical Medicine and Rehabilitation

## 2024-05-16 ENCOUNTER — Other Ambulatory Visit: Payer: Self-pay

## 2024-05-16 ENCOUNTER — Ambulatory Visit

## 2024-05-16 DIAGNOSIS — C3412 Malignant neoplasm of upper lobe, left bronchus or lung: Secondary | ICD-10-CM | POA: Diagnosis not present

## 2024-05-16 DIAGNOSIS — C7931 Secondary malignant neoplasm of brain: Secondary | ICD-10-CM | POA: Diagnosis not present

## 2024-05-16 DIAGNOSIS — Z87891 Personal history of nicotine dependence: Secondary | ICD-10-CM | POA: Diagnosis not present

## 2024-05-16 DIAGNOSIS — Z510 Encounter for antineoplastic radiation therapy: Secondary | ICD-10-CM | POA: Diagnosis present

## 2024-05-16 DIAGNOSIS — C7951 Secondary malignant neoplasm of bone: Secondary | ICD-10-CM | POA: Diagnosis not present

## 2024-05-16 LAB — COMPREHENSIVE METABOLIC PANEL WITH GFR
ALT: 24 U/L (ref 0–44)
AST: 20 U/L (ref 15–41)
Albumin: 3.7 g/dL (ref 3.5–5.0)
Alkaline Phosphatase: 53 U/L (ref 38–126)
Anion gap: 13 (ref 5–15)
BUN: 37 mg/dL — ABNORMAL HIGH (ref 8–23)
CO2: 23 mmol/L (ref 22–32)
Calcium: 9.5 mg/dL (ref 8.9–10.3)
Chloride: 95 mmol/L — ABNORMAL LOW (ref 98–111)
Creatinine, Ser: 1.14 mg/dL (ref 0.61–1.24)
GFR, Estimated: >60 mL/min (ref >60)
Glucose, Bld: 124 mg/dL — ABNORMAL HIGH (ref 70–99)
Potassium: 4.5 mmol/L (ref 3.5–5.1)
Sodium: 131 mmol/L — ABNORMAL LOW (ref 135–145)
Total Bilirubin: 0.9 mg/dL (ref 0.0–1.2)
Total Protein: 6.6 g/dL (ref 6.5–8.1)

## 2024-05-16 LAB — CBC WITH DIFFERENTIAL/PLATELET
Abs Immature Granulocytes: 0.07 K/uL (ref 0.00–0.07)
Basophils Absolute: 0 K/uL (ref 0.0–0.1)
Basophils Relative: 0 %
Eosinophils Absolute: 0 K/uL (ref 0.0–0.5)
Eosinophils Relative: 0 %
HCT: 44.8 % (ref 39.0–52.0)
Hemoglobin: 15.2 g/dL (ref 13.0–17.0)
Immature Granulocytes: 1 %
Lymphocytes Relative: 17 %
Lymphs Abs: 1.3 K/uL (ref 0.7–4.0)
MCH: 32.5 pg (ref 26.0–34.0)
MCHC: 33.9 g/dL (ref 30.0–36.0)
MCV: 95.7 fL (ref 80.0–100.0)
Monocytes Absolute: 0.4 K/uL (ref 0.1–1.0)
Monocytes Relative: 6 %
Neutro Abs: 6 K/uL (ref 1.7–7.7)
Neutrophils Relative %: 76 %
Platelets: 174 K/uL (ref 150–400)
RBC: 4.68 MIL/uL (ref 4.22–5.81)
RDW: 12.6 % (ref 11.5–15.5)
WBC: 7.8 K/uL (ref 4.0–10.5)
nRBC: 0 % (ref 0.0–0.2)

## 2024-05-16 LAB — RAD ONC ARIA SESSION SUMMARY
Course Elapsed Days: 1
Plan Fractions Treated to Date: 2
Plan Prescribed Dose Per Fraction: 3 Gy
Plan Total Fractions Prescribed: 10
Plan Total Prescribed Dose: 30 Gy
Reference Point Dosage Given to Date: 6 Gy
Reference Point Session Dosage Given: 3 Gy
Session Number: 2

## 2024-05-16 LAB — HEMOGLOBIN A1C
Hgb A1c MFr Bld: 5.7 % — ABNORMAL HIGH (ref 4.8–5.6)
Mean Plasma Glucose: 116.89 mg/dL

## 2024-05-16 MED ORDER — OXYCODONE HCL ER 10 MG PO T12A
10.0000 mg | EXTENDED_RELEASE_TABLET | Freq: Two times a day (BID) | ORAL | Status: DC
  Administered 2024-05-16 – 2024-05-17 (×3): 10 mg via ORAL
  Filled 2024-05-16 (×3): qty 1

## 2024-05-16 MED ORDER — TIZANIDINE HCL 4 MG PO TABS
2.0000 mg | ORAL_TABLET | Freq: Three times a day (TID) | ORAL | Status: DC
  Administered 2024-05-16 – 2024-06-03 (×54): 2 mg via ORAL
  Filled 2024-05-16 (×55): qty 1

## 2024-05-16 NOTE — Progress Notes (Signed)
 Met with patient to review current situation, team conference and plan of care. Reviewed pain, per patient  he continues to have pain on his legs and hips. Patient is aware of his radiation therapy schedule. Patient says I wish there is a quick cure for those cancer cells. Support given to patient. Continue to follow along to provide educational needs to facilitate preparation for discharge.

## 2024-05-16 NOTE — Progress Notes (Signed)
 Inpatient Rehabilitation  Patient information reviewed and entered into eRehab system by [facility]. Karen Kays., CCC/SLP, PPS Coordinator.  Information including medical coding, functional ability and quality indicators will be reviewed and updated through discharge.

## 2024-05-16 NOTE — Progress Notes (Signed)
 Babs Arthea DASEN, MD  Physician Physical Medicine and Rehabilitation   Consult Note    Signed   Date of Service: 05/13/2024 10:18 AM  Related encounter: Admission (Discharged) from 05/07/2024 in [facility] NEURO/TRAUMA/SURGICAL ICU   Signed     Expand All Collapse All           Physical Medicine and Rehabilitation Consult Reason for Consult:impaired functional mobility, pain Referring Physician: Pokhrel      HPI: Roger Black is a 68 y.o. male with a history of lung cancer, COPD, CHF, CKD, HTN who presented on 10/6 with back and bilateral leg pain. 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 Brain MRI demonstrated circumscribed enhancing lesions in the right frontal lobe and left cerebral hemisphere concerning for metastatic disease. Pt was transferred to [facility] for right sided craniotomy for excisional bx of insular tumor by Dr. [name]. Palliative care consulted for goals of care. Pt complains of severe back and leg pain post-op, placed on lyrica. Cognitive deficits noted by team. He worked with therapies yesterday and was min assist for sit-std and ambulated 5 steps forward and backward with mod A (HHA).  He was min asssit for very simple ADL's. Pt livs with his wife in a one level home with level entry. He was independent prior to admit.        Home: Home Living Family/patient expects to be discharged to:: Private residence Living Arrangements: Spouse/significant other Available Help at Discharge: Family, Available 24 hours/day Type of Home: House Home Access: Level entry Home Layout: One level Bathroom Shower/Tub: Engineer, manufacturing systems: Standard Home Equipment: Cane - single point  Functional History: Prior Function Prior Level of Function : Independent/Modified Independent, Working/employed Mobility Comments: indep with no AD ADLs Comments: MOD I-I with  ADL/IADL, works at [employer] Status:  Mobility: Bed Mobility Overal bed mobility: Needs Assistance Bed Mobility: Rolling, Sidelying to Sit, Sit to Sidelying Rolling: Supervision Sidelying to sit: Min assist, +2 for safety/equipment Supine to sit: Min assist, +2 for safety/equipment Sit to supine: HOB elevated, Min assist General bed mobility comments: increased time and cueing for technique, minA for trunk elevation Transfers Overall transfer level: Needs assistance Equipment used: 2 person hand held assist Transfers: Sit to/from Stand Sit to Stand: Min assist, +2 physical assistance General transfer comment: minA to power up, pt with wide base of support, required significant time and 2 trials prior to standing Ambulation/Gait General Gait Details: pt took 5 steps forwards and backwards with bilat HHA at [facility] level prior to noted bilat knee instability and fatigue   ADL: ADL Overall ADL's : Needs assistance/impaired Grooming: Set up, Sitting Upper Body Dressing : Contact guard assist, Sitting Lower Body Dressing: Moderate assistance, Sit to/from stand Toilet Transfer: Minimal assistance, +2 for physical assistance Toilet Transfer Details (indicate cue type and reason): bil hand held support at EOB Functional mobility during ADLs: Minimal assistance, +2 for physical assistance   Cognition: Cognition Orientation Level: Oriented to person, Oriented to situation, Disoriented to place, Disoriented to time Cognition Arousal: Alert Behavior During Therapy: Flat affect, Lability     Review of Systems  Unable to perform ROS: Mental acuity  Musculoskeletal:  Positive for back pain.       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    vertex scalp, treating with 5FU/calcipotriene             Past Surgical History:  Procedure Laterality Date   APPLICATION OF CRANIAL NAVIGATION Right 05/08/2024    Procedure: COMPUTER-ASSISTED NAVIGATION, FOR CRANIAL PROCEDURE;  Surgeon: [name] Dino HERO, MD;  Location: [facility] OR;  Service: Neurosurgery;  Laterality: Right;   COLON SURGERY       COLONOSCOPY WITH PROPOFOL  N/A 10/20/2020    Procedure: COLONOSCOPY WITH PROPOFOL ;  Surgeon: Toledo, Ladell POUR, MD;  Location: [facility] ENDOSCOPY;  Service: Gastroenterology;  Laterality: N/A;   CRANIOTOMY Right 05/08/2024    Procedure: CRANIOTOMY TUMOR EXCISION;  Surgeon: [name] Dino HERO, MD;  Location: [facility] OR;  Service: Neurosurgery;  Laterality: Right;             Family History  Problem Relation Age of Onset   Hypertension Mother     Hypertension Father          Social History:  reports that he has quit smoking. His smoking use included cigarettes. He has a 47 pack-year smoking history. He has never used smokeless tobacco. He reports that he does not drink alcohol  and does not use drugs. Allergies:  Allergies  No Known Allergies         Medications Prior to Admission  Medication Sig Dispense Refill   acetaminophen (TYLENOL) 325 MG tablet Take 2 tablets (650 mg total) by mouth every 6 (six) hours as needed for mild pain (pain score 1-3) or fever.       albuterol (VENTOLIN HFA) 108 (90 Base) MCG/ACT inhaler Inhale 2 puffs into the lungs every 4 (four) hours as needed for shortness of breath.       aspirin 81 MG EC tablet Take 81 mg by mouth daily.       atorvastatin (LIPITOR) 80 MG tablet Take  80 mg by mouth daily.       budesonide-formoterol (SYMBICORT) 160-4.5 MCG/ACT inhaler Inhale 2 puffs into the lungs 2 (two) times daily.       doxazosin (CARDURA) 2 MG tablet Take 2 mg by mouth at bedtime.       DULoxetine (CYMBALTA) 60 MG capsule Take 1 capsule (60 mg total) by mouth daily.       HYDROcodone-acetaminophen (NORCO/VICODIN) 5-325 MG tablet Take 1 tablet by mouth 2 (two) times daily as needed for moderate pain (pain score 4-6).       methocarbamol (ROBAXIN) 500 MG tablet Take 1 tablet (500 mg total) by mouth 4 (four) times daily.       montelukast (SINGULAIR) 10 MG tablet Take 10 mg by mouth daily.       olmesartan (BENICAR) 40 MG tablet Take 40 mg by mouth daily.       ondansetron (ZOFRAN) 4 MG/2ML SOLN injection Inject 2 mLs (4 mg total) into the vein every 8 (eight) hours as needed for nausea or vomiting.  oxyCODONE-acetaminophen (PERCOCET/ROXICET) 5-325 MG tablet Take 1 tablet by mouth every 6 (six) hours.       pantoprazole (PROTONIX) 40 MG tablet Take 40 mg by mouth daily.       pregabalin (LYRICA) 150 MG capsule Take 1 capsule (150 mg total) by mouth 3 (three) times daily.       [Paused] spironolactone (ALDACTONE) 50 MG tablet Take 50 mg by mouth daily.       [Paused] torsemide (DEMADEX) 100 MG tablet Take 100 mg by mouth daily as needed (edema).       zolpidem (AMBIEN) 5 MG tablet Take 1 tablet (5 mg total) by mouth at bedtime as needed for sleep.       ipratropium (ATROVENT) 0.03 % nasal spray Place into the nose.       Morphine Sulfate (MORPHINE, PF,) 2 MG/ML injection Inject 1 mL (2 mg total) into the vein every 4 (four) hours as needed (breakthrough pain). (Patient not taking: Reported on 05/07/2024)                Blood pressure 104/67, pulse 85, temperature 97.8 F (36.6 C), temperature source Oral, resp. rate 10, height 5' 9 (1.753 m), SpO2 97%. Physical Exam Constitutional:      General: He is in acute distress.  HENT:     Right Ear: External ear  normal.     Left Ear: External ear normal.     Mouth/Throat:     Mouth: Mucous membranes are moist.  Eyes:     Conjunctiva/sclera: Conjunctivae normal.  Cardiovascular:     Rate and Rhythm: Normal rate.     Pulses: Normal pulses.  Pulmonary:     Effort: Pulmonary effort is normal.  Abdominal:     Palpations: Abdomen is soft.  Musculoskeletal:        General: Tenderness present. No swelling.     Cervical back: Normal range of motion.  Skin:    General: Skin is warm.     Comments: Right temporal-parietal incision CDI with staples  Neurological:     Mental Status: He is alert.     Comments: Pt is alert, oriented to self, [facility]. Told me where he lived. Could not tell me why he was here. Followed basic commands. Speech was fairly clear. Language was fairly fluent.  Was quite distracted, mostly due to pain. Settled down a bit after we talked for a few minutes. CN exam was non-focal. Seemed to move all 4's. MMT was difficult d/t poor attention. Sensed pain in all 4's. No abnl resting tone.   Psychiatric:     Comments: Anxious, distracted       Lab Results Last 24 Hours       Results for orders placed or performed during the hospital encounter of 05/07/24 (from the past 24 hours)  CBC     Status: None    Collection Time: 05/13/24  2:44 AM  Result Value Ref Range    WBC 6.7 4.0 - 10.5 K/uL    RBC 4.32 4.22 - 5.81 MIL/uL    Hemoglobin 14.0 13.0 - 17.0 g/dL    HCT 57.8 60.9 - 47.9 %    MCV 97.5 80.0 - 100.0 fL    MCH 32.4 26.0 - 34.0 pg    MCHC 33.3 30.0 - 36.0 g/dL    RDW 87.6 88.4 - 84.4 %    Platelets 187 150 - 400 K/uL    nRBC 0.0 0.0 - 0.2 %  Basic metabolic panel  Status: Abnormal    Collection Time: 05/13/24  2:44 AM  Result Value Ref Range    Sodium 133 (L) 135 - 145 mmol/L    Potassium 5.0 3.5 - 5.1 mmol/L    Chloride 102 98 - 111 mmol/L    CO2 22 22 - 32 mmol/L    Glucose, Bld 147 (H) 70 - 99 mg/dL    BUN 62 (H) 8 - 23 mg/dL    Creatinine, Ser 8.52  (H) 0.61 - 1.24 mg/dL    Calcium 9.2 8.9 - 89.6 mg/dL    GFR, Estimated 52 (L) >60 mL/min    Anion gap 9 5 - 15  Magnesium     Status: None    Collection Time: 05/13/24  2:44 AM  Result Value Ref Range    Magnesium 2.4 1.7 - 2.4 mg/dL      Imaging Results (Last 48 hours)  No results found.     Assessment/Plan: Diagnosis: 50 male with metastatic lung cancer to the brain s/p right insular excisional bx.  Does the need for close, 24 hr/day medical supervision in concert with the patient's rehab needs make it unreasonable for this patient to be served in a less intensive setting? Yes Co-Morbidities requiring supervision/potential complications:  -oncology planning -pain mgt -AKI -nutrition Due to bladder management, bowel management, safety, skin/wound care, disease management, medication administration, pain management, and patient education, does the patient require 24 hr/day rehab nursing? Yes Does the patient require coordinated care of a physician, rehab nurse, therapy disciplines of PT, OT, SLP to address physical and functional deficits in the context of the above medical diagnosis(es)? Yes Addressing deficits in the following areas: balance, endurance, locomotion, strength, transferring, bowel/bladder control, bathing, dressing, feeding, grooming, toileting, cognition, language, and psychosocial support Can the patient actively participate in an intensive therapy program of at least 3 hrs of therapy per day at least 5 days per week? Yes The potential for patient to make measurable gains while on inpatient rehab is excellent Anticipated functional outcomes upon discharge from inpatient rehab are supervision  with PT, supervision with OT, supervision with SLP. Estimated rehab length of stay to reach the above functional goals is: 10-15 days Anticipated discharge destination: Home Overall Rehab/Functional Prognosis: good   POST ACUTE RECOMMENDATIONS: This patient's condition is  appropriate for continued rehabilitative care in the following setting: CIR Patient has agreed to participate in recommended program. N/A Note that insurance prior authorization may be required for reimbursement for recommended care.   Comment: Rehab Admissions Coordinator to follow up.         I have personally performed a face to face diagnostic evaluation of this patient. Additionally, I have examined the patient's medical record including any pertinent labs and radiographic images.     Thanks,   Arthea ONEIDA Gunther, MD 05/13/2024          Routing History

## 2024-05-16 NOTE — Progress Notes (Signed)
 Occupational Therapy Assessment and Plan  Patient Details  Name: Roger Black MRN: [id number] Date of Birth: 1955-09-18  OT Diagnosis: acute pain, muscle weakness (generalized), and pain in joint Rehab Potential: Rehab Potential (ACUTE ONLY): Good ELOS: 10-14 days   Today's Date: 05/16/2024 OT Individual Time: 9264-9149 OT Individual Time Calculation (min): 75 min     Hospital Problem: Principal Problem:   Metastatic cancer to brain [facility])   Past Medical History:  Past Medical History:  Diagnosis Date   Actinic keratosis    Aortic atherosclerosis    Arthritis    Asthma    Cholelithiasis    Chronic kidney disease, stage 3a (HCC)    Chronic systolic CHF (congestive heart failure) (HCC)    COPD (chronic obstructive pulmonary disease) (HCC)    Coronary artery disease    Emphysema lung (HCC)    HTN (hypertension)    Hyperlipidemia    Nephrolithiasis    Personal history of tobacco use, presenting hazards to health 09/21/2015   Sleep apnea    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   right lateral hand, treating with 5FU/calcipotriene   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   vertex scalp, treating with 5FU/calcipotriene   Past Surgical History:  Past Surgical History:  Procedure Laterality Date   APPLICATION OF CRANIAL NAVIGATION Right 05/08/2024   Procedure: COMPUTER-ASSISTED NAVIGATION, FOR CRANIAL PROCEDURE;  Surgeon: [name] Dino HERO, MD;  Location: [facility] OR;  Service: Neurosurgery;  Laterality: Right;   COLON SURGERY     COLONOSCOPY WITH PROPOFOL  N/A 10/20/2020   Procedure: COLONOSCOPY WITH PROPOFOL ;  Surgeon: Toledo, Ladell POUR, MD;  Location: [facility] ENDOSCOPY;  Service: Gastroenterology;  Laterality: N/A;   CRANIOTOMY Right 05/08/2024   Procedure: CRANIOTOMY TUMOR EXCISION;  Surgeon: [name] Dino HERO, MD;  Location: [facility] OR;  Service: Neurosurgery;  Laterality: Right;    Assessment & Plan Clinical Impression:  Roger Black is a 68 year  old male with history of HTN, CHF, COPD, CKD,  OSA,gout, Left upper lobe bronchogenic cancer s/p XRT, left shoulder pain (going to outpatient Tx) and onset of LLE pain with work MRI lumbar spine revealing multilevel stenosis with enhancement of nerve roots progressing to inability to walk. He was seen in ED on 10/06 and further imaging done showing abnormal linear enhancement along surface of thoracic spine rasing concern for leptomeningeal metastatic disease or inflammatory v/s infectious process as well as increased moderate stenosis and severe R>L neural foraminal stenosis C7-T1, unchanged moderate to severe bilateral neural foraminal stenosis C5/C6. Brain MRI done revealing enhancing lesion in right frontal lobe and left cerebellar hemisphere concerning for metastatic disease with associated vasogenic edema. Dr.[name] consulted and patient discussed with Dr. Melanee and Dr. [name] with recommendations of biopsy v/s resection. Palliative care consulted  and patient elected full scope of care. He underwent right craniotomy for resection of brain tumor on 10/16 and post op completed 7 day course of Keppra for seizure prophylaxis. Has had some confusion at night and pulled out JP drain.    NM bone scan negative for metastatic disease. Started on Lyrica for back and leg pain as well as baclofen for muscle spasms. He has elected on DNR with plans to persue treatment. He started 2 weeks of palliative rdiotherapy today with 3 fractions of SRS XRT today Patient transferred to CIR on 05/15/2024 .    Patient currently requires mod with basic self-care skills secondary to muscle weakness, decreased cardiorespiratoy endurance, and decreased standing balance, decreased postural control,  and decreased balance strategies.  Prior to hospitalization, patient could complete ADLs with independent .  Patient will benefit from skilled intervention to increase independence with basic self-care skills prior to discharge home with  care partner.  Anticipate patient will require intermittent supervision and follow up home health.  OT - End of Session Activity Tolerance: Tolerates < 10 min activity, no significant change in vital signs Endurance Deficit: Yes Endurance Deficit Description: generalized weakness OT Assessment Rehab Potential (ACUTE ONLY): Good OT Barriers to Discharge: Decreased caregiver support OT Barriers to Discharge Comments: wife cannot assist OT Patient demonstrates impairments in the following area(s): Balance;Sensory;Safety;Endurance;Motor;Pain;Vision OT Basic ADL's Functional Problem(s): Dressing;Bathing;Toileting OT Advanced ADL's Functional Problem(s): None OT Transfers Functional Problem(s): Toilet;Tub/Shower OT Additional Impairment(s): None OT Plan OT Intensity: Minimum of 1-2 x/day, 45 to 90 minutes OT Frequency: 5 out of 7 days OT Duration/Estimated Length of Stay: 10-14 days OT Treatment/Interventions: Balance/vestibular training;Self Care/advanced ADL retraining;Therapeutic Exercise;Wheelchair propulsion/positioning;UE/LE Strength taining/ROM;Pain management;DME/adaptive equipment instruction;Community reintegration;Patient/family education;UE/LE Coordination activities;Discharge planning;Functional mobility training;Psychosocial support;Therapeutic Activities;Visual/perceptual remediation/compensation OT Self Feeding Anticipated Outcome(s): no goal OT Basic Self-Care Anticipated Outcome(s): (S) OT Toileting Anticipated Outcome(s): (S) OT Bathroom Transfers Anticipated Outcome(s): (S) OT Recommendation Recommendations for Other Services: Neuropsych consult Patient destination: Home Follow Up Recommendations: Home health OT Equipment Recommended: To be determined   OT Evaluation Precautions/Restrictions  Precautions Precautions: Fall Restrictions Weight Bearing Restrictions Per Provider Order: No Home Living/Prior Functioning Home Living Family/patient expects to be  discharged to:: Private residence Living Arrangements: Spouse/significant other Available Help at Discharge: Family, Available 24 hours/day Type of Home: House Home Access: Level entry Home Layout: One level Bathroom Shower/Tub: Engineer, manufacturing systems: Standard Bathroom Accessibility: Yes IADL History Homemaking Responsibilities: Yes Meal Prep Responsibility: Secondary Laundry Responsibility: No Cleaning Responsibility: No Bill Paying/Finance Responsibility: Primary Current License: Yes Mode of Transportation: Car Occupation: Full time employment Type of Occupation: Research scientist (medical) Leisure and Hobbies: Riding motorcycles Prior Function Level of Independence: Independent with basic ADLs, Independent with gait, Independent with transfers  Able to Take Stairs?: Yes Driving: Yes Vocation: Full time employment Vision Baseline Vision/History: 1 Wears glasses Ability to See in Adequate Light: 1 Impaired Patient Visual Report: Diplopia Vision Assessment?: Yes Eye Alignment: Within Functional Limits Ocular Range of Motion: Within Functional Limits Tracking/Visual Pursuits: Right eye does not track laterally Saccades: Additional eye shifts occurred during testing Convergence: Impaired (comment) Visual Fields: No apparent deficits Diplopia Assessment: Objects split side to side;Present in primary gaze;Present all the time/all directions (diplopia begins at midline and in L vision) Additional Comments: R eye does not track smoothly and requires extra time to converge Perception  Perception: Within Functional Limits Praxis Praxis: WFL Cognition Cognition Overall Cognitive Status: Within Functional Limits for tasks assessed Arousal/Alertness: Awake/alert Orientation Level: Person;Place;Situation Person: Oriented Place: Oriented Situation: Oriented Memory: Appears intact Awareness: Appears intact Problem Solving: Appears intact Safety/Judgment: Appears intact Brief  Interview for Mental Status (BIMS) Repetition of Three Words (First Attempt): 3 Temporal Orientation: Year: Correct Temporal Orientation: Month: Accurate within 5 days Temporal Orientation: Day: Correct Recall: Sock: No, could not recall Recall: Blue: Yes, no cue required Recall: Bed: Yes, no cue required BIMS Summary Score: 13 Sensation Sensation Light Touch: Impaired Detail Central sensation comments: BLE burning and tingling down the back of his legs Light Touch Impaired Details: Impaired RLE;Impaired LLE Coordination Gross Motor Movements are Fluid and Coordinated: No Fine Motor Movements are Fluid and Coordinated: Yes Coordination and Movement Description: generalized weakness, pain limiting Finger Nose Finger Test: WNL Motor  Motor Motor: Other (comment) Motor - Skilled Clinical Observations: generalized weakness  Trunk/Postural Assessment  Cervical Assessment Cervical Assessment: Within Functional Limits Thoracic Assessment Thoracic Assessment: Within Functional Limits Lumbar Assessment Lumbar Assessment: Within Functional Limits Postural Control Postural Control: Deficits on evaluation Righting Reactions: delayed  Balance Balance Balance Assessed: Yes Static Sitting Balance Static Sitting - Balance Support: Bilateral upper extremity supported Static Sitting - Level of Assistance: 5: Stand by assistance Dynamic Sitting Balance Dynamic Sitting - Balance Support: Bilateral upper extremity supported Dynamic Sitting - Level of Assistance: 5: Stand by assistance Static Standing Balance Static Standing - Balance Support: Right upper extremity supported Static Standing - Level of Assistance: 3: Mod assist Dynamic Standing Balance Dynamic Standing - Balance Support: Right upper extremity supported Dynamic Standing - Level of Assistance: 3: Mod assist;2: Max assist Extremity/Trunk Assessment RUE Assessment RUE Assessment: Within Functional Limits LUE  Assessment LUE Assessment: Within Functional Limits  Care Tool Care Tool Self Care Eating   Eating Assist Level: Supervision/Verbal cueing    Oral Care    Oral Care Assist Level: Supervision/Verbal cueing    Bathing   Body parts bathed by patient: Right arm;Left arm;Abdomen;Chest;Front perineal area;Right upper leg;Left upper leg;Right lower leg;Left lower leg;Face Body parts bathed by helper: Buttocks   Assist Level: Moderate Assistance - Patient 50 - 74%    Upper Body Dressing(including orthotics)   What is the patient wearing?: Pull over shirt   Assist Level: Minimal Assistance - Patient > 75%    Lower Body Dressing (excluding footwear)   What is the patient wearing?: Underwear/pull up;Pants Assist for lower body dressing: Moderate Assistance - Patient 50 - 74%    Putting on/Taking off footwear   What is the patient wearing?: Non-skid slipper socks Assist for footwear: Dependent - Patient 0%       Care Tool Toileting Toileting activity   Assist for toileting: Moderate Assistance - Patient 50 - 74%     Care Tool Bed Mobility Roll left and right activity   Roll left and right assist level: Moderate Assistance - Patient 50 - 74%    Sit to lying activity   Sit to lying assist level: Moderate Assistance - Patient 50 - 74%    Lying to sitting on side of bed activity   Lying to sitting on side of bed assist level: the ability to move from lying on the back to sitting on the side of the bed with no back support.: Moderate Assistance - Patient 50 - 74%     Care Tool Transfers Sit to stand transfer   Sit to stand assist level: Moderate Assistance - Patient 50 - 74%    Chair/bed transfer   Chair/bed transfer assist level: Moderate Assistance - Patient 50 - 74%     Toilet transfer   Assist Level: Moderate Assistance - Patient 50 - 74%     Care Tool Cognition  Expression of Ideas and Wants Expression of Ideas and Wants: 4. Without difficulty (complex and basic) -  expresses complex messages without difficulty and with speech that is clear and easy to understand  Understanding Verbal and Non-Verbal Content Understanding Verbal and Non-Verbal Content: 4. Understands (complex and basic) - clear comprehension without cues or repetitions   Memory/Recall Ability Memory/Recall Ability : Current season;Location of own room;Staff names and faces;That he or she is in a hospital/hospital unit   Refer to Care Plan for Long Term Goals  SHORT TERM GOAL WEEK 1 OT Short Term Goal 1 (Week 1): Pt  will use LRAD and don pants with min A OT Short Term Goal 2 (Week 1): Pt will complete stand pivot to the [facility] with min A OT Short Term Goal 3 (Week 1): Pt will complete toileting tasks with min A OT Short Term Goal 4 (Week 1): Pt will tolerate sitting up in the w/c for 2 hours to demo improved activity tolerance  Recommendations for other services: Neuropsych   Skilled Therapeutic Intervention ADL ADL Eating: Supervision/safety Where Assessed-Eating: Chair Grooming: Supervision/safety Where Assessed-Grooming: Sitting at sink Upper Body Bathing: Supervision/safety Where Assessed-Upper Body Bathing: Sitting at sink Lower Body Bathing: Moderate assistance Where Assessed-Lower Body Bathing: Sitting at sink;Standing at sink Upper Body Dressing: Minimal assistance Where Assessed-Upper Body Dressing: Sitting at sink Lower Body Dressing: Moderate assistance Where Assessed-Lower Body Dressing: Sitting at sink;Standing at sink Toileting: Moderate assistance Where Assessed-Toileting: Toilet;Bedside Commode Toilet Transfer: Moderate assistance Toilet Transfer Method: Stand pivot Tub/Shower Transfer: Unable to assess Mobility  Bed Mobility Bed Mobility: Sit to Supine;Supine to Sit Supine to Sit: Moderate Assistance - Patient 50-74% Sit to Supine: Moderate Assistance - Patient 50-74% Transfers Sit to Stand: Moderate Assistance - Patient 50-74% Stand to Sit: Moderate  Assistance - Patient 50-74%   Skilled OT evaluation completed with the creation of pt centered OT POC. Pt educated on condition, ELOS, rehab expectations, and fall risk reduction strategies throughout session. Pt completed ADLs as described above. He was very limited by pain in his back and back of his legs. He completed transfers at min-mod A. Pt was left sitting up in the wheelchair with all needs met, chair alarm set, and call bell within reach.   Discharge Criteria: Patient will be discharged from OT if patient refuses treatment 3 consecutive times without medical reason, if treatment goals not met, if there is a change in medical status, if patient makes no progress towards goals or if patient is discharged from hospital.  The above assessment, treatment plan, treatment alternatives and goals were discussed and mutually agreed upon: by patient  [name] 05/16/2024, 9:43 AM

## 2024-05-16 NOTE — Evaluation (Signed)
 Speech Language Pathology Assessment and Plan  Patient Details  Name: Roger Black MRN: [id number] Date of Birth: Jun 14, 1956  SLP Diagnosis: Cognitive Impairments  Rehab Potential: Poor ELOS: 10-14 days   Today's Date: 05/16/2024 SLP Individual Time: 0930-1030 SLP Individual Time Calculation (min): 60 min   Hospital Problem: Principal Problem:   Metastatic cancer to brain [facility])  Past Medical History:  Past Medical History:  Diagnosis Date   Actinic keratosis    Aortic atherosclerosis    Arthritis    Asthma    Cholelithiasis    Chronic kidney disease, stage 3a (HCC)    Chronic systolic CHF (congestive heart failure) (HCC)    COPD (chronic obstructive pulmonary disease) (HCC)    Coronary artery disease    Emphysema lung (HCC)    HTN (hypertension)    Hyperlipidemia    Nephrolithiasis    Personal history of tobacco use, presenting hazards to health 09/21/2015   Sleep apnea    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   right lateral hand, treating with 5FU/calcipotriene   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   vertex scalp, treating with 5FU/calcipotriene   Past Surgical History:  Past Surgical History:  Procedure Laterality Date   APPLICATION OF CRANIAL NAVIGATION Right 05/08/2024   Procedure: COMPUTER-ASSISTED NAVIGATION, FOR CRANIAL PROCEDURE;  Surgeon: [name] Dino HERO, MD;  Location: [facility] OR;  Service: Neurosurgery;  Laterality: Right;   COLON SURGERY     COLONOSCOPY WITH PROPOFOL  N/A 10/20/2020   Procedure: COLONOSCOPY WITH PROPOFOL ;  Surgeon: Toledo, Ladell POUR, MD;  Location: [facility] ENDOSCOPY;  Service: Gastroenterology;  Laterality: N/A;   CRANIOTOMY Right 05/08/2024   Procedure: CRANIOTOMY TUMOR EXCISION;  Surgeon: [name] Dino HERO, MD;  Location: [facility] OR;  Service: Neurosurgery;  Laterality: Right;    Assessment / Plan / Recommendation Clinical Impression Pt is a 68 year old male with history of HTN, CHF, COPD, CKD,  OSA,gout, Left  upper lobe bronchogenic cancer s/p XRT, left shoulder pain (going to outpatient Tx) and onset of LLE pain with work MRI lumbar spine revealing multilevel stenosis with enhancement of nerve roots progressing to inability to walk. He was seen in ED on 10/06 and further imaging done showing abnormal linear enhancement along surface of thoracic spine rasing concern for leptomeningeal metastatic disease or inflammatory v/s infectious process as well as increased moderate stenosis and severe R>L neural foraminal stenosis C7-T1, unchanged moderate to severe bilateral neural foraminal stenosis C5/C6. Brain MRI done revealing enhancing lesion in right frontal lobe and left cerebellar hemisphere concerning for metastatic disease with associated vasogenic edema. Dr.[name] consulted and patient discussed with Dr. Melanee and Dr. [name] with recommendations of biopsy v/s resection. Palliative care consulted  and patient elected full scope of care. He underwent right craniotomy for resection of brain tumor on 10/16 and post op completed 7 day course of Keppra for seizure prophylaxis. Has had some confusion at night and pulled out JP drain. NM bone scan negative for metastatic disease. Started on Lyrica for back and leg pain as well as baclofen for muscle spasms. He has elected on DNR with plans to persue treatment. He started 2 weeks of palliative radiotherapy today with 3 fractions of SRS XRT today.  Pt presented w/ mild to moderate cognitive deficits overall. Moderate STM deficits noted w/ complex information only. He demonstrated adequate verbal problem solving during initial phone call w/ insurance company re a bill/claim and during med pass w/ his meds. He asked appropriate questions re upcoming radiation  schedule and POC. Only able to complete portions of the SLUMS d/t verbose speech and med pass. STM and information processing deficits noted during portions completed. No concern re expressive/receptive language and speech  was intelligible throughout. Did set a goal for problem solving to ensure adequate success during functional tasks as well. Recommend cont ST to target mild cognitive deficits, maximize pt independence, and reduce caregiver burden.      Skilled Therapeutic Interventions          SLP facilitated a cognitive-linguistic evaluation to assess pt's cognitive-communication skills and determine need for additional skilled ST services. See above for more information.    SLP Assessment  Patient will need skilled Speech Lanaguage Pathology Services during CIR admission    Recommendations  SLP Diet Recommendations: Age appropriate regular solids;Thin Liquid Administration via: Straw Medication Administration: Whole meds with liquid Supervision: Patient able to self feed Patient destination: Home Follow up Recommendations: Home Health SLP Equipment Recommended: None recommended by SLP    SLP Frequency 1 to 3 out of 7 days   SLP Duration  SLP Intensity  SLP Treatment/Interventions 10-14 days  Minumum of 1-2 x/day, 30 to 90 minutes  Cognitive remediation/compensation;Cueing hierarchy;Functional tasks;Therapeutic Activities;Therapeutic Exercise;Patient/family education;Internal/external aids    Pain  No pain reported  Prior Functioning Type of Home: House Available Help at Discharge: Family;Available 24 hours/day Vocation: Full time employment  SLP Evaluation Cognition Overall Cognitive Status: Impaired/Different from baseline Arousal/Alertness: Awake/alert Orientation Level: Oriented X4 Year: 2025 Month: October Day of Week: Correct Attention: Selective Selective Attention: Appears intact Memory: Impaired Memory Impairment: Retrieval deficit;Decreased short term memory Decreased Short Term Memory: Verbal complex;Functional complex Awareness: Appears intact Problem Solving: Impaired Problem Solving Impairment: Functional complex Safety/Judgment: Appears intact   Comprehension Auditory Comprehension Overall Auditory Comprehension: Appears within functional limits for tasks assessed Expression Expression Primary Mode of Expression: Verbal Verbal Expression Overall Verbal Expression: Appears within functional limits for tasks assessed Written Expression Dominant Hand: Left Oral Motor Oral Motor/Sensory Function Overall Oral Motor/Sensory Function: Within functional limits Motor Speech Overall Motor Speech: Appears within functional limits for tasks assessed  Care Tool Care Tool Cognition Ability to hear (with hearing aid or hearing appliances if normally used Ability to hear (with hearing aid or hearing appliances if normally used): 0. Adequate - no difficulty in normal conservation, social interaction, listening to TV   Expression of Ideas and Wants Expression of Ideas and Wants: 4. Without difficulty (complex and basic) - expresses complex messages without difficulty and with speech that is clear and easy to understand   Understanding Verbal and Non-Verbal Content Understanding Verbal and Non-Verbal Content: 4. Understands (complex and basic) - clear comprehension without cues or repetitions  Memory/Recall Ability Memory/Recall Ability : Current season;Location of own room;Staff names and faces;That he or she is in a hospital/hospital unit   Motor Speech Assessment  WFL   Short Term Goals: Week 1: SLP Short Term Goal 1 (Week 1): Pt will recall recent/relevant information w/ minA SLP Short Term Goal 2 (Week 1): Pt will solve mildly complex problems w/ minA  Refer to Care Plan for Long Term Goals  Recommendations for other services: Neuropsych and Therapeutic Recreation  Pet therapy  Discharge Criteria: Patient will be discharged from SLP if patient refuses treatment 3 consecutive times without medical reason, if treatment goals not met, if there is a change in medical status, if patient makes no progress towards goals or if patient is  discharged from hospital.  The above assessment, treatment plan, treatment  alternatives and goals were discussed and mutually agreed upon: by patient  [name] 05/16/2024, 4:12 PM

## 2024-05-16 NOTE — H&P (Signed)
 Physical Medicine and Rehabilitation Admission H&P     CC: Functional deficits due to mets to brain.      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 He was seen in ED on 10/06 and further imaging done showing abnormal linear enhancement along surface of thoracic spine rasing concern for leptomeningeal metastatic disease or inflammatory v/s infectious process as well as increased moderate stenosis and severe R>L neural foraminal stenosis C7-T1, unchanged moderate to severe bilateral neural foraminal stenosis C5/C6. Brain MRI done revealing enhancing lesion in right frontal lobe and left cerebellar hemisphere concerning for metastatic disease with associated vasogenic edema. Dr.[name] consulted and patient discussed with Dr. Melanee and Dr. [name] with recommendations of biopsy v/s resection. Palliative care consulted  and patient elected full scope of care. He underwent right craniotomy for resection of brain tumor on 10/16 and post op completed 7 day course of Keppra for seizure prophylaxis. Has had some confusion at night and pulled out JP drain.    NM bone scan negative for metastatic disease. Started on Lyrica for back and leg pain as well as baclofen for muscle spasms. He has elected on DNR with plans to persue treatment. He started 2 weeks of palliative rdiotherapy today with 3 fractions of SRS XRT today   He works as Merchandiser, retail.    Review of Systems  Eyes:  Positive for double vision.  Cardiovascular:  Negative for chest pain.  Musculoskeletal:  Positive for back pain (radiating to BLE) and myalgias.       Tremors BUE.   Neurological:  Positive for sensory change (cramps a nd tingling BUE), focal  weakness and headaches.           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    vertex scalp, treating with 5FU/calcipotriene               Past Surgical History:  Procedure Laterality Date   APPLICATION OF CRANIAL NAVIGATION Right 05/08/2024    Procedure: COMPUTER-ASSISTED NAVIGATION, FOR CRANIAL PROCEDURE;  Surgeon: [name] Dino HERO, MD;  Location: [facility] OR;  Service: Neurosurgery;  Laterality: Right;   COLON SURGERY       COLONOSCOPY WITH PROPOFOL  N/A 10/20/2020    Procedure: COLONOSCOPY WITH PROPOFOL ;  Surgeon: Toledo, Ladell POUR, MD;  Location: [facility] ENDOSCOPY;  Service: Gastroenterology;  Laterality: N/A;   CRANIOTOMY Right 05/08/2024    Procedure: CRANIOTOMY TUMOR EXCISION;  Surgeon: [name] Dino HERO, MD;  Location: [facility];  Service: Neurosurgery;  Laterality: Right;  Family History  Problem Relation Age of Onset   Hypertension Mother     Hypertension Father            Social History: Married. Independent PTA. He  reports that he has quit smoking. His smoking use included cigarettes. He has a 47 pack-year smoking history. He has never used smokeless tobacco. He reports that he does not drink alcohol  and does not use drugs.     Allergies:  Allergies  No Known Allergies             Medications Prior to Admission  Medication Sig Dispense Refill   acetaminophen (TYLENOL) 325 MG tablet Take 2 tablets (650 mg  total) by mouth every 6 (six) hours as needed for mild pain (pain score 1-3) or fever.       albuterol (VENTOLIN HFA) 108 (90 Base) MCG/ACT inhaler Inhale 2 puffs into the lungs every 4 (four) hours as needed for shortness of breath.       aspirin 81 MG EC tablet Take 81 mg by mouth daily.       atorvastatin (LIPITOR) 80 MG tablet Take 80 mg by mouth daily.       budesonide-formoterol (SYMBICORT) 160-4.5 MCG/ACT inhaler Inhale 2 puffs into the lungs 2 (two) times daily.       doxazosin (CARDURA) 2 MG tablet Take 2 mg by mouth at bedtime.       DULoxetine (CYMBALTA) 60 MG capsule Take 1 capsule (60 mg total) by mouth daily.       HYDROcodone-acetaminophen (NORCO/VICODIN) 5-325 MG tablet Take 1 tablet by mouth 2 (two) times daily as needed for moderate pain (pain score 4-6).       methocarbamol (ROBAXIN) 500 MG tablet Take 1 tablet (500 mg total) by mouth 4 (four) times daily.       montelukast (SINGULAIR) 10 MG tablet Take 10 mg by mouth daily.       olmesartan (BENICAR) 40 MG tablet Take 40 mg by mouth daily.       ondansetron (ZOFRAN) 4 MG/2ML SOLN injection Inject 2 mLs (4 mg total) into the vein every 8 (eight) hours as needed for nausea or vomiting.       oxyCODONE-acetaminophen (PERCOCET/ROXICET) 5-325 MG tablet Take 1 tablet by mouth every 6 (six) hours.       pantoprazole (PROTONIX) 40 MG tablet Take 40 mg by mouth daily.       pregabalin (LYRICA) 150 MG capsule Take 1 capsule (150 mg total) by mouth 3 (three) times daily.       [Paused] spironolactone (ALDACTONE) 50 MG tablet Take 50 mg by mouth daily.       [Paused] torsemide (DEMADEX) 100 MG tablet Take 100 mg by mouth daily as needed (edema).       zolpidem (AMBIEN) 5 MG tablet Take 1 tablet (5 mg total) by mouth at bedtime as needed for sleep.       ipratropium (ATROVENT) 0.03 % nasal spray Place into the nose.       Morphine Sulfate (MORPHINE, PF,) 2 MG/ML injection Inject 1 mL (2 mg total) into the vein every 4 (four) hours as needed  (breakthrough pain). (Patient not taking: Reported on 05/07/2024)              Home: Home Living Family/patient expects to be discharged to:: Private residence Living Arrangements: Spouse/significant other Available Help at Discharge: Family, Available 24 hours/day Type of Home: House Home Access: Level entry Home Layout: One level Bathroom  Shower/Tub: Engineer, manufacturing systems: Standard Home Equipment: [employer] - single point   Functional History: Prior Function Prior Level of Function : Independent/Modified Independent, Working/employed Mobility Comments: indep with no AD ADLs Comments: MOD I-I with ADL/IADL, works at [employer] Status:  Mobility: Bed Mobility Overal bed mobility: Needs Assistance Bed Mobility: Rolling, Sidelying to Sit, Sit to Sidelying Rolling: Supervision Sidelying to sit: Min assist, +2 for safety/equipment Supine to sit: Min assist, +2 for safety/equipment Sit to supine: HOB elevated, Min assist General bed mobility comments: increased time and cueing for technique, minA for trunk elevation Transfers Overall transfer level: Needs assistance Equipment used: Rolling walker (2 wheels) Transfers: Sit to/from Stand Sit to Stand: Min assist, +2 physical assistance Step pivot transfers: Mod assist, +2 safety/equipment General transfer comment: minA to power up, pt with wide base of support, required significant time and 2 trials prior to standing, modA for step pvt from recliner to [facility] via HHA, max directional verbal cues Ambulation/Gait Ambulation/Gait assistance: Min assist, +2 safety/equipment Gait Distance (Feet): 30 Feet Assistive device: Rolling walker (2 wheels) Gait Pattern/deviations: Decreased stride length, Wide base of support, Staggering right, Staggering left General Gait Details: pt with noted lateral sway L/R and ant/posterior, pt became more confused/delayed response however VSS, pt stating Im just trying to  figure out what I'm doing, minA for walker management and 2nd person for chair follow and line management Gait velocity: decreased Gait velocity interpretation: <1.31 ft/sec, indicative of household ambulator   ADL: ADL Overall ADL's : Needs assistance/impaired Grooming: Set up, Sitting Upper Body Dressing : Contact guard assist, Sitting Lower Body Dressing: Moderate assistance, Sit to/from stand Toilet Transfer: Minimal assistance, +2 for physical assistance Toilet Transfer Details (indicate cue type and reason): bil hand held support at EOB Functional mobility during ADLs: Minimal assistance, +2 for physical assistance   Cognition: Cognition Orientation Level: Oriented to person, Oriented to place, Oriented to situation, Disoriented to time Cognition Arousal: Alert Behavior During Therapy: Lability     Blood pressure 132/71, pulse 87, temperature 98.7 F (37.1 C), temperature source Bladder, resp. rate 13, height 5' 9 (1.753 m), SpO2 (!) 80%. Physical Exam Constitutional: No apparent distress. Appropriate appearance for age.  HENT: No JVD. Neck Supple. Trachea midline. Atraumatic, normocephalic. Eyes: PERRLA. EOMI. Visual fields grossly intact.  + Difficulty with convergence, especially tracking to the right  Cardiovascular: RRR, no murmurs/rub/gallops. No Edema. Peripheral pulses 2+  Respiratory: CTAB. No rales, rhonchi, or wheezing. On 1 L [place].  Abdomen: + bowel sounds, normoactive. + distention , no tenderness.  Skin: C/D/I. No apparent lesions. + Craniotomy c/d/I + PIV c/d/i   MSK:      No apparent deformity.       Neurologic exam:  Cognition: AAO to person, place, time and event.  + Impulsive, poor awareness   Language: Fluent, No substitutions or neoglisms. No dysarthria. Names 3/3 objects correctly.  Memory: Recalls 2/3 objects at 5 minutes.  Mild deficits.  Insight: Fair insight into current condition.  Mood: Pleasant affect, appropriate mood.  Sensation:  To light touch reduced on RLE lateral malleoli; sensitivity in BL LE to light touch Reflexes: 2+ in BL UE and 3+BL LEs. Negative Hoffman's and babinski signs bilaterally.   CN: L eye difficulty converging, otherwise intact Coordination: No apparent tremors. No ataxia on FTN Spasticity: MAS 0 in all extremities.       Strength:  RUE: 5/5 SA, 5/5 EF, 5/5 EE, 5/5 WE, 5/5 FF, 5/5 FA                LUE:  5/5 SA, 5/5 EF, 5/5 EE, 5/5 WE, 5/5 FF, 5/5 FA                RLE: 4/5 HF, 5/5 KE, 5/5  DF, 5/5  EHL, 5/5  PF                 LLE:  4/5 HF, 5/5 KE, 5/5  DF, 5/5  EHL, 5/5  PF          Lab Results Last 48 Hours        Results for orders placed or performed during the hospital encounter of 05/07/24 (from the past 48 hours)  CBC     Status: None    Collection Time: 05/13/24  2:44 AM  Result Value Ref Range    WBC 6.7 4.0 - 10.5 K/uL    RBC 4.32 4.22 - 5.81 MIL/uL    Hemoglobin 14.0 13.0 - 17.0 g/dL    HCT 57.8 60.9 - 47.9 %    MCV 97.5 80.0 - 100.0 fL    MCH 32.4 26.0 - 34.0 pg    MCHC 33.3 30.0 - 36.0 g/dL    RDW 87.6 88.4 - 84.4 %    Platelets 187 150 - 400 K/uL    nRBC 0.0 0.0 - 0.2 %      Comment: Performed at [facility], [address] [place], [name] [address]  Basic metabolic panel     Status: Abnormal    Collection Time: 05/13/24  2:44 AM  Result Value Ref Range    Sodium 133 (L) 135 - 145 mmol/L    Potassium 5.0 3.5 - 5.1 mmol/L    Chloride 102 98 - 111 mmol/L    CO2 22 22 - 32 mmol/L    Glucose, Bld 147 (H) 70 - 99 mg/dL      Comment: Glucose reference range applies only to samples taken after fasting for at least 8 hours.    BUN 62 (H) 8 - 23 mg/dL    Creatinine, Ser 8.52 (H) 0.61 - 1.24 mg/dL    Calcium 9.2 8.9 - 89.6 mg/dL    GFR, Estimated 52 (L) >60 mL/min      Comment: (NOTE) Calculated using the CKD-EPI Creatinine Equation (2021)      Anion gap 9 5 - 15      Comment: Performed at [facility], [address] [place], [name]  [address]  Magnesium     Status: None    Collection Time: 05/13/24  2:44 AM  Result Value Ref Range    Magnesium 2.4 1.7 - 2.4 mg/dL      Comment: Performed at [facility], [address] [place], [name] [address]  Basic metabolic panel     Status: Abnormal    Collection Time: 05/14/24  2:47 AM  Result Value Ref Range    Sodium 131 (L) 135 - 145 mmol/L    Potassium 5.0 3.5 - 5.1 mmol/L    Chloride 101 98 - 111 mmol/L    CO2 21 (L) 22 - 32 mmol/L    Glucose, Bld 117 (H) 70 - 99 mg/dL      Comment: Glucose reference range applies only to samples taken after fasting for at least 8 hours.    BUN 61 (H) 8 -  23 mg/dL    Creatinine, Ser 8.61 (H) 0.61 - 1.24 mg/dL    Calcium 9.2 8.9 - 89.6 mg/dL    GFR, Estimated 56 (L) >60 mL/min      Comment: (NOTE) Calculated using the CKD-EPI Creatinine Equation (2021)      Anion gap 9 5 - 15      Comment: Performed at [name], [address], [place], [name] [address]  CBC     Status: None    Collection Time: 05/14/24  2:47 AM  Result Value Ref Range    WBC 6.9 4.0 - 10.5 K/uL    RBC 4.32 4.22 - 5.81 MIL/uL    Hemoglobin 14.1 13.0 - 17.0 g/dL    HCT 57.6 60.9 - 47.9 %    MCV 97.9 80.0 - 100.0 fL    MCH 32.6 26.0 - 34.0 pg    MCHC 33.3 30.0 - 36.0 g/dL    RDW 87.7 88.4 - 84.4 %    Platelets 186 150 - 400 K/uL    nRBC 0.0 0.0 - 0.2 %      Comment: Performed at [facility], [address] [place], [name] [address]      Imaging Results (Last 48 hours)  No results found.         Blood pressure 132/71, pulse 87, temperature 98.7 F (37.1 C), temperature source Bladder, resp. rate 13, height 5' 9 (1.753 m), SpO2 (!) 80%.   Medical Problem List and Plan: 1. 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  Antithrombotics: -DVT/anticoagulation:  Pharmaceutical: Lovenox             -antiplatelet  therapy: N/A 3. Pain Management: percocet prn.  4. Mood/Behavior/Sleep: LCSW to follow for evaluation and support             --continue ambien prn.              -antipsychotic agents: N/A 5. Neuropsych/cognition: This patient may be intermittently  capable of making decisions on his own behalf. 6. Skin/Wound Care: Routine pressure relief measures. 7. Fluids/Electrolytes/Nutrition: Monitor I/O.   8. Lung cancer w/brain mets: XRT started today and at end of day as able to help with tolerance of therapy             --on  Decadron 4 mg TID and fasting BS 120-150 range.  --Will check A1c in am.  9. COPD: On Singulair with albuterol prn.  10. HTN: Monitor BP TID---on avapro and cardura.  11. CAD: On Avapro and statin. Monitor for symptoms with increase in activity.              --continue to hold spironolactone and torsemide.   12.  Hyponatremia: Na 132 @ admission-->131.  --Recheck BMET in am 13. Acute on chronic renal failure: Encourage fluid intake.  BUN/SCr 27/1.32-->61/1.38 -- Monitor potassium levels as baseline 4.9-5.0-->continue ARB for now.  - Recheck CMET in am.      Sharlet GORMAN Schmitz, PA-C 05/14/2024  I have examined the patient independently and edited the note for HPI, ROS, exam, assessment, and plan as appropriate. I am in agreement with the above recommendations.   Joesph JAYSON Likes, DO 05/16/2024

## 2024-05-16 NOTE — Progress Notes (Signed)
 Inpatient Rehabilitation Care Coordinator Assessment and Plan Patient Details  Name: Roger Black MRN: [id number] Date of Birth: 01-24-1956  Today's Date: 05/16/2024  Hospital Problems: Principal Problem:   Metastatic cancer to brain [facility])  Past Medical History:  Past Medical History:  Diagnosis Date   Actinic keratosis    Aortic atherosclerosis    Arthritis    Asthma    Cholelithiasis    Chronic kidney disease, stage 3a (HCC)    Chronic systolic CHF (congestive heart failure) (HCC)    COPD (chronic obstructive pulmonary disease) (HCC)    Coronary artery disease    Emphysema lung (HCC)    HTN (hypertension)    Hyperlipidemia    Nephrolithiasis    Personal history of tobacco use, presenting hazards to health 09/21/2015   Sleep apnea    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   right lateral hand, treating with 5FU/calcipotriene   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   vertex scalp, treating with 5FU/calcipotriene   Past Surgical History:  Past Surgical History:  Procedure Laterality Date   APPLICATION OF CRANIAL NAVIGATION Right 05/08/2024   Procedure: COMPUTER-ASSISTED NAVIGATION, FOR CRANIAL PROCEDURE;  Surgeon: [name] Dino HERO, MD;  Location: [facility] OR;  Service: Neurosurgery;  Laterality: Right;   COLON SURGERY     COLONOSCOPY WITH PROPOFOL  N/A 10/20/2020   Procedure: COLONOSCOPY WITH PROPOFOL ;  Surgeon: Toledo, Ladell POUR, MD;  Location: [facility] ENDOSCOPY;  Service: Gastroenterology;  Laterality: N/A;   CRANIOTOMY Right 05/08/2024   Procedure: CRANIOTOMY TUMOR EXCISION;  Surgeon: [name] Dino HERO, MD;  Location: [facility] OR;  Service: Neurosurgery;  Laterality: Right;   Social History:  reports that he has quit smoking. His smoking use included cigarettes. He has a 47 pack-year smoking history. He has never used smokeless tobacco. He reports that he does not drink alcohol  and does not use drugs.  Family / Support Systems Marital Status:  Married How Long?: 6 years; pt reports he has been married 3xs Patient Roles: Spouse, Parent Spouse/Significant Other: Wife-Roger Black Children: son- Roger Black; estranged Other Supports: none Anticipated Caregiver: wife Ability/Limitations of Caregiver: Pt reports his wife will be primary caregiver Caregiver Availability: 24/7 Family Dynamics: Pt lives with his wife.  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 He is not a Cytogeneticist. Education: 8th grade Health Literacy - How often do you need to have someone help you when you read instructions, pamphlets, or other written material from your doctor or pharmacy?: Rarely Writes: Yes Employment Status: Employed Name of Employer: [employer]  - [employer] of Employment:  (42) Return to Work Plans: TBD Marine scientist Issues: Denies Guardian/Conservator: N/A   Abuse/Neglect Abuse/Neglect Assessment Can Be Completed: Yes Physical Abuse: Denies Verbal Abuse: Denies Sexual Abuse: Denies Exploitation of patient/patient's resources: Denies Self-Neglect: Denies  Patient response to: Social Isolation - How often do you feel lonely or isolated from those around you?: Never  Emotional Status Pt's affect, behavior and adjustment status: Pt in good spirits at time of visit; uses humor to answer questions. Unsure on if there are cog def. Recent Psychosocial Issues: Denies Psychiatric History: Denies Substance Abuse History: Pt reports he quit smoking 05/21/23. Reports sometimes he would have a beer after work. Denies rec drug use  Patient / Family Perceptions, Expectations & Goals Pt/Family understanding of illness & functional limitations: Pt has a general understanding of care needs Premorbid pt/family roles/activities: Independent Anticipated  changes in roles/activities/participation: Assistance with  ADLs/IADLs Pt/family expectations/goals: Pt goal is to work on gettin back to being as independent as possible.  Community Resources [employer]: None Premorbid Home Care/DME Agencies: None Transportation available at discharge: wife Is the patient able to respond to transportation needs?: Yes In the past 12 months, has lack of transportation kept you from medical appointments or from getting medications?: No In the past 12 months, has lack of transportation kept you from meetings, work, or from getting things needed for daily living?: No Resource referrals recommended: Neuropsychology  Discharge Planning Living Arrangements: Spouse/significant other Support Systems: Spouse/significant other Type of Residence: Private residence Insurance Resources: Media planner (specify) ([employer]) Financial Resources: Employment Surveyor, quantity Screen Referred: No Living Expenses: Banker Management: Patient Does the patient have any problems obtaining your medications?: No Home Management: Pt reports his wife managed all home care needs Patient/Family Preliminary Plans: No changes Care Coordinator Barriers to Discharge: Decreased caregiver support, Lack of/limited family support, Insurance for SNF coverage, Other (comments) Care Coordinator Barriers to Discharge Comments: current radiation treatment Care Coordinator Anticipated Follow Up Needs: HH/OP  Clinical Impression SW met with pt at bedside to introduce self, explain role, and discuss discharge process. Pt is not a Cytogeneticist. Confirms wife is primary support. Access to only a cane.   1023- SW left message for pt wife introducing self, and requested return phone call to discuss support. SW also informed pt has been set up for transportation to radiation treatment while he is here.  Pt set up for radiation through 11/5.  11/3 pick up 2p 11/4 pick up at 245pm 11/5 pick up 230pm  Bridget Westbrooks A April Colter 05/16/2024, 11:19  AM

## 2024-05-16 NOTE — Plan of Care (Signed)
  Problem: RH Problem Solving Goal: LTG Patient will demonstrate problem solving for (SLP) Description: LTG:  Patient will demonstrate problem solving for basic/complex daily situations with cues  (SLP) Flowsheets (Taken 05/16/2024 1613) LTG: Patient will demonstrate problem solving for (SLP):  Basic daily situations  Complex daily situations LTG Patient will demonstrate problem solving for: Supervision Note: Mildly complex info   Problem: RH Memory Goal: LTG Patient will demonstrate ability for day to day (SLP) Description: LTG:   Patient will demonstrate ability for day to day recall/carryover during cognitive/linguistic activities with assist  (SLP) Flowsheets (Taken 05/16/2024 1613) LTG: Patient will demonstrate ability for day to day recall:  New information  Daily complex information LTG: Patient will demonstrate ability for day to day recall/carryover during cognitive/linguistic activities with assist (SLP): Supervision

## 2024-05-16 NOTE — Progress Notes (Signed)
 Inpatient Rehabilitation Admission Medication Review by a Pharmacist  A complete drug regimen review was completed for this patient to identify any potential clinically significant medication issues.  High Risk Drug Classes Is patient taking? Indication by Medication  Antipsychotic Yes Compazine-N/V  Anticoagulant Yes Enoxaparin-VTE px  Antibiotic No   Opioid Yes Percocet-Mod, severe pain  Antiplatelet No   Hypoglycemics/insulin No   Vasoactive Medication Yes Doxazosin, Irbesartan-HTN  Chemotherapy No   Other Yes Acetaminophen-Mild pain Albuterol, Breo, singulair-COPD Maalox, Pantoprazole--GERD Baclofen-Muscle spasms Bisacodyl, Miralax, Senna-S, fleets-constipation Atorvastatin-HLD Dexamethasone-cerebral edema, spinal stenosis Benadryl-itching Duloxetine-MDD, pain Guaifenesin/DM-cough Atorvent NS-allergies Lyrica-nerve pain Ambien-sleep     Type of Medication Issue Identified Description of Issue Recommendation(s)  Drug Interaction(s) (clinically significant)     Duplicate Therapy     Allergy     No Medication Administration End Date     Incorrect Dose     Additional Drug Therapy Needed  On Torsemide, Aldactone for CHF, and Aspirin for CAD PTA Please continue when appropriate  Significant med changes from prior encounter (inform family/care partners about these prior to discharge). Patient converted to breo for symbicort and Irbesartan for olmesartan based on formulary conversions Please change back once discharged if appropriate  Other       Clinically significant medication issues were identified that warrant physician communication and completion of prescribed/recommended actions by midnight of the next day:  No  Name of provider notified for urgent issues identified:   Provider Method of Notification:     Pharmacist comments:   Time spent performing this drug regimen review (minutes):  20   Aristides Luckey A. Lyle, PharmD, BCPS, FNKF Clinical Pharmacist [facility] Please utilize Amion for appropriate phone number to reach the unit pharmacist [facility])  05/16/2024 8:20 AM

## 2024-05-16 NOTE — Progress Notes (Signed)
 Emeline Joesph BROCKS, DO  Physician Physical Medicine and Rehabilitation   PMR Pre-admission    Signed   Date of Service: 05/15/2024 10:00 AM  Related encounter: Admission (Discharged) from 05/07/2024 in [facility] NEURO/TRAUMA/SURGICAL ICU   Signed     Expand All Collapse All  PMR Admission Coordinator Pre-Admission Assessment   Patient: Roger Black is an 68 y.o., male MRN: [id number] DOB: 1956-05-18 Height: 5' 9 (175.3 cm) Weight:     Insurance Information HMO:     PPO: yes     PCP:      IPA:      80/20:      OTHER:  PRIMARY: [employer]      Policy#: [id number]      Subscriber: pt CM Name: Madelin      Phone#: [phone]     Fax#: epic access Pre-Cert#: [id number] auth for CIR from Tammy with [facility] for admit 10/23 with next review date 10/29.  They have epic access for concurrent review.      Employer:  Benefits:  Phone #: [phone]-option 1     Name:  Eff. Date: 07/25/23     Deduct: $0      Out of Pocket Max: $3400 ($680.70 met)      Life Max: n/a CIR: $325/day for days 1-6      SNF: 20 full days Outpatient:      Co-Pay: $15/visit Home Health: 100%      Co-Pay:  DME: 75%     Co-Pay: 25% Providers:  SECONDARY:       Policy#:      Phone#:    Artist:       Phone#:    The Engineer, materials Information Summary" for patients in Inpatient Rehabilitation Facilities with attached "Privacy Act Statement-Health Care Records" was provided and verbally reviewed with: Patient and Family   Emergency Contact Information Contact Information       Name Relation Home Work Mobile    Convery,Anita Spouse [phone]           Other Contacts   None on File        Current Medical History  Patient Admitting Diagnosis: 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  Pt had LP which was ultimately inconclusive.  MRI brain w and w/o showed circumscribed enhancing lesions in the right frontal lobe and left cerebellar hemisphere with associate vasogenic edema and no MLS or mass effect.  CT chest/abd/pelvis and bone scan without evidence of primary malignancy.  He was transferred to [facility] on 10/15 for further neurosurgical consultation with Dr. [name] who recommended R sided crani for biopsy/subtotal resection as lesion was attached to a vessel and not amenable to total resection.  This was completed on 10/16.  Post op course pain management and dex taper.  Final pathology showed metastatic carcinoma from the tumor biopsies.  Nonspecific staining noted but overall it was felt to be metastatic carcinoma of lung origin.  Radiation oncology consulted  and recommended 2 weeks of palliative radiotherapy to cauda equina for 10 fractions and 3 fractions of stereotactic radiosurgery to the brain.  He had a simulation for his spinal radiation on 10/22 with tx to start on 10/23 for 10 sessions, and will have simulation for his brain radiation on 10/27 with tx to start on 10/28 for 3 sessions.  Therapy ongoing and recommendations are for CIR.    Patient's medical record from [facility] and [facility] has been reviewed by the rehabilitation admission coordinator and physician.   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 Yes   Family History   family history includes Hypertension in his father and mother.   Current Medications  Current Medications    Current Facility-Administered Medications:    acetaminophen (TYLENOL) tablet 650 mg, 650 mg, Oral, Q6H PRN, 650 mg at 05/15/24 0602 **OR** acetaminophen (TYLENOL) suppository 650 mg, 650 mg, Rectal, Q6H PRN, [name], MD   atorvastatin (LIPITOR) tablet 80 mg, 80 mg, Oral, Daily, [name], MD, 80 mg at 05/15/24 9171   baclofen (LIORESAL) tablet 10 mg, 10 mg, Oral, BID, [name], [name], MD, 10 mg at 05/15/24 0827   butalbital-acetaminophen-caffeine (FIORICET) 50-325-40 MG per tablet 2 tablet, 2 tablet, Oral, Q4H PRN, [name], [name], MD, 2 tablet at 05/11/24 1854   Chlorhexidine Gluconate Cloth 2 % PADS 6 each, 6 each, Topical, Q0600, [name], [name], MD, 6 each at 05/14/24 1601   [EXPIRED] dexamethasone (DECADRON) tablet 6 mg, 6 mg, Oral, Q6H, 6 mg at 05/09/24 0510 **FOLLOWED BY** [EXPIRED] dexamethasone (DECADRON) tablet 4 mg, 4 mg, Oral, Q6H, 4 mg at 05/10/24 0613 **FOLLOWED BY** dexamethasone (DECADRON) tablet 4 mg, 4 mg, Oral, Q8H, [name], [name], MD, 4 mg at 05/15/24 0602   doxazosin (CARDURA) tablet 2 mg, 2 mg, Oral, QHS, [name], [name], MD, 2 mg at 05/14/24 2212   DULoxetine (CYMBALTA) DR capsule 60 mg, 60 mg, Oral, Daily, [name], MD, 60 mg at 05/15/24 0828    enoxaparin (LOVENOX) injection 40 mg, 40 mg, Subcutaneous, Daily, Pokhrel, Laxman, MD, 40 mg at 05/15/24 0829   fluticasone furoate-vilanterol (BREO ELLIPTA) 100-25 MCG/ACT 1 puff, 1 puff, Inhalation, Daily, [name], MD, 1 puff at 05/15/24 0758   irbesartan (AVAPRO) tablet 300 mg, 300 mg, Oral, Daily, [name], MD, 300 mg at 05/15/24 9171   labetalol (NORMODYNE) injection 10-40 mg, 10-40 mg, Intravenous, Q10 min PRN, [name], [name], MD, 20 mg at 05/09/24 8048  montelukast (SINGULAIR) tablet 10 mg, 10 mg, Oral, Daily, [name], MD, 10 mg at 05/15/24 0828   ondansetron (ZOFRAN) tablet 4 mg, 4 mg, Oral, Q4H PRN **OR** ondansetron (ZOFRAN) injection 4 mg, 4 mg, Intravenous, Q4H PRN, [name], [name], MD   Oral care mouth rinse, 15 mL, Mouth Rinse, PRN, Desai, Nikita S, MD   oxyCODONE (Oxy IR/ROXICODONE) immediate release tablet 5 mg, 5 mg, Oral, Q4H PRN, Desai, Nikita S, MD, 5 mg at 05/15/24 0738   pantoprazole (PROTONIX) EC tablet 40 mg, 40 mg, Oral, Daily, [name], MD, 40 mg at 05/15/24 0827   polyethylene glycol (MIRALAX / GLYCOLAX) packet 17 g, 17 g, Oral, BID, Pokhrel, Laxman, MD, 17 g at 05/15/24 0828   pregabalin (LYRICA) capsule 150 mg, 150 mg, Oral, TID, [name], MD, 150 mg at 05/15/24 9171   promethazine (PHENERGAN) tablet 12.5-25 mg, 12.5-25 mg, Oral, Q4H PRN, [name], [name], MD   senna-docusate (Senokot-S) tablet 2 tablet, 2 tablet, Oral, BID, Wilson, Tara N, PA-C, 2 tablet at 05/15/24 0827   white petrolatum (VASELINE) gel, , Topical, PRN, Desai, Nikita S, MD   zolpidem (AMBIEN) tablet 5 mg, 5 mg, Oral, QHS PRN, [name], MD, 5 mg at 05/12/24 2318     Patients Current Diet:  Diet Order                  Diet regular Fluid consistency: Thin  Diet effective now                         Precautions / Restrictions Precautions Precautions: Fall Precaution/Restrictions Comments: BP  <150 Restrictions Weight Bearing Restrictions Per Provider Order: No    Has the patient had 2 or more falls or a fall with injury in the past year? Yes   Prior Activity Level Community (5-7x/wk): independent, working, driving, no DME   Prior Functional Level Self Care: Did the patient need help bathing, dressing, using the toilet or eating? Independent   Indoor Mobility: Did the patient need assistance with walking from room to room (with or without device)? Independent   Stairs: Did the patient need assistance with internal or external stairs (with or without device)? Independent   Functional Cognition: Did the patient need help planning regular tasks such as shopping or remembering to take medications? Independent   Patient Information Are you of Hispanic, Latino/a,or Spanish origin?: A. No, not of Hispanic, Latino/a, or Spanish origin What is your race?: A. White Do you need or want an interpreter to communicate with a doctor or health care staff?: 0. No   Patient's Response To:  Health Literacy and Transportation Is the patient able to respond to health literacy and transportation needs?: Yes Health Literacy - How often do you need to have someone help you when you read instructions, pamphlets, or other written material from your doctor or pharmacy?: Never In the past 12 months, has lack of transportation kept you from medical appointments or from getting medications?: No In the past 12 months, has lack of transportation kept you from meetings, work, or from getting things needed for daily living?: No   Home Assistive Devices / Equipment Home Equipment: [place] - single point   Prior Device Use: Indicate devices/aids used by the patient prior to current illness, exacerbation or injury? None of the above   Current Functional Level Cognition   Orientation Level: Oriented X4    Extremity Assessment (includes Sensation/Coordination)  Upper Extremity Assessment: Generalized  weakness  Lower Extremity Assessment: Defer to PT evaluation     ADLs   Overall ADL's : Needs assistance/impaired Grooming: Set up, Sitting Upper Body Dressing : Contact guard assist, Sitting Lower Body Dressing: Moderate assistance, Sit to/from stand Toilet Transfer: Minimal assistance, +2 for physical assistance Toilet Transfer Details (indicate cue type and reason): bil hand held support at EOB Functional mobility during ADLs: Minimal assistance, +2 for physical assistance     Mobility   Overal bed mobility: Needs Assistance Bed Mobility: Rolling, Sidelying to Sit, Sit to Sidelying Rolling: Supervision Sidelying to sit: Min assist, +2 for safety/equipment Supine to sit: Min assist, +2 for safety/equipment Sit to supine: HOB elevated, Min assist General bed mobility comments: increased time and cueing for technique, minA for trunk elevation     Transfers   Overall transfer level: Needs assistance Equipment used: Rolling walker (2 wheels) Transfers: Sit to/from Stand Sit to Stand: Min assist, +2 physical assistance Step pivot transfers: Mod assist, +2 safety/equipment General transfer comment: minA to power up, pt with wide base of support, required significant time and 2 trials prior to standing, modA for step pvt from recliner to [facility] via HHA, max directional verbal cues     Ambulation / Gait / Stairs / Wheelchair Mobility   Ambulation/Gait Ambulation/Gait assistance: Min assist, +2 safety/equipment Gait Distance (Feet): 30 Feet Assistive device: Rolling walker (2 wheels) Gait Pattern/deviations: Decreased stride length, Wide base of support, Staggering right, Staggering left General Gait Details: pt with noted lateral sway L/R and ant/posterior, pt became more confused/delayed response however VSS, pt stating Im just trying to figure out what I'm doing, minA for walker management and 2nd person for chair follow and line management Gait velocity: decreased Gait velocity  interpretation: <1.31 ft/sec, indicative of household ambulator     Posture / Balance Dynamic Sitting Balance Sitting balance - Comments: min guard Balance Overall balance assessment: Needs assistance Sitting-balance support: No upper extremity supported, Feet supported Sitting balance-Leahy Scale: Fair Sitting balance - Comments: min guard Standing balance support: Bilateral upper extremity supported, During functional activity Standing balance-Leahy Scale: Poor Standing balance comment: relies on external support     Special considerations/life events  Skin surgical incision to R lateral head, Behavioral consideration s/p brain tumor resection (confusion, poor recall/safety, no agitation/aggression noted, and Special service needs Radiation scheduled 10/23 through 11/5 week days.  [facility] arranged through 10/31.  If pt remains on CIR after 10/31 will need CSW to arrange [facility] for the week of 11/3.      Previous Home Environment (from acute therapy documentation) Living Arrangements: Spouse/significant other Available Help at Discharge: Family, Available 24 hours/day Type of Home: House Home Layout: One level Home Access: Level entry Bathroom Shower/Tub: Engineer, manufacturing systems: Standard Home Care Services: No   Discharge Living Setting Plans for Discharge Living Setting: Patient's home, Lives with (comment) (spouse, [name]) Type of Home at Discharge: House Discharge Home Layout: One level Discharge Home Access: Level entry Discharge Bathroom Shower/Tub: Tub/shower unit Discharge Bathroom Toilet: Standard Discharge Bathroom Accessibility: Yes How Accessible: Accessible via walker Does the patient have any problems obtaining your medications?: No   Social/Family/Support Systems Patient Roles: Spouse Anticipated Caregiver: Murad Staples Anticipated Caregiver's Contact Information: [phone] Ability/Limitations of Caregiver: none stated Caregiver Availability:  24/7 Discharge Plan Discussed with Primary Caregiver: Yes Is Caregiver In Agreement with Plan?: Yes Does Caregiver/Family have Issues with Lodging/Transportation while Pt is in Rehab?: No   Goals Patient/Family Goal for Rehab:  PT/OT/SLP Supervision Expected length of stay: 9-12 days Additional Information: Discharge plan: home with spouse who can provide 24/7 supervision Pt/Family Agrees to Admission and willing to participate: Yes Program Orientation Provided & Reviewed with Pt/Caregiver Including Roles  & Responsibilities: Yes Additional Information Needs: Radiation txs scheduled, [facility] arranged for radiation 10/23 through 10/31, if pt remains after 10/31 CSW will need to arrange transport for 11/3 through 11/5   Decrease burden of Care through IP rehab admission: n/a   Possible need for SNF placement upon discharge: No.  Plan for discharge home with spouse to provide 24/7 supervision    Patient Condition: This patient's condition remains as documented in the consult dated 10/21, in which the Rehabilitation Physician determined and documented that the patient's condition is appropriate for intensive rehabilitative care in an inpatient rehabilitation facility. Will admit to inpatient rehab today.   Preadmission Screen Completed By:  Yuridiana Formanek E Kaithlyn Teagle, PT, DPT 05/15/2024 10:00 AM ______________________________________________________________________   Discussed status with Dr. Emeline on 05/15/24  at 10:43 AM  and received approval for admission today.   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 Yes Co-Morbidities requiring supervision/potential  complications: COPD, HFrEF, CJD stage IIIa, HTN, poor pain control, anxiety Due to safety, skin/wound care, disease management, medication administration, pain management, and patient education, does the patient require 24 hr/day rehab nursing? Yes Does the patient require coordinated care of a physician, rehab nurse, PT, OT, and SLP to address physical and functional deficits in the context of the above medical diagnosis(es)? Yes Addressing deficits in the following areas: balance, endurance, locomotion, strength, transferring, bathing, dressing, feeding, grooming, toileting, and cognition Can the patient actively participate in an intensive therapy program of at least 3 hrs of therapy 5 days a week? Yes The potential for patient to make measurable gains while on inpatient rehab is good Anticipated functional outcomes upon discharge from inpatient rehab: supervision PT, supervision OT, supervision SLP Estimated rehab length of stay to reach the above functional goals is: 9-12 days Anticipated discharge destination: Home 10. Overall Rehab/Functional Prognosis: good     MD Signature:   Joesph JAYSON Emeline, DO 05/15/2024           Revision History

## 2024-05-16 NOTE — Plan of Care (Signed)
  Problem: Consults Goal: RH BRAIN INJURY PATIENT EDUCATION Description: Description: See Patient Education module for eduction specifics Outcome: Progressing   Problem: RH BOWEL ELIMINATION Goal: RH STG MANAGE BOWEL WITH ASSISTANCE Description: STG Manage Bowel with supervision Assistance. Outcome: Progressing   Problem: RH BLADDER ELIMINATION Goal: RH STG MANAGE BLADDER WITH ASSISTANCE Description: STG Manage Bladder With supervision Assistance Outcome: Progressing   Problem: RH SKIN INTEGRITY Goal: RH STG SKIN FREE OF INFECTION/BREAKDOWN Description: Manage skin free of infection with supervision assistance Outcome: Progressing   Problem: RH SAFETY Goal: RH STG ADHERE TO SAFETY PRECAUTIONS W/ASSISTANCE/DEVICE Description: STG Adhere to Safety Precautions With supervision Assistance/Device. Outcome: Progressing   Problem: RH PAIN MANAGEMENT Goal: RH STG PAIN MANAGED AT OR BELOW PT'S PAIN GOAL Description: <4 w/ prns Outcome: Progressing   Problem: RH KNOWLEDGE DEFICIT BRAIN INJURY Goal: RH STG INCREASE KNOWLEDGE OF SELF CARE AFTER BRAIN INJURY Description: Manage increase knowledge of self care after brain injury with supervision assistance from wife using educational materials provided Outcome: Progressing   Problem: Education: Goal: Knowledge of General Education information will improve Description: Including pain rating scale, medication(s)/side effects and non-pharmacologic comfort measures Outcome: Progressing   Problem: Health Behavior/Discharge Planning: Goal: Ability to manage health-related needs will improve Outcome: Progressing   Problem: Clinical Measurements: Goal: Ability to maintain clinical measurements within normal limits will improve Outcome: Progressing Goal: Will remain free from infection Outcome: Progressing Goal: Diagnostic test results will improve Outcome: Progressing Goal: Respiratory complications will improve Outcome:  Progressing Goal: Cardiovascular complication will be avoided Outcome: Progressing   Problem: Activity: Goal: Risk for activity intolerance will decrease Outcome: Progressing   Problem: Nutrition: Goal: Adequate nutrition will be maintained Outcome: Progressing   Problem: Coping: Goal: Level of anxiety will decrease Outcome: Progressing   Problem: Elimination: Goal: Will not experience complications related to bowel motility Outcome: Progressing Goal: Will not experience complications related to urinary retention Outcome: Progressing   Problem: Pain Managment: Goal: General experience of comfort will improve and/or be controlled Outcome: Progressing   Problem: Safety: Goal: Ability to remain free from injury will improve Outcome: Progressing   Problem: Skin Integrity: Goal: Risk for impaired skin integrity will decrease Outcome: Progressing

## 2024-05-16 NOTE — Evaluation (Signed)
 Physical Therapy Assessment and Plan  Patient Details  Name: Roger Black MRN: [id number] Date of Birth: 1956-03-22  PT Diagnosis: Difficulty walking and Muscle weakness Rehab Potential: Good ELOS: 10-14 Days   Today's Date: 05/16/2024 PT Individual Time: 1050-1200 PT Individual Time Calculation (min): 70 min    Hospital Problem: Principal Problem:   Metastatic cancer to brain [facility] : [facility])   Past Medical History:  Past Medical History:  Diagnosis Date   Actinic keratosis    Aortic atherosclerosis    Arthritis    Asthma    Cholelithiasis    Chronic kidney disease, stage 3a (HCC)    Chronic systolic CHF (congestive heart failure) (HCC)    COPD (chronic obstructive pulmonary disease) (HCC)    Coronary artery disease    Emphysema lung (HCC)    HTN (hypertension)    Hyperlipidemia    Nephrolithiasis    Personal history of tobacco use, presenting hazards to health 09/21/2015   Sleep apnea    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   right lateral hand, treating with 5FU/calcipotriene   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   vertex scalp, treating with 5FU/calcipotriene   Past Surgical History:  Past Surgical History:  Procedure Laterality Date   APPLICATION OF CRANIAL NAVIGATION Right 05/08/2024   Procedure: COMPUTER-ASSISTED NAVIGATION, FOR CRANIAL PROCEDURE;  Surgeon: [name] Dino HERO, MD;  Location: [facility] OR;  Service: Neurosurgery;  Laterality: Right;   COLON SURGERY     COLONOSCOPY WITH PROPOFOL  N/A 10/20/2020   Procedure: COLONOSCOPY WITH PROPOFOL ;  Surgeon: Toledo, Ladell POUR, MD;  Location: [facility] ENDOSCOPY;  Service: Gastroenterology;  Laterality: N/A;   CRANIOTOMY Right 05/08/2024   Procedure: CRANIOTOMY TUMOR EXCISION;  Surgeon: [name] Dino HERO, MD;  Location: [facility] OR;  Service: Neurosurgery;  Laterality: Right;    Assessment & Plan Clinical Impression: Patient is a 68 year old male with history of HTN, CHF, COPD, CKD,  OSA,gout, Left upper  lobe bronchogenic cancer s/p XRT, left shoulder pain (going to outpatient Tx) and onset of LLE pain with work MRI lumbar spine revealing multilevel stenosis with enhancement of nerve roots progressing to inability to walk. He was seen in ED on 10/06 and further imaging done showing abnormal linear enhancement along surface of thoracic spine rasing concern for leptomeningeal metastatic disease or inflammatory v/s infectious process as well as increased moderate stenosis and severe R>L neural foraminal stenosis C7-T1, unchanged moderate to severe bilateral neural foraminal stenosis C5/C6. Brain MRI done revealing enhancing lesion in right frontal lobe and left cerebellar hemisphere concerning for metastatic disease with associated vasogenic edema. Dr.[name] consulted and patient discussed with Dr. Melanee and Dr. [name] with recommendations of biopsy v/s resection. Palliative care consulted  and patient elected full scope of care. He underwent right craniotomy for resection of brain tumor on 10/16 and post op completed 7 day course of Keppra for seizure prophylaxis. Has had some confusion at night and pulled out JP drain.    NM bone scan negative for metastatic disease. Started on Lyrica for back and leg pain as well as baclofen for muscle spasms. He has elected on DNR with plans to persue treatment. Patient transferred to CIR on 05/15/2024 .   Patient currently requires mod with mobility secondary to muscle weakness, decreased cardiorespiratoy endurance, decreased coordination and decreased motor planning, and decreased sitting balance, decreased standing balance, decreased postural control, and decreased balance strategies.  Prior to hospitalization, patient was independent  with mobility and lived with   in a  House home.  Home access is  Level entry.  Patient will benefit from skilled PT intervention to maximize safe functional mobility, minimize fall risk, and decrease caregiver burden for planned discharge  home with 24 hour supervision.  Anticipate patient will benefit from follow up HH at discharge.  PT - End of Session Activity Tolerance: Tolerates 10 - 20 min activity with multiple rests Endurance Deficit: Yes Endurance Deficit Description: generalized weakness PT Assessment Rehab Potential (ACUTE/IP ONLY): Good PT Patient demonstrates impairments in the following area(s): Balance;Behavior;Endurance;Motor;Pain;Safety;Sensory PT Transfers Functional Problem(s): Bed Mobility;Bed to Chair;Car;Furniture PT Locomotion Functional Problem(s): Ambulation;Stairs PT Plan PT Intensity: Minimum of 1-2 x/day ,45 to 90 minutes PT Frequency: 5 out of 7 days PT Duration Estimated Length of Stay: 10-14 Days PT Treatment/Interventions: Ambulation/gait training;Community reintegration;DME/adaptive equipment instruction;Neuromuscular re-education;Psychosocial support;Stair training;UE/LE Strength taining/ROM;Wheelchair propulsion/positioning;Balance/vestibular training;Discharge planning;Functional electrical stimulation;Pain management;Skin care/wound management;Therapeutic Activities;UE/LE Coordination activities;Cognitive remediation/compensation;Disease management/prevention;Functional mobility training;Patient/family education;Splinting/orthotics;Therapeutic Exercise;Visual/perceptual remediation/compensation PT Transfers Anticipated Outcome(s): Supervision PT Locomotion Anticipated Outcome(s): Supervision PT Recommendation Recommendations for Other Services: Neuropsych consult;Therapeutic Recreation consult Therapeutic Recreation Interventions: Stress management Follow Up Recommendations: Home health PT;24 hour supervision/assistance Patient destination: Home Equipment Recommended: To be determined   PT Evaluation Precautions/Restrictions Precautions Precautions: Fall Precaution/Restrictions Comments: BP <150 Restrictions Weight Bearing Restrictions Per Provider Order: No General Chart Reviewed:  Yes Family/Caregiver Present: No  Pain Interference Pain Interference Pain Effect on Sleep: 4. Almost constantly Pain Interference with Therapy Activities: 4. Almost constantly Pain Interference with Day-to-Day Activities: 4. Almost constantly Home Living/Prior Functioning Home Living Available Help at Discharge: Family;Available 24 hours/day Type of Home: House Home Access: Level entry Home Layout: One level Bathroom Shower/Tub: Engineer, manufacturing systems: Standard Bathroom Accessibility: Yes Prior Function Level of Independence: Independent with basic ADLs;Independent with gait;Independent with transfers  Able to Take Stairs?: Yes Driving: Yes Vocation: Full time employment Vision/Perception  Vision - History Ability to See in Adequate Light: 1 Impaired Vision - Assessment Tracking/Visual Pursuits: Right eye does not track laterally Perception Perception: Within Functional Limits Praxis Praxis: WFL  Cognition Overall Cognitive Status: Impaired/Different from baseline Arousal/Alertness: Awake/alert Orientation Level: Oriented X4 Year: 2025 Month: October Day of Week: Correct Attention: Selective Selective Attention: Appears intact Memory: Impaired Memory Impairment: Retrieval deficit;Decreased short term memory Decreased Short Term Memory: Verbal complex;Functional complex Awareness: Appears intact Problem Solving: Impaired Problem Solving Impairment: Functional complex Safety/Judgment: Appears intact Sensation Sensation Light Touch: Impaired Detail Central sensation comments: BLE burning and tingling down the back of his legs Light Touch Impaired Details: Impaired RLE;Impaired LLE Coordination Gross Motor Movements are Fluid and Coordinated: No Fine Motor Movements are Fluid and Coordinated: Yes Coordination and Movement Description: generalized weakness, pain limiting Finger Nose Finger Test: WNL Motor  Motor Motor: Other (comment) Motor - Skilled  Clinical Observations: generalized weakness  Trunk/Postural Assessment  Cervical Assessment Cervical Assessment: Within Functional Limits Thoracic Assessment Thoracic Assessment: Within Functional Limits Lumbar Assessment Lumbar Assessment: Within Functional Limits Postural Control Postural Control: Deficits on evaluation Righting Reactions: delayed  Balance Balance Balance Assessed: Yes Static Sitting Balance Static Sitting - Balance Support: Bilateral upper extremity supported Static Sitting - Level of Assistance: 5: Stand by assistance Dynamic Sitting Balance Dynamic Sitting - Balance Support: Bilateral upper extremity supported Dynamic Sitting - Level of Assistance: 5: Stand by assistance Static Standing Balance Static Standing - Balance Support: Right upper extremity supported Static Standing - Level of Assistance: 3: Mod assist Dynamic Standing Balance Dynamic Standing - Balance Support: Right upper extremity supported Dynamic Standing - Level of Assistance:  3: Mod assist;2: Max assist Extremity Assessment  RLE Assessment RLE Assessment: Exceptions to [facility] General Strength Comments: Grossly 4/5 LLE Assessment LLE Assessment: Exceptions to [facility] General Strength Comments: Grossly 3/5  Care Tool Care Tool Bed Mobility Roll left and right activity   Roll left and right assist level: Moderate Assistance - Patient 50 - 74%    Sit to lying activity   Sit to lying assist level: Moderate Assistance - Patient 50 - 74%    Lying to sitting on side of bed activity   Lying to sitting on side of bed assist level: the ability to move from lying on the back to sitting on the side of the bed with no back support.: Moderate Assistance - Patient 50 - 74%     Care Tool Transfers Sit to stand transfer   Sit to stand assist level: Moderate Assistance - Patient 50 - 74%    Chair/bed transfer   Chair/bed transfer assist level: Moderate Assistance - Patient 50 - 74%    Car transfer Car  transfer activity did not occur: Safety/medical concerns        Care Tool Locomotion Ambulation   Assist level: Moderate Assistance - Patient 50 - 74% Assistive device: No Device Max distance: 15'  Walk 10 feet activity   Assist level: Moderate Assistance - Patient - 50 - 74% Assistive device: No Device   Walk 50 feet with 2 turns activity Walk 50 feet with 2 turns activity did not occur: Safety/medical concerns      Walk 150 feet activity Walk 150 feet activity did not occur: Safety/medical concerns      Walk 10 feet on uneven surfaces activity Walk 10 feet on uneven surfaces activity did not occur: Safety/medical concerns      Stairs Stair activity did not occur: Safety/medical concerns        Walk up/down 1 step activity Walk up/down 1 step or curb (drop down) activity did not occur: Safety/medical concerns      Walk up/down 4 steps activity Walk up/down 4 steps activity did not occur: Safety/medical concerns      Walk up/down 12 steps activity Walk up/down 12 steps activity did not occur: Safety/medical concerns      Pick up small objects from floor   Pick up small object from the floor assist level: Total Assistance - Patient < 25%    Wheelchair Is the patient using a wheelchair?: Yes Type of Wheelchair: Manual   Wheelchair assist level: Dependent - Patient 0% Max wheelchair distance: 150'  Wheel 50 feet with 2 turns activity   Assist Level: Dependent - Patient 0%  Wheel 150 feet activity   Assist Level: Dependent - Patient 0%    Refer to Care Plan for Long Term Goals  SHORT TERM GOAL WEEK 1 PT Short Term Goal 1 (Week 1): Pt will complete bed mobility with minA consistently. PT Short Term Goal 2 (Week 1): Pt will complete sit to stand with minA consistently. PT Short Term Goal 3 (Week 1): Pt will complete bed to chair transfer with minA consistently. PT Short Term Goal 4 (Week 1): Pt will ambulate x100' with minA and LRAD.  Recommendations for other  services: Neuropsych and Therapeutic Recreation  Stress management  Skilled Therapeutic Intervention Evaluation completed (see details above and below) with education on PT POC and goals and individual treatment initiated with focus on bed mobility, balance, transfers, ambulation, car transfer, and stair training. Pt received supine in bed and agrees to  therapy. Reports pain in bilateral hips. Reports pain is like a charlie horse. PT provides rest breaks as needed and gentle mobility to manage pain. Pt performs supine to sit with modA Rt HHA and cues for sequencing and body mechanics. Pt performs sit to stand with modA Lt HHA and cues for anterior weight shifting and powering up. Pt ambulates to toilet, x15', with modA at trunk for stability, and cues to  utilize handrails for safety. Pt requires modA to stand from toilet with use of grab bars and PT provides totalA for pericare. Pt ambulates back to WC with modA and RW, with cues for positioning. Pt in significant pain and discomfort upon sitting back down in WC, requiring extended seated rest break. Pt then stands and ambulates additional x15' with RW and minA +1, with +2 WC follow for safety. PT provides cues for safe AD management, posture, and positioning for return to WC. Pt performs stand step back to bed with minA and cues for positioning. ModA required for sit to supine with management of BLEs. Left in position of comfort with all needs within reach.   Mobility Bed Mobility Bed Mobility: Sit to Supine;Supine to Sit Supine to Sit: Moderate Assistance - Patient 50-74% Sit to Supine: Moderate Assistance - Patient 50-74% Transfers Transfers: Sit to Stand;Stand Pivot Transfers;Stand to Sit Sit to Stand: Moderate Assistance - Patient 50-74% Stand to Sit: Moderate Assistance - Patient 50-74% Stand Pivot Transfers: Moderate Assistance - Patient 50 - 74% Stand Pivot Transfer Details: Verbal cues for precautions/safety;Verbal cues for  technique;Verbal cues for gait pattern;Tactile cues for sequencing Transfer (Assistive device): 1 person hand held assist Locomotion  Gait Ambulation: Yes Gait Assistance: Moderate Assistance - Patient 50-74% Gait Distance (Feet): 15 Feet Assistive device: Rolling walker Gait Assistance Details: Verbal cues for gait pattern;Verbal cues for technique;Tactile cues for posture;Verbal cues for safe use of DME/AE Gait Gait: Yes Gait Pattern: Antalgic;Decreased stride length Gait velocity: decreased Stairs / Additional Locomotion Stairs: No Wheelchair Mobility Wheelchair Mobility: No   Discharge Criteria: Patient will be discharged from PT if patient refuses treatment 3 consecutive times without medical reason, if treatment goals not met, if there is a change in medical status, if patient makes no progress towards goals or if patient is discharged from hospital.  The above assessment, treatment plan, treatment alternatives and goals were discussed and mutually agreed upon: by patient  Elsie JAYSON Dawn, PT, DPT 05/16/2024, 3:53 PM

## 2024-05-16 NOTE — Progress Notes (Signed)
 PROGRESS NOTE   Subjective/Complaints:  No events overnight.  This a.m. in therapies, severe low back and hip pain and spasms preventing much mobility.  Difficult exam due to pain. Vitals stable  AM labs significant for stable hyponatremia 131, resolving AKI BUN/Cr 37/1.14, otherwise stable    05/16/2024    5:12 AM 05/15/2024    9:07 PM 05/15/2024    5:27 PM  Vitals with BMI  Height   5' 8  Weight   200 lbs 13 oz  BMI   30.54  Systolic 129 133   Diastolic 80 86   Pulse 91 95     No results for input(s): GLUCAP in the last 72 hours.   P.o. intakes appropriate   Continent of bladder   Last BM 10/24, large   ROS: Denies fevers, chills, N/V, abdominal pain, constipation, diarrhea, SOB, cough, chest pain, new weakness or paraesthesias.   + Low back/hip spasms, pain  Objective:   No results found. Recent Labs    05/14/24 0247 05/16/24 0727  WBC 6.9 7.8  HGB 14.1 15.2  HCT 42.3 44.8  PLT 186 174   Recent Labs    05/14/24 0247 05/16/24 0727  NA 131* 131*  K 5.0 4.5  CL 101 95*  CO2 21* 23  GLUCOSE 117* 124*  BUN 61* 37*  CREATININE 1.38* 1.14  CALCIUM 9.2 9.5    Intake/Output Summary (Last 24 hours) at 05/16/2024 0955 Last data filed at 05/16/2024 0800 Gross per 24 hour  Intake 940 ml  Output --  Net 940 ml        Physical Exam: Vital Signs Blood pressure 129/80, pulse 91, temperature 97.8 F (36.6 C), resp. rate 19, height (P) 5' 8 (1.727 m), weight (P) 91.1 kg, SpO2 96%.  Constitutional: No apparent distress. Appropriate appearance for age.  HENT: No JVD. Neck Supple. Trachea midline. Atraumatic, normocephalic. Eyes: PERRLA. EOMI. Visual fields grossly intact.  + Difficulty with convergence, especially tracking to the right   Cardiovascular: RRR, no murmurs/rub/gallops. No Edema. Peripheral pulses 2+  Respiratory: CTAB. No rales, rhonchi, or wheezing. On 1 L [place].  Abdomen: + bowel  sounds, normoactive. + distention , no tenderness.  Skin: C/D/I. No apparent lesions. + Craniotomy-staples intact + PIV c/d/i     MSK:      No apparent deformity.       Neurologic exam:  Cognition: AAO to person, place, time and event.  + Impulsive, poor awareness         Strength: Moving all 4 limbs antigravity against resistance; unable to fully assess due to pain         Prior exams:                RUE: 5/5 SA, 5/5 EF, 5/5 EE, 5/5 WE, 5/5 FF, 5/5 FA                LUE:  5/5 SA, 5/5 EF, 5/5 EE, 5/5 WE, 5/5 FF, 5/5 FA                RLE: 4/5 HF, 5/5 KE, 5/5  DF, 5/5  EHL, 5/5  PF  LLE:  4/5 HF, 5/5 KE, 5/5  DF, 5/5  EHL, 5/5  PF   Language: Fluent, No substitutions or neoglisms. No dysarthria. Names 3/3 objects correctly.  Memory: Recalls 2/3 objects at 5 minutes.  Mild deficits.  Insight: Fair insight into current condition.  Mood: Pleasant affect, appropriate mood.  Sensation: To light touch reduced on RLE lateral malleoli; sensitivity in BL LE to light touch Reflexes: 2+ in BL UE and 3+BL LEs. Negative Hoffman's and babinski signs bilaterally.   CN: L eye difficulty converging, otherwise intact Coordination: No apparent tremors. No ataxia on FTN Spasticity: MAS 0 in all extremities.    Assessment/Plan: 1. Functional deficits which require 3+ hours per day of interdisciplinary therapy in a comprehensive inpatient rehab setting. Physiatrist is providing close team supervision and 24 hour management of active medical problems listed below. Physiatrist and rehab team continue to assess barriers to discharge/monitor patient progress toward functional and medical goals  Care Tool:  Bathing    Body parts bathed by patient: Right arm, Left arm, Abdomen, Chest, Front perineal area, Right upper leg, Left upper leg, Right lower leg, Left lower leg, Face   Body parts bathed by helper: Buttocks     Bathing assist Assist Level: Moderate Assistance - Patient 50 -  74%     Upper Body Dressing/Undressing Upper body dressing   What is the patient wearing?: Pull over shirt    Upper body assist Assist Level: Minimal Assistance - Patient > 75%    Lower Body Dressing/Undressing Lower body dressing      What is the patient wearing?: Underwear/pull up, Pants     Lower body assist Assist for lower body dressing: Moderate Assistance - Patient 50 - 74%     Toileting Toileting    Toileting assist Assist for toileting: Moderate Assistance - Patient 50 - 74%     Transfers Chair/bed transfer  Transfers assist     Chair/bed transfer assist level: Moderate Assistance - Patient 50 - 74%     Locomotion Ambulation   Ambulation assist              Walk 10 feet activity   Assist           Walk 50 feet activity   Assist           Walk 150 feet activity   Assist           Walk 10 feet on uneven surface  activity   Assist           Wheelchair     Assist               Wheelchair 50 feet with 2 turns activity    Assist            Wheelchair 150 feet activity     Assist          Blood pressure 129/80, pulse 91, temperature 97.8 F (36.6 C), resp. rate 19, height (P) 5' 8 (1.727 m), weight (P) 91.1 kg, SpO2 96%.  Medical Problem List and Plan: 1. Functional deficits secondary to brain metastasis of NSCLC s/p R craniotomy             -patient may  shower             -ELOS/Goals: 9-12 days, spv PT/OT/SLP   - Stable to continue CIR 2.  Antithrombotics: -DVT/anticoagulation:  Pharmaceutical: Lovenox             -antiplatelet  therapy: N/A  3. Pain Management/spasms: percocet prn.   - 10-24: Extreme difficulty tolerating therapies due to back and lower hip pain and spasms with mobility.  baclofen to 10 mg 3 times daily, add tizanidine 2 mg 3 times daily, schedule OxyContin 10 mg every 12 hours, continue as needed Percocet 5 mg every 4 hours.  4. Mood/Behavior/Sleep: LCSW to  follow for evaluation and support             --continue ambien prn.              -antipsychotic agents: N/A  5. Neuropsych/cognition: This patient may be intermittently  capable of making decisions on his own behalf. 6. Skin/Wound Care: Routine pressure relief measures.  - Postop day #11 craniotomy  will be 10-27 for staple removal  7. Fluids/Electrolytes/Nutrition: Monitor I/O.   8. Lung cancer w/brain mets: XRT started today and at end of day as able to help with tolerance of therapy             --on  Decadron 4 mg TID and fasting BS 120-150 range.  --Will check A1c in am--5.7.  Barely prediabetic.  Continue current management.  9. COPD: On Singulair with albuterol prn.  10. HTN: Monitor BP TID---on avapro and cardura.   - Monitor with therapies today    05/16/2024    3:44 PM 05/16/2024    3:00 PM 05/16/2024    5:12 AM  Vitals with BMI  Height 5' 8    Weight 200 lbs 13 oz    BMI 30.54    Systolic  88 129  Diastolic  68 80  Pulse 94 98 91    11. CAD: On Avapro and statin. Monitor for symptoms with increase in activity.              --continue to hold spironolactone and torsemide.    - weights stable Filed Weights   05/15/24 1727 05/16/24 1544  Weight: 91.1 kg 91.1 kg    12.  Hyponatremia: Na 132 @ admission-->131.  --Recheck BMET in am--stable  13. Acute on chronic renal failure: Encourage fluid intake.  BUN/SCr 27/1.32-->61/1.38 -- Monitor potassium levels as baseline 4.9-5.0-->continue ARB for now.  - Recheck CMET in am--improving BUN 37, creatinine 1.14, potassium 4.5      LOS: 1 days A FACE TO FACE EVALUATION WAS PERFORMED  Joesph JAYSON Likes 05/16/2024, 9:55 AM

## 2024-05-17 DIAGNOSIS — C7931 Secondary malignant neoplasm of brain: Secondary | ICD-10-CM | POA: Diagnosis not present

## 2024-05-17 MED ORDER — ACETAMINOPHEN 500 MG PO TABS
1000.0000 mg | ORAL_TABLET | Freq: Three times a day (TID) | ORAL | Status: DC
  Administered 2024-05-17 – 2024-06-03 (×49): 1000 mg via ORAL
  Filled 2024-05-17 (×52): qty 2

## 2024-05-17 MED ORDER — DOXAZOSIN MESYLATE 2 MG PO TABS
3.0000 mg | ORAL_TABLET | Freq: Every day | ORAL | Status: DC
  Administered 2024-05-17 – 2024-05-22 (×3): 3 mg via ORAL
  Filled 2024-05-17 (×7): qty 1

## 2024-05-17 MED ORDER — OXYCODONE HCL 5 MG PO TABS: 5.0000 mg | ORAL_TABLET | ORAL | Status: DC | PRN

## 2024-05-17 MED ORDER — CLONAZEPAM 0.5 MG PO TABS
0.5000 mg | ORAL_TABLET | Freq: Three times a day (TID) | ORAL | Status: DC | PRN
  Administered 2024-05-18 – 2024-06-02 (×2): 0.5 mg via ORAL
  Filled 2024-05-17 (×2): qty 1

## 2024-05-17 MED ORDER — BACLOFEN 10 MG PO TABS
10.0000 mg | ORAL_TABLET | Freq: Three times a day (TID) | ORAL | Status: DC
  Administered 2024-05-17 – 2024-05-23 (×17): 10 mg via ORAL
  Filled 2024-05-17 (×19): qty 1

## 2024-05-17 MED ORDER — BACLOFEN 5 MG HALF TABLET: 15.0000 mg | ORAL_TABLET | Freq: Three times a day (TID) | ORAL | Status: DC

## 2024-05-17 MED ORDER — DANTROLENE SODIUM 25 MG PO CAPS
25.0000 mg | ORAL_CAPSULE | Freq: Every day | ORAL | Status: AC
Start: 2024-05-17 — End: 2024-05-23
  Administered 2024-05-17 – 2024-05-23 (×7): 25 mg via ORAL
  Filled 2024-05-17 (×7): qty 1

## 2024-05-17 MED ORDER — OXYCODONE HCL ER 10 MG PO T12A
10.0000 mg | EXTENDED_RELEASE_TABLET | Freq: Two times a day (BID) | ORAL | Status: DC
  Administered 2024-05-17 – 2024-05-18 (×2): 10 mg via ORAL
  Filled 2024-05-17 (×2): qty 1

## 2024-05-17 MED ORDER — OXYCODONE HCL ER 15 MG PO T12A: 15.0000 mg | EXTENDED_RELEASE_TABLET | Freq: Two times a day (BID) | ORAL | Status: DC

## 2024-05-17 NOTE — Progress Notes (Signed)
 Occupational Therapy Session Note  Patient Details  Name: Roger Black MRN: [id number] Date of Birth: 1955/08/05  Today's Date: 05/17/2024 OT Individual Time: 1052-1207 OT Individual Time Calculation (min): 75 min    Short Term Goals: Week 1:  OT Short Term Goal 1 (Week 1): Pt will use LRAD and don pants with min A OT Short Term Goal 2 (Week 1): Pt will complete stand pivot to the [facility] with min A OT Short Term Goal 3 (Week 1): Pt will complete toileting tasks with min A OT Short Term Goal 4 (Week 1): Pt will tolerate sitting up in the w/c for 2 hours to demo improved activity tolerance  Skilled Therapeutic Interventions/Progress Updates:    Pt supine, fast asleep- cup of coffee still in his hand and turned over in bed. Pt overall more tired today, often with difficulty keeping his eyes open. He came to EOB with mod A. He attempted to stand x2 with no success despite max A. Raised bed and cued heavily on body mechanics and he was able to stand with mod A using the RW. Transfer to the w/c with mod A. Increased myclonic jerking in his BUE. He was in 8/10 pain throughout session but overall pain tolerated better than yesterday. Repositioned often for pain management. Increased time to complete oral care d/t some possible perseveration, difficulty concentrating, and myoclonic jerking. He completed shaving task and required mod HOH to prevent frequent dropping/twitching with the electric razor in his hand. He completed UB dressing with min A to doff and don new shirt. He stood x3 to doff old pants, don new ones- mod A to stand and max A for LB dressing overall. Stand pivot back to the bed with mod A. He was able to bring LE into bed himself. He was left supine with all needs met, bed alarm set.   Therapy Documentation Precautions:  Precautions Precautions: Fall Precaution/Restrictions Comments: BP <150 Restrictions Weight Bearing Restrictions Per Provider Order: No   Therapy/Group:  Individual Therapy  [name] 05/17/2024, 8:12 AM

## 2024-05-17 NOTE — IPOC Note (Signed)
 Overall Plan of Care [facility]) Patient Details Name: Roger Black MRN: [id number] DOB: 12/31/1955  Admitting Diagnosis: Metastatic cancer to brain [facility] [place])  Hospital Problems: Principal Problem:   Metastatic cancer to brain [facility])     Functional Problem List: Nursing Bladder, Bowel, Edema, Endurance, Medication Management, Motor, Nutrition, Pain, Safety, Skin Integrity  PT Balance, Behavior, Endurance, Motor, Pain, Safety, Sensory  OT Balance, Sensory, Safety, Endurance, Motor, Pain, Vision  SLP Cognition  TR         Basic ADL's: OT Dressing, Bathing, Toileting     Advanced  ADL's: OT None     Transfers: PT Bed Mobility, Bed to Chair, Car, Occupational Psychologist, Research Scientist (life Sciences): PT Ambulation, Stairs     Additional Impairments: OT None  SLP Social Cognition   Problem Solving, Memory  TR      Anticipated Outcomes Item Anticipated Outcome  Self Feeding no goal  Swallowing      Basic self-care  (S)  Toileting  (S)   Bathroom Transfers (S)  Bowel/Bladder  manage bowels with medications/ manage bladder with toileting assistance  Transfers  Supervision  Locomotion  Supervision  Communication     Cognition  supervision  Pain  <4 w/ prns  Safety/Judgment  manage safety with supervision assistance   Therapy Plan: PT Intensity: Minimum of 1-2 x/day ,45 to 90 minutes PT Frequency: 5 out of 7 days PT Duration Estimated Length of Stay: 10-14 Days OT Intensity: Minimum of 1-2 x/day, 45 to 90 minutes OT Frequency: 5 out of 7 days OT Duration/Estimated Length of Stay: 10-14 days SLP Intensity: Minumum of 1-2 x/day, 30 to 90 minutes SLP Frequency: 1 to 3 out of 7 days SLP Duration/Estimated Length of Stay: 10-14 days   Team Interventions: Nursing Interventions Patient/Family Education, Medication Management, Bladder Management, Bowel Management, Disease Management/Prevention, Pain Management, Discharge Planning, Skin Care/Wound Management  PT  interventions Ambulation/gait training, Community reintegration, DME/adaptive equipment instruction, Neuromuscular re-education, Psychosocial support, Stair training, UE/LE Strength taining/ROM, Wheelchair propulsion/positioning, Warden/ranger, Discharge planning, Functional electrical stimulation, Pain management, Skin care/wound management, Therapeutic Activities, UE/LE Coordination activities, Cognitive remediation/compensation, Disease management/prevention, Functional mobility training, Patient/family education, Splinting/orthotics, Therapeutic Exercise, Visual/perceptual remediation/compensation  OT Interventions Balance/vestibular training, Self Care/advanced ADL retraining, Therapeutic Exercise, Wheelchair propulsion/positioning, UE/LE Strength taining/ROM, Pain management, DME/adaptive equipment instruction, Community reintegration, Equities Trader education, UE/LE Coordination activities, Discharge planning, Functional mobility training, Psychosocial support, Therapeutic Activities, Visual/perceptual remediation/compensation  SLP Interventions Cognitive remediation/compensation, Cueing hierarchy, Functional tasks, Therapeutic Activities, Therapeutic Exercise, Patient/family education, Internal/external aids  TR Interventions    SW/CM Interventions Discharge Planning, Psychosocial Support, Patient/Family Education   Barriers to Discharge MD  Medical stability, Home enviroment access/loayout, Lack of/limited family support, Insurance for SNF coverage, Pending surgery, Pending chemo/radiation, and Behavior  Nursing Decreased caregiver support, Home environment access/layout, Incontinence, Pending chemo/radiation Discharge Home Layout: One level  Discharge Home Access: Level entry;  Lives with  (spouse, Interior And Spatial Designer)  PT      OT Decreased caregiver support wife cannot assist  SLP Pending chemo/radiation severity of deficits, diagnosis  SW Decreased caregiver support, Lack of/limited  family support, Insurance for SNF coverage, Other (comments) current radiation treatment   Team Discharge Planning: Destination: PT-Home ,OT- Home , SLP-Home Projected Follow-up: PT-Home health PT, 24 hour supervision/assistance, OT-  Home health OT, SLP-Home Health SLP Projected Equipment Needs: PT-To be determined, OT- To be determined, SLP-None recommended by SLP Equipment Details: PT- , OT-  Patient/family involved in discharge planning: PT- Patient,  OT-Patient, SLP-Patient  MD ELOS: 9-12 days Medical Rehab Prognosis:  Good Assessment: The patient has been admitted for CIR therapies with the diagnosis of brain metastasis s/p R craniotomy. The team will be addressing functional mobility, strength, stamina, balance, safety, adaptive techniques and equipment, self-care, bowel and bladder mgt, patient and caregiver education,  . Goals have been set at supervision PT, OT and SLP. Anticipated discharge destination is home.       See Team Conference Notes for weekly updates to the plan of care

## 2024-05-17 NOTE — Progress Notes (Signed)
 PROGRESS NOTE   Subjective/Complaints:  No events overnight.  Vitals stable Back pain / spasms remain severe; patient thinks they have improved with his current medication regimen but are still far from under control.  They remain worse with sitting and laying down in his bilateral hips and thighs.  Later in the day, therapies report lethargy/confusion after afternoon medications, and increasing myoclonus in the hands throughout the day.  2x BM 10/24; 2x ISC overnight.  Patient initially endorses that he was not sitting up for voiding trials, but nursing clarifies that night team was very vigilant about this.   ROS: Denies fevers, chills, N/V, abdominal pain, constipation, diarrhea, SOB, cough, chest pain, new weakness or paraesthesias.   + Low back/hip spasms, pain + Urinary retention  Objective:   No results found. Recent Labs    05/16/24 0727  WBC 7.8  HGB 15.2  HCT 44.8  PLT 174   Recent Labs    05/16/24 0727  NA 131*  K 4.5  CL 95*  CO2 23  GLUCOSE 124*  BUN 37*  CREATININE 1.14  CALCIUM 9.5    Intake/Output Summary (Last 24 hours) at 05/17/2024 0919 Last data filed at 05/17/2024 9167 Gross per 24 hour  Intake 716 ml  Output 1708 ml  Net -992 ml        Physical Exam: Vital Signs Blood pressure 121/78, pulse 89, temperature 98.1 F (36.7 C), temperature source Oral, resp. rate 16, height 5' 8 (1.727 m), weight 91 kg, SpO2 92%.  Constitutional: No apparent distress. Appropriate appearance for age.  Sitting up in wheelchair. HENT: No JVD. Neck Supple. Trachea midline. Atraumatic, normocephalic. Eyes: PERRLA. EOMI. Visual fields grossly intact.  + Difficulty with convergence, especially tracking to the right   Cardiovascular: RRR, no murmurs/rub/gallops. No Edema. Peripheral pulses 2+  Respiratory: CTAB. No rales, rhonchi, or wheezing. On 1 L [place].  Abdomen: + bowel sounds, normoactive. + distention  , no tenderness.  Skin: C/D/I. No apparent lesions. + Craniotomy-staples intact + PIV c/d/i    MSK:      No apparent deformity.       Neurologic exam:  Cognition: AAO to person, place, time and event.  + Impulsive, poor awareness  Strength: Exam limited by pain with range of motion bilateral lower extremities                RUE: 5/5 SA, 5/5 EF, 5/5 EE, 5/5 WE, 5/5 FF, 5/5 FA                LUE:  5/5 SA, 5/5 EF, 5/5 EE, 5/5 WE, 5/5 FF, 5/5 FA                RLE: 4/5 HF, 5/5 KE, 5/5  DF, 5/5  EHL, 5/5  PF                 LLE:  4/5 HF, 5/5 KE, 5/5  DF, 5/5  EHL, 5/5  PF    Language: Fluent, No substitutions or neoglisms. No dysarthria. Names 3/3 objects correctly.  Memory: Recalls 2/3 objects at 5 minutes.  Mild deficits.  Insight: Fair insight into current condition.  Mood:  Pleasant affect, appropriate mood.  Sensation: sensitivity in BL LE to light touch Reflexes: 2+ in BL UE and 0 BL LEs. Negative Hoffman's and babinski signs bilaterally.   CN: L eye difficulty converging, otherwise intact Coordination: No apparent tremors. No ataxia on FTN Spasticity: MAS 0 in all extremities.  Extensor posturing of the back with spasms.   Assessment/Plan: 1. Functional deficits which require 3+ hours per day of interdisciplinary therapy in a comprehensive inpatient rehab setting. Physiatrist is providing close team supervision and 24 hour management of active medical problems listed below. Physiatrist and rehab team continue to assess barriers to discharge/monitor patient progress toward functional and medical goals  Care Tool:  Bathing    Body parts bathed by patient: Right arm, Left arm, Abdomen, Chest, Front perineal area, Right upper leg, Left upper leg, Right lower leg, Left lower leg, Face   Body parts bathed by helper: Buttocks     Bathing assist Assist Level: Moderate Assistance - Patient 50 - 74%     Upper Body Dressing/Undressing Upper body dressing   What is the patient  wearing?: Pull over shirt    Upper body assist Assist Level: Minimal Assistance - Patient > 75%    Lower Body Dressing/Undressing Lower body dressing      What is the patient wearing?: Underwear/pull up, Pants     Lower body assist Assist for lower body dressing: Moderate Assistance - Patient 50 - 74%     Toileting Toileting    Toileting assist Assist for toileting: Moderate Assistance - Patient 50 - 74%     Transfers Chair/bed transfer  Transfers assist     Chair/bed transfer assist level: Moderate Assistance - Patient 50 - 74%     Locomotion Ambulation   Ambulation assist      Assist level: Moderate Assistance - Patient 50 - 74% Assistive device: No Device Max distance: 15'   Walk 10 feet activity   Assist     Assist level: Moderate Assistance - Patient - 50 - 74% Assistive device: No Device   Walk 50 feet activity   Assist Walk 50 feet with 2 turns activity did not occur: Safety/medical concerns         Walk 150 feet activity   Assist Walk 150 feet activity did not occur: Safety/medical concerns         Walk 10 feet on uneven surface  activity   Assist Walk 10 feet on uneven surfaces activity did not occur: Safety/medical concerns         Wheelchair     Assist Is the patient using a wheelchair?: Yes Type of Wheelchair: Manual    Wheelchair assist level: Dependent - Patient 0% Max wheelchair distance: 150'    Wheelchair 50 feet with 2 turns activity    Assist        Assist Level: Dependent - Patient 0%   Wheelchair 150 feet activity     Assist      Assist Level: Dependent - Patient 0%   Blood pressure 121/78, pulse 89, temperature 98.1 F (36.7 C), temperature source Oral, resp. rate 16, height 5' 8 (1.727 m), weight 91 kg, SpO2 92%.  Medical Problem List and Plan: 1. 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  Antithrombotics: -DVT/anticoagulation:  Pharmaceutical: Lovenox             -antiplatelet therapy: N/A  3. Pain Management/spasms: percocet prn.   - 10-24: Extreme difficulty tolerating therapies due to back and lower hip pain and spasms with mobility.  baclofen to 10 mg 3 times daily, add tizanidine 2 mg 3 times daily, schedule OxyContin 10 mg every 12 hours, continue as needed Percocet 5 mg every 4 hours.  - 10-25: Increased OxyContin to 15 mg twice daily, increase baclofen to 15 mg 3 times daily; immediately notified next therapy session the patient was getting lethargic with these changes, so reverted to prior  4. Mood/Behavior/Sleep: LCSW to follow for evaluation and support             --continue ambien prn.              -antipsychotic agents: N/A  5. Neuropsych/cognition: This patient may be intermittently  capable of making decisions on his own behalf. 6. Skin/Wound Care: Routine pressure relief measures.  - Postop day #11 craniotomy  will be 10-27 for staple removal  7. Fluids/Electrolytes/Nutrition: Monitor I/O.   8.  Hyperglycemia due to steroids for lung cancer w/brain mets: XRT started today and at end of day as able to help with tolerance of therapy             --on  Decadron 4 mg TID and fasting BS 120-150 range.  --Will check A1c in am--5.7.  Barely prediabetic.  Continue current management. -Will check blood sugars daily No results for input(s): GLUCAP in the last 72 hours.    9. COPD: On Singulair with albuterol prn.  10. HTN: Monitor BP TID---on avapro and cardura.   - Monitor with therapies today    05/17/2024    5:04 AM 05/17/2024    3:53 AM 05/16/2024    7:48 PM  Vitals with BMI  Weight 200 lbs 10 oz    BMI 30.51    Systolic  121 98  Diastolic  78 65  Pulse  89 96    11. CAD: On Avapro and statin. Monitor  for symptoms with increase in activity.              --continue to hold spironolactone and torsemide.    - weights stable Filed Weights   05/15/24 1727 05/16/24 1544 05/17/24 0504  Weight: 91.1 kg 91.1 kg 91 kg    12.  Hyponatremia: Na 132 @ admission-->131.  --Recheck BMET in am--stable  13. Acute on chronic renal failure: Encourage fluid intake.  BUN/SCr 27/1.32-->61/1.38 -- Monitor potassium levels as baseline 4.9-5.0-->continue ARB for now.  - Recheck CMET in am--improving BUN 37, creatinine 1.14, potassium 4.5  14.  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  - Baclofen 10 mg 3 times daily  - Tizanidine 2 mg 3 times daily  - 10-25: Start dantrolene 25 mg daily, after 1 week test LFTs and increase to 3 times daily if tolerated.  Add clonazepam 0.5 mg 3 times daily as needed for myoclonus.  May need to add VPA if myoclonus worsens.    LOS: 2 days A FACE TO FACE EVALUATION WAS PERFORMED  Roger Black Likes 05/17/2024, 9:19 AM

## 2024-05-17 NOTE — Progress Notes (Signed)
 Patient was educated  in the morning/noon/ and 330 pm on bladder control and scheduling bathroom breaks, notifying that the patient needs to have output at least every 8 hours,  Took the patient to the commode all three times as well as two additional times in between with help of techs. Patient was unable to output, resulting in a in/out cath  and bladder scan Bladder scan of 260 IN OUT of 400,  PVR of 0 Patient needs more education on the importance of bladder control, provider and charge nurse aware.

## 2024-05-17 NOTE — Progress Notes (Signed)
 Speech Language Pathology Daily Session Note  Patient Details  Name: Eduin Friedel MRN: [id number] Date of Birth: 1956/03/21  Today's Date: 05/17/2024 SLP Individual Time: 0901-1000 SLP Individual Time Calculation (min): 59 min  Short Term Goals: Week 1: SLP Short Term Goal 1 (Week 1): Pt will recall recent/relevant information w/ minA SLP Short Term Goal 2 (Week 1): Pt will solve mildly complex problems w/ minA  Skilled Therapeutic Interventions:  Patient was seen in am to address cognitive re- training. Pt was alert though endorsed significant pain persisting throughout session. In comparison to notes from evaluation on previous date, pt presented with increased memory, processing, and attention difficulty suspect due to significant pain. SLP began discussion on memory compensatory strategies including use of external aids with focus on phone as an aid. Pt able to refer to low tech external aid in his room to identify upcoming radiation treatments as well as therapy schedule given min A. He later recalled information at conclusion of session with min A. SLP also addressing problem solving through challenging pt in solving medical and daily living problems presented verbally. Pt completed task with overall  mod A. Pt was left in bed at conclusion of session. SLP to continue POC.   Pain  Patient did not given a numerical value but stated, oh it's bad. He identified legs as source of pain and feeling of constant charley horse to describe pain.   Therapy/Group: Individual Therapy  Joane GORMAN Fuss 05/17/2024, 12:39 PM

## 2024-05-17 NOTE — Progress Notes (Signed)
 Physical Therapy Session Note  Patient Details  Name: Roger Black MRN: [id number] Date of Birth: 1955/10/14  Today's Date: 05/17/2024 PT Individual Time: 1420-1520 PT Individual Time Calculation (min): 60 min   PT Missed Time: 15 Minutes Missed Time Reason: Patient fatigue;Other (Comment) (difficulty remaining alert)  Short Term Goals: Week 1:  PT Short Term Goal 1 (Week 1): Pt will complete bed mobility with minA consistently. PT Short Term Goal 2 (Week 1): Pt will complete sit to stand with minA consistently. PT Short Term Goal 3 (Week 1): Pt will complete bed to chair transfer with minA consistently. PT Short Term Goal 4 (Week 1): Pt will ambulate x100' with minA and LRAD.  Skilled Therapeutic Interventions/Progress Updates:    Pt presents in room in bed, asleep and does not awaken with verbal or tactile cues but does awaken very slowly with elevating HOB. Pt demonstrating difficulty remaining awake throughout session, requires frequent cues for attention to task. Pt also demonstrating myoclonic jerking in BUEs throughout session, demonstrating difficulty maintaining seated balance and standing balance due to jerking with BUEs giving out frequently when putting pressure on them.  Session focused on OOB and activity tolerance as well as sit to stand transfers and bed mobility. Pt completes bed mobility with supervision with elevated HOB and bed rails, cues for attention to task and sequencing. Pt completes sit to stand to RW with mod assist and completes stand step transfer positioned over [facility] however BUEs buckled and pt lost balance forward, BLEs buckled and pt requires total assist to prevent fall and come to sitting on bed. Pt yelling while losing balance however pt denies pain or injury once seated EOB. Pt comes to supine for safety due to impaired sitting balance and therapist retrieves a stedy. Pt completes sit to stand in stedy with min assist, requires min assist for  seated/standing balance due to BUE jerking and sudden losses of balance forward/backward. Pt requesting to trial urinal, urinal placed with pt in standing however pt unable to void urine x3 min of standing and x2 min of high perched sitting on stedy, completed to promote improved standing tolerance. Pt comes to sitting in WC and completes WC mobility with min assist to correct for R path deviation 150', difficult to redirect during task, completed to promote improved activity tolerance. Pt then completes interval training 30 sec work/30 sec rest x5 min total with BLEs on kinetron in sitting to promote improved activity tolerance, however pt requires max cues and max facilitation to complete task. Pt returned to room and completes stedy transfer back to bed, requires mod assist for sit to supine. DO and RN notified of pt presentation during session. Pt remains semi reclined with all needs within reach, call light in place, bed alarm activated at end of session.  Therapy Documentation Precautions:  Precautions Precautions: Fall Precaution/Restrictions Comments: BP <150 Restrictions Weight Bearing Restrictions Per Provider Order: No    Therapy/Group: Individual Therapy  Reche Ohara PT, DPT 05/17/2024, 3:47 PM

## 2024-05-18 ENCOUNTER — Inpatient Hospital Stay (HOSPITAL_COMMUNITY)

## 2024-05-18 DIAGNOSIS — C7931 Secondary malignant neoplasm of brain: Secondary | ICD-10-CM | POA: Diagnosis not present

## 2024-05-18 DIAGNOSIS — C801 Malignant (primary) neoplasm, unspecified: Secondary | ICD-10-CM | POA: Diagnosis not present

## 2024-05-18 LAB — GLUCOSE, CAPILLARY
Glucose-Capillary: 139 mg/dL — ABNORMAL HIGH (ref 70–99)
Glucose-Capillary: 159 mg/dL — ABNORMAL HIGH (ref 70–99)

## 2024-05-18 MED ORDER — OXYCODONE HCL 5 MG PO TABS
5.0000 mg | ORAL_TABLET | ORAL | Status: DC | PRN
  Administered 2024-05-18 – 2024-05-28 (×13): 5 mg via ORAL
  Filled 2024-05-18 (×15): qty 1

## 2024-05-18 MED ORDER — IRBESARTAN 75 MG PO TABS
150.0000 mg | ORAL_TABLET | Freq: Every day | ORAL | Status: DC
  Administered 2024-05-19: 150 mg via ORAL
  Filled 2024-05-18: qty 2

## 2024-05-18 MED ORDER — GADOBUTROL 1 MMOL/ML IV SOLN
9.0000 mL | Freq: Once | INTRAVENOUS | Status: AC | PRN
  Administered 2024-05-18: 9 mL via INTRAVENOUS

## 2024-05-18 MED ORDER — OXYCODONE HCL 5 MG PO TABS: 5.0000 mg | ORAL_TABLET | Freq: Two times a day (BID) | ORAL | Status: DC

## 2024-05-18 MED ORDER — OXYCODONE HCL 5 MG PO TABS
5.0000 mg | ORAL_TABLET | Freq: Two times a day (BID) | ORAL | Status: DC
  Administered 2024-05-19 – 2024-05-28 (×19): 5 mg via ORAL
  Filled 2024-05-18 (×19): qty 1

## 2024-05-18 MED ORDER — BETHANECHOL CHLORIDE 10 MG PO TABS
10.0000 mg | ORAL_TABLET | Freq: Three times a day (TID) | ORAL | Status: DC
  Administered 2024-05-18 – 2024-05-20 (×7): 10 mg via ORAL
  Filled 2024-05-18 (×7): qty 1

## 2024-05-18 NOTE — Plan of Care (Signed)
  Problem: Consults Goal: RH BRAIN INJURY PATIENT EDUCATION Description: Description: See Patient Education module for eduction specifics Outcome: Progressing   Problem: RH BOWEL ELIMINATION Goal: RH STG MANAGE BOWEL WITH ASSISTANCE Description: STG Manage Bowel with supervision Assistance. Outcome: Progressing   Problem: RH BLADDER ELIMINATION Goal: RH STG MANAGE BLADDER WITH ASSISTANCE Description: STG Manage Bladder With supervision Assistance Outcome: Progressing   Problem: RH SKIN INTEGRITY Goal: RH STG SKIN FREE OF INFECTION/BREAKDOWN Description: Manage skin free of infection with supervision assistance Outcome: Progressing   Problem: RH SAFETY Goal: RH STG ADHERE TO SAFETY PRECAUTIONS W/ASSISTANCE/DEVICE Description: STG Adhere to Safety Precautions With supervision Assistance/Device. Outcome: Progressing   Problem: RH PAIN MANAGEMENT Goal: RH STG PAIN MANAGED AT OR BELOW PT'S PAIN GOAL Description: <4 w/ prns Outcome: Progressing   Problem: RH KNOWLEDGE DEFICIT BRAIN INJURY Goal: RH STG INCREASE KNOWLEDGE OF SELF CARE AFTER BRAIN INJURY Description: Manage increase knowledge of self care after brain injury with supervision assistance from wife using educational materials provided Outcome: Progressing   Problem: Education: Goal: Knowledge of General Education information will improve Description: Including pain rating scale, medication(s)/side effects and non-pharmacologic comfort measures Outcome: Progressing   Problem: Health Behavior/Discharge Planning: Goal: Ability to manage health-related needs will improve Outcome: Progressing   Problem: Clinical Measurements: Goal: Ability to maintain clinical measurements within normal limits will improve Outcome: Progressing Goal: Will remain free from infection Outcome: Progressing Goal: Diagnostic test results will improve Outcome: Progressing Goal: Respiratory complications will improve Outcome:  Progressing Goal: Cardiovascular complication will be avoided Outcome: Progressing   Problem: Activity: Goal: Risk for activity intolerance will decrease Outcome: Progressing   Problem: Nutrition: Goal: Adequate nutrition will be maintained Outcome: Progressing   Problem: Coping: Goal: Level of anxiety will decrease Outcome: Progressing   Problem: Elimination: Goal: Will not experience complications related to bowel motility Outcome: Progressing Goal: Will not experience complications related to urinary retention Outcome: Progressing   Problem: Pain Managment: Goal: General experience of comfort will improve and/or be controlled Outcome: Progressing   Problem: Safety: Goal: Ability to remain free from injury will improve Outcome: Progressing   Problem: Skin Integrity: Goal: Risk for impaired skin integrity will decrease Outcome: Progressing

## 2024-05-18 NOTE — Progress Notes (Signed)
 Patient assisted to the bathroom using a stedy. However, on the way to the bathroom, patient seem to have a hard time keeping his head up. Patient placed back in bed. Advised patient that we will be using a bedpan for tonight for safety.

## 2024-05-18 NOTE — Progress Notes (Addendum)
 PROGRESS NOTE   Subjective/Complaints:  No events overnight.  Blood pressure low, 94/59 this a.m. Pain reported 6 out of 10 overnight, on a.m. exam patient states he is much better than yesterday, pain better controlled and no longer feeling high.  He is somewhat lethargic and in excellent spirits, may be cognitively altered from prior exams.  Still requiring intermittent straight cathing for all voids.   ROS: Denies fevers, chills, N/V, abdominal pain, constipation, diarrhea, SOB, cough, chest pain, new weakness or paraesthesias.   + Low back/hip spasms, pain + Urinary retention  Objective:   No results found. Recent Labs    05/16/24 0727  WBC 7.8  HGB 15.2  HCT 44.8  PLT 174   Recent Labs    05/16/24 0727  NA 131*  K 4.5  CL 95*  CO2 23  GLUCOSE 124*  BUN 37*  CREATININE 1.14  CALCIUM 9.5    Intake/Output Summary (Last 24 hours) at 05/18/2024 1031 Last data filed at 05/18/2024 0527 Gross per 24 hour  Intake 480 ml  Output 1000 ml  Net -520 ml        Physical Exam: Vital Signs Blood pressure (!) 94/59, pulse 79, temperature 97.9 F (36.6 C), temperature source Oral, resp. rate 16, height 5' 8 (1.727 m), weight 91 kg, SpO2 94%.  Constitutional: No apparent distress. Appropriate appearance for age.  Laying in bed. HENT: No JVD. Neck Supple. Trachea midline. Atraumatic, normocephalic. Eyes: PERRLA. EOMI. Visual fields grossly intact.  + Difficulty with convergence, especially tracking to the right   Cardiovascular: RRR, no murmurs/rub/gallops. No Edema. Peripheral pulses 2+  Respiratory: CTAB. No rales, rhonchi, or wheezing. On 1 L [place].  Abdomen: + bowel sounds, normoactive.  No distention , no tenderness.  Skin: C/D/I. No apparent lesions. + Craniotomy-staples intact + PIV c/d/i    MSK:      No apparent deformity.       Neurologic exam:  Cognition: AAO to person, place, time and event.   + Impulsive, poor awareness   Mood: Somewhat lethargic and euphoric today  Strength: Exam limited by pain with range of motion bilateral lower extremities                RUE: 5/5 SA, 5/5 EF, 5/5 EE, 5/5 WE, 5/5 FF, 5/5 FA                LUE:  5/5 SA, 5/5 EF, 5/5 EE, 5/5 WE, 5/5 FF, 5/5 FA                RLE: 4/5 HF, 5/5 KE, 5/5  DF, 5/5  EHL, 5/5  PF                 LLE:  4/5 HF, 5/5 KE, 5/5  DF, 5/5  EHL, 5/5  PF    Language: Fluent, No substitutions or neoglisms. No dysarthria. Names 3/3 objects correctly.  Memory: Recalls 2/3 objects at 5 minutes.  Mild deficits.  Insight: Fair insight into current condition.  Sensation: sensitivity in BL LE to light touch Reflexes: 2+ in BL UE and 0 BL LEs. Negative Hoffman's and babinski signs bilaterally.   CN:  L eye difficulty converging, otherwise intact Coordination: No apparent tremors. No ataxia Spasticity: MAS 0 in all extremities.  Extensor posturing of the back with spasms--much improved today!   Assessment/Plan: 1. Functional deficits which require 3+ hours per day of interdisciplinary therapy in a comprehensive inpatient rehab setting. Physiatrist is providing close team supervision and 24 hour management of active medical problems listed below. Physiatrist and rehab team continue to assess barriers to discharge/monitor patient progress toward functional and medical goals  Care Tool:  Bathing    Body parts bathed by patient: Right arm, Left arm, Abdomen, Chest, Front perineal area, Right upper leg, Left upper leg, Right lower leg, Left lower leg, Face   Body parts bathed by helper: Buttocks     Bathing assist Assist Level: Moderate Assistance - Patient 50 - 74%     Upper Body Dressing/Undressing Upper body dressing   What is the patient wearing?: Pull over shirt    Upper body assist Assist Level: Minimal Assistance - Patient > 75%    Lower Body Dressing/Undressing Lower body dressing      What is the patient wearing?:  Underwear/pull up, Pants     Lower body assist Assist for lower body dressing: Moderate Assistance - Patient 50 - 74%     Toileting Toileting    Toileting assist Assist for toileting: Moderate Assistance - Patient 50 - 74%     Transfers Chair/bed transfer  Transfers assist     Chair/bed transfer assist level: Moderate Assistance - Patient 50 - 74%     Locomotion Ambulation   Ambulation assist      Assist level: Moderate Assistance - Patient 50 - 74% Assistive device: No Device Max distance: 15'   Walk 10 feet activity   Assist     Assist level: Moderate Assistance - Patient - 50 - 74% Assistive device: No Device   Walk 50 feet activity   Assist Walk 50 feet with 2 turns activity did not occur: Safety/medical concerns         Walk 150 feet activity   Assist Walk 150 feet activity did not occur: Safety/medical concerns         Walk 10 feet on uneven surface  activity   Assist Walk 10 feet on uneven surfaces activity did not occur: Safety/medical concerns         Wheelchair     Assist Is the patient using a wheelchair?: Yes Type of Wheelchair: Manual    Wheelchair assist level: Dependent - Patient 0% Max wheelchair distance: 150'    Wheelchair 50 feet with 2 turns activity    Assist        Assist Level: Dependent - Patient 0%   Wheelchair 150 feet activity     Assist      Assist Level: Dependent - Patient 0%   Blood pressure (!) 94/59, pulse 79, temperature 97.9 F (36.6 C), temperature source Oral, resp. rate 16, height 5' 8 (1.727 m), weight 91 kg, SpO2 94%.  Medical Problem List and Plan: 1. 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  Antithrombotics: -DVT/anticoagulation:   Pharmaceutical: Lovenox             -  antiplatelet therapy: N/A  3. Pain Management/spasms: percocet prn.   - 10-24: Extreme difficulty tolerating therapies due to back and lower hip pain and spasms with mobility.  baclofen to 10 mg 3 times daily, add tizanidine 2 mg 3 times daily, schedule OxyContin 10 mg every 12 hours, continue as needed Percocet 5 mg every 4 hours.  - 10-25: Increased OxyContin to 15 mg twice daily, increase baclofen to 15 mg 3 times daily; immediately notified next therapy session the patient was getting lethargic with these changes, so reverted to prior  - 10-26: Patient pain much improved on current regimen.  Unfortunately, appears to be somewhat sedated and euphoric on OxyContin 10 mg twice daily.  Not using as needed oxycodone, will instead schedule 5 mg 2 times daily before therapies in addition to as needed and DC long-acting.  4. Mood/Behavior/Sleep: LCSW to follow for evaluation and support             --continue ambien prn.              -antipsychotic agents: N/A  5. Neuropsych/cognition: This patient may be intermittently  capable of making decisions on his own behalf. 6. Skin/Wound Care: Routine pressure relief measures.  - Postop day #11 craniotomy  will be 10-27 for staple removal  7. Fluids/Electrolytes/Nutrition: Monitor I/O.   8.  Hyperglycemia due to steroids for lung cancer w/brain mets: XRT started today and at end of day as able to help with tolerance of therapy             --on  Decadron 4 mg TID and fasting BS 120-150 range.  --Will check A1c in am--5.7, prediabetic -Will check blood sugars daily--mildly elevated, would trend and consider starting insulin if consistently greater than 150. Recent Labs    05/18/24 0626 05/18/24 1132  GLUCAP 139* 159*      9. COPD: On Singulair with albuterol prn.  10. HTN: Monitor BP TID---on avapro and cardura.   - Monitor with therapies today  - 10-25: Increase Cardura for urinary retention to 3 mg nightly  -  10-26: Blood pressures trending low; reduce Avapro 300 to 150 mg daily.  May need to reduce Cardura again if BP does not tolerate.    05/18/2024    3:29 AM 05/17/2024    7:25 PM 05/17/2024    1:12 PM  Vitals with BMI  Systolic 94 95 134  Diastolic 59 74 86  Pulse 79 77     11. CAD: On Avapro and statin. Monitor for symptoms with increase in activity.              --continue to hold spironolactone and torsemide.    - weights stable Filed Weights   05/15/24 1727 05/16/24 1544 05/17/24 0504  Weight: 91.1 kg 91.1 kg 91 kg    12.  Hyponatremia: Na 132 @ admission-->131.  --Recheck BMET in am--stable  13. Acute on chronic renal failure: Encourage fluid intake.  BUN/SCr 27/1.32-->61/1.38 -- Monitor potassium levels as baseline 4.9-5.0-->continue ARB for now.  - Recheck CMET in am--improving BUN 37, creatinine 1.14, potassium 4.5  14.  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  - Baclofen 10 mg 3 times daily  - Tizanidine 2 mg 3 times daily  - 10-25: Start dantrolene 25 mg daily, after 1 week test LFTs and increase to 3 times daily if tolerated.  Add clonazepam 0.5 mg 3 times daily as needed for myoclonus.  May need to add VPA  if myoclonus worsens.  10-26: Spasms much better controlled, has not needed to use clonazepam yet.  Trend.  15.  Neurogenic bladder/urinary retention.  Likely secondary to cauda equina involvement, flaccid bladder.  - Ongoing requirement of intermittent straight cathing; increase Cardura to 3 mg nightly  - 10-26: Add Urecholine 10 mg 3 times daily--if no response with up titration/intolerable side effects, would transition towards patient learning intermittent straight cathing  LOS: 3 days A FACE TO FACE EVALUATION WAS PERFORMED  Joesph JAYSON Likes 05/18/2024, 10:31 AM

## 2024-05-18 NOTE — Progress Notes (Signed)
 Upon getting patient up to the bathroom at 11 am , the Technician using the steady, Per NT: the patient started having remarkable stiff rigid movements with arms straight out shaky and patient was noticably acting strange and rocking back and forth mumbling and staring off into the distance, NURSE notified.  Upon arrival, the patient appeared at baseline. Vitals and blood glucose  WNL, Klonopin administered , effective   Currently patient resting in bed , no signs of acute destress, denies any discomfort

## 2024-05-19 ENCOUNTER — Other Ambulatory Visit: Payer: Self-pay

## 2024-05-19 ENCOUNTER — Inpatient Hospital Stay: Attending: Radiation Oncology

## 2024-05-19 ENCOUNTER — Ambulatory Visit: Admitting: Radiation Oncology

## 2024-05-19 ENCOUNTER — Ambulatory Visit

## 2024-05-19 DIAGNOSIS — I10 Essential (primary) hypertension: Secondary | ICD-10-CM

## 2024-05-19 DIAGNOSIS — M792 Neuralgia and neuritis, unspecified: Secondary | ICD-10-CM

## 2024-05-19 DIAGNOSIS — R339 Retention of urine, unspecified: Secondary | ICD-10-CM

## 2024-05-19 DIAGNOSIS — Z510 Encounter for antineoplastic radiation therapy: Secondary | ICD-10-CM | POA: Diagnosis not present

## 2024-05-19 DIAGNOSIS — N179 Acute kidney failure, unspecified: Secondary | ICD-10-CM

## 2024-05-19 LAB — RAD ONC ARIA SESSION SUMMARY
Course Elapsed Days: 4
Plan Fractions Treated to Date: 3
Plan Prescribed Dose Per Fraction: 3 Gy
Plan Total Fractions Prescribed: 10
Plan Total Prescribed Dose: 30 Gy
Reference Point Dosage Given to Date: 9 Gy
Reference Point Session Dosage Given: 3 Gy
Session Number: 3

## 2024-05-19 LAB — BASIC METABOLIC PANEL WITH GFR
Anion gap: 12 (ref 5–15)
BUN: 62 mg/dL — ABNORMAL HIGH (ref 8–23)
CO2: 20 mmol/L — ABNORMAL LOW (ref 22–32)
Calcium: 9.3 mg/dL (ref 8.9–10.3)
Chloride: 100 mmol/L (ref 98–111)
Creatinine, Ser: 1.26 mg/dL — ABNORMAL HIGH (ref 0.61–1.24)
GFR, Estimated: >60 mL/min
Glucose, Bld: 110 mg/dL — ABNORMAL HIGH (ref 70–99)
Potassium: 5.1 mmol/L (ref 3.5–5.1)
Sodium: 132 mmol/L — ABNORMAL LOW (ref 135–145)

## 2024-05-19 LAB — CBC
HCT: 43.1 % (ref 39.0–52.0)
Hemoglobin: 14.5 g/dL (ref 13.0–17.0)
MCH: 32.8 pg (ref 26.0–34.0)
MCHC: 33.6 g/dL (ref 30.0–36.0)
MCV: 97.5 fL (ref 80.0–100.0)
Platelets: 164 K/uL (ref 150–400)
RBC: 4.42 MIL/uL (ref 4.22–5.81)
RDW: 12.3 % (ref 11.5–15.5)
WBC: 6.5 K/uL (ref 4.0–10.5)
nRBC: 0 % (ref 0.0–0.2)

## 2024-05-19 LAB — GLUCOSE, CAPILLARY: Glucose-Capillary: 114 mg/dL — ABNORMAL HIGH (ref 70–99)

## 2024-05-19 MED ORDER — SODIUM CHLORIDE 0.9 % IV SOLN: INTRAVENOUS | Status: AC

## 2024-05-19 MED ORDER — LIDOCAINE HCL URETHRAL/MUCOSAL 2 % EX GEL
CUTANEOUS | Status: DC | PRN
  Filled 2024-05-19: qty 6
  Filled 2024-05-19: qty 30

## 2024-05-19 MED ORDER — SODIUM CHLORIDE 0.9 % IV BOLUS
500.0000 mL | Freq: Once | INTRAVENOUS | Status: AC
  Administered 2024-05-19: 500 mL via INTRAVENOUS

## 2024-05-19 NOTE — Progress Notes (Signed)
 Occupational Therapy Session Note  Patient Details  Name: Roger Black MRN: [id number] Date of Birth: 1956-04-05  Today's Date: 05/19/2024 OT Individual Time: 9254-9143 OT Individual Time Calculation (min): 71 min    Short Term Goals: Week 1:  OT Short Term Goal 1 (Week 1): Pt will use LRAD and don pants with min A OT Short Term Goal 2 (Week 1): Pt will complete stand pivot to the [facility] with min A OT Short Term Goal 3 (Week 1): Pt will complete toileting tasks with min A OT Short Term Goal 4 (Week 1): Pt will tolerate sitting up in the w/c for 2 hours to demo improved activity tolerance  Skilled Therapeutic Interventions/Progress Updates:    Pt received supine with 8/10 pain his back and hips- he was repositioned frequently. He was much more clear this morning however extremely verbose. His breakfast arrived after being brought the wrong tray initially. He requested to eat breakfast before starting session. He came to EOB with increased time d/t pt being verbose and requiring redirection. Mod A to come to EOB. Sit > stand and pivot to w/c with mod A with RW. Pt fearful of myoclonic jerking. On the [facility] he had (+) BM void. He completed UB bathing and dressing on the commode with min A. He stood and required total A for peri hygiene and clothing management. He pivoted to the w/c following- he required stern cueing for paying attention to task as he continued to talk and not follow directions. He was able to pivot with min A using the RW.  He completed  oral care at the sink with set up assist. He completed a stand pivot back to bed with mod A . He was left supine with all needs met bed alarm set.   Therapy Documentation Precautions:  Precautions Precautions: Fall Precaution/Restrictions Comments: BP <150 Restrictions Weight Bearing Restrictions Per Provider Order: No Therapy/Group: Individual Therapy  [name] 05/19/2024, 7:43 AM

## 2024-05-19 NOTE — Progress Notes (Signed)
 Patient ID: Roger Black, male   DOB: Nov 25, 1955, 67 y.o.   MRN: [id number]  1234- SW made efforts to contact pt wife on cellphone but goes to voicemail and VM is full.   SW spoke to pt wife in room with pt as he called her with SW in room. SW introduced self, explained role, and discussed discharge process. She reports her oldest dtr will be moving in to stay and help with care for pt. SW shared will provide updates after team conference tomorrow.   SW received updates from [facility] who reported that [facility] was not completed and he was rescheduled to Tuesday 10/28 at 8:30 instead.  [facility] will pick up at 8am tomorrow. He will not have radiation treatment in the afternoon.   1352- SW spoke with Nataya/[facility] to discuss change in appt tomorrow.   SW updated medical team.   Graeme Jude, MSW, LCSW Office: [phone] Cell: [phone] Fax: [phone]

## 2024-05-19 NOTE — Progress Notes (Signed)
 BP low after getting back from XRT. Was >6 hours due to travel and cathed for 700 cc. Will change ordered to 4-6 hours. Patient fatigued appearing--did have 2 therapy sessions prior to leaving for XRT. Patient concerned about needing more caths and discussed need to keep bladder decompressed/avoid bladder injury. Avapro was discontinued today.

## 2024-05-19 NOTE — Progress Notes (Signed)
 Physical Therapy Session Note  Patient Details  Name: Roger Black MRN: [id number] Date of Birth: 13-Dec-1955  Today's Date: 05/19/2024 PT Individual Time: 0945-1030 PT Individual Time Calculation (min): 45 min  and Today's Date: 05/19/2024 PT Missed Time: 30 Minutes Missed Time Reason: Out of hospital appointment  Short Term Goals: Week 1:  PT Short Term Goal 1 (Week 1): Pt will complete bed mobility with minA consistently. PT Short Term Goal 2 (Week 1): Pt will complete sit to stand with minA consistently. PT Short Term Goal 3 (Week 1): Pt will complete bed to chair transfer with minA consistently. PT Short Term Goal 4 (Week 1): Pt will ambulate x100' with minA and LRAD.  Skilled Therapeutic Interventions/Progress Updates:     Pt received supine in bed and agrees to therapy. Reports charlie horse pain in bilateral legs and hips. PT provides rest breaks and mobility to manage pain. Pt performs supine to sit with modA Rt HHA and cues for sequencing and positioning. Pt requires extended time seated at EOB to acclimate to upright positioning.  Pt performs stand step to WC from elevated bed with minA and cues for hand placement and sequencing. Pt transfers to Nustep with minA and cues for positioning and hand placement. Pt completes Nustep for endurance training and with intention of increasing available ROM in BLEs Pt encouraged to complete full available ROM throughout activity, with pt performing very small amplitude movements initially. Pt completes x6:00 with average steps per minute ~35. Rad Onc arrives in dayroom to take pt to radiation appointment. Pt performs stand step transfer to transport stretcher with RW and minA, with cues for sequencing and positioning. Pt left with transport services. Pt misses 30 minutes of skilled PT.   Therapy Documentation Precautions:  Precautions Precautions: Fall Precaution/Restrictions Comments: BP <150 Restrictions Weight Bearing  Restrictions Per Provider Order: No   Therapy/Group: Individual Therapy  Elsie JAYSON Dawn PT, DPT 05/19/2024, 3:50 PM

## 2024-05-19 NOTE — Progress Notes (Addendum)
 PROGRESS NOTE   Subjective/Complaints:  Didn't get a lot of sleep especially early this morning d/t leg pain, urine retention, lab draw  ROS: Patient denies fever, rash, sore throat, blurred vision, dizziness, nausea, vomiting, diarrhea, cough, shortness of breath or chest pain, joint or back/neck pain, headache, or mood change.   Objective:   MR BRAIN W WO CONTRAST Result Date: 05/18/2024 EXAM: MRI BRAIN WITH AND WITHOUT CONTRAST 05/18/2024 04:45:00 PM TECHNIQUE: Multiplanar multisequence MRI of the head/brain was performed with and without the administration of intravenous contrast. COMPARISON: MRI May 05, 2024 CLINICAL HISTORY: Metastatic disease evaluation; 3T SRS Protocol for radiation treatment planning. FINDINGS: BRAIN AND VENTRICLES: No acute infarct. No acute intracranial hemorrhage. No mass effect or midline shift. No hydrocephalus. Similar left frontal lobe T2 hyperintensity, likely gliosis. Normal flow voids. No substantial change in approximately 15 x 12 mm enhancing lesion in the right frontal lobe and 17 mm lesion in the left cerebellum. No new enhancing lesions identified. Axial postcontrast imaging is limited by motion. The surrounding T2 FLAIR hyperintense edema is improved. ORBITS: No acute abnormality. SINUSES: No acute abnormality. BONES AND SOFT TISSUES: Normal bone marrow signal and enhancement. No acute soft tissue abnormality. IMPRESSION: 1. 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 Improved surrounding edema. 2. No new enhancing lesions identified. Electronically signed by: Gilmore Molt MD 05/18/2024 11:27 PM EDT RP Workstation: [id number]   Recent Labs    05/19/24 0519  WBC 6.5  HGB 14.5  HCT 43.1  PLT 164   Recent Labs    05/19/24 0519  NA 132*  K 5.1  CL 100  CO2 20*  GLUCOSE 110*  BUN 62*  CREATININE 1.26*  CALCIUM 9.3    Intake/Output Summary  (Last 24 hours) at 05/19/2024 0927 Last data filed at 05/19/2024 0838 Gross per 24 hour  Intake 360 ml  Output 800 ml  Net -440 ml        Physical Exam: Vital Signs Blood pressure 106/71, pulse 87, temperature 98.1 F (36.7 C), temperature source Oral, resp. rate 16, height 5' 8 (1.727 m), weight 91 kg, SpO2 96%.  Constitutional: No distress . Vital signs reviewed. HEENT: NCAT, EOMI, oral membranes moist Neck: supple Cardiovascular: RRR without murmur. No JVD    Respiratory/Chest: CTA Bilaterally without wheezes or rales. Normal effort    GI/Abdomen: BS +, non-tender, non-distended Ext: no clubbing, cyanosis, or edema Psych: pleasant and cooperative  Skin: C/D/I. No apparent lesions. + Craniotomy-staples remain intact + PIV c/d/i    MSK:      No apparent deformity.       Neurologic exam:  Cognition: AAO to person, place, time and event.  + Impulsive, poor awareness   Strength: Exam limited by pain with range of motion bilateral lower extremities                RUE: 5/5 SA, 5/5 EF, 5/5 EE, 5/5 WE, 5/5 FF, 5/5 FA                LUE:  5/5 SA, 5/5 EF, 5/5 EE, 5/5 WE, 5/5 FF, 5/5 FA  RLE: 4/5 HF, 5/5 KE, 5/5  DF, 5/5  EHL, 5/5  PF                 LLE:  4/5 HF, 5/5 KE, 5/5  DF, 5/5  EHL, 5/5  PF    Language: Fluent, No substitutions or neoglisms. No dysarthria.  .  Memory: Recalls 2/3 objects at 5 minutes.  Mild deficits.  Insight: Fair insight into current condition.  Sensation: sensitivity in BL LE to light touch Reflexes: 2+ in BL UE and 0 BL LEs. Negative Hoffman's and babinski signs bilaterally.   CN: L eye difficulty converging, otherwise intact--no change Coordination: No apparent tremors. No ataxia Spasticity: MAS 0 in all extremities.      Assessment/Plan: 1. Functional deficits which require 3+ hours per day of interdisciplinary therapy in a comprehensive inpatient rehab setting. Physiatrist is providing close team supervision and 24 hour  management of active medical problems listed below. Physiatrist and rehab team continue to assess barriers to discharge/monitor patient progress toward functional and medical goals  Care Tool:  Bathing    Body parts bathed by patient: Right arm, Left arm, Abdomen, Chest, Front perineal area, Right upper leg, Left upper leg, Right lower leg, Left lower leg, Face   Body parts bathed by helper: Buttocks     Bathing assist Assist Level: Moderate Assistance - Patient 50 - 74%     Upper Body Dressing/Undressing Upper body dressing   What is the patient wearing?: Pull over shirt    Upper body assist Assist Level: Contact Guard/Touching assist    Lower Body Dressing/Undressing Lower body dressing      What is the patient wearing?: Underwear/pull up, Pants     Lower body assist Assist for lower body dressing: Moderate Assistance - Patient 50 - 74%     Toileting Toileting    Toileting assist Assist for toileting: Moderate Assistance - Patient 50 - 74%     Transfers Chair/bed transfer  Transfers assist     Chair/bed transfer assist level: Moderate Assistance - Patient 50 - 74%     Locomotion Ambulation   Ambulation assist      Assist level: Moderate Assistance - Patient 50 - 74% Assistive device: No Device Max distance: 15'   Walk 10 feet activity   Assist     Assist level: Moderate Assistance - Patient - 50 - 74% Assistive device: No Device   Walk 50 feet activity   Assist Walk 50 feet with 2 turns activity did not occur: Safety/medical concerns         Walk 150 feet activity   Assist Walk 150 feet activity did not occur: Safety/medical concerns         Walk 10 feet on uneven surface  activity   Assist Walk 10 feet on uneven surfaces activity did not occur: Safety/medical concerns         Wheelchair     Assist Is the patient using a wheelchair?: Yes Type of Wheelchair: Manual    Wheelchair assist level: Dependent - Patient  0% Max wheelchair distance: 150'    Wheelchair 50 feet with 2 turns activity    Assist        Assist Level: Dependent - Patient 0%   Wheelchair 150 feet activity     Assist      Assist Level: Dependent - Patient 0%   Blood pressure 106/71, pulse 87, temperature 98.1 F (36.7 C), temperature source Oral, resp. rate 16, height 5' 8 (  1.727 m), weight 91 kg, SpO2 96%.  Medical Problem List and Plan: 1. 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  Antithrombotics: -DVT/anticoagulation:  Pharmaceutical: Lovenox             -antiplatelet therapy: N/A  3. Pain Management/spasms: percocet prn.   - 10-24: Extreme difficulty tolerating therapies due to back and lower hip pain and spasms with mobility.  baclofen to 10 mg 3 times daily, add tizanidine 2 mg 3 times daily, schedule OxyContin 10 mg every 12 hours, continue as needed Percocet 5 mg every 4 hours.  - 10-25: Increased OxyContin to 15 mg twice daily, increase baclofen to 15 mg 3 times daily; immediately notified next therapy session the patient was getting lethargic with these changes, so reverted to prior  - 10-26: Patient pain much improved on current regimen.  Unfortunately, appears to be somewhat sedated and euphoric on OxyContin 10 mg twice daily.  Not using as needed oxycodone, will instead schedule 5 mg 2 times daily before therapies in addition to as needed and DC long-acting.   10/27 perhaps a little more pain today but cognitively seemed more appropriate compared to what's recorded- no changes to regimen 4. Mood/Behavior/Sleep: LCSW to follow for evaluation and support             --continue ambien prn.              -antipsychotic agents: N/A  5. Neuropsych/cognition:  This patient may be intermittently  capable of making decisions on his own behalf. 6. Skin/Wound Care: Routine pressure relief measures.  - Postop day #11 craniotomy  will be 10-27 for staple removal   10/27 dc staples today 7. Fluids/Electrolytes/Nutrition: Monitor I/O.   8.  Hyperglycemia due to steroids for lung cancer w/brain mets: XRT started today and at end of day as able to help with tolerance of therapy             --on  Decadron 4 mg TID and fasting BS 120-150 range.  --Will check A1c in am--5.7, prediabetic -10/27 fair control. 159 is high--I will not treat- defer to primary team Recent Labs    05/18/24 0626 05/18/24 1132 05/19/24 0556  GLUCAP 139* 159* 114*      9. COPD: On Singulair with albuterol prn.  10. HTN: Monitor BP TID---on avapro and cardura.   - Monitor with therapies today  - 10-25: Increase Cardura for urinary retention to 3 mg nightly  - 10-26: Blood pressures trending low; reduce Avapro 300 to 150 mg daily.  May need to reduce Cardura again if BP does not tolerate.  10/27 first dose of 150mg  cardura today---hold given #13    05/19/2024    3:22 AM 05/18/2024    7:11 PM 05/18/2024    3:29 AM  Vitals with BMI  Systolic 106 93 94  Diastolic 71 65 59  Pulse 87 82 79    11. CAD: On Avapro and statin. Monitor for symptoms with increase in activity.              --continue to hold spironolactone and torsemide.    - weights stable  Filed Weights   05/15/24 1727 05/16/24 1544 05/17/24 0504  Weight: 91.1 kg 91.1 kg 91 kg    12.  Hyponatremia: Na 132 @ admission-->131.  --Recheck BMET in am--stable 10/27 stable at 132 13. Acute on chronic renal failure: Encourage fluid intake.  BUN/SCr 27/1.32-->61/1.38 -- Monitor potassium levels as baseline 4.9-5.0-->continue ARB for now.  - Recheck CMET in am--improving BUN 37, creatinine 1.14, potassium 4.5 -10/27 BUN/Cr back up to 62/1.6 today, k+ 5.1 - hold avapro for now, push fluids and recheck in AM 14.  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  - Baclofen 10 mg 3 times daily  - Tizanidine 2 mg 3 times daily  - 10-25: Start dantrolene 25 mg daily, after 1 week test LFTs and increase to 3 times daily if tolerated.  Add clonazepam 0.5 mg 3 times daily as needed for myoclonus.  May need to add VPA if myoclonus worsens.  10-26/27: Spasms much better controlled, has not needed to use clonazepam yet.  Trend.  15.  Neurogenic bladder/urinary retention.  Likely secondary to cauda equina involvement, flaccid bladder.  - Ongoing requirement of intermittent straight cathing; increase Cardura to 3 mg nightly  - 10-26: Added Urecholine 10 mg 3 times daily--if no response with up titration/intolerable side effects, would transition towards patient learning intermittent straight cathing   -10/27 cathed for 800cc this am.    -urecholine just added, consider increase in AM to 25mg  if still retaining   -encouraged up to [facility] or toilet to empty LOS: 4 days A FACE TO FACE EVALUATION WAS PERFORMED  Roger Black 05/19/2024, 9:27 AM

## 2024-05-19 NOTE — Progress Notes (Signed)
 Speech Language Pathology Daily Session Note  Patient Details  Name: Roger Black MRN: [id number] Date of Birth: 06/16/1956  Today's Date: 05/19/2024 SLP Individual Time: 8569-8476 SLP Individual Time Calculation (min): 53 min and Today's Date: 05/19/2024 SLP Missed Time: 7 Minutes Missed Time Reason: Patient fatigue  Short Term Goals: Week 1: SLP Short Term Goal 1 (Week 1): Pt will recall recent/relevant information w/ minA SLP Short Term Goal 2 (Week 1): Pt will solve mildly complex problems w/ minA  Skilled Therapeutic Interventions:   Pt greeted at bedside for tx targeting cognition. He was pleasant and agreeable to tx tasks, though visibly fatigued. During initial conversation, he was able to recall detailed events of the day thus far w/ supervisionA and events of the weekend w/ minA. He did require modA for problem solving during phone navigation task. He then received a (presumed) spam call, and required mod-maxA to ID the problem and reason a potential solution for it. Fatigue became more evident and he benefited from additional time and modA for information processing during verbal problem solving task re common complications of radiation and overall POC. He was then unable to remain awake for tx tasks. Remaining 7 mins missed and he was left in his bed w/ the alarm set/call light within reach. Recommend cont ST per POC.   Pain Pain Assessment Pain Scale: 0-10 Pain Score: 6  Pain Location: Back Pain Intervention(s): Medication (See eMAR)  Therapy/Group: Individual Therapy  [name] 05/19/2024, 3:34 PM

## 2024-05-20 ENCOUNTER — Ambulatory Visit: Admitting: Radiation Oncology

## 2024-05-20 ENCOUNTER — Other Ambulatory Visit: Payer: Self-pay

## 2024-05-20 ENCOUNTER — Ambulatory Visit

## 2024-05-20 ENCOUNTER — Encounter: Admitting: Neurosurgery

## 2024-05-20 DIAGNOSIS — Z510 Encounter for antineoplastic radiation therapy: Secondary | ICD-10-CM | POA: Diagnosis not present

## 2024-05-20 LAB — BASIC METABOLIC PANEL WITH GFR
Anion gap: 9 (ref 5–15)
BUN: 47 mg/dL — ABNORMAL HIGH (ref 8–23)
CO2: 20 mmol/L — ABNORMAL LOW (ref 22–32)
Calcium: 8.8 mg/dL — ABNORMAL LOW (ref 8.9–10.3)
Chloride: 103 mmol/L (ref 98–111)
Creatinine, Ser: 1.09 mg/dL (ref 0.61–1.24)
GFR, Estimated: >60 mL/min (ref >60)
Glucose, Bld: 119 mg/dL — ABNORMAL HIGH (ref 70–99)
Potassium: 4.9 mmol/L (ref 3.5–5.1)
Sodium: 132 mmol/L — ABNORMAL LOW (ref 135–145)

## 2024-05-20 LAB — RAD ONC ARIA SESSION SUMMARY
Course Elapsed Days: 5
Plan Fractions Treated to Date: 4
Plan Prescribed Dose Per Fraction: 3 Gy
Plan Total Fractions Prescribed: 10
Plan Total Prescribed Dose: 30 Gy
Reference Point Dosage Given to Date: 12 Gy
Reference Point Session Dosage Given: 3 Gy
Session Number: 4

## 2024-05-20 LAB — GLUCOSE, CAPILLARY: Glucose-Capillary: 151 mg/dL — ABNORMAL HIGH (ref 70–99)

## 2024-05-20 MED ORDER — LEVETIRACETAM 500 MG PO TABS
500.0000 mg | ORAL_TABLET | Freq: Two times a day (BID) | ORAL | Status: DC
  Administered 2024-05-20 – 2024-06-03 (×29): 500 mg via ORAL
  Filled 2024-05-20 (×8): qty 1
  Filled 2024-05-20: qty 2
  Filled 2024-05-20 (×20): qty 1
  Filled 2024-05-20: qty 2

## 2024-05-20 MED ORDER — BETHANECHOL CHLORIDE 10 MG PO TABS
25.0000 mg | ORAL_TABLET | Freq: Three times a day (TID) | ORAL | Status: DC
  Administered 2024-05-21 – 2024-05-23 (×7): 25 mg via ORAL
  Filled 2024-05-20 (×10): qty 3

## 2024-05-20 MED ORDER — SORBITOL 70 % SOLN
30.0000 mL | Freq: Once | Status: AC
  Administered 2024-05-20: 30 mL via ORAL
  Filled 2024-05-20: qty 30

## 2024-05-20 MED ORDER — SORBITOL 70 % SOLN
30.0000 mL | Freq: Every day | Status: DC | PRN
  Administered 2024-05-29: 30 mL via ORAL
  Filled 2024-05-20: qty 30

## 2024-05-20 NOTE — Plan of Care (Signed)
  Problem: Consults Goal: RH BRAIN INJURY PATIENT EDUCATION Description: Description: See Patient Education module for eduction specifics Outcome: Progressing   Problem: RH BOWEL ELIMINATION Goal: RH STG MANAGE BOWEL WITH ASSISTANCE Description: STG Manage Bowel with supervision Assistance. Outcome: Progressing   Problem: RH BLADDER ELIMINATION Goal: RH STG MANAGE BLADDER WITH ASSISTANCE Description: STG Manage Bladder With supervision Assistance Outcome: Progressing   Problem: RH SKIN INTEGRITY Goal: RH STG SKIN FREE OF INFECTION/BREAKDOWN Description: Manage skin free of infection with supervision assistance Outcome: Progressing   Problem: RH SAFETY Goal: RH STG ADHERE TO SAFETY PRECAUTIONS W/ASSISTANCE/DEVICE Description: STG Adhere to Safety Precautions With supervision Assistance/Device. Outcome: Progressing   Problem: RH PAIN MANAGEMENT Goal: RH STG PAIN MANAGED AT OR BELOW PT'S PAIN GOAL Description: <4 w/ prns Outcome: Progressing   Problem: RH KNOWLEDGE DEFICIT BRAIN INJURY Goal: RH STG INCREASE KNOWLEDGE OF SELF CARE AFTER BRAIN INJURY Description: Manage increase knowledge of self care after brain injury with supervision assistance from wife using educational materials provided Outcome: Progressing   Problem: Education: Goal: Knowledge of General Education information will improve Description: Including pain rating scale, medication(s)/side effects and non-pharmacologic comfort measures Outcome: Progressing   Problem: Health Behavior/Discharge Planning: Goal: Ability to manage health-related needs will improve Outcome: Progressing   Problem: Clinical Measurements: Goal: Ability to maintain clinical measurements within normal limits will improve Outcome: Progressing Goal: Will remain free from infection Outcome: Progressing Goal: Diagnostic test results will improve Outcome: Progressing Goal: Respiratory complications will improve Outcome:  Progressing Goal: Cardiovascular complication will be avoided Outcome: Progressing   Problem: Activity: Goal: Risk for activity intolerance will decrease Outcome: Progressing   Problem: Nutrition: Goal: Adequate nutrition will be maintained Outcome: Progressing   Problem: Coping: Goal: Level of anxiety will decrease Outcome: Progressing   Problem: Elimination: Goal: Will not experience complications related to bowel motility Outcome: Progressing Goal: Will not experience complications related to urinary retention Outcome: Progressing   Problem: Pain Managment: Goal: General experience of comfort will improve and/or be controlled Outcome: Progressing   Problem: Safety: Goal: Ability to remain free from injury will improve Outcome: Progressing   Problem: Skin Integrity: Goal: Risk for impaired skin integrity will decrease Outcome: Progressing

## 2024-05-20 NOTE — Progress Notes (Signed)
 Occupational Therapy Note  Patient Details  Name: Roger Black MRN: [id number] Date of Birth: [birth year]  Occupational Therapist participated in the interdisciplinary team conference, providing clinical information regarding the patient's current status, treatment goals, and weekly focus, including any barriers that need to be addressed. Please see the Inpatient Rehabilitation Team Conference and Plan of Care Update for further details.  [name] 05/20/2024, 12:07 PM

## 2024-05-20 NOTE — Progress Notes (Signed)
 Physical Therapy Session Note  Patient Details  Name: Roger Black MRN: [id number] Date of Birth: [birth year]  Today's Date: 05/20/2024 PT Individual Time: 1454-1535 PT Individual Time Calculation (min): 41 min   Short Term Goals: Week 1:  PT Short Term Goal 1 (Week 1): Pt will complete bed mobility with minA consistently. PT Short Term Goal 2 (Week 1): Pt will complete sit to stand with minA consistently. PT Short Term Goal 3 (Week 1): Pt will complete bed to chair transfer with minA consistently. PT Short Term Goal 4 (Week 1): Pt will ambulate x100' with minA and LRAD.  Skilled Therapeutic Interventions/Progress Updates:     Pt received supine in bed. Initially refuses therapy and requests to rest for a few minutes prior to mobility. PT returns and pt is agreeable to therapy. Reports pain in both hips, but much improved from previous session with this therapist. Pt performs supine to sit from elevated head of bed with modA for Rt HHA and cues trunk position and sequencing. Pt requires extended time seated at EOB to acclimate to upright positioning. Pt performs sit to stand with minA and cues for hand placement. Pt ambulates x100' with RW and minA, with cues to decrease WB through RW for energy conservation and improved body mechanics. Extended seated rest break.   Pt stands from mat with minA and cues for hand placement. Several seconds after standing, pt buckles through both legs and drops abruptly back to the mat. Pt's buttocks is several inches from edge of mat, with PT assistance to guide hips backward, preventing fall to ground. Pt stands again and again has slight buckling through legs, though not with full LOB. Pt attempts to make jokes to other people in gym and PT provides cues to focus on mobility for safety. Pt ambulates x15' to Nustep with minA and no additional buckling. Pt completes Nustep to increase available ROM in BLEs, as well as providing endurance training.   Pt  completes x10:00 at workload of 5 with average steps per minute ~12. PT provides cues for hand and foot placement and completing full available ROM.   Pt performs stand step from Nustep>WC>bed with minA/modA and cues for sequencing and positioning. Left supine with all needs within reach.  Therapy Documentation Precautions:  Precautions Precautions: Fall Precaution/Restrictions Comments: BP <150 Restrictions Weight Bearing Restrictions Per Provider Order: No   Therapy/Group: Individual Therapy  Elsie JAYSON Dawn, PT, DPT 05/20/2024, 3:49 PM

## 2024-05-20 NOTE — Progress Notes (Signed)
 PROGRESS NOTE   Subjective/Complaints:  No events overnight.  Continues to get spasms/charley horses in his bilateral hips.  Otherwise, no complaints.  Vitals stable      05/20/2024    4:56 AM 05/19/2024   11:00 PM 05/19/2024   10:46 PM  Vitals with BMI  Systolic 118 117 892  Diastolic 81 72 70  Pulse 77      Recent Labs    05/18/24 1132 05/18/24 1232 05/19/24 0556  GLUCAP 159* 151* 114*     P.o. intakes appropriate   Continues to require high volume caths every few hours; thanks today he started feeling the sensation of needing to void, but was unable to complete.  Last BM 10-27: Large.  Patient states, despite this, he feels that he is significantly constipated and wants additional medications to treat it.  Feels this is contributing to difficulty urinating.  ROS:  Positive for constipation, urinary retention, and bilateral hip pain/spasms   Objective:   MR BRAIN W WO CONTRAST Result Date: 05/18/2024 EXAM: MRI BRAIN WITH AND WITHOUT CONTRAST 05/18/2024 04:45:00 PM TECHNIQUE: Multiplanar multisequence MRI of the head/brain was performed with and without the administration of intravenous contrast. COMPARISON: MRI May 05, 2024 CLINICAL HISTORY: Metastatic disease evaluation; 3T SRS Protocol for radiation treatment planning. FINDINGS: BRAIN AND VENTRICLES: No acute infarct. No acute intracranial hemorrhage. No mass effect or midline shift. No hydrocephalus. Similar left frontal lobe T2 hyperintensity, likely gliosis. Normal flow voids. No substantial change in approximately 15 x 12 mm enhancing lesion in the right frontal lobe and 17 mm lesion in the left cerebellum. No new enhancing lesions identified. Axial postcontrast imaging is limited by motion. The surrounding T2 FLAIR hyperintense edema is improved. ORBITS: No acute abnormality. SINUSES: No acute abnormality. BONES AND SOFT TISSUES: Normal bone marrow signal and  enhancement. No acute soft tissue abnormality. IMPRESSION: 1. 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 Improved surrounding edema. 2. No new enhancing lesions identified. Electronically signed by: Gilmore Molt MD 05/18/2024 11:27 PM EDT RP Workstation: [id number]   Recent Labs    05/19/24 0519  WBC 6.5  HGB 14.5  HCT 43.1  PLT 164   Recent Labs    05/19/24 0519 05/20/24 0539  NA 132* 132*  K 5.1 4.9  CL 100 103  CO2 20* 20*  GLUCOSE 110* 119*  BUN 62* 47*  CREATININE 1.26* 1.09  CALCIUM 9.3 8.8*    Intake/Output Summary (Last 24 hours) at 05/20/2024 0959 Last data filed at 05/20/2024 0824 Gross per 24 hour  Intake 1584.84 ml  Output 2700 ml  Net -1115.16 ml        Physical Exam: Vital Signs Blood pressure 118/81, pulse 77, temperature 98.3 F (36.8 C), temperature source Oral, resp. rate 19, height 5' 8 (1.727 m), weight 91 kg, SpO2 96%.  Constitutional: No distress . Vital signs reviewed.  Laying in bed. HEENT: NCAT, EOMI, oral membranes moist Neck: supple Cardiovascular: RRR without murmur. No JVD    Respiratory/Chest: CTA Bilaterally without wheezes or rales. Normal effort    GI/Abdomen: BS +, non-tender, non-distended Ext: no clubbing, cyanosis, or edema Psych: pleasant and cooperative  Skin: C/D/I. No apparent lesions. + Craniotomy-staples removed, well-approximated + PIV c/d/i  MSK:      No apparent deformity.       Neurologic exam:  Cognition: AAO to person, place, time and event.  + Impulsive, poor awareness   Strength: Grossly 5 out of 5 throughout, complicated by pain/spasms with lower extremity hip flexion   Language: Fluent, No substitutions or neoglisms. No dysarthria.  .  Memory: Recalls 2/3 objects at 5 minutes.  Mild deficits.  Insight: Fair insight into current condition.  Sensation: sensitivity in BL LE to light touch Reflexes: 2+ in BL UE and 0 BL LEs. Negative Hoffman's and babinski  signs bilaterally.   CN: L eye difficulty converging, otherwise intact--no change Coordination:  Bilateral upper extremity loss of tone's and hands with intention, not present at rest.  Spasticity: MAS 0 in all extremities.      Assessment/Plan: 1. Functional deficits which require 3+ hours per day of interdisciplinary therapy in a comprehensive inpatient rehab setting. Physiatrist is providing close team supervision and 24 hour management of active medical problems listed below. Physiatrist and rehab team continue to assess barriers to discharge/monitor patient progress toward functional and medical goals  Care Tool:  Bathing    Body parts bathed by patient: Right arm, Left arm, Abdomen, Chest, Front perineal area, Right upper leg, Left upper leg, Right lower leg, Left lower leg, Face   Body parts bathed by helper: Buttocks     Bathing assist Assist Level: Moderate Assistance - Patient 50 - 74%     Upper Body Dressing/Undressing Upper body dressing   What is the patient wearing?: Pull over shirt    Upper body assist Assist Level: Contact Guard/Touching assist    Lower Body Dressing/Undressing Lower body dressing      What is the patient wearing?: Underwear/pull up, Pants     Lower body assist Assist for lower body dressing: Moderate Assistance - Patient 50 - 74%     Toileting Toileting    Toileting assist Assist for toileting: Moderate Assistance - Patient 50 - 74%     Transfers Chair/bed transfer  Transfers assist     Chair/bed transfer assist level: Moderate Assistance - Patient 50 - 74%     Locomotion Ambulation   Ambulation assist      Assist level: Moderate Assistance - Patient 50 - 74% Assistive device: No Device Max distance: 15'   Walk 10 feet activity   Assist     Assist level: Moderate Assistance - Patient - 50 - 74% Assistive device: No Device   Walk 50 feet activity   Assist Walk 50 feet with 2 turns activity did not occur:  Safety/medical concerns         Walk 150 feet activity   Assist Walk 150 feet activity did not occur: Safety/medical concerns         Walk 10 feet on uneven surface  activity   Assist Walk 10 feet on uneven surfaces activity did not occur: Safety/medical concerns         Wheelchair     Assist Is the patient using a wheelchair?: Yes Type of Wheelchair: Manual    Wheelchair assist level: Dependent - Patient 0% Max wheelchair distance: 150'    Wheelchair 50 feet with 2 turns activity    Assist        Assist Level: Dependent - Patient 0%   Wheelchair 150 feet activity     Assist      Assist  Level: Dependent - Patient 0%   Blood pressure 118/81, pulse 77, temperature 98.3 F (36.8 C), temperature source Oral, resp. rate 19, height 5' 8 (1.727 m), weight 91 kg, SpO2 96%.  Medical Problem List and Plan: 1. 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 Daughter will be moving in with them. Myoclonus in [place] remains challenging; Min A ADLs overall min-mod for transfers. Amb 15 ft with RW limited by pain. Mild-moderate cognitive deficits variable depending on XRT.   2.  Antithrombotics: -DVT/anticoagulation:  Pharmaceutical: Lovenox             -antiplatelet therapy: N/A  3. Pain Management/spasms: percocet prn.   - 10-24: Extreme difficulty tolerating therapies due to back and lower hip pain and spasms with mobility.  baclofen to 10 mg 3 times daily, add tizanidine 2 mg 3 times daily, schedule OxyContin 10 mg every 12 hours, continue as needed Percocet 5 mg every 4 hours.  - 10-25: Increased OxyContin to 15 mg twice daily,  increase baclofen to 15 mg 3 times daily; immediately notified next therapy session the patient was getting lethargic with these changes, so reverted to prior  - 10-26: Patient pain much improved on current regimen.  Unfortunately, appears to be somewhat sedated and euphoric on OxyContin 10 mg twice daily.  Not using as needed oxycodone, will instead schedule 5 mg 2 times daily before therapies in addition to as needed and DC long-acting.   - 10/27: perhaps a little more pain today but cognitively seemed more appropriate compared to what's recorded- no changes to regimen  10-28: Pain under better control, continues with spasms but seems to be doing better overall.  4. Mood/Behavior/Sleep: LCSW to follow for evaluation and support             --continue ambien prn.              -antipsychotic agents: N/A  5. Neuropsych/cognition: This patient may be intermittently  capable of making decisions on his own behalf. 6. Skin/Wound Care: Routine pressure relief measures.  - Postop day #11 craniotomy  will be 10-27 for staple removal   10/27 dc staples today  7. Fluids/Electrolytes/Nutrition: Monitor I/O.   8.  Hyperglycemia due to steroids for lung cancer w/brain mets: XRT started today and at end of day as able to help with tolerance of therapy             --on  Decadron 4 mg TID and fasting BS 120-150 range.  --Will check A1c in am--5.7, prediabetic -10/27 fair control. 159 is high--I will not treat- defer to primary team Recent Labs    05/18/24 1132 05/18/24 1232 05/19/24 0556  GLUCAP 159* 151* 114*      9. COPD: On Singulair with albuterol prn.  10. HTN: Monitor BP TID---on avapro and cardura.   - Monitor with therapies today  - 10-25: Increase Cardura for urinary retention to 3 mg nightly  - 10-26: Blood pressures trending low; reduce Avapro 300 to 150 mg daily.  May need to reduce Cardura again if  BP does not tolerate.  10/27 hold Avapro--BP improved 10-28    05/20/2024    4:56 AM  05/19/2024   11:00 PM 05/19/2024   10:46 PM  Vitals with BMI  Systolic 118 117 892  Diastolic 81 72 70  Pulse 77      11. CAD: On Avapro and statin. Monitor for symptoms with increase in activity.              --continue to hold spironolactone and torsemide.    - weights stable Filed Weights   05/15/24 1727 05/16/24 1544 05/17/24 0504  Weight: 91.1 kg 91.1 kg 91 kg    12.  Hyponatremia: Na 132 @ admission-->131.  --Recheck BMET in am--stable 10/27 stable at 132  13. Acute on chronic renal failure: Encourage fluid intake.  BUN/SCr 27/1.32-->61/1.38 -- Monitor potassium levels as baseline 4.9-5.0-->continue ARB for now.  - Recheck CMET in am--improving BUN 37, creatinine 1.14, potassium 4.5 -10/27 BUN/Cr back up to 62/1.6 today, k+ 5.1 - hold avapro for now, push fluids and recheck in AM 10-28: BUN down to 47, creatinine normalized 1.09.  Continue to encourage p.o. fluids.  14.  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  - Baclofen 10 mg 3 times daily  - Tizanidine 2 mg 3 times daily  - 10-25: Start dantrolene 25 mg daily, after 1 week test LFTs and increase to 3 times daily if tolerated.  Add clonazepam 0.5 mg 3 times daily as needed for myoclonus.  May need to add VPA if myoclonus worsens.  10-26/27: Spasms much better controlled, has not needed to use clonazepam yet.  Trend.  10-28: Start low-dose Keppra 500 mg twice daily for upper extremity myoclonus.  15.  Neurogenic bladder/urinary retention.  Likely secondary to cauda equina involvement, flaccid bladder.  - Ongoing requirement of intermittent straight cathing; increase Cardura to 3 mg nightly  - 10-26: Added Urecholine 10 mg 3 times daily--if no response with up titration/intolerable side effects, would transition towards patient learning intermittent straight cathing   -10/27 cathed for 800cc this am.    -urecholine just added, consider increase in AM to 25mg  if still  retaining   -encouraged up to [facility] or toilet to empty  - 10-28: Increase frequency of cath to every 4 hours due to large volumes, increase Urecholine to 25 mg 3 times daily.  If no improvement with these measures, will need to drain patient on straight cathing versus place Foley based on upper extremity coordination and bladder volumes greater than 2.5 L/day.  LOS: 5 days A FACE TO FACE EVALUATION WAS PERFORMED  Joesph JAYSON Likes 05/20/2024, 9:59 AM

## 2024-05-20 NOTE — Patient Care Conference (Signed)
 Inpatient RehabilitationTeam Conference and Plan of Care Update Date: 05/20/2024   Time: 1014 am    Patient Name: Roger Black      Medical Record Number: [id number]  Date of Birth: 05/08/1956 Sex: Male         Room/Bed: 4W11C/4W11C-01 Payor Info: Payor: [employer] / Plan: [employer] PPO / Product Type: *No Product type* /    Admit Date/Time:  05/15/2024  5:07 PM  Primary Diagnosis:  Metastatic cancer to brain [facility])  Hospital Problems: Principal Problem:   Metastatic cancer to brain [facility])    Expected Discharge Date: Expected Discharge Date: 05/29/24  Team Members Present: Physician leading conference: Dr. Joesph Likes Social Worker Present: Graeme Jude, LCSW Nurse Present: Eulalio Falls, RN PT Present: Kirt Dawn, PT OT Present: Nena Moats, OT SLP Present: Recardo Mole, SLP PPS Coordinator present : Eleanor Colon, SLP     Current Status/Progress Goal Weekly Team Focus  Bowel/Bladder     In and out catheterization / continent of bowels    Regain continence of bladder and maintain continence of bowels    Assess bowel and bladder q shift  Swallow/Nutrition/ Hydration   regular/thin           ADL's   Min A UB ADLs and grooming d/t UE twitching, mod A LB ADLs, mod-max A toileting, mod A sit > stand and pivots. Limited by pain management   Supervision ADLs and transfers   ADls, transfers, d/c planning, cognitive retraining    Mobility   modA bed mobility, minA/modA transfers, modA gait 15' with RW, limited pain   Supervision  pain control, transfers, balance, ambulation, endurance    Communication                Safety/Cognition/ Behavioral Observations  mild cognitive deficits @ eval only minA for STM and problem solving, yesterday was modA anticipate this will continue to vary   supervision   pt/family education, cognitive retraining, introducing compensatory strategies    Pain     Pain on hips and legs     <4 w/  prns    Assess pain q shift  Skin      Staples d/c 10/27   Skin free of infection entire stay on rehab    Assess skin q shift    Discharge Planning:  Pt will d/c to home with his wife. Pt will have transportation through duration of radiation treatment while here; ends 115. SW will confirm there are no barriers to discharge.    Team Discussion: Patient admitted post R craniotomy secondary to brain metastasis. Patient with pain/spasms  on lower  hips/legs /confusion at night /constipation/diarrhea /low blood pressures: medication adjusted by MD. Patient with high volumes of urine retention: treatment adjusted by MD. Patient progress limited by fatigue, endurance , mild- mod cognitive deficits.  Patient on target to meet rehab goals: Currently patient needs min assistance with upper body care and mod assistance with lower body care. Patient needs min-mod assist with transfers . Patient was able to ambulate up to 60' with  mod assistance using a rolling walker. Patient with mild- mod cognitive deficits and anticipate to continue to vary. Overall goals at discharge are set for supervision assistance.   *See Care Plan and progress notes for long and short-term goals.   Revisions to Treatment Plan:  Radiation therapy In and out Catheterization Encourage fluid intake Supervision assistance with meals Timed toileting Chaplain Consult  Teaching Needs: Safety, medications, transfers, toileting, incision care, etc.  Current Barriers to Discharge: Decreased caregiver support, Home enviroment access/layout, Neurogenic bowel and bladder, and Radiation treatments  Possible Resolutions to Barriers: Family Education Home health follow -up DME: [facility]/ [facility]     Medical Summary Current Status: Medically complicated by metastatic cancer to the brain and spine undergoing active XRT, cauda equina; flaccid bladder/urinary retention, poor pain control, spasticity, AKI, hyper/hypotension  Barriers to  Discharge: Behavior/Mood;Hypotension;Incontinence;Neurogenic Bowel & Bladder;Medical stability;Uncontrolled Diabetes;Self-care education;Renal Insufficiency/Failure;Uncontrolled Pain   Possible Resolutions to [employer]: Titrate pain and spasticity medications to least sedating regimen for function, continue XRT treatments, timed toileting and intermittent straight cathing for flaccid bladder, titrate blood pressure medications   Continued Need for Acute Rehabilitation Level of Care: The patient requires daily medical management by a physician with specialized training in physical medicine and rehabilitation for the following reasons: Direction of a multidisciplinary physical rehabilitation program to maximize functional independence : Yes Medical management of patient stability for increased activity during participation in an intensive rehabilitation regime.: Yes Analysis of laboratory values and/or radiology reports with any subsequent need for medication adjustment and/or medical intervention. : Yes   I attest that I was present, lead the team conference, and concur with the assessment and plan of the team.   Doretha Goding Gayo 05/20/2024, 1014 am

## 2024-05-20 NOTE — Progress Notes (Signed)
 Occupational Therapy Session Note  Patient Details  Name: Roger Black MRN: [id number] Date of Birth: 07/06/1956  Today's Date: 05/20/2024 OT Individual Time: 8696-8581 OT Individual Time Calculation (min): 75 min    Short Term Goals: Week 1:  OT Short Term Goal 1 (Week 1): Pt will use LRAD and don pants with min A OT Short Term Goal 2 (Week 1): Pt will complete stand pivot to the [facility] with min A OT Short Term Goal 3 (Week 1): Pt will complete toileting tasks with min A OT Short Term Goal 4 (Week 1): Pt will tolerate sitting up in the w/c for 2 hours to demo improved activity tolerance  Skilled Therapeutic Interventions/Progress Updates:    Pt received supine with c/o moderate pain in his back and legs. He was very tearful and emotional re fatigue, radiation, and cancer in general. Provided extensive emotional support. Provided therapeutic listening for pt to process through emotions associated with dx. Provided gentle guidance for establishing goals of OT and moving forward what was most important to him to work on. Discussed bladder management, home transfers, family support and d/c planning at length. Discussed IADL engagement at length. He reported being confused re prognosis of cancer and treatment. Assisted pt in accessing MyChart to review his notes from medical team re his medical care. Created an adaptive arm rest to reduce UE jerking interfering with him using his computer and to improve self efficacy and health literacy. This helped pt and he was able to access his computer more independently. NT entered to inform pt on needing to be cath-ed. He was very upset. Was able to provide edu/convince him to try and void on [facility] He came to EOB with mod A using bed rail. Mod A sit > stand and then too pivot to the [facility]. He had small BM void, no urine. Provided total A peri hygiene. He returned to supine in bed for bladder scan. NT present.   Therapy Documentation Precautions:   Precautions Precautions: Fall Precaution/Restrictions Comments: BP <150 Restrictions Weight Bearing Restrictions Per Provider Order: No  Therapy/Group: Individual Therapy  [name] 05/20/2024, 10:26 AM

## 2024-05-20 NOTE — Plan of Care (Signed)
 Problem: Consults Goal: RH BRAIN INJURY PATIENT EDUCATION Description: Description: See Patient Education module for eduction specifics 05/20/2024 0421 by Amaiyah Nordhoff K, RN Outcome: Progressing 05/20/2024 0323 by Lindalou Dewitte POUR, RN Outcome: Progressing   Problem: RH BOWEL ELIMINATION Goal: RH STG MANAGE BOWEL WITH ASSISTANCE Description: STG Manage Bowel with supervision Assistance. 05/20/2024 0421 by Lindalou Dewitte POUR, RN Outcome: Progressing 05/20/2024 0323 by Lindalou Dewitte POUR, RN Outcome: Progressing   Problem: RH BLADDER ELIMINATION Goal: RH STG MANAGE BLADDER WITH ASSISTANCE Description: STG Manage Bladder With supervision Assistance 05/20/2024 0421 by Tuwanna Krausz K, RN Outcome: Progressing 05/20/2024 0323 by Lindalou Dewitte POUR, RN Outcome: Progressing   Problem: RH SKIN INTEGRITY Goal: RH STG SKIN FREE OF INFECTION/BREAKDOWN Description: Manage skin free of infection with supervision assistance 05/20/2024 0421 by Neeraj Housand K, RN Outcome: Progressing 05/20/2024 0323 by Hanson Medeiros K, RN Outcome: Progressing   Problem: RH SAFETY Goal: RH STG ADHERE TO SAFETY PRECAUTIONS W/ASSISTANCE/DEVICE Description: STG Adhere to Safety Precautions With supervision Assistance/Device. 05/20/2024 0421 by Lindalou Dewitte POUR, RN Outcome: Progressing 05/20/2024 0323 by Lindalou Dewitte POUR, RN Outcome: Progressing   Problem: RH PAIN MANAGEMENT Goal: RH STG PAIN MANAGED AT OR BELOW PT'S PAIN GOAL Description: <4 w/ prns 05/20/2024 0421 by Lindalou Dewitte POUR, RN Outcome: Progressing 05/20/2024 0323 by Lindalou Dewitte POUR, RN Outcome: Progressing   Problem: RH KNOWLEDGE DEFICIT BRAIN INJURY Goal: RH STG INCREASE KNOWLEDGE OF SELF CARE AFTER BRAIN INJURY Description: Manage increase knowledge of self care after brain injury with supervision assistance from wife using educational materials provided 05/20/2024 0421 by [place] Luppino K, RN Outcome:  Progressing 05/20/2024 0323 by Lindalou Dewitte POUR, RN Outcome: Progressing   Problem: Education: Goal: Knowledge of General Education information will improve Description: Including pain rating scale, medication(s)/side effects and non-pharmacologic comfort measures 05/20/2024 0421 by Talton Delpriore K, RN Outcome: Progressing 05/20/2024 0323 by Lindalou Dewitte POUR, RN Outcome: Progressing   Problem: Health Behavior/Discharge Planning: Goal: Ability to manage health-related needs will improve 05/20/2024 0421 by Lindalou Dewitte POUR, RN Outcome: Progressing 05/20/2024 0323 by Lindalou Dewitte POUR, RN Outcome: Progressing   Problem: Clinical Measurements: Goal: Ability to maintain clinical measurements within normal limits will improve 05/20/2024 0421 by Lindalou Dewitte POUR, RN Outcome: Progressing 05/20/2024 0323 by Lindalou Dewitte POUR, RN Outcome: Progressing Goal: Will remain free from infection 05/20/2024 0421 by Lindalou Dewitte POUR, RN Outcome: Progressing 05/20/2024 0323 by Lindalou Dewitte POUR, RN Outcome: Progressing Goal: Diagnostic test results will improve 05/20/2024 0421 by Lindalou Dewitte POUR, RN Outcome: Progressing 05/20/2024 0323 by Lindalou Dewitte POUR, RN Outcome: Progressing Goal: Respiratory complications will improve 05/20/2024 0421 by Lindalou Dewitte POUR, RN Outcome: Progressing 05/20/2024 0323 by Lindalou Dewitte POUR, RN Outcome: Progressing Goal: Cardiovascular complication will be avoided 05/20/2024 0421 by Reiko Vinje K, RN Outcome: Progressing 05/20/2024 0323 by Britain Saber K, RN Outcome: Progressing   Problem: Activity: Goal: Risk for activity intolerance will decrease 05/20/2024 0421 by Donta Mcinroy K, RN Outcome: Progressing 05/20/2024 0323 by Lindalou Dewitte POUR, RN Outcome: Progressing   Problem: Nutrition: Goal: Adequate nutrition will be maintained 05/20/2024 0421 by Cataleah Stites K, RN Outcome: Progressing 05/20/2024 0323 by Lindalou Dewitte POUR,  RN Outcome: Progressing   Problem: Coping: Goal: Level of anxiety will decrease 05/20/2024 0421 by Lindalou Dewitte POUR, RN Outcome: Progressing 05/20/2024 0323 by Lindalou Dewitte POUR, RN Outcome: Progressing   Problem: Elimination: Goal: Will not experience complications related to bowel motility 05/20/2024 0421 by Izel Hochberg K, RN Outcome: Progressing 05/20/2024 0323 by  Royer Cristobal K, RN Outcome: Progressing Goal: Will not experience complications related to urinary retention 05/20/2024 0421 by Lindalou Dewitte POUR, RN Outcome: Progressing 05/20/2024 0323 by Lindalou Dewitte POUR, RN Outcome: Progressing   Problem: Pain Managment: Goal: General experience of comfort will improve and/or be controlled 05/20/2024 0421 by Rally Ouch K, RN Outcome: Progressing 05/20/2024 0323 by Lindalou Dewitte POUR, RN Outcome: Progressing   Problem: Safety: Goal: Ability to remain free from injury will improve 05/20/2024 0421 by Lindalou Dewitte POUR, RN Outcome: Progressing 05/20/2024 0323 by Lindalou Dewitte POUR, RN Outcome: Progressing   Problem: Skin Integrity: Goal: Risk for impaired skin integrity will decrease 05/20/2024 0421 by Lindalou Dewitte POUR, RN Outcome: Progressing 05/20/2024 0323 by Iasia Forcier K, RN Outcome: Progressing

## 2024-05-20 NOTE — Congregational Nurse Program (Signed)
 Pt was crying due to being overwhelmed with his condition. Refused straight cath at this point, education provided.

## 2024-05-20 NOTE — Progress Notes (Signed)
 Patient ID: Roger Black, male   DOB: 04-18-1956, 68 y.o.   MRN: [id number]  SW met with pt and pt wife in room to provide updates from team conference, d/c date 11/6, HHA preference, and family edu. She will confirm fam edu with SW and speak with her dtr since she has now moved into their home. Dtr works 3rd shift 12am-6am.  SW will confirm no HHA preference.   Graeme Jude, MSW, LCSW Office: [phone] Cell: [phone] Fax: [phone]

## 2024-05-20 NOTE — Progress Notes (Signed)
 Physical Therapy Note  Patient Details  Name: Roger Black MRN: [id number] Date of Birth: [birth year] Today's Date: 05/20/2024   Physical Therapist participated in the interdisciplinary team conference, providing clinical information regarding the patient's current status, treatment goals, and weekly focus, including any barriers that need to be addressed. Please see the Inpatient Rehabilitation Team Conference and Plan of Care Update for further details.    Elsie JAYSON Dawn, PT, DPT 05/20/2024, 10:14 AM

## 2024-05-20 NOTE — Progress Notes (Signed)
 Per MD's order: 13 staples removed. Site looks healed, no signs of infection observed: no bleeding, pus or purulent. Site clean and dry. Patient denied any pain or discomfort.

## 2024-05-20 NOTE — Progress Notes (Signed)
 Speech Language Pathology Daily Session Note  Patient Details  Name: Roger Black MRN: [id number] Date of Birth: 06-25-1956  Today's Date: 05/20/2024 SLP Individual Time: 1110-1200 SLP Individual Time Calculation (min): 50 min  Short Term Goals: Week 1: SLP Short Term Goal 1 (Week 1): Pt will recall recent/relevant information w/ minA SLP Short Term Goal 2 (Week 1): Pt will solve mildly complex problems w/ minA  Skilled Therapeutic Interventions: Skilled therapy session focused on cognitive goals. SLP facilitated session by prompting patient to share biographical information re: self, medical hx and job. Patient required minA to demonstrate adequate thought processing to share information regarding family dynamics and sequencing of health information. Patient required modA to share reason for appointment this AM (radiation). SLP then targeted simple problem solving through visual scenarios. Patient recalled problem/solution in picture cards with minA. Of note, observed occasional instances of word finding difficulty throughout todays session. Continue to assess. Patient left in bed with alarm set and call bell in reach. Continue POC  Pain None reported   Therapy/Group: Individual Therapy  Rudell Marlowe M.A., CCC-SLP 05/20/2024, 7:37 AM

## 2024-05-21 ENCOUNTER — Ambulatory Visit

## 2024-05-21 ENCOUNTER — Other Ambulatory Visit: Payer: Self-pay

## 2024-05-21 ENCOUNTER — Inpatient Hospital Stay (HOSPITAL_COMMUNITY)

## 2024-05-21 ENCOUNTER — Ambulatory Visit: Admitting: Radiation Oncology

## 2024-05-21 DIAGNOSIS — Z510 Encounter for antineoplastic radiation therapy: Secondary | ICD-10-CM | POA: Diagnosis not present

## 2024-05-21 LAB — RAD ONC ARIA SESSION SUMMARY
Course Elapsed Days: 6
Plan Fractions Treated to Date: 1
Plan Fractions Treated to Date: 5
Plan Prescribed Dose Per Fraction: 3 Gy
Plan Prescribed Dose Per Fraction: 9 Gy
Plan Total Fractions Prescribed: 10
Plan Total Fractions Prescribed: 3
Plan Total Prescribed Dose: 27 Gy
Plan Total Prescribed Dose: 30 Gy
Reference Point Dosage Given to Date: 15 Gy
Reference Point Dosage Given to Date: 9 Gy
Reference Point Session Dosage Given: 3 Gy
Reference Point Session Dosage Given: 9 Gy
Session Number: 5

## 2024-05-21 LAB — GLUCOSE, CAPILLARY
Glucose-Capillary: 110 mg/dL — ABNORMAL HIGH (ref 70–99)
Glucose-Capillary: 111 mg/dL — ABNORMAL HIGH (ref 70–99)

## 2024-05-21 MED ORDER — TRAZODONE HCL 50 MG PO TABS
50.0000 mg | ORAL_TABLET | Freq: Every evening | ORAL | Status: DC | PRN
  Administered 2024-05-25 – 2024-05-26 (×2): 50 mg via ORAL
  Filled 2024-05-21 (×3): qty 1

## 2024-05-21 MED ORDER — ZOLPIDEM TARTRATE 5 MG PO TABS
5.0000 mg | ORAL_TABLET | Freq: Every day | ORAL | Status: DC
Start: 2024-05-21 — End: 2024-05-25
  Administered 2024-05-22: 5 mg via ORAL
  Filled 2024-05-21 (×4): qty 1

## 2024-05-21 MED ORDER — CALCIUM CARBONATE ANTACID 500 MG PO CHEW
1.0000 | CHEWABLE_TABLET | Freq: Three times a day (TID) | ORAL | Status: DC | PRN
  Administered 2024-05-28 – 2024-06-02 (×7): 200 mg via ORAL
  Filled 2024-05-21 (×7): qty 1

## 2024-05-21 MED ORDER — POLYETHYLENE GLYCOL 3350 17 G PO PACK: 17.0000 g | PACK | Freq: Every day | ORAL | Status: DC | PRN

## 2024-05-21 MED ORDER — SENNOSIDES-DOCUSATE SODIUM 8.6-50 MG PO TABS: 1.0000 | ORAL_TABLET | Freq: Every day | ORAL | Status: DC

## 2024-05-21 NOTE — Plan of Care (Signed)
  Problem: Consults Goal: RH BRAIN INJURY PATIENT EDUCATION Description: Description: See Patient Education module for eduction specifics Outcome: Progressing   Problem: RH BOWEL ELIMINATION Goal: RH STG MANAGE BOWEL WITH ASSISTANCE Description: STG Manage Bowel with supervision Assistance. Outcome: Progressing   Problem: RH BLADDER ELIMINATION Goal: RH STG MANAGE BLADDER WITH ASSISTANCE Description: STG Manage Bladder With supervision Assistance Outcome: Progressing   Problem: RH SKIN INTEGRITY Goal: RH STG SKIN FREE OF INFECTION/BREAKDOWN Description: Manage skin free of infection with supervision assistance Outcome: Progressing   Problem: RH SAFETY Goal: RH STG ADHERE TO SAFETY PRECAUTIONS W/ASSISTANCE/DEVICE Description: STG Adhere to Safety Precautions With supervision Assistance/Device. Outcome: Progressing   Problem: RH PAIN MANAGEMENT Goal: RH STG PAIN MANAGED AT OR BELOW PT'S PAIN GOAL Description: <4 w/ prns Outcome: Progressing   Problem: RH KNOWLEDGE DEFICIT BRAIN INJURY Goal: RH STG INCREASE KNOWLEDGE OF SELF CARE AFTER BRAIN INJURY Description: Manage increase knowledge of self care after brain injury with supervision assistance from wife using educational materials provided Outcome: Progressing   Problem: Education: Goal: Knowledge of General Education information will improve Description: Including pain rating scale, medication(s)/side effects and non-pharmacologic comfort measures Outcome: Progressing   Problem: Health Behavior/Discharge Planning: Goal: Ability to manage health-related needs will improve Outcome: Progressing   Problem: Clinical Measurements: Goal: Ability to maintain clinical measurements within normal limits will improve Outcome: Progressing Goal: Will remain free from infection Outcome: Progressing Goal: Diagnostic test results will improve Outcome: Progressing Goal: Respiratory complications will improve Outcome:  Progressing Goal: Cardiovascular complication will be avoided Outcome: Progressing   Problem: Activity: Goal: Risk for activity intolerance will decrease Outcome: Progressing   Problem: Nutrition: Goal: Adequate nutrition will be maintained Outcome: Progressing   Problem: Coping: Goal: Level of anxiety will decrease Outcome: Progressing   Problem: Elimination: Goal: Will not experience complications related to bowel motility Outcome: Progressing Goal: Will not experience complications related to urinary retention Outcome: Progressing   Problem: Pain Managment: Goal: General experience of comfort will improve and/or be controlled Outcome: Progressing   Problem: Safety: Goal: Ability to remain free from injury will improve Outcome: Progressing   Problem: Skin Integrity: Goal: Risk for impaired skin integrity will decrease Outcome: Progressing

## 2024-05-21 NOTE — Progress Notes (Signed)
 Occupational Therapy Session Note  Patient Details  Name: Roger Black MRN: [id number] Date of Birth: Feb 13, 1956  Session 1 Today's Date: 05/21/2024 OT Individual Time: 9265-9154 OT Individual Time Calculation (min): 71 min    Session 2 Today's Date: 05/21/2024 OT Individual Time: 8964-8884 OT Individual Time Calculation (min): 40 min    Short Term Goals: Week 1:  OT Short Term Goal 1 (Week 1): Pt will use LRAD and don pants with min A OT Short Term Goal 2 (Week 1): Pt will complete stand pivot to the [facility] with min A OT Short Term Goal 3 (Week 1): Pt will complete toileting tasks with min A OT Short Term Goal 4 (Week 1): Pt will tolerate sitting up in the w/c for 2 hours to demo improved activity tolerance  Skilled Therapeutic Interventions/Progress Updates:    Session 1 Pt received supine with 10/10 in his back and legs. He reports ongoing sensory deficits in his BLE with L foot drop as well. He came to EOB with mod cueing for technique as pt prefers to pull on staff and then he was able to come to EOB with (S). He completed sit > stand from EOB with min A. He pivoted to the w/c with min-mod A overall, very poor attention to task and pt extremely verbose. He required stern cueing to stop talking and focus on task at hand to reduce fall as his legs became more and more unsteady as he continued to stand. He reported improvement in pain once sitting, stating wow my back doesn't hurt. He was incredibly distracted and unable to focus on oral care- requiring max cueing to initiate- more than 15 min after he began. He was able to complete oral care with (S) overall. He completed UB bathing and dressing with (S). LB bathing in standing with mod A for standing balance support, especially when removing UE from the RW. He was instructed in reacher use for LB dressing and was able to thread pants over BLE with min A and then required assist to pull up in standing. He returned to supine in bed  with mod A. Replaced tape on his glasses to occlude for diplopia. He was left supine with all needs met, bed alarm set.   Session 2 Pt received sitting in the w/c with 6/10 pain in his back- premedicated. Pt was taken via w/c to the therapy gym for time management. Session focused on [facility] for carryover to self cathing and management of buttons during dressing. He worked on threading task using small beads at midline. He was easily frustrated when dropping beads and required encouragement and education for indication of task. He then completed brief UE strengthening using a 4 lb dowel- chest press, shoulder press, and bicep curl for carryover to UE endurance during RW use. 2x10 repetitions completed. He returned to his room and transferred back to bed with mod A. He was left supine with all needs met bed alarm set.   Therapy Documentation Precautions:  Precautions Precautions: Fall Precaution/Restrictions Comments: BP <150 Restrictions Weight Bearing Restrictions Per Provider Order: No  Therapy/Group: Individual Therapy  [name] 05/21/2024, 7:57 AM

## 2024-05-21 NOTE — Progress Notes (Signed)
 Patient ID: Nate Common, male   DOB: 09/16/1955, 68 y.o.   MRN: [id number]  1018- SW left message for pt wife requesting return phone call to confirm family edu for Mon or Tues 1pm-4pm.  Graeme Jude, MSW, LCSW Office: [phone] Cell: [phone] Fax: [phone]

## 2024-05-21 NOTE — Progress Notes (Signed)
 Occupational Therapy Session Note  Patient Details  Name: Markevius Trombetta MRN: [id number] Date of Birth: 1955-11-03  Today's Date: 05/21/2024 OT Individual Time: 8964-8884 OT Individual Time Calculation (min): 40 min    Short Term Goals: Week 1:  OT Short Term Goal 1 (Week 1): Pt will use LRAD and don pants with min A OT Short Term Goal 2 (Week 1): Pt will complete stand pivot to the [facility] with min A OT Short Term Goal 3 (Week 1): Pt will complete toileting tasks with min A OT Short Term Goal 4 (Week 1): Pt will tolerate sitting up in the w/c for 2 hours to demo improved activity tolerance  Skilled Therapeutic Interventions/Progress Updates:      Therapy Documentation Precautions:  Precautions Precautions: Fall Precaution/Restrictions Comments: BP <150 Restrictions Weight Bearing Restrictions Per Provider Order: No General: Pt supine in bed upon OT arrival, agreeable to OT session. Pt wife present.  Pain: no pain reported  Other Treatments: Upon arrival pt upset d/t food being delivered when pt out of hospital for cancer tx appointment. OT educating pt on meal process within the hospital and about grounds pass opportunities. Pt and family interested and pt primary nurse notified. OT and pt with wife also discussing therapies and goals set for pt as well as education about family training and DME. Pt and wife appreciative for resources.    Pt supine in bed with bed alarm activated, 2 bed rails up, call light within reach and 4Ps assessed.   Therapy/Group: Individual Therapy  Camie Hoe, OTD, OTR/L 05/21/2024, 4:30 PM

## 2024-05-21 NOTE — Progress Notes (Addendum)
 PROGRESS NOTE   Subjective/Complaints:  Feeling well this AM; hip pain is improving. Has reduced sensation in L>R LE but was told by Oncology his symptoms are improving. Continues with some sensation of needing to urinate but no success. NO myoclonus with UE today!  Liquid bowel movements overnight, continuing to need straight cath for all voids, volumes lower 400s and 600s.  Vitals are stable.  Pain significantly improved rating 2 and 4 out of 10 overnight.  ROS:  Positive for constipation, urinary retention, and bilateral hip pain/spasms   Objective:   No results found.  Recent Labs    05/19/24 0519  WBC 6.5  HGB 14.5  HCT 43.1  PLT 164   Recent Labs    05/19/24 0519 05/20/24 0539  NA 132* 132*  K 5.1 4.9  CL 100 103  CO2 20* 20*  GLUCOSE 110* 119*  BUN 62* 47*  CREATININE 1.26* 1.09  CALCIUM 9.3 8.8*    Intake/Output Summary (Last 24 hours) at 05/21/2024 0834 Last data filed at 05/21/2024 0800 Gross per 24 hour  Intake 1217.37 ml  Output 1650 ml  Net -432.63 ml        Physical Exam: Vital Signs Blood pressure 127/84, pulse 78, temperature 98 F (36.7 C), temperature source Oral, resp. rate 15, height 5' 8 (1.727 m), weight 91 kg, SpO2 95%.  Constitutional: No distress . Vital signs reviewed.  Sitting up in therapies  HEENT: NCAT, EOMI, oral membranes moist Neck: supple Cardiovascular: RRR without murmur. No JVD    Respiratory/Chest: CTA Bilaterally without wheezes or rales. Normal effort    GI/Abdomen: BS +, non-tender, non-distended Ext: no clubbing, cyanosis, or edema Psych: pleasant and cooperative  Skin: C/D/I. No apparent lesions. + Craniotomy-staples removed, well-approximated + PIV c/d/i  MSK:      No apparent deformity.       Neurologic exam:  Cognition: AAO to person, place, time and event.  + Impulsive, poor awareness   Strength: Grossly 5 out of 5 throughout, complicated by  pain/spasms with lower extremity hip flexion--improvde  Language: Fluent, No substitutions or neoglisms. No dysarthria.  .  Memory: Mild deficits.  Insight: Good insight into current condition.  Sensation: intact BL UE; hypersensitivity in BL LE to light touch, R reduced more than L  Reflexes: 2+ in BL UE and 0 BL LEs. Negative Hoffman's and babinski signs bilaterally.   CN: L eye difficulty converging, otherwise intact--no change Coordination:  Bilateral upper extremity loss of tone in hands with intention - not present today  Spasticity: MAS 0 in all extremities.      Assessment/Plan: 1. Functional deficits which require 3+ hours per day of interdisciplinary therapy in a comprehensive inpatient rehab setting. Physiatrist is providing close team supervision and 24 hour management of active medical problems listed below. Physiatrist and rehab team continue to assess barriers to discharge/monitor patient progress toward functional and medical goals  Care Tool:  Bathing    Body parts bathed by patient: Right arm, Left arm, Abdomen, Chest, Front perineal area, Right upper leg, Left upper leg, Right lower leg, Left lower leg, Face   Body parts bathed by helper: Buttocks  Bathing assist Assist Level: Moderate Assistance - Patient 50 - 74%     Upper Body Dressing/Undressing Upper body dressing   What is the patient wearing?: Pull over shirt    Upper body assist Assist Level: Contact Guard/Touching assist    Lower Body Dressing/Undressing Lower body dressing      What is the patient wearing?: Underwear/pull up, Pants     Lower body assist Assist for lower body dressing: Moderate Assistance - Patient 50 - 74%     Toileting Toileting    Toileting assist Assist for toileting: Moderate Assistance - Patient 50 - 74%     Transfers Chair/bed transfer  Transfers assist     Chair/bed transfer assist level: Moderate Assistance - Patient 50 - 74%      Locomotion Ambulation   Ambulation assist      Assist level: Moderate Assistance - Patient 50 - 74% Assistive device: No Device Max distance: 15'   Walk 10 feet activity   Assist     Assist level: Moderate Assistance - Patient - 50 - 74% Assistive device: No Device   Walk 50 feet activity   Assist Walk 50 feet with 2 turns activity did not occur: Safety/medical concerns         Walk 150 feet activity   Assist Walk 150 feet activity did not occur: Safety/medical concerns         Walk 10 feet on uneven surface  activity   Assist Walk 10 feet on uneven surfaces activity did not occur: Safety/medical concerns         Wheelchair     Assist Is the patient using a wheelchair?: Yes Type of Wheelchair: Manual    Wheelchair assist level: Dependent - Patient 0% Max wheelchair distance: 150'    Wheelchair 50 feet with 2 turns activity    Assist        Assist Level: Dependent - Patient 0%   Wheelchair 150 feet activity     Assist      Assist Level: Dependent - Patient 0%   Blood pressure 127/84, pulse 78, temperature 98 F (36.7 C), temperature source Oral, resp. rate 15, height 5' 8 (1.727 m), weight 91 kg, SpO2 95%.  Medical Problem List and Plan: 1. 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 Daughter will be moving in with them. Myoclonus in [place] remains challenging; Min A ADLs overall min-mod for transfers. Amb 15 ft with RW limited by pain. Mild-moderate cognitive deficits variable depending on XRT.   2.  Antithrombotics: -DVT/anticoagulation:  Pharmaceutical: Lovenox             -antiplatelet therapy:  N/A  3. Pain Management/spasms: percocet prn.   - 10-24: Extreme difficulty tolerating therapies due to back and lower hip pain and spasms with mobility.  baclofen to 10 mg 3 times daily, add tizanidine 2 mg 3 times daily, schedule OxyContin 10 mg every 12 hours, continue as needed Percocet 5 mg every 4 hours.  - 10-25: Increased OxyContin to 15 mg twice daily, increase baclofen to 15 mg  3 times daily; immediately notified next therapy session the patient was getting lethargic with these changes, so reverted to prior  - 10-26: Patient pain much improved on current regimen.  Unfortunately, appears to be somewhat sedated and euphoric on OxyContin 10 mg twice daily.  Not using as needed oxycodone, will instead schedule 5 mg 2 times daily before therapies in addition to as needed and DC long-acting.   - 10/27: perhaps a little more pain today but cognitively seemed more appropriate compared to what's recorded- no changes to regimen  10-28: Pain under better control, continues with spasms but seems to be doing better overall.  4. Mood/Behavior/Sleep: LCSW to follow for evaluation and support             --continue ambien prn.              -antipsychotic agents: N/A  - 10-29: Developing some confusion overnight.  Schedule Ambien as he is using it nightly, 5 mg.  Add as needed trazodone 50 mg nightly.  5. Neuropsych/cognition: This patient may be intermittently  capable of making decisions on his own behalf. 6. Skin/Wound Care: Routine pressure relief measures.  - Postop day #11 craniotomy  will be 10-27 for staple removal   10/27 dc staples today  7. Fluids/Electrolytes/Nutrition: Monitor I/O.   8.  Hyperglycemia due to steroids for lung cancer w/brain mets: XRT started today and at end of day as able to help with tolerance of therapy             --on  Decadron 4 mg TID and fasting BS 120-150 range.  --Will check A1c in am--5.7, prediabetic - Fair control, DC blood glucose checks Recent Labs     05/18/24 1232 05/19/24 0556 05/21/24 0617  GLUCAP 151* 114* 110*      9. COPD: On Singulair with albuterol prn.  10. HTN: Monitor BP TID---on avapro and cardura.   - Monitor with therapies today  - 10-25: Increase Cardura for urinary retention to 3 mg nightly  - 10-26: Blood pressures trending low; reduce Avapro 300 to 150 mg daily.  May need to reduce Cardura again if BP does not tolerate.  10/27 hold Avapro--BP improved 10-28--monitor on current regimen    05/21/2024    5:03 AM 05/20/2024    9:19 PM 05/20/2024    7:50 PM  Vitals with BMI  Systolic 127 116 883  Diastolic 84 76 76  Pulse 78  81    11. CAD: On Avapro and statin. Monitor for symptoms with increase in activity.              --continue to hold spironolactone and torsemide.    - weights stable Filed Weights   05/15/24 1727 05/16/24 1544 05/17/24 0504  Weight: 91.1 kg 91.1 kg 91 kg    12.  Hyponatremia: Na 132 @ admission-->131.  --Recheck BMET in am--stable 10/27 stable at 132  13. Acute on chronic renal failure: Encourage fluid intake.  BUN/SCr 27/1.32-->61/1.38 -- Monitor potassium levels as baseline 4.9-5.0-->continue ARB for now.  - Recheck CMET in am--improving BUN 37, creatinine 1.14, potassium 4.5 -10/27 BUN/Cr back up to 62/1.6 today, k+ 5.1 - hold avapro for now, push fluids and recheck in AM 10-28: BUN down to 47, creatinine normalized 1.09.  Continue to encourage p.o. fluids.  14.  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  - Baclofen 10 mg 3 times daily  - Tizanidine 2 mg 3 times daily  -  10-25: Start dantrolene 25 mg daily, after 1 week test LFTs and increase to 3 times daily if tolerated.  Add clonazepam 0.5 mg 3 times daily as needed for myoclonus.  May need to add VPA if myoclonus worsens.  10-26/27: Spasms much better controlled, has not needed to use clonazepam yet.  Trend.  10-28: Start low-dose Keppra 500 mg twice daily for upper extremity  myoclonus--notable improvement 10/29!  15.  Neurogenic bladder/urinary retention.  Likely secondary to cauda equina involvement, flaccid bladder.  - Ongoing requirement of intermittent straight cathing; increase Cardura to 3 mg nightly  - 10-26: Added Urecholine 10 mg 3 times daily--if no response with up titration/intolerable side effects, would transition towards patient learning intermittent straight cathing   -10/27 cathed for 800cc this am.    -urecholine just added, consider increase in AM to 25mg  if still retaining   -encouraged up to [facility] or toilet to empty  - 10-28: Increase frequency of cath to every 4 hours due to large volumes, increase Urecholine to 25 mg 3 times daily.  If no improvement with these measures, will need to drain patient on straight cathing versus place Foley based on upper extremity coordination and bladder volumes greater than 2.5 L/day.  - 10-29: Straight caths volumes better.  Continue Urecholine through today, renal ultrasound for structural cause assessment.  If abdominal cramping from Urecholine intolerable, will DC and discuss placement of Foley catheter  16.  Constipation/diarrhea.  - 10-29: Liquid bowel movements yesterday after sorbitol.  Move MiraLAX to as needed, reduce Senokot S to nightly.  Monitor today due to possibility of abdominal cramping with Urecholine.  LOS: 6 days A FACE TO FACE EVALUATION WAS PERFORMED  Roger Black Likes 05/21/2024, 8:34 AM

## 2024-05-21 NOTE — Progress Notes (Signed)
   05/21/24 0940  Spiritual Encounters  Type of Visit Attempt (pt unavailable)   Chaplain stopped by and Pt was unavailable for visit. Will follow up later.

## 2024-05-21 NOTE — Congregational Nurse Program (Addendum)
 Pt has watery stool please hold bowel regimen. Pt has intermittent delirium at night.

## 2024-05-21 NOTE — Progress Notes (Signed)
 Physical Therapy Session Note  Patient Details  Name: Roger Black MRN: [id number] Date of Birth: Jul 18, 1956  Today's Date: 05/21/2024 PT Individual Time: 9151-9054 PT Individual Time Calculation (min): 57 min   Short Term Goals: Week 1:  PT Short Term Goal 1 (Week 1): Pt will complete bed mobility with minA consistently. PT Short Term Goal 2 (Week 1): Pt will complete sit to stand with minA consistently. PT Short Term Goal 3 (Week 1): Pt will complete bed to chair transfer with minA consistently. PT Short Term Goal 4 (Week 1): Pt will ambulate x100' with minA and LRAD.  Skilled Therapeutic Interventions/Progress Updates:     Pt received supine in bed and agrees to therapy. Pain reported in bilateral anterior hips. PT provides rest breaks as needed to manage pain. Pt very slow to initiate movement, becoming distracted very easily and requiring redirection. Eventually pt performs supine to sit with cues for logrolling and use of bed features. PT assists to don shoes at EOB and pt performs stand step to [facility] with minA from slightly elevated bed. Pt has significantly decreased control of stand to sit, requiring cues for safety. WC transport to gym. Pt performs sit to stand with minA and then completes marching in place with cue to tap thighs on front of RW. Pt unable to reach bar of RW but completes x20 with alternating legs prior to rest break. Pt performs alternating LAQs with verbal and tactile cues for incresaed NM feedback. Pt stands and ambulates x15' with minA increasing to modA, with pt having difficulty decreasing reliance on upper extremities, and eventually requiring +2 to bring WC due to fatigue and requesting rest break. WC transport back to room. Pt left seated in WC with all needs within reach.   Therapy Documentation Precautions:  Precautions Precautions: Fall Precaution/Restrictions Comments: BP <150 Restrictions Weight Bearing Restrictions Per Provider Order:  No   Therapy/Group: Individual Therapy  Elsie JAYSON Dawn, PT, DPT 05/21/2024, 4:23 PM

## 2024-05-22 ENCOUNTER — Ambulatory Visit: Admitting: Radiation Oncology

## 2024-05-22 ENCOUNTER — Ambulatory Visit

## 2024-05-22 ENCOUNTER — Other Ambulatory Visit: Payer: Self-pay

## 2024-05-22 DIAGNOSIS — Z510 Encounter for antineoplastic radiation therapy: Secondary | ICD-10-CM | POA: Diagnosis not present

## 2024-05-22 LAB — RAD ONC ARIA SESSION SUMMARY
Course Elapsed Days: 7
Plan Fractions Treated to Date: 6
Plan Prescribed Dose Per Fraction: 3 Gy
Plan Total Fractions Prescribed: 10
Plan Total Prescribed Dose: 30 Gy
Reference Point Dosage Given to Date: 18 Gy
Reference Point Session Dosage Given: 3 Gy
Session Number: 6

## 2024-05-22 LAB — CBC
HCT: 42.1 % (ref 39.0–52.0)
Hemoglobin: 14.4 g/dL (ref 13.0–17.0)
MCH: 32.8 pg (ref 26.0–34.0)
MCHC: 34.2 g/dL (ref 30.0–36.0)
MCV: 95.9 fL (ref 80.0–100.0)
Platelets: 112 K/uL — ABNORMAL LOW (ref 150–400)
RBC: 4.39 MIL/uL (ref 4.22–5.81)
RDW: 12.5 % (ref 11.5–15.5)
WBC: 4.9 K/uL (ref 4.0–10.5)
nRBC: 0 % (ref 0.0–0.2)

## 2024-05-22 LAB — BASIC METABOLIC PANEL WITH GFR
Anion gap: 12 (ref 5–15)
BUN: 39 mg/dL — ABNORMAL HIGH (ref 8–23)
CO2: 20 mmol/L — ABNORMAL LOW (ref 22–32)
Calcium: 9.2 mg/dL (ref 8.9–10.3)
Chloride: 101 mmol/L (ref 98–111)
Creatinine, Ser: 1 mg/dL (ref 0.61–1.24)
GFR, Estimated: >60 mL/min (ref >60)
Glucose, Bld: 120 mg/dL — ABNORMAL HIGH (ref 70–99)
Potassium: 4.8 mmol/L (ref 3.5–5.1)
Sodium: 133 mmol/L — ABNORMAL LOW (ref 135–145)

## 2024-05-22 LAB — GLUCOSE, CAPILLARY
Glucose-Capillary: 127 mg/dL — ABNORMAL HIGH (ref 70–99)
Glucose-Capillary: 116 mg/dL — ABNORMAL HIGH (ref 70–99)

## 2024-05-22 MED ORDER — AMITRIPTYLINE HCL 25 MG PO TABS
25.0000 mg | ORAL_TABLET | Freq: Every day | ORAL | Status: DC
  Administered 2024-05-22: 25 mg via ORAL
  Filled 2024-05-22: qty 1

## 2024-05-22 MED ORDER — POLYETHYLENE GLYCOL 3350 17 G PO PACK
17.0000 g | PACK | Freq: Every day | ORAL | Status: DC
  Administered 2024-05-22 – 2024-05-26 (×5): 17 g via ORAL
  Filled 2024-05-22 (×5): qty 1

## 2024-05-22 MED ORDER — SENNOSIDES-DOCUSATE SODIUM 8.6-50 MG PO TABS
1.0000 | ORAL_TABLET | Freq: Two times a day (BID) | ORAL | Status: DC
  Administered 2024-05-22 – 2024-06-02 (×19): 1 via ORAL
  Filled 2024-05-22 (×23): qty 1

## 2024-05-22 NOTE — Progress Notes (Signed)
 Physical Therapy Session Note  Patient Details  Name: Roger Black MRN: [id number] Date of Birth: [birth year]  Today's Date: 05/22/2024 PT Individual Time: 0802-0858 PT Individual Time Calculation (min): 56 min   Short Term Goals: Week 1:  PT Short Term Goal 1 (Week 1): Pt will complete bed mobility with minA consistently. PT Short Term Goal 2 (Week 1): Pt will complete sit to stand with minA consistently. PT Short Term Goal 3 (Week 1): Pt will complete bed to chair transfer with minA consistently. PT Short Term Goal 4 (Week 1): Pt will ambulate x100' with minA and LRAD.  Skilled Therapeutic Interventions/Progress Updates:     Pt received semi reclined in bed. Attempts to refuse therapy due to having a bad morning for various reasons. PT provides active listening and then provides education on importance of participation. Eventually pt is agreeable to participation. Pt reports pain in bilateral hips. PT provides education on mobility benefits for pain relief, as well as rest breaks to manage pain. Pt performs supine to sit very slowly due to becoming very easily distracted and requiring redirection to task, as well as verbal cues for use of bed features and body mechanics. Extended time spent sitting at EOB to acclimate to upright positioning.   Pt performs stand step to [facility] with minA from elevated HOB, with cues for hand placement and use of RW. WC transport to gym. Pt performs stand step to Nustep with similar assistance and cues. Pt completes Nustep for endurance training and increasing available ROM in hips and knees due to pt's pain; Pt performs x15:00 at workload of 5 with average steps per minute ~33. PT provides rest breaks as needed to manage pain. PT provides cues for hand and foot placement and maintaining hips in neutral rotation. Pt completes stand step transfer back to [facility] with RW and CGA, with improved eccentric control of stand to sit. Pt then ambulates x115' back to room with  RW and minA, with cues to decrease WB through RW for improved body mechanics and energy conservation. Pt performs sit to supine with cues for sequencing. Left with all needs within reach.  Therapy Documentation Precautions:  Precautions Precautions: Fall Precaution/Restrictions Comments: BP <150 Restrictions Weight Bearing Restrictions Per Provider Order: No   Therapy/Group: Individual Therapy  Elsie JAYSON Dawn, PT, DPT 05/22/2024, 4:48 PM

## 2024-05-22 NOTE — Progress Notes (Signed)
 Physical Therapy Session Note  Patient Details  Name: Roger Black MRN: [id number] Date of Birth: 20-Apr-1956  Today's Date: 05/22/2024 PT Individual Time: 1105-1130 PT Individual Time Calculation (min): 25 min   Short Term Goals: Week 1:  PT Short Term Goal 1 (Week 1): Pt will complete bed mobility with minA consistently. PT Short Term Goal 2 (Week 1): Pt will complete sit to stand with minA consistently. PT Short Term Goal 3 (Week 1): Pt will complete bed to chair transfer with minA consistently. PT Short Term Goal 4 (Week 1): Pt will ambulate x100' with minA and LRAD.  Skilled Therapeutic Interventions/Progress Updates: Pt presented in bed agreeable to therapy with encouragement. Session focused on education and initial review of binder. PTA recommended to speak with wife to also have her review binder due to some information on caregiver support. Pt receptive to some information however was also very tangential at times. Nsg staff came in to check skin check with pt able to roll to R with supervision and use of bed features. Pt returned to supine and pt left in bed at end of session with DO present for daily assessment.      Therapy Documentation Precautions:  Precautions Precautions: Fall Precaution/Restrictions Comments: BP <150 Restrictions Weight Bearing Restrictions Per Provider Order: No General:   Vital Signs:   Pain: Pain Assessment Pain Scale: 0-10 Pain Score: 10-Worst pain ever Pain Location: Leg Pain Intervention(s): Medication (See eMAR)   Therapy/Group: Individual Therapy  Roxan Yamamoto 05/22/2024, 12:16 PM

## 2024-05-22 NOTE — Plan of Care (Signed)
  Problem: Consults Goal: RH BRAIN INJURY PATIENT EDUCATION Description: Description: See Patient Education module for eduction specifics Outcome: Progressing   Problem: RH BOWEL ELIMINATION Goal: RH STG MANAGE BOWEL WITH ASSISTANCE Description: STG Manage Bowel with supervision Assistance. Outcome: Progressing   Problem: RH BLADDER ELIMINATION Goal: RH STG MANAGE BLADDER WITH ASSISTANCE Description: STG Manage Bladder With supervision Assistance Outcome: Progressing   Problem: RH SKIN INTEGRITY Goal: RH STG SKIN FREE OF INFECTION/BREAKDOWN Description: Manage skin free of infection with supervision assistance Outcome: Progressing   Problem: RH SAFETY Goal: RH STG ADHERE TO SAFETY PRECAUTIONS W/ASSISTANCE/DEVICE Description: STG Adhere to Safety Precautions With supervision Assistance/Device. Outcome: Progressing   Problem: RH PAIN MANAGEMENT Goal: RH STG PAIN MANAGED AT OR BELOW PT'S PAIN GOAL Description: <4 w/ prns Outcome: Progressing   Problem: RH KNOWLEDGE DEFICIT BRAIN INJURY Goal: RH STG INCREASE KNOWLEDGE OF SELF CARE AFTER BRAIN INJURY Description: Manage increase knowledge of self care after brain injury with supervision assistance from wife using educational materials provided Outcome: Progressing   Problem: Education: Goal: Knowledge of General Education information will improve Description: Including pain rating scale, medication(s)/side effects and non-pharmacologic comfort measures Outcome: Progressing   Problem: Health Behavior/Discharge Planning: Goal: Ability to manage health-related needs will improve Outcome: Progressing   Problem: Clinical Measurements: Goal: Ability to maintain clinical measurements within normal limits will improve Outcome: Progressing Goal: Will remain free from infection Outcome: Progressing Goal: Diagnostic test results will improve Outcome: Progressing Goal: Respiratory complications will improve Outcome:  Progressing Goal: Cardiovascular complication will be avoided Outcome: Progressing   Problem: Activity: Goal: Risk for activity intolerance will decrease Outcome: Progressing   Problem: Nutrition: Goal: Adequate nutrition will be maintained Outcome: Progressing   Problem: Coping: Goal: Level of anxiety will decrease Outcome: Progressing   Problem: Elimination: Goal: Will not experience complications related to bowel motility Outcome: Progressing Goal: Will not experience complications related to urinary retention Outcome: Progressing   Problem: Pain Managment: Goal: General experience of comfort will improve and/or be controlled Outcome: Progressing   Problem: Safety: Goal: Ability to remain free from injury will improve Outcome: Progressing   Problem: Skin Integrity: Goal: Risk for impaired skin integrity will decrease Outcome: Progressing

## 2024-05-22 NOTE — Progress Notes (Signed)
 Speech Language Pathology Daily Session Note  Patient Details  Name: Baby Stairs MRN: [id number] Date of Birth: 01/06/1956  Today's Date: 05/22/2024 SLP Individual Time: 1000-1100 SLP Individual Time Calculation (min): 60 min  Short Term Goals: Week 1: SLP Short Term Goal 1 (Week 1): Pt will recall recent/relevant information w/ minA SLP Short Term Goal 2 (Week 1): Pt will solve mildly complex problems w/ minA  Skilled Therapeutic Interventions:   Pt, his wife, and nursing were greeted at bedside. He was awake/alert and agreeable to tx targeting cognition. He remains very verbose, requiring multiple redirections back to task throughout tx sesion. He was Aox4. SLP facilitated memory task w/ 5 pictures. Immediately, he was able to recall 5/5 items. After 8 and additional 10 min delay, he was able to recall 5/5 items as well. Between recall times, he completed verbal task targeting abstract problem solving/reasoning and working memory. He required minA throughout task. Again, tangential speech negatively impacted success and prolonged time required to complete task. At the end of tx tasks, he was left in his bed w/ the alarm set and call light within reach. Recommend cont ST per POC.   Pain Pain Assessment Pain Scale: 0-10 Pain Score: 8  Pain Location: Generalized Pain Intervention(s): Medication (See eMAR)  Therapy/Group: Individual Therapy  [name] 05/22/2024, 5:18 PM

## 2024-05-22 NOTE — Congregational Nurse Program (Signed)
@  2000 and 2229 pt refused bladder scan and straight cath. Finally agree at a later time, see charting.

## 2024-05-22 NOTE — Progress Notes (Signed)
 PROGRESS NOTE   Subjective/Complaints:  No events overnight. Pain increasing overnight 8/10.  He states the spasms in his legs are not bothering him as much as reduce sensation and sensitivity in his bilateral legs today. AM labs stable; BUN/Cr downtrending.  Multiple small, liquid BMS yesterday.   Still requiring ISC for all voids. US  negative yesterday.  Discussed with patient and his wife at bedside likely need for long-term cathing with outpatient urology assessment; patient not agreeable to learning self cath at this time, wants Foley placed on discharge.  Did discuss significant risk of catheter associated UTIs with this, which patient is understanding of.  Wife does think cathing would be better, they will think on this together..   ROS:  Positive for constipation, urinary retention, and bilateral hip pain/spasms, bilateral foot sensory reduction   Objective:   US  RENAL Result Date: 05/21/2024 CLINICAL DATA:  Urinary retention EXAM: RENAL / URINARY TRACT ULTRASOUND COMPLETE COMPARISON:  CT 05/06/2024 FINDINGS: Right Kidney: Renal measurements: 10.3 x 4.3 x 4 cm = volume: 94 mL. Echogenicity within normal limits. No mass or hydronephrosis visualized. Left Kidney: Renal measurements: 10 x 4.5 x 3.6 cm = volume: 83 mL. Echogenicity within normal limits. No mass or hydronephrosis visualized. Bladder: Distended urinary bladder. Other: None. IMPRESSION: Negative renal ultrasound.  Distended urinary bladder. Electronically Signed   By: Luke Bun M.D.   On: 05/21/2024 21:22    Recent Labs    05/22/24 0504  WBC 4.9  HGB 14.4  HCT 42.1  PLT 112*   Recent Labs    05/20/24 0539 05/22/24 0504  NA 132* 133*  K 4.9 4.8  CL 103 101  CO2 20* 20*  GLUCOSE 119* 120*  BUN 47* 39*  CREATININE 1.09 1.00  CALCIUM 8.8* 9.2    Intake/Output Summary (Last 24 hours) at 05/22/2024 1009 Last data filed at 05/22/2024 0000 Gross per  24 hour  Intake 340 ml  Output 750 ml  Net -410 ml        Physical Exam: Vital Signs Blood pressure 102/75, pulse 83, temperature 97.6 F (36.4 C), temperature source Oral, resp. rate 16, height 5' 8 (1.727 m), weight 91 kg, SpO2 93%.  Constitutional: No distress . Vital signs reviewed.  Laying in bed HEENT: NCAT, EOMI, oral membranes moist Neck: supple Cardiovascular: RRR without murmur. No JVD    Respiratory/Chest: CTA Bilaterally without wheezes or rales. Normal effort    GI/Abdomen: BS +, non-tender, non-distended Ext: no clubbing, cyanosis, or edema Psych: pleasant and cooperative  Skin: C/D/I. No apparent lesions. + Craniotomy-staples removed, well-approximated + PIV c/d/i  MSK:      No apparent deformity.       Neurologic exam:  Cognition: AAO to person, place, time and event.  + Impulsive, poor awareness --significantly improved  Language: Fluent, No substitutions or neoglisms. No dysarthria.  .  Memory: Mild deficits.  Insight: Good insight into current condition.  Sensation: intact BL UE; hypersensitivity in BL LE to light touch, R reduced more than L  Reflexes: 2+ in BL UE and 0 BL LEs. Negative Hoffman's and babinski signs bilaterally.   CN: L eye difficulty converging, otherwise intact--no change Coordination:  Bilateral upper extremity loss of tone in hands with intention -present 1 time in the left hand today.  Spasticity: MAS 0 in all extremities.          Strength:                LUE: 5/5 SA, 5/5 EF, 5/5 EE, 5/5 WE, 5/5 FF, 5/5 FA                RUE:  5/5 SA, 5/5 EF, 5/5 EE, 5/5 WE, 5/5 FF, 5/5 FA                LLE: 3/5 HF, 4/5 KE, 4/5  DF, 4/5  EHL, 4/5  PF                 RLE: 3-/5 HF, 3/5 KE, 1/5  DF, 1/5  EHL, 1/5  PF     Assessment/Plan: 1. Functional deficits which require 3+ hours per day of interdisciplinary therapy in a comprehensive inpatient rehab setting. Physiatrist is providing close team supervision and 24 hour management of active  medical problems listed below. Physiatrist and rehab team continue to assess barriers to discharge/monitor patient progress toward functional and medical goals  Care Tool:  Bathing    Body parts bathed by patient: Right arm, Left arm, Abdomen, Chest, Front perineal area, Right upper leg, Left upper leg, Right lower leg, Left lower leg, Face   Body parts bathed by helper: Buttocks     Bathing assist Assist Level: Moderate Assistance - Patient 50 - 74%     Upper Body Dressing/Undressing Upper body dressing   What is the patient wearing?: Pull over shirt    Upper body assist Assist Level: Contact Guard/Touching assist    Lower Body Dressing/Undressing Lower body dressing      What is the patient wearing?: Underwear/pull up, Pants     Lower body assist Assist for lower body dressing: Moderate Assistance - Patient 50 - 74%     Toileting Toileting    Toileting assist Assist for toileting: Moderate Assistance - Patient 50 - 74%     Transfers Chair/bed transfer  Transfers assist     Chair/bed transfer assist level: Moderate Assistance - Patient 50 - 74%     Locomotion Ambulation   Ambulation assist      Assist level: Moderate Assistance - Patient 50 - 74% Assistive device: No Device Max distance: 15'   Walk 10 feet activity   Assist     Assist level: Moderate Assistance - Patient - 50 - 74% Assistive device: No Device   Walk 50 feet activity   Assist Walk 50 feet with 2 turns activity did not occur: Safety/medical concerns         Walk 150 feet activity   Assist Walk 150 feet activity did not occur: Safety/medical concerns         Walk 10 feet on uneven surface  activity   Assist Walk 10 feet on uneven surfaces activity did not occur: Safety/medical concerns         Wheelchair     Assist Is the patient using a wheelchair?: Yes Type of Wheelchair: Manual    Wheelchair assist level: Dependent - Patient 0% Max wheelchair  distance: 150'    Wheelchair 50 feet with 2 turns activity    Assist        Assist Level: Dependent - Patient 0%   Wheelchair 150 feet activity     Assist  Assist Level: Dependent - Patient 0%   Blood pressure 102/75, pulse 83, temperature 97.6 F (36.4 C), temperature source Oral, resp. rate 16, height 5' 8 (1.727 m), weight 91 kg, SpO2 93%.  Medical Problem List and Plan: 1. 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 Daughter will be moving in with them. Myoclonus in [place] remains challenging; Min A ADLs overall min-mod for transfers. Amb 15 ft with RW limited by pain. Mild-moderate cognitive deficits variable depending on XRT.   2.  Antithrombotics: -DVT/anticoagulation:  Pharmaceutical: Lovenox             -antiplatelet therapy: N/A  3. Pain Management/spasms: percocet prn.   - 10-24: Extreme difficulty tolerating therapies due to back and lower hip pain and spasms with mobility.  baclofen to 10 mg 3 times daily, add tizanidine 2 mg 3 times daily, schedule OxyContin 10 mg every 12 hours, continue as needed Percocet 5 mg every 4 hours.  - 10-25: Increased OxyContin to 15 mg twice daily, increase baclofen to 15 mg 3 times daily; immediately notified next therapy session the patient was getting lethargic with these changes, so reverted to prior  - 10-26: Patient pain much improved on current regimen.  Unfortunately, appears to be somewhat sedated and euphoric on OxyContin 10 mg twice daily.  Not using as needed oxycodone, will instead schedule 5 mg 2 times daily before therapies in addition to as needed and DC long-acting.   - 10/27: perhaps a  little more pain today but cognitively seemed more appropriate compared to what's recorded- no changes to regimen  10-28: Pain under better control, continues with spasms but seems to be doing better overall.  10-30: Add Elavil 25 mg nightly for peripheral nerve pain at nighttime; already optimized on duloxetine, Lyrica  4. Mood/Behavior/Sleep: LCSW to follow for evaluation and support             --continue ambien prn.              -antipsychotic agents: N/A  - 10-29: Developing some confusion overnight.  Schedule Ambien as he is using it nightly, 5 mg.  Add as needed trazodone 50 mg nightly.  - 10-30: No reports of confusion overnight.  Dors is sleeping well.  5. Neuropsych/cognition: This patient may be intermittently  capable of making decisions on his own behalf. 6. Skin/Wound Care: Routine pressure relief measures.  - Postop day #11 craniotomy  will be 10-27 for staple removal   10/27 dc staples today  7. Fluids/Electrolytes/Nutrition: Monitor I/O.   8.  Hyperglycemia due to steroids for lung cancer w/brain mets: XRT started today and at end of day as able to help with tolerance of therapy             --on  Decadron 4 mg TID and fasting BS 120-150 range.  --Will check A1c in am--5.7, prediabetic - Fair control, DC blood glucose checks Recent Labs    05/21/24 0617 05/21/24 1146  GLUCAP 110* 111*      9. COPD: On Singulair with albuterol prn.  10. HTN: Monitor BP TID---on avapro and cardura.   - Monitor with therapies today  - 10-25: Increase Cardura for urinary retention to 3 mg nightly  - 10-26: Blood pressures trending low; reduce Avapro 300 to 150 mg daily.  May need to reduce Cardura again if BP does not tolerate.  10/27 hold Avapro--BP improved 10-28--monitor on current regimen    05/22/2024    5:53 AM 05/21/2024   11:53 PM 05/21/2024    8:33 PM  Vitals with BMI  Systolic 102 123 876  Diastolic 75 84 84  Pulse 83  72    11. CAD: On Avapro and statin. Monitor for  symptoms with increase in activity.              --continue to hold spironolactone and torsemide.    - weights stable Filed Weights   05/15/24 1727 05/16/24 1544 05/17/24 0504  Weight: 91.1 kg 91.1 kg 91 kg    12.  Hyponatremia: Na 132 @ admission-->131.  --Recheck BMET in am--stable 10/27 stable at 132  13. Acute on chronic renal failure: Encourage fluid intake.  BUN/SCr 27/1.32-->61/1.38 -- Monitor potassium levels as baseline 4.9-5.0-->continue ARB for now.  - Recheck CMET in am--improving BUN 37, creatinine 1.14, potassium 4.5 -10/27 BUN/Cr back up to 62/1.6 today, k+ 5.1 - hold avapro for now, push fluids and recheck in AM 10-28: BUN down to 47, creatinine normalized 1.09.  Continue to encourage p.o. fluids.  14.  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  - Baclofen 10 mg 3 times daily  - Tizanidine 2 mg 3 times daily  - 10-25: Start dantrolene 25 mg daily, after 1 week test LFTs and increase to 3 times daily if tolerated.  Add clonazepam 0.5 mg 3 times daily as needed for myoclonus.  May need to add VPA if myoclonus worsens.  10-26/27: Spasms much better controlled, has not needed to use clonazepam yet.  Trend.  10-28: Start low-dose Keppra 500 mg twice daily for upper extremity myoclonus--notable improvement 10/29!  15.  Neurogenic bladder/urinary retention.  Likely secondary to cauda equina involvement, flaccid bladder.  - Ongoing requirement of intermittent straight cathing; increase Cardura to 3 mg nightly  - 10-26: Added Urecholine 10 mg 3 times daily--if no response with up titration/intolerable side effects, would transition towards patient learning intermittent straight cathing   -10/27 cathed for 800cc this am.    -urecholine just added, consider increase in AM to 25mg  if still retaining   -encouraged up to [facility] or toilet to empty  - 10-28: Increase frequency of cath to every 4 hours due to large volumes, increase Urecholine to 25  mg 3 times daily.  If no improvement with these measures, will need to drain patient on straight cathing versus place Foley based on upper extremity coordination and bladder volumes greater than 2.5 L/day.  - 10-29: Straight caths volumes better.  Continue Urecholine through today, renal ultrasound for structural cause assessment.  If abdominal cramping from Urecholine intolerable, will DC and discuss placement of Foley catheter  -10-30: Long discussion with patient, his wife regarding likely flaccid bladder secondary to cauda equina involvement.  Patient not agreeable to learning straight cathing at this time, wants Foley placed on discharge; will continue to discuss this with them  16.  Constipation/diarrhea.  - 10-29: Liquid bowel movements yesterday after sorbitol.  Move MiraLAX to as needed, reduce Senokot S to nightly.  Monitor today due to possibility of abdominal cramping with Urecholine.  -  10-30: Patient endorsing constipation, will increase Senokot S2 1 tab twice daily and MiraLAX daily  LOS: 7 days A FACE TO FACE EVALUATION WAS PERFORMED  Joesph JAYSON Likes 05/22/2024, 10:09 AM

## 2024-05-22 NOTE — Progress Notes (Signed)
 Occupational Therapy Session Note  Patient Details  Name: Roger Black MRN: [id number] Date of Birth: 1956-01-13  Today's Date: 05/22/2024 OT Individual Time: 1306-1350 OT Individual Time Calculation (min): 44 min    Short Term Goals: Week 1:  OT Short Term Goal 1 (Week 1): Pt will use LRAD and don pants with min A OT Short Term Goal 2 (Week 1): Pt will complete stand pivot to the [facility] with min A OT Short Term Goal 3 (Week 1): Pt will complete toileting tasks with min A OT Short Term Goal 4 (Week 1): Pt will tolerate sitting up in the w/c for 2 hours to demo improved activity tolerance  Skilled Therapeutic Interventions/Progress Updates:  Pt greeted supine in bed, pt agreeable to OT intervention.      Transfers/bed mobility/functional mobility:  Pt completed supine>sit with CGA. Pt completed sit>stand from EOB with RW with MINA. Pt completed stand pivot to w/c with RW with CGA.   Pt completed functional ambulation in gym with RW with MIN A and close chair follow, no episodes of buckling noted.   Pt completed blocked practice of sit>stands x5 with RW and CGA.     ADLs:  Grooming: pt completed seated oral care at sink with supervision.   Education:  Pt reports being worried about falling at home as he reports his wife can not help him up. Education providing on using local fire dept as assistance to get off floor if pt were to fall.               Ended session with pt seated in w/c at sink with wife present and all needs within reach.   Therapy Documentation Precautions:  Precautions Precautions: Fall Precaution/Restrictions Comments: BP <150 Restrictions Weight Bearing Restrictions Per Provider Order: No  Pain: Pt reports general malaise all over. Rest breaks provided as needed.    Therapy/Group: Individual Therapy  Ronal Mallie Needy 05/22/2024, 3:10 PM

## 2024-05-23 ENCOUNTER — Inpatient Hospital Stay (HOSPITAL_COMMUNITY)

## 2024-05-23 ENCOUNTER — Encounter: Payer: Self-pay | Admitting: *Deleted

## 2024-05-23 ENCOUNTER — Ambulatory Visit

## 2024-05-23 ENCOUNTER — Ambulatory Visit: Admitting: Radiation Oncology

## 2024-05-23 ENCOUNTER — Other Ambulatory Visit: Payer: Self-pay

## 2024-05-23 DIAGNOSIS — Z510 Encounter for antineoplastic radiation therapy: Secondary | ICD-10-CM | POA: Diagnosis not present

## 2024-05-23 LAB — URINALYSIS, W/ REFLEX TO CULTURE (INFECTION SUSPECTED)
Bilirubin Urine: NEGATIVE
Glucose, UA: NEGATIVE mg/dL
Ketones, ur: NEGATIVE mg/dL
Nitrite: NEGATIVE
Protein, ur: NEGATIVE mg/dL
Specific Gravity, Urine: 1.025 (ref 1.005–1.030)
pH: 5 (ref 5.0–8.0)

## 2024-05-23 LAB — RAD ONC ARIA SESSION SUMMARY
Course Elapsed Days: 8
Plan Fractions Treated to Date: 2
Plan Fractions Treated to Date: 7
Plan Prescribed Dose Per Fraction: 3 Gy
Plan Prescribed Dose Per Fraction: 9 Gy
Plan Total Fractions Prescribed: 10
Plan Total Fractions Prescribed: 3
Plan Total Prescribed Dose: 27 Gy
Plan Total Prescribed Dose: 30 Gy
Reference Point Dosage Given to Date: 18 Gy
Reference Point Dosage Given to Date: 21 Gy
Reference Point Session Dosage Given: 3 Gy
Reference Point Session Dosage Given: 9 Gy
Session Number: 7

## 2024-05-23 LAB — CBC
HCT: 44.7 % (ref 39.0–52.0)
Hemoglobin: 14.8 g/dL (ref 13.0–17.0)
MCH: 32.5 pg (ref 26.0–34.0)
MCHC: 33.1 g/dL (ref 30.0–36.0)
MCV: 98 fL (ref 80.0–100.0)
Platelets: 119 K/uL — ABNORMAL LOW (ref 150–400)
RBC: 4.56 MIL/uL (ref 4.22–5.81)
RDW: 12.6 % (ref 11.5–15.5)
WBC: 15.6 K/uL — ABNORMAL HIGH (ref 4.0–10.5)
nRBC: 0 % (ref 0.0–0.2)

## 2024-05-23 LAB — BASIC METABOLIC PANEL WITH GFR
Anion gap: 12 (ref 5–15)
BUN: 66 mg/dL — ABNORMAL HIGH (ref 8–23)
CO2: 21 mmol/L — ABNORMAL LOW (ref 22–32)
Calcium: 8.8 mg/dL — ABNORMAL LOW (ref 8.9–10.3)
Chloride: 99 mmol/L (ref 98–111)
Creatinine, Ser: 1.62 mg/dL — ABNORMAL HIGH (ref 0.61–1.24)
GFR, Estimated: 46 mL/min — ABNORMAL LOW (ref >60)
Glucose, Bld: 142 mg/dL — ABNORMAL HIGH (ref 70–99)
Potassium: 4.2 mmol/L (ref 3.5–5.1)
Sodium: 132 mmol/L — ABNORMAL LOW (ref 135–145)

## 2024-05-23 LAB — GLUCOSE, CAPILLARY: Glucose-Capillary: 150 mg/dL — ABNORMAL HIGH (ref 70–99)

## 2024-05-23 LAB — COMPREHENSIVE METABOLIC PANEL WITH GFR
ALT: 21 U/L (ref 0–44)
AST: 18 U/L (ref 15–41)
Albumin: 3.4 g/dL — ABNORMAL LOW (ref 3.5–5.0)
Alkaline Phosphatase: 44 U/L (ref 38–126)
Anion gap: 15 (ref 5–15)
BUN: 64 mg/dL — ABNORMAL HIGH (ref 8–23)
CO2: 16 mmol/L — ABNORMAL LOW (ref 22–32)
Calcium: 9.3 mg/dL (ref 8.9–10.3)
Chloride: 100 mmol/L (ref 98–111)
Creatinine, Ser: 1.69 mg/dL — ABNORMAL HIGH (ref 0.61–1.24)
GFR, Estimated: 44 mL/min — ABNORMAL LOW (ref >60)
Glucose, Bld: 138 mg/dL — ABNORMAL HIGH (ref 70–99)
Potassium: 5.2 mmol/L — ABNORMAL HIGH (ref 3.5–5.1)
Sodium: 131 mmol/L — ABNORMAL LOW (ref 135–145)
Total Bilirubin: 1.1 mg/dL (ref 0.0–1.2)
Total Protein: 5.9 g/dL — ABNORMAL LOW (ref 6.5–8.1)

## 2024-05-23 LAB — AMMONIA: Ammonia: 23 umol/L (ref 9–35)

## 2024-05-23 MED ORDER — SODIUM CHLORIDE 0.9 % IV SOLN: INTRAVENOUS | Status: AC

## 2024-05-23 MED ORDER — DOXAZOSIN MESYLATE 2 MG PO TABS: 2.0000 mg | ORAL_TABLET | Freq: Every day | ORAL | Status: DC

## 2024-05-23 MED ORDER — SODIUM CHLORIDE 0.9 % IV BOLUS
1000.0000 mL | Freq: Once | INTRAVENOUS | Status: AC
  Administered 2024-05-23: 1000 mL via INTRAVENOUS

## 2024-05-23 MED ORDER — SODIUM CHLORIDE 0.9 % IV SOLN
2.0000 g | INTRAVENOUS | Status: DC
  Administered 2024-05-23 – 2024-05-26 (×4): 2 g via INTRAVENOUS
  Filled 2024-05-23 (×4): qty 20

## 2024-05-23 MED ORDER — SORBITOL 70 % SOLN
30.0000 mL | Freq: Once | Status: AC
  Administered 2024-05-23: 30 mL via ORAL
  Filled 2024-05-23: qty 30

## 2024-05-23 MED ORDER — BACLOFEN 5 MG HALF TABLET
5.0000 mg | ORAL_TABLET | Freq: Three times a day (TID) | ORAL | Status: DC
  Administered 2024-05-23 – 2024-06-03 (×31): 5 mg via ORAL
  Filled 2024-05-23 (×33): qty 1

## 2024-05-23 MED ORDER — CHLORHEXIDINE GLUCONATE CLOTH 2 % EX PADS
6.0000 | MEDICATED_PAD | Freq: Every day | CUTANEOUS | Status: DC
  Administered 2024-05-24 – 2024-05-26 (×3): 6 via TOPICAL

## 2024-05-23 MED ORDER — SODIUM CHLORIDE 0.9 % BOLUS PEDS
1000.0000 mL | Freq: Once | INTRAVENOUS | Status: AC
  Administered 2024-05-23: 1000 mL via INTRAVENOUS

## 2024-05-23 NOTE — Progress Notes (Signed)
 Occupational Therapy Session Note  Patient Details  Name: Roger Black MRN: [id number] Date of Birth: Jan 07, 1956  Today's Date: 05/23/2024 OT Individual Time: 9067-8954 OT Individual Time Calculation (min): 73 min    Short Term Goals: Week 1:  OT Short Term Goal 1 (Week 1): Pt will use LRAD and don pants with min A OT Short Term Goal 2 (Week 1): Pt will complete stand pivot to the [facility] with min A OT Short Term Goal 3 (Week 1): Pt will complete toileting tasks with min A OT Short Term Goal 4 (Week 1): Pt will tolerate sitting up in the w/c for 2 hours to demo improved activity tolerance  Skilled Therapeutic Interventions/Progress Updates:  Patient agreeable to participate in OT session. Reports 6/10 pain level.   Patient participated in skilled OT session focusing on self care at bed level due to MD order. Patient able to complete UB bathing SU Assist. UB dressing bed level with mod A due to poor sitting balance and core strength. Patient then completed LB dressing max A, LB bathing max A. Patient required increased rest breaks and orientation during session, as well as cues to be alert due to medication.   Patient received following fall earlier in day with reported disorientation, unintelligible speech, and myoclonic jerking on LUE. Patient became more oriented and speech became clear and correct following self care. Patient then able to complete rolling in bed with mod A and core strengthening to increase ability to complete ADLs and functional mobility. During session pain medication and cough medication provided by nursing and patient set up for catheterization for labs. Alarm on all needs in reach.    Therapy Documentation Precautions:  Precautions Precautions: Fall Precaution/Restrictions Comments: BP <150 Restrictions Weight Bearing Restrictions Per Provider Order: No Therapy/Group: Individual Therapy  D'mariea L Adelaida Reindel 05/23/2024, 12:32 PM

## 2024-05-23 NOTE — Progress Notes (Signed)
 Patient was in the dayroom with therapist and during the transfer the patient fell on their bottom. VS completed BP 87/71, P 89, rr 16, spO2 91 and patient was taken back to their room

## 2024-05-23 NOTE — Progress Notes (Signed)
 PROGRESS NOTE   Subjective/Complaints:  Fall in gym this AM, assisted, no injury or LOC. Noted increased lethargy and flapping with upper arms this morning. Patient confused, restless, extending / sliding out of chair, says he feels drunk; fully oriented and neurologically consistent with past exams in strength and CN.  Pain in L hip bothersome today but still better controlled than it has been.   Vitals with orthostatic hypotension today, 87/71 in bed. Per nursing afternoon was doing better.   Mild intermittent wet cough noted on exam.   No BM since small 10/29  ROS:  Positive for cough, constipation, urinary retention, and bilateral hip pain/spasms, bilateral foot sensory reduction   Objective:   US  RENAL Result Date: 05/21/2024 CLINICAL DATA:  Urinary retention EXAM: RENAL / URINARY TRACT ULTRASOUND COMPLETE COMPARISON:  CT 05/06/2024 FINDINGS: Right Kidney: Renal measurements: 10.3 x 4.3 x 4 cm = volume: 94 mL. Echogenicity within normal limits. No mass or hydronephrosis visualized. Left Kidney: Renal measurements: 10 x 4.5 x 3.6 cm = volume: 83 mL. Echogenicity within normal limits. No mass or hydronephrosis visualized. Bladder: Distended urinary bladder. Other: None. IMPRESSION: Negative renal ultrasound.  Distended urinary bladder. Electronically Signed   By: Luke Bun M.D.   On: 05/21/2024 21:22    Recent Labs    05/22/24 0504 05/23/24 0933  WBC 4.9 15.6*  HGB 14.4 14.8  HCT 42.1 44.7  PLT 112* 119*   Recent Labs    05/22/24 0504 05/23/24 0933  NA 133* 131*  K 4.8 5.2*  CL 101 100  CO2 20* 16*  GLUCOSE 120* 138*  BUN 39* 64*  CREATININE 1.00 1.69*  CALCIUM 9.2 9.3    Intake/Output Summary (Last 24 hours) at 05/23/2024 1450 Last data filed at 05/23/2024 1035 Gross per 24 hour  Intake 240 ml  Output 850 ml  Net -610 ml        Physical Exam: Vital Signs Blood pressure (!) 87/71, pulse 88,  temperature 97.6 F (36.4 C), resp. rate 16, height 5' 8 (1.727 m), weight 91 kg, SpO2 96%.   Constitutional: No distress . Vital signs reviewed.  Sitting up in wheelchair. HEENT: NCAT, EOMI, oral membranes moist. Neck: supple, no JVD. Cardiovascular: RRR without murmur. No JVD    Respiratory/Chest: Wet sounding, central cough on expiration.  No production.  Clear to auscultation in the bases and peripheral fields.    GI/Abdomen: BS + hypoactive, non-tender, mildly-distended Ext: no clubbing, cyanosis, or edema Psych: Restless, confused. Skin: C/D/I. No apparent lesions. + Craniotomy-staples removed, well-approximated + PIV c/d/i  MSK:      No apparent deformity.  Moving all 4 limbs.       Neurologic exam:  Cognition: AAO to person, place, time with extended time for recall + Impulsive, poor awareness --worse today.  Insight: Poor insight into current condition--usually has good insight. Sensation: intact BL UE; reduced in bilateral lower extremities Reflexes: 2+ in BL UE and 0 BL LEs. Negative Hoffman's and babinski signs bilaterally.   CN: L eye difficulty converging, otherwise intact--no change Coordination:  Bilateral upper extremity loss of tone in hands with intention -noted at rest today 10-31 Spasticity: MAS 0 in  all extremities.          Strength: Unchanged on exam 10-31                LUE: 5/5 SA, 5/5 EF, 5/5 EE, 5/5 WE, 5/5 FF, 5/5 FA                RUE:  5/5 SA, 5/5 EF, 5/5 EE, 5/5 WE, 5/5 FF, 5/5 FA                LLE: 3/5 HF, 4/5 KE, 4/5  DF, 4/5  EHL, 4/5  PF                 RLE: 3-/5 HF, 3/5 KE, 1/5  DF, 1/5  EHL, 1/5  PF     Assessment/Plan: 1. Functional deficits which require 3+ hours per day of interdisciplinary therapy in a comprehensive inpatient rehab setting. Physiatrist is providing close team supervision and 24 hour management of active medical problems listed below. Physiatrist and rehab team continue to assess barriers to discharge/monitor patient  progress toward functional and medical goals  Care Tool:  Bathing    Body parts bathed by patient: Right arm, Left arm, Abdomen, Chest, Front perineal area, Right upper leg, Left upper leg, Right lower leg, Left lower leg, Face   Body parts bathed by helper: Buttocks     Bathing assist Assist Level: Moderate Assistance - Patient 50 - 74%     Upper Body Dressing/Undressing Upper body dressing   What is the patient wearing?: Pull over shirt    Upper body assist Assist Level: Contact Guard/Touching assist    Lower Body Dressing/Undressing Lower body dressing      What is the patient wearing?: Underwear/pull up, Pants     Lower body assist Assist for lower body dressing: Moderate Assistance - Patient 50 - 74%     Toileting Toileting    Toileting assist Assist for toileting: Moderate Assistance - Patient 50 - 74%     Transfers Chair/bed transfer  Transfers assist     Chair/bed transfer assist level: Minimal Assistance - Patient > 75%     Locomotion Ambulation   Ambulation assist      Assist level: Moderate Assistance - Patient 50 - 74% Assistive device: No Device Max distance: 15'   Walk 10 feet activity   Assist     Assist level: Moderate Assistance - Patient - 50 - 74% Assistive device: No Device   Walk 50 feet activity   Assist Walk 50 feet with 2 turns activity did not occur: Safety/medical concerns         Walk 150 feet activity   Assist Walk 150 feet activity did not occur: Safety/medical concerns         Walk 10 feet on uneven surface  activity   Assist Walk 10 feet on uneven surfaces activity did not occur: Safety/medical concerns         Wheelchair     Assist Is the patient using a wheelchair?: Yes Type of Wheelchair: Manual    Wheelchair assist level: Dependent - Patient 0% Max wheelchair distance: 150'    Wheelchair 50 feet with 2 turns activity    Assist        Assist Level: Dependent - Patient  0%   Wheelchair 150 feet activity     Assist      Assist Level: Dependent - Patient 0%   Blood pressure (!) 87/71, pulse 88, temperature 97.6 F (36.4 C),  resp. rate 16, height 5' 8 (1.727 m), weight 91 kg, SpO2 96%.  Medical Problem List and Plan: 1. 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 Daughter will be moving in with them. Myoclonus in Ues remains challenging; Min A ADLs overall min-mod for transfers. Amb 15 ft with RW limited by pain. Mild-moderate cognitive deficits variable depending on XRT.   - 10-31: More confused today, hypotensive, leukocytosis of 15 concerning for infection.  Most likely cause is urinary given retention, but will also check chest x-ray given cough.  IV fluids for AKI.  Notable that patient started radiation therapy for his brain on 10-29  2.  Antithrombotics: -DVT/anticoagulation:  Pharmaceutical: Lovenox             -antiplatelet therapy: N/A  3. Pain Management/spasms: percocet prn.   - 10-24: Extreme difficulty tolerating therapies due to back and lower hip pain and spasms with mobility.  baclofen to 10 mg 3 times daily, add tizanidine 2 mg 3 times daily, schedule OxyContin 10 mg every 12 hours, continue as needed Percocet 5 mg every 4 hours.  - 10-25: Increased OxyContin to 15 mg twice daily, increase baclofen to 15 mg 3 times daily; immediately notified next therapy session the patient was getting lethargic with these changes, so reverted to prior  - 10-26: Patient pain much improved on current regimen.  Unfortunately, appears to be somewhat sedated and euphoric on OxyContin 10 mg twice daily.  Not using as needed oxycodone,  will instead schedule 5 mg 2 times daily before therapies in addition to as needed and DC long-acting.   - 10/27: perhaps a little more pain today but cognitively seemed more appropriate compared to what's recorded- no changes to regimen  10-28: Pain under better control, continues with spasms but seems to be doing better overall.  10-30: Add Elavil 25 mg nightly for peripheral nerve pain at nighttime; already optimized on duloxetine, Lyrica  - 10-31: DC Elavil; did work well for pain, once causes of encephalopathy treated as below May retrial.  Reduce baclofen to 5 mg 3 times daily due to AKI.  4. Mood/Behavior/Sleep: LCSW to follow for evaluation and support             --continue ambien prn.              -antipsychotic agents: N/A  - 10-29: Developing some confusion overnight.  Schedule Ambien as he is using it nightly, 5 mg.  Add as needed trazodone 50 mg nightly.  - 10-30: No reports of confusion overnight.  Dors is sleeping well.  - 10-31: Grossly encephalopathic today, workup reveals leukocytosis of 15, AKI 1.69, and positive UA.  Delirium precautions, treatment as below.   5. Neuropsych/cognition: This patient may be intermittently  capable of making decisions on his own behalf. 6. Skin/Wound Care: Routine pressure relief measures.   10/27 dc staples today  7. Fluids/Electrolytes/Nutrition: Monitor I/O.   8.  Hyperglycemia due to steroids for lung cancer w/brain mets: XRT started today and at end of day as able to help with  tolerance of therapy             --on  Decadron 4 mg TID and fasting BS 120-150 range.  --Will check A1c in am--5.7, prediabetic - Fair control, DC blood glucose checks Recent Labs    05/22/24 1130 05/22/24 1723 05/23/24 1139  GLUCAP 127* 116* 150*      9. COPD: On Singulair with albuterol prn.  10. HTN: Monitor BP TID---on avapro and cardura.   - Monitor with therapies today  - 10-25: Increase Cardura for urinary retention to 3 mg nightly  - 10-26: Blood  pressures trending low; reduce Avapro 300 to 150 mg daily.  May need to reduce Cardura again if BP does not tolerate.  10/27 hold Avapro--BP improved 10-28--monitor on current regimen  10-31: Hypotension likely secondary to SIRS, give 1 L IV fluid bolus today, may need more fluids through the weekend.  Hold Cardura until BP improves.    05/23/2024    1:31 PM 05/23/2024   10:16 AM 05/23/2024    6:10 AM  Vitals with BMI  Systolic 87 102 107  Diastolic 71 70 74  Pulse 88 103 99    11. CAD: On Avapro and statin. Monitor for symptoms with increase in activity.              --continue to hold spironolactone and torsemide.    - weights stable Filed Weights   05/15/24 1727 05/16/24 1544 05/17/24 0504  Weight: 91.1 kg 91.1 kg 91 kg    12.  Hyponatremia: Na 132 @ admission-->131.  --Recheck BMET in am--stable 10/27 stable at 132 10-31: 131 today, likely secondary to AKI as below.  13. Acute on chronic renal failure: Encourage fluid intake.  BUN/SCr 27/1.32-->61/1.38 -- Monitor potassium levels as baseline 4.9-5.0-->continue ARB for now.  - Recheck CMET in am--improving BUN 37, creatinine 1.14, potassium 4.5 -10/27 BUN/Cr back up to 62/1.6 today, k+ 5.1 - hold avapro for now, push fluids and recheck in AM 10-28: BUN down to 47, creatinine normalized 1.09.  Continue to encourage p.o. fluids. 10-31: Recurrent AKI today, creatinine 1.69.  NA 131, potassium 5.2.  Give 1 L IV fluids, reduce baclofen as above, repeat BMP tonight, depending on results may give additional 1 L.  Repeat BMP in AM.  14.  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  - Baclofen 10 mg 3 times daily  - Tizanidine 2 mg 3 times daily  - 10-25: Start dantrolene 25 mg daily, after 1 week test LFTs and increase to 3 times daily if tolerated.  Add clonazepam 0.5 mg 3 times daily as needed for myoclonus.  May need to add VPA if myoclonus worsens.  10-26/27: Spasms much better controlled, has  not needed to use clonazepam yet.  Trend.  10-28: Start low-dose Keppra 500 mg twice daily for upper extremity myoclonus--notable improvement 10/29!  10/31: More myoclonus noted today, complicated by AKI and likely UTI as below.  Treat underlying causes, if continues to be persistent would recommend EEG over the weekend.  15.  Neurogenic bladder/urinary retention.  Likely secondary to cauda equina involvement, flaccid bladder.  - Ongoing requirement of intermittent straight cathing; increase Cardura to 3 mg nightly  - 10-26: Added Urecholine 10 mg 3 times daily--if no response with up titration/intolerable side effects, would transition towards patient learning intermittent straight cathing   -10/27 cathed for 800cc this am.    -urecholine just added, consider increase in AM to 25mg  if still retaining   -  encouraged up to [facility] or toilet to empty  - 10-28: Increase frequency of cath to every 4 hours due to large volumes, increase Urecholine to 25 mg 3 times daily.  If no improvement with these measures, will need to drain patient on straight cathing versus place Foley based on upper extremity coordination and bladder volumes greater than 2.5 L/day.  - 10-29: Straight caths volumes better.  Continue Urecholine through today, renal ultrasound for structural cause assessment.  If abdominal cramping from Urecholine intolerable, will DC and discuss placement of Foley catheter  -10-30: Long discussion with patient, his wife regarding likely flaccid bladder secondary to cauda equina involvement.  Patient not agreeable to learning straight cathing at this time, wants Foley placed on discharge; will continue to discuss this with them  10-31: UTI associated with urinary retention, DC bethanechol.  Reducing Cardura due to hypotension as above.  Called patient's wife to discuss Foley placement given IV fluids and already currently needing every 4 hour caths to avoid high volumes, went to voicemail, will need her  consent before placement.  16.  Constipation/diarrhea.  - 10-29: Liquid bowel movements yesterday after sorbitol.  Move MiraLAX to as needed, reduce Senokot S to nightly.  Monitor today due to possibility of abdominal cramping with Urecholine.  - 10-30: Patient endorsing constipation, will increase Senokot S2 1 tab twice daily and MiraLAX daily  10-31: No bowel movement, will give sorbitol 1 back tonight.  17.  Leukocytosis, likely secondary to urinary tract infection  - Start ceftriaxone 2 g daily; follow cultures  - DC bethanechol as above  - Will discuss with wife Foley placement given already need for frequent caths, IV fluid as above  - Pending results of checks x-ray given cough to look for alternate causes  LOS: 8 days A FACE TO FACE EVALUATION WAS PERFORMED  Joesph JAYSON Likes 05/23/2024, 2:50 PM

## 2024-05-23 NOTE — Progress Notes (Signed)
 PM labs with Bun 66, Cr unchanged. BP remains soft. CXR with findings suspicious of aspiration pneumonia.   Patient has gotten 1 L IVF; will give additional 1 L NS and follow with continuous 75 cc/hr. Rate adjustment per weekend team with repeat BMP in AM.  Has received 2G rocephin for UTI - should also cover aspiration pneumonia. Monitor vitals and urine culture.   Gave wife and patient option of placing foley, given high volume caths resulting in q4h caths already and considerable increase in fluids will likely worsen this. Patient has had no continent voids since admission on optimized medical therapy; bethenachol Dced due to now subsequent UTI. Patient and wife discussed with nursing and wish to go this route; place foley order initiated after that of antibiotics.    Roger JAYSON Likes, DO 05/23/2024

## 2024-05-23 NOTE — Progress Notes (Signed)
 Order for [employer], xR, and PDL1 placed on recent brain biopsy sample [phone].

## 2024-05-23 NOTE — Progress Notes (Signed)
 Patient ID: Nichols Corter, male   DOB: 04/29/1956, 68 y.o.   MRN: [id number]   [id number] left message for pt wife requesting return phone call to confirm family edu for Mon or Tues 1pm-4pm.   Graeme Jude, MSW, LCSW Office: [phone] Cell: [phone] Fax: [phone]

## 2024-05-23 NOTE — Progress Notes (Signed)
 Physical Therapy Note  Patient Details  Name: Raphael Fitzpatrick MRN: [id number] Date of Birth: 03-Sep-1955 Today's Date: 05/23/2024    Pt missed 30 min skilled PT due to lethargy. Upon pt entry pt sleeping soundly. PTA requiring significant noxious stimuli to arouse pt with pt able to open eyes. Pt then closed eyes again and fell asleep. PTA made several additional attempts to arouse pt however pt unable to stay awake. Will continue efforts as schedule permits.     Lazar Tierce 05/23/2024, 1:23 PM

## 2024-05-23 NOTE — Progress Notes (Signed)
 Physical Therapy Weekly Progress Note  Patient Details  Name: Roger Black MRN: [id number] Date of Birth: Mar 12, 1956  Beginning of progress report period: May 16, 2024 End of progress report period: May 23, 2024  Today's Date: 05/23/2024 PT Individual Time: 9195-9083 PT Individual Time Calculation (min): 72 min   Patient has met 2 of 4 short term goals. Pt is progressing toward mobility goals, improving independence with transfers and ambulation, though with fluctuating presentation and at time requiring increased assistance. Pt at times has myoclonic jerking of BUEs and buckling of BLEs, which impair mobility and safety. Pt's cognition and level of alertness has also fluctuated during reporting period, impairing progress. Pt will benefit from hands on family ed prior to DC.  Patient continues to demonstrate the following deficits muscle weakness, decreased cardiorespiratoy endurance, motor apraxia, ataxia, and decreased coordination, decreased attention, decreased awareness, decreased problem solving, and decreased safety awareness, and decreased sitting balance, decreased standing balance, decreased postural control, and decreased balance strategies and therefore will continue to benefit from skilled PT intervention to increase functional independence with mobility.  Patient progressing toward long term goals..  Continue plan of care.  PT Short Term Goals Week 1:  PT Short Term Goal 1 (Week 1): Pt will complete bed mobility with minA consistently. PT Short Term Goal 1 - Progress (Week 1): Met PT Short Term Goal 2 (Week 1): Pt will complete sit to stand with minA consistently. PT Short Term Goal 2 - Progress (Week 1): Progressing toward goal PT Short Term Goal 3 (Week 1): Pt will complete bed to chair transfer with minA consistently. PT Short Term Goal 3 - Progress (Week 1): Progressing toward goal PT Short Term Goal 4 (Week 1): Pt will ambulate x100' with minA and LRAD. PT  Short Term Goal 4 - Progress (Week 1): Met Week 2:  PT Short Term Goal 1 (Week 2): STGs = LTGs  Skilled Therapeutic Interventions/Progress Updates:  (P) Ambulation/gait training;Community reintegration;DME/adaptive equipment instruction;Neuromuscular re-education;Psychosocial support;Stair training;UE/LE Strength taining/ROM;Wheelchair propulsion/positioning;Balance/vestibular training;Discharge planning;Functional electrical stimulation;Pain management;Skin care/wound management;Therapeutic Activities;UE/LE Coordination activities;Cognitive remediation/compensation;Disease management/prevention;Functional mobility training;Patient/family education;Splinting/orthotics;Therapeutic Exercise;Visual/perceptual remediation/compensation   Pt received semi reclined in bed with RN in room attempting to arouse pt. Pt is very lethargic and present with increased confusion this morning. Dr. Emeline informed of pt's status. Per DO, presentation could be associated with sleep medication and UTI. Pt noted to have pain in both hips. PT provides gentle mobility and rest breaks to manage pain. Pt performs supine to sit with maxA to facilitate logrolling and sequencing, in hopes of increased arousal with upright positioning. Pt seated at EOB in short sitting with breakfast and is unable to hold items on tray due to myoclonic jerking in BUEs. Pt performs stand pivot tranfser form bd to WC with maxA and facilitation of anterior weight shifting, sequenicng, and positioning. WC transport to gym. Pt performs stand step transfer from [facility] to [place] with RW and modA, with cues for posture, safety, sequencing, and positioning. [place] completed to challenge attention to task, provide gentle ROM of upper and lower extremities, and provide endurance challenge. Pt completes x8:00 at workload of 4 with average steps per minute ~25. Pt has inconsistent participation, frequently closing eyes and requiring rest breaks. During transfer from [place]  back to [facility], pt has simultaneous buckling of BUEs and BLEs, causing pt to to have assisted fall, with PT utilizing gait belt to lower pt to ground and preventing harm to pt. RN, DO, and PA arrive moments after  fall to assess pt, who appears unharmed other than being emotionally shaken up from experience. Dependent +3 assist used to lift pt from ground back to [facility]. Pt left with RN and DO in dayroom.   Therapy Documentation Precautions:  Precautions Precautions: Fall Precaution/Restrictions Comments: BP <150 Restrictions Weight Bearing Restrictions Per Provider Order: No  Therapy/Group: Individual Therapy  Elsie JAYSON Dawn, PT, DPT 05/23/2024, 3:13 PM

## 2024-05-23 NOTE — Progress Notes (Signed)
 Occupational Therapy Session Note  Patient Details  Name: Roger Black MRN: [id number] Date of Birth: [birth year]  Today's Date: 05/23/2024 OT Missed Time: 30 Minutes Missed Time Reason: Patient fatigue   Short Term Goals: Week 2:     Skilled Therapeutic Interventions/Progress Updates:      Therapy Documentation Precautions:  Precautions Precautions: Fall Precaution/Restrictions Comments: BP <150 Restrictions Weight Bearing Restrictions Per Provider Order: No General: Pt supine in bed upon OT arrival asleep. OT spent 10-15 minutes attempting to wake pt for therapies. Pt intermittently wake up and mutter words then falling back asleep. Pt unable to participate in therapies d/t increased fatigue. Will attempt to make up missed time as pt schedule allows.    Pt supine in bed with bed alarm activated, 3 bed rails up, call light within reach and 4Ps assessed.   Therapy/Group: Individual Therapy  Camie Hoe, OTD, OTR/L 05/23/2024, 4:00 PM

## 2024-05-23 NOTE — Progress Notes (Signed)
   05/23/24 1407  Spiritual Encounters  Type of Visit Attempt (pt unavailable)  Care provided to: Patient  Reason for visit Routine spiritual support  OnCall Visit No   Chaplain stopped by to visit Pt. Pt was sitting up in bed with his meal tray nearby, appearing to have fallen asleep. Chaplain knocked on the door a couple of time and softly called the Pt's name to try to walk him.  Pt. Did not respond and appeared to be resting comfortably. Chaplain services remain available for emotional and spiritual support as needed.

## 2024-05-24 DIAGNOSIS — J690 Pneumonitis due to inhalation of food and vomit: Secondary | ICD-10-CM

## 2024-05-24 DIAGNOSIS — D72829 Elevated white blood cell count, unspecified: Secondary | ICD-10-CM

## 2024-05-24 DIAGNOSIS — K5900 Constipation, unspecified: Secondary | ICD-10-CM

## 2024-05-24 DIAGNOSIS — N3000 Acute cystitis without hematuria: Secondary | ICD-10-CM

## 2024-05-24 LAB — CBC WITH DIFFERENTIAL/PLATELET
Abs Immature Granulocytes: 0.08 K/uL — ABNORMAL HIGH (ref 0.00–0.07)
Basophils Absolute: 0 K/uL (ref 0.0–0.1)
Basophils Relative: 0 %
Eosinophils Absolute: 0 K/uL (ref 0.0–0.5)
Eosinophils Relative: 0 %
HCT: 40.4 % (ref 39.0–52.0)
Hemoglobin: 13.3 g/dL (ref 13.0–17.0)
Immature Granulocytes: 1 %
Lymphocytes Relative: 2 %
Lymphs Abs: 0.2 K/uL — ABNORMAL LOW (ref 0.7–4.0)
MCH: 32.5 pg (ref 26.0–34.0)
MCHC: 32.9 g/dL (ref 30.0–36.0)
MCV: 98.8 fL (ref 80.0–100.0)
Monocytes Absolute: 0.2 K/uL (ref 0.1–1.0)
Monocytes Relative: 2 %
Neutro Abs: 9.1 K/uL — ABNORMAL HIGH (ref 1.7–7.7)
Neutrophils Relative %: 95 %
Platelets: 95 K/uL — ABNORMAL LOW (ref 150–400)
RBC: 4.09 MIL/uL — ABNORMAL LOW (ref 4.22–5.81)
RDW: 12.6 % (ref 11.5–15.5)
WBC: 9.6 K/uL (ref 4.0–10.5)
nRBC: 0 % (ref 0.0–0.2)

## 2024-05-24 LAB — BASIC METABOLIC PANEL WITH GFR
Anion gap: 11 (ref 5–15)
BUN: 52 mg/dL — ABNORMAL HIGH (ref 8–23)
CO2: 22 mmol/L (ref 22–32)
Calcium: 8.6 mg/dL — ABNORMAL LOW (ref 8.9–10.3)
Chloride: 101 mmol/L (ref 98–111)
Creatinine, Ser: 1.16 mg/dL (ref 0.61–1.24)
GFR, Estimated: >60 mL/min (ref >60)
Glucose, Bld: 107 mg/dL — ABNORMAL HIGH (ref 70–99)
Potassium: 5 mmol/L (ref 3.5–5.1)
Sodium: 134 mmol/L — ABNORMAL LOW (ref 135–145)

## 2024-05-24 MED ORDER — CHLORHEXIDINE GLUCONATE CLOTH 2 % EX PADS
6.0000 | MEDICATED_PAD | Freq: Two times a day (BID) | CUTANEOUS | Status: DC
  Administered 2024-05-24 – 2024-05-30 (×14): 6 via TOPICAL

## 2024-05-24 NOTE — Progress Notes (Signed)
 PROGRESS NOTE   Subjective/Complaints: Reports pain under control overall with oxycodone.    LBM 10/31  ROS:  Neg for Chest pain or SOB Positive for cough, constipation, urinary retention, and bilateral hip pain/spasms, bilateral foot sensory reduction   Objective:   DG CHEST PORT 1 VIEW Result Date: 05/23/2024 CLINICAL DATA:  Cough EXAM: PORTABLE CHEST 1 VIEW COMPARISON:  CT chest dated 05/06/2024 FINDINGS: Normal lung volumes. Bibasilar hazy and patchy opacities, right-greater-than-left. Bilateral upper lung lucency. No pleural effusion or pneumothorax. The heart size and mediastinal contours are within normal limits. No acute osseous abnormality. IMPRESSION: 1. Bibasilar hazy and patchy opacities, right-greater-than-left, suspicious for aspiration or pneumonia. 2. Bilateral upper lung lucency, likely related to emphysema. Electronically Signed   By: Limin  Xu M.D.   On: 05/23/2024 17:38    Recent Labs    05/23/24 0933 05/24/24 0617  WBC 15.6* 9.6  HGB 14.8 13.3  HCT 44.7 40.4  PLT 119* 95*   Recent Labs    05/23/24 2050 05/24/24 0617  NA 132* 134*  K 4.2 5.0  CL 99 101  CO2 21* 22  GLUCOSE 142* 107*  BUN 66* 52*  CREATININE 1.62* 1.16  CALCIUM 8.8* 8.6*    Intake/Output Summary (Last 24 hours) at 05/24/2024 1800 Last data filed at 05/24/2024 1456 Gross per 24 hour  Intake 1107.25 ml  Output 1500 ml  Net -392.75 ml        Physical Exam: Vital Signs Blood pressure 110/67, pulse 90, temperature 97.8 F (36.6 C), resp. rate 18, height 5' 8 (1.727 m), weight 91 kg, SpO2 95%.   Constitutional: No distress . Vital signs reviewed. Sitting in bed HEENT: NCAT, EOMI, oral membranes moist. Neck: supple, no JVD. Cardiovascular: RRR without murmur. No JVD    Respiratory/Chest: Wet sounding, central cough on expiration.  No production.  Clear to auscultation in the bases and peripheral fields.    GI/Abdomen:  BS + hypoactive, non-tender, mildly-distended Ext: no clubbing, cyanosis, or edema Psych: Restless, confused- sounds like improved from prior Skin: C/D/I. No apparent lesions. + Craniotomy-staples removed, well-approximated + PIV c/d/i  MSK:      No apparent deformity.  Moving all 4 limbs.       Neurologic exam:  Cognition: AAO to person, place, time with extended time for recall + Impulsive, poor awareness  + tangential   Insight: Poor insight into current condition--usually has good insight. Sensation: intact BL UE; reduced in bilateral lower extremities Reflexes: 2+ in BL UE and 0 BL LEs. Negative Hoffman's and babinski signs bilaterally.   CN: L eye difficulty converging, otherwise intact--no change Coordination:  Bilateral upper extremity loss of tone in hands with intention  Spasticity: MAS 0 in all extremities.          Strength: Unchanged on exam 10-31                LUE: 5/5 SA, 5/5 EF, 5/5 EE, 5/5 WE, 5/5 FF, 5/5 FA                RUE:  5/5 SA, 5/5 EF, 5/5 EE, 5/5 WE, 5/5 FF, 5/5 FA  LLE: 3/5 HF, 4/5 KE, 4/5  DF, 4/5  EHL, 4/5  PF                 RLE: 3-/5 HF, 3/5 KE, 1/5  DF, 1/5  EHL, 1/5  PF     Assessment/Plan: 1. Functional deficits which require 3+ hours per day of interdisciplinary therapy in a comprehensive inpatient rehab setting. Physiatrist is providing close team supervision and 24 hour management of active medical problems listed below. Physiatrist and rehab team continue to assess barriers to discharge/monitor patient progress toward functional and medical goals  Care Tool:  Bathing    Body parts bathed by patient: Right arm, Left arm, Abdomen, Chest, Front perineal area, Right upper leg, Left upper leg, Right lower leg, Left lower leg, Face   Body parts bathed by helper: Buttocks     Bathing assist Assist Level: Moderate Assistance - Patient 50 - 74%     Upper Body Dressing/Undressing Upper body dressing   What is the patient  wearing?: Pull over shirt    Upper body assist Assist Level: Contact Guard/Touching assist    Lower Body Dressing/Undressing Lower body dressing      What is the patient wearing?: Underwear/pull up, Pants     Lower body assist Assist for lower body dressing: Moderate Assistance - Patient 50 - 74%     Toileting Toileting    Toileting assist Assist for toileting: Moderate Assistance - Patient 50 - 74%     Transfers Chair/bed transfer  Transfers assist     Chair/bed transfer assist level: Minimal Assistance - Patient > 75%     Locomotion Ambulation   Ambulation assist      Assist level: Moderate Assistance - Patient 50 - 74% Assistive device: No Device Max distance: 15'   Walk 10 feet activity   Assist     Assist level: Moderate Assistance - Patient - 50 - 74% Assistive device: No Device   Walk 50 feet activity   Assist Walk 50 feet with 2 turns activity did not occur: Safety/medical concerns         Walk 150 feet activity   Assist Walk 150 feet activity did not occur: Safety/medical concerns         Walk 10 feet on uneven surface  activity   Assist Walk 10 feet on uneven surfaces activity did not occur: Safety/medical concerns         Wheelchair     Assist Is the patient using a wheelchair?: Yes Type of Wheelchair: Manual    Wheelchair assist level: Dependent - Patient 0% Max wheelchair distance: 150'    Wheelchair 50 feet with 2 turns activity    Assist        Assist Level: Dependent - Patient 0%   Wheelchair 150 feet activity     Assist      Assist Level: Dependent - Patient 0%   Blood pressure 110/67, pulse 90, temperature 97.8 F (36.6 C), resp. rate 18, height 5' 8 (1.727 m), weight 91 kg, SpO2 95%.  Medical Problem List and Plan: 1. 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 Daughter will be moving in with them. Myoclonus in [place] remains challenging; Min A ADLs overall min-mod for transfers. Amb 15 ft with RW limited by pain. Mild-moderate cognitive deficits variable depending on XRT.   - 10-31: More confused today, hypotensive, leukocytosis of 15 concerning for infection.  Most likely cause is urinary given retention, but will also check chest x-ray given cough.  IV fluids for AKI.  Notable that patient started radiation therapy for his brain on 10-29  -11/1-  consider PRAFO for foot drop,   2.  Antithrombotics: -DVT/anticoagulation:  Pharmaceutical: Lovenox             -antiplatelet therapy: N/A  3. Pain Management/spasms: percocet prn.   - 10-24: Extreme difficulty tolerating therapies due to back and lower hip pain and spasms with mobility.  baclofen to 10 mg 3 times daily, add tizanidine 2 mg 3 times daily, schedule OxyContin 10 mg every 12 hours, continue as needed Percocet 5 mg every 4 hours.  - 10-25: Increased OxyContin to 15 mg twice daily, increase baclofen to 15 mg 3 times daily; immediately notified next therapy session the patient was getting lethargic with these changes, so reverted to prior  - 10-26: Patient pain much improved on current regimen.  Unfortunately, appears to be somewhat sedated and euphoric on OxyContin 10 mg twice daily.  Not using as needed oxycodone, will instead schedule 5 mg 2 times daily before therapies in addition to as needed and DC long-acting.   - 10/27: perhaps a little more pain today but cognitively seemed more appropriate compared to what's recorded- no changes to regimen  10-28: Pain under better control, continues with spasms but seems to be doing better overall.  10-30: Add Elavil 25 mg nightly for peripheral nerve  pain at nighttime; already optimized on duloxetine, Lyrica  - 10-31: DC Elavil; did work well for pain, once causes of encephalopathy treated as below May retrial.  Reduce baclofen to 5 mg 3 times daily due to AKI.  -11/1 Reports continued back pain, overall under control with oxycodone  4. Mood/Behavior/Sleep: LCSW to follow for evaluation and support             --continue ambien prn.              -antipsychotic agents: N/A  - 10-29: Developing some confusion overnight.  Schedule Ambien as he is using it nightly, 5 mg.  Add as needed trazodone 50 mg nightly.  - 10-30: No reports of confusion overnight.  Dors is sleeping well.  - 10-31: Grossly encephalopathic today, workup reveals leukocytosis of 15, AKI 1.69, and positive UA.  Delirium precautions, treatment as below.   5. Neuropsych/cognition: This patient may be intermittently  capable of making decisions on his own behalf. 6. Skin/Wound Care: Routine pressure relief measures.   10/27 dc staples today  7. Fluids/Electrolytes/Nutrition: Monitor I/O.   8.  Hyperglycemia due to steroids for lung cancer w/brain mets: XRT started today and at end of day as able to help with tolerance of therapy             --on  Decadron 4 mg TID and fasting BS 120-150 range.  --Will check A1c in am--5.7, prediabetic - Fair control, DC blood glucose checks Recent Labs    05/22/24 1130 05/22/24 1723 05/23/24 1139  GLUCAP 127* 116* 150*      9. COPD: On Singulair with albuterol prn.  10. HTN: Monitor BP TID---on avapro and cardura.   - Monitor with therapies today  - 10-25: Increase Cardura for urinary retention to 3 mg nightly  - 10-26: Blood pressures trending low; reduce Avapro 300 to 150 mg daily.  May need to reduce Cardura again if BP does not tolerate.  10/27 hold Avapro--BP improved 10-28--monitor on current regimen  10-31: Hypotension likely secondary to SIRS, give 1 L IV fluid bolus today, may need more fluids through the weekend.  Hold  Cardura until BP improves.    05/24/2024    3:48 PM 05/24/2024    8:30 AM 05/24/2024    4:57 AM  Vitals with BMI  Systolic 110 111 867  Diastolic 67 74 78  Pulse 90 88 73    11. CAD: On Avapro and statin. Monitor for symptoms with increase in activity.              --continue to hold spironolactone and torsemide.    - weights stable Filed Weights   05/15/24 1727 05/16/24 1544 05/17/24 0504  Weight: 91.1 kg 91.1 kg 91 kg    12.  Hyponatremia: Na 132 @ admission-->131.  --Recheck BMET in am--stable 10/27 stable at 132 10-31: 131 today, likely secondary to AKI as below.  13. Acute on chronic renal failure: Encourage fluid intake.  BUN/SCr 27/1.32-->61/1.38 -- Monitor potassium levels as baseline 4.9-5.0-->continue ARB for now.  - Recheck CMET in am--improving BUN 37, creatinine 1.14, potassium 4.5 -10/27 BUN/Cr back up to 62/1.6 today, k+ 5.1 - hold avapro for now, push fluids and recheck in AM 10-28: BUN down to 47, creatinine normalized 1.09.  Continue to encourage p.o. fluids. 10-31: Recurrent AKI today, creatinine 1.69.  NA 131, potassium 5.2.  Give 1 L IV fluids, reduce baclofen as above, repeat BMP tonight, depending on results may give additional 1 L.  Repeat BMP in AM. 11/1 BUN and Cr improved to 52/1.16  14.  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  - Baclofen 10 mg 3 times daily  - Tizanidine 2 mg 3 times daily  - 10-25: Start dantrolene 25 mg daily, after 1 week test LFTs and increase to 3 times daily if tolerated.  Add clonazepam 0.5 mg 3 times daily as needed for myoclonus.  May need to add VPA if myoclonus worsens.  10-26/27: Spasms much better controlled, has not needed to use clonazepam yet.  Trend.  10-28: Start low-dose Keppra 500 mg twice daily for upper extremity myoclonus--notable improvement 10/29!  10/31: More myoclonus noted today, complicated by AKI and likely UTI as below.  Treat underlying causes, if continues to be  persistent would recommend EEG over the weekend.  -11/1 Did not note myoclonus today,continue to monitor  15.  Neurogenic bladder/urinary retention.  Likely secondary to cauda equina involvement, flaccid bladder.  - Ongoing requirement of intermittent straight cathing; increase Cardura to 3 mg nightly  - 10-26: Added Urecholine 10 mg 3 times daily--if no response with up titration/intolerable side effects, would transition towards patient learning intermittent straight cathing   -10/27 cathed for 800cc this am.    -urecholine just added, consider increase in AM to 25mg  if still retaining   -encouraged up to [facility] or toilet to empty  - 10-28: Increase frequency of cath to every 4 hours due to large volumes, increase Urecholine to 25 mg 3 times daily.  If no improvement with these measures, will need to drain patient on straight cathing versus place Foley based  on upper extremity coordination and bladder volumes greater than 2.5 L/day.  - 10-29: Straight caths volumes better.  Continue Urecholine through today, renal ultrasound for structural cause assessment.  If abdominal cramping from Urecholine intolerable, will DC and discuss placement of Foley catheter  -10-30: Long discussion with patient, his wife regarding likely flaccid bladder secondary to cauda equina involvement.  Patient not agreeable to learning straight cathing at this time, wants Foley placed on discharge; will continue to discuss this with them  10-31: UTI associated with urinary retention, DC bethanechol.  Reducing Cardura due to hypotension as above.  Called patient's wife to discuss Foley placement given IV fluids and already currently needing every 4 hour caths to avoid high volumes, went to voicemail, will need her consent before placement.  11/1 foley was started, continue ROcephin  16.  Constipation/diarrhea.  - 10-29: Liquid bowel movements yesterday after sorbitol.  Move MiraLAX to as needed, reduce Senokot S to nightly.   Monitor today due to possibility of abdominal cramping with Urecholine.  - 10-30: Patient endorsing constipation, will increase Senokot S2 1 tab twice daily and MiraLAX daily  10-31: No bowel movement, will give sorbitol 1 back tonight.  11/1 LBM yesterday  17.  Leukocytosis, likely secondary to urinary tract infection  - Start ceftriaxone 2 g daily; follow cultures  - DC bethanechol as above  - Will discuss with wife Foley placement given already need for frequent caths, IV fluid as above  - Pending results of checks x-ray given cough to look for alternate causes  -11/1 WBC back to 9.6   18. Aspiration pneumonia  -Continue rocephin  LOS: 9 days A FACE TO FACE EVALUATION WAS PERFORMED  Murray Collier 05/24/2024, 6:00 PM

## 2024-05-24 NOTE — Progress Notes (Signed)
 Physical Therapy Session Note  Patient Details  Name: Roger Black MRN: [id number] Date of Birth: 28-Aug-1955  Today's Date: 05/24/2024 PT Individual Time: 8573-8494 PT Individual Time Calculation (min): 39 min   Short Term Goals: Week 1:  PT Short Term Goal 1 (Week 1): Pt will complete bed mobility with minA consistently. PT Short Term Goal 1 - Progress (Week 1): Met PT Short Term Goal 2 (Week 1): Pt will complete sit to stand with minA consistently. PT Short Term Goal 2 - Progress (Week 1): Progressing toward goal PT Short Term Goal 3 (Week 1): Pt will complete bed to chair transfer with minA consistently. PT Short Term Goal 3 - Progress (Week 1): Progressing toward goal PT Short Term Goal 4 (Week 1): Pt will ambulate x100' with minA and LRAD. PT Short Term Goal 4 - Progress (Week 1): Met Week 2:  PT Short Term Goal 1 (Week 2): STGs = LTGs  Skilled Therapeutic Interventions/Progress Updates:  Patient supine in bed and asleep on entrance to room. Patient requires time to rouse and become fully alert. Not initially agreeable to PT session.   Patient with LLE>RLE pain complaint at start of session. Unrated.   Pt relates recent medical hx ending with fall in therapy room yesterday. Is unsure what he can do this day. Attempted to bring BLEs off EOB but unable to complete. Then instead performed BLE supine therex with AAROM for SLR, heel slides with push into extension, SL hip abd/ add, lift to 90/ 90, ankle mobility. Also attempted bridging but minimal lift noted. Pt upset with lack of ankle/ toe mobility in LLE.   Patient supine in bed at end of session with brakes locked, bed alarm set, and all needs within reach. Wife present.    Therapy Documentation Precautions:  Precautions Precautions: Fall Precaution/Restrictions Comments: BP <150 Restrictions Weight Bearing Restrictions Per Provider Order: No Pain:  Min to Mod pain complaint this session. RN present at end of session  with pain meds to address.   Therapy/Group: Individual Therapy  Mliss DELENA Milliner PT, DPT, CSRS 05/24/2024, 7:13 AM

## 2024-05-25 LAB — URINE CULTURE: Culture: >=100000 — AB

## 2024-05-25 LAB — CBC
HCT: 37.8 % — ABNORMAL LOW (ref 39.0–52.0)
Hemoglobin: 12.7 g/dL — ABNORMAL LOW (ref 13.0–17.0)
MCH: 32.5 pg (ref 26.0–34.0)
MCHC: 33.6 g/dL (ref 30.0–36.0)
MCV: 96.7 fL (ref 80.0–100.0)
Platelets: 93 K/uL — ABNORMAL LOW (ref 150–400)
RBC: 3.91 MIL/uL — ABNORMAL LOW (ref 4.22–5.81)
RDW: 12.6 % (ref 11.5–15.5)
WBC: 6.2 K/uL (ref 4.0–10.5)
nRBC: 0 % (ref 0.0–0.2)

## 2024-05-25 LAB — BASIC METABOLIC PANEL WITH GFR
Anion gap: 9 (ref 5–15)
BUN: 33 mg/dL — ABNORMAL HIGH (ref 8–23)
CO2: 20 mmol/L — ABNORMAL LOW (ref 22–32)
Calcium: 8.2 mg/dL — ABNORMAL LOW (ref 8.9–10.3)
Chloride: 101 mmol/L (ref 98–111)
Creatinine, Ser: 0.96 mg/dL (ref 0.61–1.24)
GFR, Estimated: >60 mL/min (ref >60)
Glucose, Bld: 158 mg/dL — ABNORMAL HIGH (ref 70–99)
Potassium: 4.5 mmol/L (ref 3.5–5.1)
Sodium: 130 mmol/L — ABNORMAL LOW (ref 135–145)

## 2024-05-25 MED ORDER — SORBITOL 70 % SOLN
30.0000 mL | Freq: Once | Status: AC
  Administered 2024-05-25: 30 mL via ORAL
  Filled 2024-05-25: qty 30

## 2024-05-25 NOTE — Progress Notes (Signed)
 Orthopedic Tech Progress Note Patient Details:  Roger Black [birth year] [id number]  Ortho Devices Type of Ortho Device: Prafo boot/shoe Ortho Device/Splint Location: LLE Ortho Device/Splint Interventions: Ordered, Application      Laymon Roger Black 05/25/2024, 3:13 PM

## 2024-05-25 NOTE — Progress Notes (Signed)
 PROGRESS NOTE   Subjective/Complaints: Reports pain is controlled. He feels a little constipated. Pt reports last BM 2 days ago.   ROS:  Neg for Chest pain or SOB Positive for cough,  urinary retention, and bilateral hip pain/spasms, bilateral foot sensory reduction + constipation  Objective:   No results found.   Recent Labs    05/23/24 0933 05/24/24 0617  WBC 15.6* 9.6  HGB 14.8 13.3  HCT 44.7 40.4  PLT 119* 95*   Recent Labs    05/23/24 2050 05/24/24 0617  NA 132* 134*  K 4.2 5.0  CL 99 101  CO2 21* 22  GLUCOSE 142* 107*  BUN 66* 52*  CREATININE 1.62* 1.16  CALCIUM 8.8* 8.6*    Intake/Output Summary (Last 24 hours) at 05/25/2024 1309 Last data filed at 05/25/2024 0805 Gross per 24 hour  Intake 836 ml  Output 2700 ml  Net -1864 ml        Physical Exam: Vital Signs Blood pressure 131/88, pulse 73, temperature 97.7 F (36.5 C), temperature source Oral, resp. rate 16, height 5' 8 (1.727 m), weight 91 kg, SpO2 99%.   Constitutional: No distress . Vital signs reviewed. Sitting in bed, appears comfortable HEENT: NCAT, EOMI, oral membranes moist. Neck: supple, no JVD. Cardiovascular: RRR without murmur. No JVD    Respiratory/Chest: Wet sounding, central cough on expiration.  No production.  Clear to auscultation in the bases and peripheral fields.    GI/Abdomen: BS + hypoactive, non-tender, mildly-distended Ext: no clubbing, cyanosis, or edema Psych: Restless, confused- sounds like improved from prior Skin: C/D/I. No apparent lesions. + Craniotomy-staples removed, well-approximated + PIV c/d/i  MSK:      No apparent deformity.        Neurologic exam:  Cognition: AAO to person, place, time + decreased awareness  + tangential   Insight: Fair insight Sensation: intact BL UE; reduced in bilateral lower extremities  CN: L eye difficulty converging, otherwise intact--no change Coordination:   Bilateral upper extremity loss of tone in hands with intention  Spasticity: MAS 0 in all extremities.          Strength: Unchanged on exam 10-31                LUE: 5/5 SA, 5/5 EF, 5/5 EE, 5/5 WE, 5/5 FF, 5/5 FA                RUE:  5/5 SA, 5/5 EF, 5/5 EE, 5/5 WE, 5/5 FF, 5/5 FA                RLE: 3-/5 HF, 3/5 KE, 4-/5  DF, 4-/5                 LLE: 3/5 HF, 3/5 KE, 1/5  DF, 1/5     Assessment/Plan: 1. Functional deficits which require 3+ hours per day of interdisciplinary therapy in a comprehensive inpatient rehab setting. Physiatrist is providing close team supervision and 24 hour management of active medical problems listed below. Physiatrist and rehab team continue to assess barriers to discharge/monitor patient progress toward functional and medical goals  Care Tool:  Bathing    Body parts bathed by  patient: Right arm, Left arm, Abdomen, Chest, Front perineal area, Right upper leg, Left upper leg, Right lower leg, Left lower leg, Face   Body parts bathed by helper: Buttocks     Bathing assist Assist Level: Moderate Assistance - Patient 50 - 74%     Upper Body Dressing/Undressing Upper body dressing   What is the patient wearing?: Pull over shirt    Upper body assist Assist Level: Contact Guard/Touching assist    Lower Body Dressing/Undressing Lower body dressing      What is the patient wearing?: Underwear/pull up, Pants     Lower body assist Assist for lower body dressing: Moderate Assistance - Patient 50 - 74%     Toileting Toileting    Toileting assist Assist for toileting: Moderate Assistance - Patient 50 - 74%     Transfers Chair/bed transfer  Transfers assist     Chair/bed transfer assist level: Minimal Assistance - Patient > 75%     Locomotion Ambulation   Ambulation assist      Assist level: Moderate Assistance - Patient 50 - 74% Assistive device: No Device Max distance: 15'   Walk 10 feet activity   Assist     Assist level:  Moderate Assistance - Patient - 50 - 74% Assistive device: No Device   Walk 50 feet activity   Assist Walk 50 feet with 2 turns activity did not occur: Safety/medical concerns         Walk 150 feet activity   Assist Walk 150 feet activity did not occur: Safety/medical concerns         Walk 10 feet on uneven surface  activity   Assist Walk 10 feet on uneven surfaces activity did not occur: Safety/medical concerns         Wheelchair     Assist Is the patient using a wheelchair?: Yes Type of Wheelchair: Manual    Wheelchair assist level: Dependent - Patient 0% Max wheelchair distance: 150'    Wheelchair 50 feet with 2 turns activity    Assist        Assist Level: Dependent - Patient 0%   Wheelchair 150 feet activity     Assist      Assist Level: Dependent - Patient 0%   Blood pressure 131/88, pulse 73, temperature 97.7 F (36.5 C), temperature source Oral, resp. rate 16, height 5' 8 (1.727 m), weight 91 kg, SpO2 99%.  Medical Problem List and Plan: 1. 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 Daughter will be moving in with them. Myoclonus in [place] remains challenging; Min A ADLs overall min-mod for transfers. Amb 15 ft with RW limited by pain. Mild-moderate cognitive deficits variable depending on XRT.   - 10-31: More confused today, hypotensive, leukocytosis of 15 concerning for infection.  Most likely cause is urinary given retention, but will also check chest x-ray given cough.  IV fluids for AKI.  Notable that patient started radiation therapy for his brain on 10-29  -Confusion improving  -11/2PRAFO for left  foot  2.  Antithrombotics: -DVT/anticoagulation:  Pharmaceutical: Lovenox             -antiplatelet therapy: N/A  3. Pain Management/spasms: percocet prn.   - 10-24: Extreme difficulty tolerating therapies due to back and lower hip pain and spasms with mobility.  baclofen to 10 mg 3 times daily, add tizanidine 2 mg 3 times daily, schedule OxyContin 10 mg every 12 hours, continue as needed Percocet 5 mg every 4 hours.  - 10-25: Increased OxyContin to 15 mg twice daily, increase baclofen to 15 mg 3 times daily; immediately notified next therapy session the patient was getting lethargic with these changes, so reverted to prior  - 10-26: Patient pain much improved on current regimen.  Unfortunately, appears to be somewhat sedated and euphoric on OxyContin 10 mg twice daily.  Not using as needed oxycodone, will instead schedule 5 mg 2 times daily before therapies in addition to as needed and DC long-acting.   - 10/27: perhaps a little more pain today but cognitively seemed more appropriate compared to what's recorded- no changes to regimen  10-28: Pain under better control, continues with spasms but seems to be doing better overall.  10-30: Add Elavil 25 mg nightly for peripheral nerve pain at nighttime; already optimized on duloxetine, Lyrica  - 10-31: DC Elavil; did work well for pain, once causes of encephalopathy treated as below May retrial.  Reduce baclofen to 5 mg 3 times daily due to AKI.  -11/1-2 Reports continued back pain, overall under control with oxycodone  4. Mood/Behavior/Sleep: LCSW to follow for evaluation and support             --continue ambien prn.              -antipsychotic agents: N/A  - 10-29: Developing some confusion overnight.  Schedule Ambien as he is using it nightly, 5 mg.  Add as needed trazodone 50 mg nightly.  - 10-30: No reports of confusion overnight.  Dors is sleeping well.  - 10-31: Grossly encephalopathic today, workup reveals leukocytosis of 15, AKI 1.69, and  positive UA.  Delirium precautions, treatment as below.   -11/2 DC ambien per patient request, bad dreams reported  5. Neuropsych/cognition: This patient may be intermittently  capable of making decisions on his own behalf. 6. Skin/Wound Care: Routine pressure relief measures.   10/27 dc staples today  7. Fluids/Electrolytes/Nutrition: Monitor I/O.   8.  Hyperglycemia due to steroids for lung cancer w/brain mets: XRT started today and at end of day as able to help with tolerance of therapy             --on  Decadron 4 mg TID and fasting BS 120-150 range.  --Will check A1c in am--5.7, prediabetic - 11/2 fair control overall, continue to monitor Recent Labs    05/22/24 1723 05/23/24 1139  GLUCAP 116* 150*      9. COPD: On Singulair with albuterol prn.  10. HTN: Monitor BP TID---on avapro and cardura.   - Monitor with therapies today  - 10-25: Increase Cardura for urinary retention to 3 mg nightly  - 10-26: Blood pressures trending low; reduce Avapro 300 to 150 mg daily.  May need to reduce Cardura again if BP does not tolerate.  10/27 hold Avapro--BP improved 10-28--monitor on current regimen  10-31: Hypotension likely secondary to SIRS, give 1 L IV fluid bolus today, may need more fluids through the weekend.  Hold Cardura until BP improves.  -11/2 well controlled, continue current    05/25/2024  4:43 AM 05/24/2024    8:01 PM 05/24/2024    3:48 PM  Vitals with BMI  Systolic 131 120 889  Diastolic 88 75 67  Pulse 73 89 90    11. CAD: On Avapro and statin. Monitor for symptoms with increase in activity.              --continue to hold spironolactone and torsemide.    - weights stable Filed Weights   05/15/24 1727 05/16/24 1544 05/17/24 0504  Weight: 91.1 kg 91.1 kg 91 kg    12.  Hyponatremia: Na 132 @ admission-->131.  --Recheck BMET in am--stable 10/27 stable at 132 10-31: 131 today, likely secondary to AKI as below.  11-1 stable 134  13. Acute on chronic renal failure:  Encourage fluid intake.  BUN/SCr 27/1.32-->61/1.38 -- Monitor potassium levels as baseline 4.9-5.0-->continue ARB for now.  - Recheck CMET in am--improving BUN 37, creatinine 1.14, potassium 4.5 -10/27 BUN/Cr back up to 62/1.6 today, k+ 5.1 - hold avapro for now, push fluids and recheck in AM 10-28: BUN down to 47, creatinine normalized 1.09.  Continue to encourage p.o. fluids. 10-31: Recurrent AKI today, creatinine 1.69.  NA 131, potassium 5.2.  Give 1 L IV fluids, reduce baclofen as above, repeat BMP tonight, depending on results may give additional 1 L.  Repeat BMP in AM. 11/1 BUN and Cr improved to 52/1.16  14.  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  - Baclofen 10 mg 3 times daily  - Tizanidine 2 mg 3 times daily  - 10-25: Start dantrolene 25 mg daily, after 1 week test LFTs and increase to 3 times daily if tolerated.  Add clonazepam 0.5 mg 3 times daily as needed for myoclonus.  May need to add VPA if myoclonus worsens.  10-26/27: Spasms much better controlled, has not needed to use clonazepam yet.  Trend.  10-28: Start low-dose Keppra 500 mg twice daily for upper extremity myoclonus--notable improvement 10/29!  10/31: More myoclonus noted today, complicated by AKI and likely UTI as below.  Treat underlying causes, if continues to be persistent would recommend EEG over the weekend.  -11/2 Not having any today, continue to monitor. If it reoccurs pt advised to let nursing know, can consider EEG  15.  Neurogenic bladder/urinary retention.  Likely secondary to cauda equina involvement, flaccid bladder.  - Ongoing requirement of intermittent straight cathing; increase Cardura to 3 mg nightly  - 10-26: Added Urecholine 10 mg 3 times daily--if no response with up titration/intolerable side effects, would transition towards patient learning intermittent straight cathing   -10/27 cathed for 800cc this am.    -urecholine just added, consider increase in AM  to 25mg  if still retaining   -encouraged up to [facility] or toilet to empty  - 10-28: Increase frequency of cath to every 4 hours due to large volumes, increase Urecholine to 25 mg 3 times daily.  If no improvement with these measures, will need to drain patient on straight cathing versus place Foley based on upper extremity coordination and bladder volumes greater than 2.5 L/day.  - 10-29: Straight caths volumes better.  Continue Urecholine through today, renal ultrasound for structural cause assessment.  If abdominal cramping from Urecholine intolerable, will DC and discuss placement of Foley catheter  -10-30: Long discussion with patient, his wife regarding likely flaccid bladder secondary to cauda equina involvement.  Patient not agreeable to learning straight cathing at this time, wants Foley placed on discharge; will continue to  discuss this with them  10-31: UTI associated with urinary retention, DC bethanechol.  Reducing Cardura due to hypotension as above.  Called patient's wife to discuss Foley placement given IV fluids and already currently needing every 4 hour caths to avoid high volumes, went to voicemail, will need her consent before placement.  11/1 foley was started, continue Rocephin  11/2 Continue Rocephin, pansensitive ecoli  16.  Constipation/diarrhea.  - 10-29: Liquid bowel movements yesterday after sorbitol.  Move MiraLAX to as needed, reduce Senokot S to nightly.  Monitor today due to possibility of abdominal cramping with Urecholine.  - 10-30: Patient endorsing constipation, will increase Senokot S2 1 tab twice daily and MiraLAX daily  10-31: No bowel movement, will give sorbitol 1 back tonight.  11/2 Sorbitol 30ml ordered  17.  Leukocytosis, likely secondary to urinary tract infection  - Start ceftriaxone 2 g daily; follow cultures  - DC bethanechol as above  - Will discuss with wife Foley placement given already need for frequent caths, IV fluid as above  - Pending results of  checks x-ray given cough to look for alternate causes  -11/1 WBC back to 9.6  Recheck CBC   18. Aspiration pneumonia  -Continue rocephin, SLP  -Afebrile  LOS: 10 days A FACE TO FACE EVALUATION WAS PERFORMED  Murray Collier 05/25/2024, 1:09 PM

## 2024-05-26 ENCOUNTER — Ambulatory Visit: Admitting: Radiation Oncology

## 2024-05-26 ENCOUNTER — Ambulatory Visit: Attending: Radiation Oncology

## 2024-05-26 ENCOUNTER — Other Ambulatory Visit: Payer: Self-pay

## 2024-05-26 ENCOUNTER — Ambulatory Visit

## 2024-05-26 DIAGNOSIS — C3412 Malignant neoplasm of upper lobe, left bronchus or lung: Secondary | ICD-10-CM | POA: Insufficient documentation

## 2024-05-26 DIAGNOSIS — C7931 Secondary malignant neoplasm of brain: Secondary | ICD-10-CM | POA: Insufficient documentation

## 2024-05-26 DIAGNOSIS — Z510 Encounter for antineoplastic radiation therapy: Secondary | ICD-10-CM | POA: Insufficient documentation

## 2024-05-26 DIAGNOSIS — C7951 Secondary malignant neoplasm of bone: Secondary | ICD-10-CM | POA: Insufficient documentation

## 2024-05-26 DIAGNOSIS — Z87891 Personal history of nicotine dependence: Secondary | ICD-10-CM | POA: Insufficient documentation

## 2024-05-26 LAB — RAD ONC ARIA SESSION SUMMARY
Course Elapsed Days: 11
Plan Fractions Treated to Date: 3
Plan Fractions Treated to Date: 8
Plan Prescribed Dose Per Fraction: 3 Gy
Plan Prescribed Dose Per Fraction: 9 Gy
Plan Total Fractions Prescribed: 10
Plan Total Fractions Prescribed: 3
Plan Total Prescribed Dose: 27 Gy
Plan Total Prescribed Dose: 30 Gy
Reference Point Dosage Given to Date: 24 Gy
Reference Point Dosage Given to Date: 27 Gy
Reference Point Session Dosage Given: 3 Gy
Reference Point Session Dosage Given: 9 Gy
Session Number: 8

## 2024-05-26 LAB — CBC
HCT: 40.1 % (ref 39.0–52.0)
Hemoglobin: 13.2 g/dL (ref 13.0–17.0)
MCH: 32.8 pg (ref 26.0–34.0)
MCHC: 32.9 g/dL (ref 30.0–36.0)
MCV: 99.8 fL (ref 80.0–100.0)
Platelets: 84 K/uL — ABNORMAL LOW (ref 150–400)
RBC: 4.02 MIL/uL — ABNORMAL LOW (ref 4.22–5.81)
RDW: 12.9 % (ref 11.5–15.5)
WBC: 6.3 K/uL (ref 4.0–10.5)
nRBC: 0 % (ref 0.0–0.2)

## 2024-05-26 LAB — BASIC METABOLIC PANEL WITH GFR
Anion gap: 12 (ref 5–15)
BUN: 30 mg/dL — ABNORMAL HIGH (ref 8–23)
CO2: 19 mmol/L — ABNORMAL LOW (ref 22–32)
Calcium: 8.6 mg/dL — ABNORMAL LOW (ref 8.9–10.3)
Chloride: 104 mmol/L (ref 98–111)
Creatinine, Ser: 0.99 mg/dL (ref 0.61–1.24)
GFR, Estimated: >60 mL/min (ref >60)
Glucose, Bld: 127 mg/dL — ABNORMAL HIGH (ref 70–99)
Potassium: 5.1 mmol/L (ref 3.5–5.1)
Sodium: 135 mmol/L (ref 135–145)

## 2024-05-26 MED ORDER — POLYETHYLENE GLYCOL 3350 17 G PO PACK
17.0000 g | PACK | Freq: Two times a day (BID) | ORAL | Status: DC
  Administered 2024-05-27 – 2024-06-03 (×10): 17 g via ORAL
  Filled 2024-05-26 (×14): qty 1

## 2024-05-26 NOTE — Progress Notes (Signed)
 PROGRESS NOTE   Subjective/Complaints: No events overnight.   Labs stable; BUN continues to downtrend, creatinine improving, hyponatremia normalized.  Platelets slightly downtrending to 84 today..   Vital stable.    Complaining of 8 out of 9 back pain overnight-Roger Black states this is intermittent, he is worried about failure to progress in strength in lower extremities, failure to meet level where he feels wife can assist him safely at home.  ROS:  Neg for Chest pain or SOB Positive for cough,  urinary retention, and bilateral hip pain/spasms, bilateral foot sensory reduction   Objective:   No results found.   Recent Labs    05/25/24 1526 05/26/24 0458  WBC 6.2 6.3  HGB 12.7* 13.2  HCT 37.8* 40.1  PLT 93* 84*   Recent Labs    05/25/24 1526 05/26/24 0458  NA 130* 135  K 4.5 5.1  CL 101 104  CO2 20* 19*  GLUCOSE 158* 127*  BUN 33* 30*  CREATININE 0.96 0.99  CALCIUM 8.2* 8.6*    Intake/Output Summary (Last 24 hours) at 05/26/2024 0824 Last data filed at 05/26/2024 0440 Gross per 24 hour  Intake 240 ml  Output 2050 ml  Net -1810 ml        Physical Exam: Vital Signs Blood pressure 128/79, pulse 80, temperature 97.6 F (36.4 C), temperature source Oral, resp. rate 16, height 5' 8 (1.727 m), weight 91 kg, SpO2 95%.   Constitutional: No distress . Vital signs reviewed.  Laying in bed. HEENT: NCAT, EOMI, oral membranes moist. Neck: supple, no JVD. Cardiovascular: RRR without murmur. No JVD    Respiratory/Chest:  Clear to auscultation bilaterally.  No rales, rhonchi, or wheezing. GI/Abdomen: BS + hypoactive, non-tender, mildly-distended Ext: no clubbing, cyanosis, or edema Psych: Awake, alert, appropriate.  Skin: C/D/I. No apparent lesions. + Craniotomy-staples removed, well-approximated + PIV c/d/i  MSK:      No apparent deformity.        Neurologic exam:  Cognition: AAO to person, place, time +  decreased awareness  + tangential   Insight: Fair insight Sensation:  reduced in bilateral lower extremities CN: L eye difficulty converging, otherwise intact--no change Coordination:  Bilateral upper extremity loss of tone in hands with intention --not notable on today's exam Spasticity: MAS 0 in all extremities.          Strength: Unchanged                LUE: 5/5 SA, 5/5 EF, 5/5 EE, 5/5 WE, 5/5 FF, 5/5 FA                RUE:  5/5 SA, 5/5 EF, 5/5 EE, 5/5 WE, 5/5 FF, 5/5 FA                RLE: 3-/5 HF, 3/5 KE, 4-/5  DF, 4-/5                 LLE: 3/5 HF, 3/5 KE, 1/5  DF, 1/5     Assessment/Plan: 1. Functional deficits which require 3+ hours per day of interdisciplinary therapy in a comprehensive inpatient rehab setting. Physiatrist is providing close team supervision and 24 hour management of active  medical problems listed below. Physiatrist and rehab team continue to assess barriers to discharge/monitor Roger Black progress toward functional and medical goals  Care Tool:  Bathing    Body parts bathed by Roger Black: Right arm, Left arm, Abdomen, Chest, Front perineal area, Right upper leg, Left upper leg, Right lower leg, Left lower leg, Face   Body parts bathed by helper: Buttocks     Bathing assist Assist Level: Moderate Assistance - Roger Black 50 - 74%     Upper Body Dressing/Undressing Upper body dressing   What is the Roger Black wearing?: Pull over shirt    Upper body assist Assist Level: Contact Guard/Touching assist    Lower Body Dressing/Undressing Lower body dressing      What is the Roger Black wearing?: Underwear/pull up, Pants     Lower body assist Assist for lower body dressing: Moderate Assistance - Roger Black 50 - 74%     Toileting Toileting    Toileting assist Assist for toileting: Moderate Assistance - Roger Black 50 - 74%     Transfers Chair/bed transfer  Transfers assist     Chair/bed transfer assist level: Minimal Assistance - Roger Black > 75%      Locomotion Ambulation   Ambulation assist      Assist level: Moderate Assistance - Roger Black 50 - 74% Assistive device: No Device Max distance: 15'   Walk 10 feet activity   Assist     Assist level: Moderate Assistance - Roger Black - 50 - 74% Assistive device: No Device   Walk 50 feet activity   Assist Walk 50 feet with 2 turns activity did not occur: Safety/medical concerns         Walk 150 feet activity   Assist Walk 150 feet activity did not occur: Safety/medical concerns         Walk 10 feet on uneven surface  activity   Assist Walk 10 feet on uneven surfaces activity did not occur: Safety/medical concerns         Wheelchair     Assist Is the Roger Black using a wheelchair?: Yes Type of Wheelchair: Manual    Wheelchair assist level: Dependent - Roger Black 0% Max wheelchair distance: 150'    Wheelchair 50 feet with 2 turns activity    Assist        Assist Level: Dependent - Roger Black 0%   Wheelchair 150 feet activity     Assist      Assist Level: Dependent - Roger Black 0%   Blood pressure 128/79, pulse 80, temperature 97.6 F (36.4 C), temperature source Oral, resp. rate 16, height 5' 8 (1.727 m), weight 91 kg, SpO2 95%.  Medical Problem List and Plan: 1. 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 Daughter will be moving in with them. Myoclonus in [place] remains challenging; Min A ADLs overall min-mod for transfers. Amb 15 ft with RW limited by pain. Mild-moderate cognitive deficits variable depending on XRT.   - 10-31: More confused today, hypotensive, leukocytosis of 15 concerning for infection.  Most likely cause  is urinary given retention, but will also check chest x-ray  given cough.  IV fluids for AKI.  Notable that Roger Black started radiation therapy for his brain on 10-29  -Confusion improving  -11/2 PRAFO for left foot   Teams in a.m.  2.  Antithrombotics: -DVT/anticoagulation:  Pharmaceutical: Lovenox             -antiplatelet therapy: N/A  3. Pain Management/spasms: percocet prn.   - 10-24: Extreme difficulty tolerating therapies due to back and lower hip pain and spasms with mobility.  baclofen to 10 mg 3 times daily, add tizanidine 2 mg 3 times daily, schedule OxyContin 10 mg every 12 hours, continue as needed Percocet 5 mg every 4 hours.  - 10-25: Increased OxyContin to 15 mg twice daily, increase baclofen to 15 mg 3 times daily; immediately notified next therapy session the Roger Black was getting lethargic with these changes, so reverted to prior  - 10-26: Roger Black pain much improved on current regimen.  Unfortunately, appears to be somewhat sedated and euphoric on OxyContin 10 mg twice daily.  Not using as needed oxycodone, will instead schedule 5 mg 2 times daily before therapies in addition to as needed and DC long-acting.   - 10/27: perhaps a little more pain today but cognitively seemed more appropriate compared to what's recorded- no changes to regimen  10-28: Pain under better control, continues with spasms but seems to be doing better overall.  10-30: Add Elavil 25 mg nightly for peripheral nerve pain at nighttime; already optimized on duloxetine, Lyrica  - 10-31: DC Elavil; did work well for pain, once causes of encephalopathy treated as below May retrial.  Reduce baclofen to 5 mg 3 times daily due to AKI.  -11/1-2 Reports continued back pain, overall under control with oxycodone--tolerating current regimen, continue  4. Mood/Behavior/Sleep: LCSW to follow for evaluation and support             --continue ambien prn.              -antipsychotic agents: N/A  - 10-29: Developing some  confusion overnight.  Schedule Ambien as he is using it nightly, 5 mg.  Add as needed trazodone 50 mg nightly.  - 10-30: No reports of confusion overnight.  Dors is sleeping well.  - 10-31: Grossly encephalopathic today, workup reveals leukocytosis of 15, AKI 1.69, and positive UA.  Delirium precautions, treatment as below.   -11/2 DC ambien per Roger Black request, bad dreams reported  -May retrial Elavil if cognition remained stable through 11-4  5. Neuropsych/cognition: This Roger Black may be intermittently  capable of making decisions on his own behalf. 6. Skin/Wound Care: Routine pressure relief measures.   10/27 dc staples today  7. Fluids/Electrolytes/Nutrition: Monitor I/O.   8.  Hyperglycemia due to steroids for lung cancer w/brain mets: XRT started today and at end of day as able to help with tolerance of therapy             --on  Decadron 4 mg TID and fasting BS 120-150 range.  --Will check A1c in am--5.7, prediabetic  fair control overall, continue to monitor Recent Labs    05/23/24 1139  GLUCAP 150*      9. COPD: On Singulair with albuterol prn.  10. HTN: Monitor BP TID---on avapro and cardura.   - Monitor with therapies today  - 10-25: Increase Cardura for urinary retention to 3 mg nightly  - 10-26: Blood pressures trending low; reduce Avapro 300 to 150 mg daily.  May need to reduce Cardura again if BP does not tolerate.  10/27 hold Avapro--BP improved 10-28--monitor on current regimen  10-31: Hypotension likely secondary to SIRS, give 1 L IV fluid bolus today, may need more fluids through the weekend.  Hold Cardura until BP improves.  - well controlled, continue current    05/26/2024    4:42 AM 05/25/2024    7:57 PM 05/25/2024    1:18 PM  Vitals with BMI  Systolic 128 114 888  Diastolic 79 74 67  Pulse 80 92 94    11. CAD: On Avapro and statin. Monitor for symptoms with increase in activity.              --continue to hold spironolactone and torsemide.    - weights  stable Filed Weights   05/15/24 1727 05/16/24 1544 05/17/24 0504  Weight: 91.1 kg 91.1 kg 91 kg    12.  Hyponatremia: Na 132 @ admission-->131.  --Recheck BMET in am--stable 10/27 stable at 132 10-31: 131 today, likely secondary to AKI as below.  11-1 stable 134  13. Acute on chronic renal failure: Encourage fluid intake.  BUN/SCr 27/1.32-->61/1.38 -- Monitor potassium levels as baseline 4.9-5.0-->continue ARB for now.  - Recheck CMET in am--improving BUN 37, creatinine 1.14, potassium 4.5 -10/27 BUN/Cr back up to 62/1.6 today, k+ 5.1 - hold avapro for now, push fluids and recheck in AM 10-28: BUN down to 47, creatinine normalized 1.09.  Continue to encourage p.o. fluids. 10-31: Recurrent AKI today, creatinine 1.69.  NA 131, potassium 5.2.  Give 1 L IV fluids, reduce baclofen as above, repeat BMP tonight, depending on results may give additional 1 L.  Repeat BMP in AM. 11/1 BUN and Cr improved to 52/1.16--> back to baseline, creatinine 0.9  14.  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  - Baclofen 10 mg 3 times daily  - Tizanidine 2 mg 3 times daily  - 10-25: Start dantrolene 25 mg daily, after 1 week test LFTs and increase to 3 times daily if tolerated.  Add clonazepam 0.5 mg 3 times daily as needed for myoclonus.  May need to add VPA if myoclonus worsens.  10-26/27: Spasms much better controlled, has not needed to use clonazepam yet.  Trend.  10-28: Start low-dose Keppra 500 mg twice daily for upper extremity myoclonus--notable improvement 10/29!  10/31: More myoclonus noted today, complicated by AKI and likely UTI as below.  Treat underlying causes, if continues to be persistent would recommend EEG over the weekend.  -11/2 Not having any today, continue to monitor. If it reoccurs pt advised to let nursing know, can consider EEG   - 11-3: Roger Black reports this is worse in the evenings, ongoing.  Not appreciated on exam. will get EEG prior to Keppra  taper.  15.  Neurogenic bladder/urinary retention.  Likely secondary to cauda equina involvement, flaccid bladder.  - Ongoing requirement of intermittent straight cathing; increase Cardura to 3 mg nightly  - 10-26: Added Urecholine 10 mg 3 times daily--if no response with up titration/intolerable side effects, would transition towards Roger Black learning intermittent straight cathing   -10/27 cathed for 800cc this am.    -urecholine just added, consider increase in AM to 25mg  if still retaining   -encouraged up to [facility] or toilet to empty  - 10-28: Increase frequency of cath to every 4 hours due to large volumes, increase Urecholine to 25 mg 3 times daily.  If no improvement with these measures, will need to drain Roger Black on straight cathing versus place Foley based on upper  extremity coordination and bladder volumes greater than 2.5 L/day.  - 10-29: Straight caths volumes better.  Continue Urecholine through today, renal ultrasound for structural cause assessment.  If abdominal cramping from Urecholine intolerable, will DC and discuss placement of Foley catheter  -10-30: Long discussion with Roger Black, his wife regarding likely flaccid bladder secondary to cauda equina involvement.  Roger Black not agreeable to learning straight cathing at this time, wants Foley placed on discharge; will continue to discuss this with them  10-31: UTI associated with urinary retention, DC bethanechol.  Reducing Cardura due to hypotension as above.  Called Roger Black's wife to discuss Foley placement given IV fluids and already currently needing every 4 hour caths to avoid high volumes, went to voicemail, will need her consent before placement.  11/1 foley was started, continue Rocephin  11/2 Continue Rocephin, pansensitive ecoli--will reattempt DC Foley 1 week after placement if still at rehab.  Will need urology consult as outpatient  16.  Constipation/diarrhea.  - 10-29: Liquid bowel movements yesterday after sorbitol.  Move  MiraLAX to as needed, reduce Senokot S to nightly.  Monitor today due to possibility of abdominal cramping with Urecholine.  - 10-30: Roger Black endorsing constipation, will increase Senokot S2 1 tab twice daily and MiraLAX daily  10-31: No bowel movement, will give sorbitol 1 back tonight.  11/2 Sorbitol 30ml ordered--not given  Last bowel movement 10-31; increase MiraLAX to twice daily  17.  Leukocytosis, likely secondary to urinary tract infection  - Start ceftriaxone 2 g daily; follow cultures  - DC bethanechol as above  - Will discuss with wife Foley placement given already need for frequent caths, IV fluid as above  - Pending results of checks x-ray given cough to look for alternate causes  -11/1 WBC back to 9.6  11-3: WBC normalized.  18. Aspiration pneumonia  -Continue rocephin, SLP  -Afebrile  LOS: 11 days A FACE TO FACE EVALUATION WAS PERFORMED  Roger Black Likes 05/26/2024, 8:24 AM

## 2024-05-26 NOTE — Plan of Care (Signed)
  Problem: RH Swallowing Goal: LTG Patient will consume least restrictive diet using compensatory strategies with assistance (SLP) Description: LTG:  Patient will consume least restrictive diet using compensatory strategies with assistance (SLP) Flowsheets (Taken 05/26/2024 1226) LTG: Pt Patient will consume least restrictive diet using compensatory strategies with assistance of (SLP): Supervision

## 2024-05-26 NOTE — Evaluation (Signed)
 Speech Language Pathology Bedside Swallow Evaluation + Daily Session Note  Patient Details  Name: Roger Black MRN: [id number] Date of Birth: Apr 13, 1956  SLP Diagnosis: Cognitive Impairments;Dysphagia  Rehab Potential: Good ELOS: 10-14 days    Today's Date: 05/26/2024 SLP Individual Time: 1100-1200 SLP Individual Time Calculation (min): 60 min   Hospital Problem: Principal Problem:   Metastatic cancer to brain [facility])  Past Medical History:  Past Medical History:  Diagnosis Date   Actinic keratosis    Aortic atherosclerosis    Arthritis    Asthma    Cholelithiasis    Chronic kidney disease, stage 3a (HCC)    Chronic systolic CHF (congestive heart failure) (HCC)    COPD (chronic obstructive pulmonary disease) (HCC)    Coronary artery disease    Emphysema lung (HCC)    HTN (hypertension)    Hyperlipidemia    Nephrolithiasis    Personal history of tobacco use, presenting hazards to health 09/21/2015   Sleep apnea    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   right lateral hand, treating with 5FU/calcipotriene   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   vertex scalp, treating with 5FU/calcipotriene   Past Surgical History:  Past Surgical History:  Procedure Laterality Date   APPLICATION OF CRANIAL NAVIGATION Right 05/08/2024   Procedure: COMPUTER-ASSISTED NAVIGATION, FOR CRANIAL PROCEDURE;  Surgeon: [name] Dino HERO, MD;  Location: [facility] OR;  Service: Neurosurgery;  Laterality: Right;   COLON SURGERY     COLONOSCOPY WITH PROPOFOL  N/A 10/20/2020   Procedure: COLONOSCOPY WITH PROPOFOL ;  Surgeon: Toledo, Ladell POUR, MD;  Location: [facility] ENDOSCOPY;  Service: Gastroenterology;  Laterality: N/A;   CRANIOTOMY Right 05/08/2024   Procedure: CRANIOTOMY TUMOR EXCISION;  Surgeon: [name] Dino HERO, MD;  Location: [facility] OR;  Service: Neurosurgery;  Laterality: Right;    Assessment / Plan / Recommendation Clinical Impression HPI:  Pt is a 68 y/o male with PMH of  asthma, athritis, aortic atherosclerosis, COPD, CHF, CKD IIIa, CAD, HTN, OSA, squamous cell carcinoma in situ, and recent lung cancer (stage 1) s/p radiation in 12/24 who was admitted to [facility] on 10/6 at the request of his physician following MRI whole spine for progressive intractable back pain.  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  Pt had LP which was ultimately inconclusive.  MRI brain w and w/o showed circumscribed enhancing lesions in the right frontal lobe and left cerebellar hemisphere with associate vasogenic edema and no MLS or mass effect.  CT chest/abd/pelvis and bone scan without evidence of primary malignancy.  He was transferred to [facility] on 10/15 for further neurosurgical consultation with Dr. [name] who recommended R sided crani for biopsy/subtotal resection as lesion was attached to a vessel and not amenable to total resection.  This was completed on 10/16.  Post op course pain management and dex taper.  Final pathology showed metastatic carcinoma from the tumor biopsies.  Nonspecific staining noted but overall it was felt to be metastatic carcinoma of lung origin.  Radiation oncology consulted and recommended 2 weeks of palliative radiotherapy to cauda equina for 10 fractions and 3 fractions of stereotactic radiosurgery to the brain.  He had a simulation for his spinal radiation on 10/22 with tx to start on 10/23 for 10 sessions, and will have simulation for his brain radiation on 10/27 with tx to start on 10/28 for 3 sessions.   Bedside Swallow Evaluation + Daily Session  Note Skilled therapy session focused on cognitive goals and completion of bedside swallow evaluation. Upon entrance, patient requested assistance in retrieving phone number of bank. Patient independently utilized wallet to locate name of bank from card and requested SLP assistance in web search. Patient  independently called bank and discussed needing a new debit card. SLP then completed bedside swallow evaluation due to possibly aspiration PNA noted on CXR 10/31. Patient with functional oral mechanism exam. Patient consumed thin liquids via cup/straw, puree and solids. Complete oral clearance and timely mastication. Patient with residual of polygrip on palatal surface and SLP educatied patient on importance of atleast BID, thorough oral care. Patient with x1 delayed throat clear after all PO was d/c. This may be baseline as patient with a hx of smoking and congested cough. Recommend continuation of current diet. SLP then targeted cognitive goals through problem solving task. Patient was prompted to utilize written and verbalized instructions to complete TID pill box. Patient verbose and tangential, requiring minA to complete. Patient with x1 mistake, requiring minA to correct. Patient left in chair with alarm set and call bell in reach. Continue POC.     Skilled Therapeutic Interventions          Patient evaluated using a bedside swallow evaluation to assess current swallowing function. See above for details.    SLP Assessment  Patient will need skilled Speech Lanaguage Pathology Services during CIR admission    Recommendations  SLP Diet Recommendations: Age appropriate regular solids;Thin Liquid Administration via: Straw Medication Administration: Whole meds with liquid Supervision: Patient able to self feed Compensations: Slow rate;Small sips/bites Postural Changes and/or Swallow Maneuvers: Out of bed for meals;Seated upright 90 degrees Oral Care Recommendations: Oral care BID Patient destination: Home Follow up Recommendations: Home Health SLP Equipment Recommended: None recommended by SLP    SLP Frequency 1 to 3 out of 7 days   SLP Duration  SLP Intensity  SLP Treatment/Interventions 10-14 days  Minumum of 1-2 x/day, 30 to 90 minutes  Cognitive remediation/compensation;Cueing  hierarchy;Functional tasks;Therapeutic Activities;Therapeutic Exercise;Patient/family education;Internal/external aids    Pain None reported    SLP Evaluation Oral Motor Oral Motor/Sensory Function Overall Oral Motor/Sensory Function: Within functional limits Motor Speech Overall Motor Speech: Appears within functional limits for tasks assessed  Care Tool Care Tool Cognition Ability to hear (with hearing aid or hearing appliances if normally used Ability to hear (with hearing aid or hearing appliances if normally used): 0. Adequate - no difficulty in normal conservation, social interaction, listening to TV   Expression of Ideas and Wants Expression of Ideas and Wants: 4. Without difficulty (complex and basic) - expresses complex messages without difficulty and with speech that is clear and easy to understand   Understanding Verbal and Non-Verbal Content Understanding Verbal and Non-Verbal Content: 4. Understands (complex and basic) - clear comprehension without cues or repetitions  Memory/Recall Ability Memory/Recall Ability : Current season;Location of own room;Staff names and faces;That he or she is in a hospital/hospital unit   Bedside Swallowing Assessment General Previous Swallow Assessment: none Diet Prior to this Study: Regular;Thin liquids (Level 0) Respiratory Status: Room air Behavior/Cognition: Alert;Cooperative;Pleasant mood Oral Cavity - Dentition: Dentures, top;Missing dentition Self-Feeding Abilities: Able to feed self Patient Positioning: Upright in chair/Tumbleform Baseline Vocal Quality: Hoarse Volitional Cough: Strong;Congested Volitional Swallow: Able to elicit  Ice Chips Ice chips: Not tested Thin Liquid Thin Liquid: Within functional limits Presentation: Cup;Self Fed;Straw Nectar Thick Nectar Thick Liquid: Not tested Honey Thick Honey Thick Liquid: Not tested Puree Puree: Within  functional limits Presentation: Self Fed;Spoon Solid Solid:  Impaired Pharyngeal Phase Impairments: Throat Clearing - Delayed BSE Assessment Risk for Aspiration Impact on safety and function: Mild aspiration risk  Short Term Goals: Week 1: SLP Short Term Goal 1 (Week 1): Pt will recall recent/relevant information w/ minA SLP Short Term Goal 2 (Week 1): Pt will solve mildly complex problems w/ minA  Refer to Care Plan for Long Term Goals  Recommendations for other services: None   Discharge Criteria: Patient will be discharged from SLP if patient refuses treatment 3 consecutive times without medical reason, if treatment goals not met, if there is a change in medical status, if patient makes no progress towards goals or if patient is discharged from hospital.  The above assessment, treatment plan, treatment alternatives and goals were discussed and mutually agreed upon: by patient  Jamiah Homeyer M.A., CCC-SLP 05/26/2024, 12:20 PM

## 2024-05-26 NOTE — Progress Notes (Signed)
 Patient ID: Roger Black, male   DOB: [birth year], 68 y.o.   MRN: [id number]  SW met with pt at his request. Discussed family edu. SW explained that multiple calls were made to his wife and left voicemail and she had not called back so this was not put on the schedule for today. He reports he will speak with his wife about coming in on Tuesday evening to spend the night, and stay for fam edu on wednesday morning 9am-71m. Will discuss with is daughter inlaw. He also reports that he is concerned about discharge and does not feel he is ready to leave this week, but more next week. SW shared concerns will be relayed to medical team.   Graeme Jude, MSW, LCSW Office: [phone] Cell: [phone] Fax: [phone]

## 2024-05-26 NOTE — Progress Notes (Signed)
 Occupational Therapy Weekly Progress Note  Patient Details  Name: Roger Black MRN: [id number] Date of Birth: Aug 21, 1955  Beginning of progress report period: 05/16/2024 End of progress report period: 05/26/2024  Today's Date: 05/26/2024 OT Individual Time: 9062-8950 OT Individual Time Calculation (min): 72 min    Patient has met 3 of 4 short term goals. Roger Black has had a mixed week in CIR with several medical issues limiting his full participation and progress, however he has still made improvements in his ADLs and transfers. He is currently at a min A- CGA level overall. OT still has concerns re his d/c home and family education is scheduled for Wednesday with his wife and daughter. The myoclonic jerking in his hands has improved as of today and he has had no buckling of the LE either.   Patient continues to demonstrate the following deficits: muscle weakness, decreased cardiorespiratoy endurance, decreased safety awareness and delayed processing, and decreased standing balance, decreased postural control, and decreased balance strategies and therefore will continue to benefit from skilled OT intervention to enhance overall performance with BADL and iADL.  Patient progressing toward long term goals..  Continue plan of care.  OT Short Term Goals Week 1:  OT Short Term Goal 1 (Week 1): Pt will use LRAD and don pants with min A OT Short Term Goal 1 - Progress (Week 1): Met OT Short Term Goal 2 (Week 1): Pt will complete stand pivot to the [facility] with min A OT Short Term Goal 2 - Progress (Week 1): Met OT Short Term Goal 3 (Week 1): Pt will complete toileting tasks with min A OT Short Term Goal 3 - Progress (Week 1): Met OT Short Term Goal 4 (Week 1): Pt will tolerate sitting up in the w/c for 2 hours to demo improved activity tolerance OT Short Term Goal 4 - Progress (Week 1): Progressing toward goal Week 2:  OT Short Term Goal 1 (Week 2): STG= LTG d/t ELOS  Skilled Therapeutic  Interventions/Progress Updates:    Pt received supine with 8/10 pain in his back, agreeable to session with repositioning used. He was verbose throughout session, requiring frequent redirection to task. Patient required increased time for initiation, cuing, rest breaks, and for completion of tasks throughout session. Utilized therapeutic use of self throughout to promote efficiency. He was overall much improved compared to the last few sessions in confusion and physical ability. He came to EOB with (S) using bed features. He stood from EOB with min A, bed slightly elevated. He completed a stand pivot transfer to the [facility] with min A using the RW. He was able to remove one UE from the RW at a time to pull down brief before sitting. No void despite extra time sitting- smear when cleaning. He stood to complete peri hygiene and was able to maintain static standing balance with CGA for 1 minute before abruptly sitting. Provided recommendation that pt complete hygiene seated at this time to reduce fall risk. Provided cueing for body positioning to improve accessibility during hygiene. He finished wiping seated, with CGA. He transferred to the w/c following. He completed grooming tasks seated at the sink with set up assist- good improvement in myoclonic jerking in B hands! He was provided a reacher and then with min cueing was able to thread pants over BLE and pull up in standing with min A. Min A to don shirt d/t time constraints. Pt was left sitting up in the wheelchair with all needs met, chair alarm set,  and call bell within reach.    Therapy Documentation Precautions:  Precautions Precautions: Fall Precaution/Restrictions Comments: BP <150 Restrictions Weight Bearing Restrictions Per Provider Order: No  Therapy/Group: Individual Therapy  [name] 05/26/2024, 10:09 AM

## 2024-05-26 NOTE — Progress Notes (Signed)
 Physical Therapy Session Note  Patient Details  Name: Roger Black MRN: [id number] Date of Birth: 1955-10-17  Today's Date: 05/26/2024 PT Individual Time: 1304-1400 PT Individual Time Calculation (min): 56 min   Short Term Goals: Week 2:  PT Short Term Goal 1 (Week 2): STGs = LTGs  Skilled Therapeutic Interventions/Progress Updates:     Pt received seated in WC and agrees to therapy. Appears much more alert, oriented, and energetic relative to previous session with this therapist (several days prior). WC transport to gym. Pt performs LAQs alternating x20 to warm up extensor muscles prior to mobility. Pt performs sit to stand with cues for hand placement and sequencing, with CGA/minA and facilitation of anterior weight shifting. Pt ambulates x80' with RW and CGA, with improved NM control and and stability. PT provides cues for upright gaze to improve posture and balance, and decreasing WB through RW for energy conservation and body mechanics.   Following rest break, pt ambulates additional x60' with similar cues and assistance. Pt provided with increased cueing for increased eccentric control of stand to sit for safety and strengthening.  Pt completes Nustep for endurance and increasing ROM in hips and knees. Pt completes x15:00 at workload of 5 with average steps per minute ~30. PT provides cues for hand and foot placement and competing full available ROM.   Pt performs stand step transfer back to Nustep with CGA and cues for sequencing and eccentric control. Left seated with all needs within reach.   Therapy Documentation Precautions:  Precautions Precautions: Fall Precaution/Restrictions Comments: BP <150 Restrictions Weight Bearing Restrictions Per Provider Order: No   Therapy/Group: Individual Therapy  Elsie JAYSON Dawn, PT, DPT 05/26/2024, 4:02 PM

## 2024-05-26 NOTE — Progress Notes (Signed)
 Speech Language Pathology Weekly Progress  Patient Details  Name: Roger Black MRN: [id number] Date of Birth: 15-Oct-1955  Beginning of progress report period: May 16, 2024 End of progress report period: May 26, 2024  Short Term Goals: Week 1: SLP Short Term Goal 1 (Week 1): Pt will recall recent/relevant information w/ minA SLP Short Term Goal 1 - Progress (Week 1): Met SLP Short Term Goal 2 (Week 1): Pt will solve mildly complex problems w/ minA SLP Short Term Goal 2 - Progress (Week 1): Met    New Short Term Goals: Week 2: SLP Short Term Goal 1 (Week 2): STG = LTG due to ELOS  Weekly Progress Updates: Pt has made good gains and has met 2 of 2 STGs this reporting period due to improved cognition. Currently, patient continues to require min A for memory and problem solving skils.  Pt/family eduction ongoing. Pt would benefit from continued ST intervention to maximize cognition and tolerance of current diet in order to maximize functional independence at d/c.    Intensity: Minumum of 1-2 x/day, 30 to 90 minutes Frequency: 1 to 3 out of 7 days Duration/Length of Stay: 10-14 days Treatment/Interventions: Cognitive remediation/compensation;Cueing hierarchy;Functional tasks;Therapeutic Activities;Therapeutic Exercise;Patient/family education;Internal/external aids  Pilar Corrales M.A., CCC-SLP 05/26/2024, 12:24 PM

## 2024-05-27 ENCOUNTER — Other Ambulatory Visit: Payer: Self-pay

## 2024-05-27 ENCOUNTER — Ambulatory Visit

## 2024-05-27 DIAGNOSIS — Z510 Encounter for antineoplastic radiation therapy: Secondary | ICD-10-CM | POA: Diagnosis not present

## 2024-05-27 LAB — RAD ONC ARIA SESSION SUMMARY
Course Elapsed Days: 12
Plan Fractions Treated to Date: 9
Plan Prescribed Dose Per Fraction: 3 Gy
Plan Total Fractions Prescribed: 10
Plan Total Prescribed Dose: 30 Gy
Reference Point Dosage Given to Date: 27 Gy
Reference Point Session Dosage Given: 3 Gy
Session Number: 9

## 2024-05-27 MED ORDER — CEPHALEXIN 250 MG PO CAPS
500.0000 mg | ORAL_CAPSULE | Freq: Two times a day (BID) | ORAL | Status: AC
  Administered 2024-05-27 – 2024-05-29 (×6): 500 mg via ORAL
  Filled 2024-05-27 (×6): qty 2

## 2024-05-27 NOTE — Progress Notes (Signed)
 PROGRESS NOTE   Subjective/Complaints: No events overnight.  Completed 3-3 sessions for CRT for brain yesterday.  Patient again asking lots of questions regarding progress of his radiation therapy, why he is not getting more radiation treatments to his brain, if treatments have been affected.  Advised that posttreatment imaging has not been done to evaluate effectiveness yet, but questions regarding dose/frequency for CRT should be directed to radiation oncology.  Complaining of 8-9 out of 10 leg pain overnight.  None currently on exam, states he is well-controlled on current medications. Eating well.  Foley in place, urinary output appropriate.  Large bowel movement, continent this a.m.  Endorses no further shaking in his hands for approximately 2 days, shaving with the assistance of OT this morning.  ROS:  Neg for Chest pain or SOB Positive for urinary retention, and bilateral hip pain/spasms, bilateral foot sensory reduction   Objective:   No results found.   Recent Labs    05/25/24 1526 05/26/24 0458  WBC 6.2 6.3  HGB 12.7* 13.2  HCT 37.8* 40.1  PLT 93* 84*   Recent Labs    05/25/24 1526 05/26/24 0458  NA 130* 135  K 4.5 5.1  CL 101 104  CO2 20* 19*  GLUCOSE 158* 127*  BUN 33* 30*  CREATININE 0.96 0.99  CALCIUM 8.2* 8.6*    Intake/Output Summary (Last 24 hours) at 05/27/2024 0948 Last data filed at 05/27/2024 0544 Gross per 24 hour  Intake 240 ml  Output 1750 ml  Net -1510 ml        Physical Exam: Vital Signs Blood pressure 126/77, pulse 93, temperature (!) 97.5 F (36.4 C), resp. rate 19, height 5' 8 (1.727 m), weight 91 kg, SpO2 97%.   Constitutional: No distress . Vital signs reviewed.  Sitting up in bedside wheelchair. HEENT: NCAT, EOMI, oral membranes moist. Neck: supple, no JVD. Cardiovascular: RRR without murmur. No JVD    Respiratory/Chest:  Clear to auscultation bilaterally.  No  rales, rhonchi, or wheezing. GI/Abdomen: BS + hypoactive, non-tender, nondistended Ext: no clubbing, cyanosis, or edema Psych: Awake, alert, appropriate.  Jovial.  Skin: C/D/I. No apparent lesions. + Craniotomy-staples removed, well-approximated + PIV c/d/i  MSK:      No apparent deformity.        Neurologic exam:  Cognition: AAO to person, place, time + decreased concentration/awareness, appropriate with conversations but does not carryover well. + tangential   Insight: Fair insight Sensation:  reduced in bilateral lower extremities CN: L eye difficulty converging, otherwise intact--no change Coordination:  Bilateral upper extremity loss of tone in hands with intention --no longer present on exam Spasticity: MAS 0 in all extremities.          Strength: Unchanged                LUE: 5/5 SA, 5/5 EF, 5/5 EE, 5/5 WE, 5/5 FF, 5/5 FA                RUE:  5/5 SA, 5/5 EF, 5/5 EE, 5/5 WE, 5/5 FF, 5/5 FA                RLE: 3-/5 HF, 3/5 KE, 4-/5  DF, 4-/5 PF                LLE: 3/5 HF, 3/5 KE, 1/5  DF, 2/5 PF--unchanged    Assessment/Plan: 1. Functional deficits which require 3+ hours per day of interdisciplinary therapy in a comprehensive inpatient rehab setting. Physiatrist is providing close team supervision and 24 hour management of active medical problems listed below. Physiatrist and rehab team continue to assess barriers to discharge/monitor patient progress toward functional and medical goals  Care Tool:  Bathing    Body parts bathed by patient: Right arm, Left arm, Abdomen, Chest, Front perineal area, Right upper leg, Left upper leg, Right lower leg, Left lower leg, Face   Body parts bathed by helper: Buttocks     Bathing assist Assist Level: Moderate Assistance - Patient 50 - 74%     Upper Body Dressing/Undressing Upper body dressing   What is the patient wearing?: Pull over shirt    Upper body assist Assist Level: Contact Guard/Touching assist    Lower Body  Dressing/Undressing Lower body dressing      What is the patient wearing?: Underwear/pull up, Pants     Lower body assist Assist for lower body dressing: Moderate Assistance - Patient 50 - 74%     Toileting Toileting    Toileting assist Assist for toileting: Moderate Assistance - Patient 50 - 74%     Transfers Chair/bed transfer  Transfers assist     Chair/bed transfer assist level: Minimal Assistance - Patient > 75%     Locomotion Ambulation   Ambulation assist      Assist level: Contact Guard/Touching assist Assistive device: Walker-rolling Max distance: 80'   Walk 10 feet activity   Assist     Assist level: Contact Guard/Touching assist Assistive device: Walker-rolling   Walk 50 feet activity   Assist Walk 50 feet with 2 turns activity did not occur: Safety/medical concerns  Assist level: Contact Guard/Touching assist Assistive device: Walker-rolling    Walk 150 feet activity   Assist Walk 150 feet activity did not occur: Safety/medical concerns         Walk 10 feet on uneven surface  activity   Assist Walk 10 feet on uneven surfaces activity did not occur: Safety/medical concerns         Wheelchair     Assist Is the patient using a wheelchair?: Yes Type of Wheelchair: Manual    Wheelchair assist level: Dependent - Patient 0% Max wheelchair distance: 150'    Wheelchair 50 feet with 2 turns activity    Assist        Assist Level: Dependent - Patient 0%   Wheelchair 150 feet activity     Assist      Assist Level: Dependent - Patient 0%   Blood pressure 126/77, pulse 93, temperature (!) 97.5 F (36.4 C), resp. rate 19, height 5' 8 (1.727 m), weight 91 kg, SpO2 97%.  Medical Problem List and Plan: 1. 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 Daughter will be moving in with  them. Myoclonus in [place] remains challenging; Min A ADLs overall min-mod for transfers. Amb 15 ft with RW limited by pain. Mild-moderate cognitive deficits variable depending on XRT.   - 10-31: More confused today, hypotensive, leukocytosis of 15 concerning for infection.  Most likely cause is urinary given retention, but will also check chest x-ray given cough.  IV fluids for AKI.  Notable that patient started radiation therapy for his brain on 10-29  -11/2 [facility] for left foot  - 11/4: Fluctuating participation with myoclonus/jerking; improved this week. Goals are supervision for PT. UBD SPV, Min A with reacher LB. Jerking and cognition improving.    - Messaged Dr. Dewey with radiation oncology per patient request for many questions regarding locations of radiation therapy treatment, decision for how many treatments are required in which areas, and how to measure progress/response.  Also encouraged patient to ask these questions when he is at radiation oncology appointments, although he is more comfortable with physician reaching out to provider.  2.  Antithrombotics: -DVT/anticoagulation:  Pharmaceutical: Lovenox             -antiplatelet therapy: N/A  3. Pain Management/spasms: percocet prn.   - 10-24: Extreme difficulty tolerating therapies due to back and lower hip pain and spasms with mobility.  baclofen to 10 mg 3 times daily, add tizanidine 2 mg 3 times daily, schedule OxyContin 10 mg every 12 hours, continue as needed Percocet 5 mg every 4 hours.  - 10-25: Increased OxyContin to 15 mg twice daily, increase baclofen to 15 mg 3 times daily; immediately notified next therapy session the patient was getting lethargic with these changes, so reverted to prior  - 10-26: Patient pain  much improved on current regimen.  Unfortunately, appears to be somewhat sedated and euphoric on OxyContin 10 mg twice daily.  Not using as needed oxycodone, will instead schedule 5 mg 2 times daily before therapies in addition to as needed and DC long-acting.   - 10/27: perhaps a little more pain today but cognitively seemed more appropriate compared to what's recorded- no changes to regimen  10-28: Pain under better control, continues with spasms but seems to be doing better overall.  10-30: Add Elavil 25 mg nightly for peripheral nerve pain at nighttime; already optimized on duloxetine, Lyrica  - 10-31: DC Elavil; did work well for pain, once causes of encephalopathy treated as below May retrial.  Reduce baclofen to 5 mg 3 times daily due to AKI.  -11/1-2 Reports continued back pain, overall under control with oxycodone--tolerating current regimen, continue  4. Mood/Behavior/Sleep: LCSW to follow for evaluation and support             --continue ambien prn.              -antipsychotic agents: N/A  - 10-29: Developing some confusion overnight.  Schedule Ambien as he is using it nightly, 5 mg.  Add as needed trazodone 50 mg nightly.  - 10-30: No reports of confusion overnight.  Dors is sleeping well.  - 10-31: Grossly encephalopathic today, workup reveals leukocytosis of 15, AKI 1.69, and positive UA.  Delirium precautions, treatment as below.   -11/2 DC ambien per patient request, bad dreams reported  - 11-4: Sleeping well at night, no current changes warranted.  5. Neuropsych/cognition: This patient may be intermittently  capable of making decisions on his own behalf. 6. Skin/Wound Care: Routine pressure relief measures.   10/27 dc staples today  7. Fluids/Electrolytes/Nutrition: Monitor I/O.   8.  Hyperglycemia  due to steroids for lung cancer w/brain mets: XRT started today and at end of day as able to help with tolerance of therapy             --on  Decadron 4 mg TID and fasting BS 120-150  range.  --Will check A1c in am--5.7, prediabetic  fair control overall, continue to monitor  9. COPD: On Singulair with albuterol prn.  10. HTN: Monitor BP TID---on avapro and cardura.   - Monitor with therapies today  - 10-25: Increase Cardura for urinary retention to 3 mg nightly  - 10-26: Blood pressures trending low; reduce Avapro 300 to 150 mg daily.  May need to reduce Cardura again if BP does not tolerate.  10/27 hold Avapro--BP improved 10-28--monitor on current regimen  10-31: Hypotension likely secondary to SIRS, give 1 L IV fluid bolus today, may need more fluids through the weekend.  Hold Cardura until BP improves.  - well controlled, continue current    05/27/2024    5:10 AM 05/26/2024    7:32 PM 05/26/2024   12:45 PM  Vitals with BMI  Systolic 126 132 871  Diastolic 77 72 80  Pulse 93 85 93    11. CAD: On Avapro and statin. Monitor for symptoms with increase in activity.              --continue to hold spironolactone and torsemide.    - weights stable Filed Weights   05/15/24 1727 05/16/24 1544 05/17/24 0504  Weight: 91.1 kg 91.1 kg 91 kg    12.  Hyponatremia: Na 132 @ admission-->131.  --Recheck BMET in am--stable 10/27 stable at 132 10-31: 131 today, likely secondary to AKI as below.  11-1 stable 134  13. Acute on chronic renal failure: Encourage fluid intake.  BUN/SCr 27/1.32-->61/1.38 -- Monitor potassium levels as baseline 4.9-5.0-->continue ARB for now.  - Recheck CMET in am--improving BUN 37, creatinine 1.14, potassium 4.5 -10/27 BUN/Cr back up to 62/1.6 today, k+ 5.1 - hold avapro for now, push fluids and recheck in AM 10-28: BUN down to 47, creatinine normalized 1.09.  Continue to encourage p.o. fluids. 10-31: Recurrent AKI today, creatinine 1.69.  NA 131, potassium 5.2.  Give 1 L IV fluids, reduce baclofen as above, repeat BMP tonight, depending on results may give additional 1 L.  Repeat BMP in AM. 11/1 BUN and Cr improved to 52/1.16--> back to  baseline, creatinine 0.9  14.  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  - Baclofen 10 mg 3 times daily  - Tizanidine 2 mg 3 times daily  - 10-25: Start dantrolene 25 mg daily, after 1 week test LFTs and increase to 3 times daily if tolerated.  Add clonazepam 0.5 mg 3 times daily as needed for myoclonus.  May need to add VPA if myoclonus worsens.  10-26/27: Spasms much better controlled, has not needed to use clonazepam yet.  Trend.  10-28: Start low-dose Keppra 500 mg twice daily for upper extremity myoclonus--notable improvement 10/29!  10/31: More myoclonus noted today, complicated by AKI and likely UTI as below.  Treat underlying causes, if continues to be persistent would recommend EEG over the weekend.  -11/2 Not having any today, continue to monitor. If it reoccurs pt advised to let nursing know, can consider EEG   - 11-3/4: Reports no further myoclonus for the last 2 days.  Continue Keppra, if recurs will get EEG.  15.  Neurogenic bladder/urinary retention.  Likely secondary to cauda equina  involvement, flaccid bladder.  - Ongoing requirement of intermittent straight cathing; increase Cardura to 3 mg nightly  - 10-26: Added Urecholine 10 mg 3 times daily--if no response with up titration/intolerable side effects, would transition towards patient learning intermittent straight cathing   -10/27 cathed for 800cc this am.    -urecholine just added, consider increase in AM to 25mg  if still retaining   -encouraged up to [facility] or toilet to empty  - 10-28: Increase frequency of cath to every 4 hours due to large volumes, increase Urecholine to 25 mg 3 times daily.  If no improvement with these measures, will need to drain patient on straight cathing versus place Foley based on upper extremity coordination and bladder volumes greater than 2.5 L/day.  - 10-29: Straight caths volumes better.  Continue Urecholine through today, renal ultrasound for structural  cause assessment.  If abdominal cramping from Urecholine intolerable, will DC and discuss placement of Foley catheter  -10-30: Long discussion with patient, his wife regarding likely flaccid bladder secondary to cauda equina involvement.  Patient not agreeable to learning straight cathing at this time, wants Foley placed on discharge; will continue to discuss this with them  10-31: UTI associated with urinary retention, DC bethanechol.  Reducing Cardura due to hypotension as above.  Called patient's wife to discuss Foley placement given IV fluids and already currently needing every 4 hour caths to avoid high volumes, went to voicemail, will need her consent before placement.  11/1 foley was started, continue Rocephin  11/2 Continue Rocephin, pansensitive ecoli--will reattempt DC Foley 1 week after placement if still at rehab.  Will need urology consult as outpatient  11-4: Vital stable, switch Rocephin to p.o. Keflex for 3 days.  16.  Constipation/diarrhea.  - 10-29: Liquid bowel movements yesterday after sorbitol.  Move MiraLAX to as needed, reduce Senokot S to nightly.  Monitor today due to possibility of abdominal cramping with Urecholine.  - 10-30: Patient endorsing constipation, will increase Senokot S2 1 tab twice daily and MiraLAX daily  10-31: No bowel movement, will give sorbitol 1 back tonight.  11/2 Sorbitol 30ml ordered--not given   10-31; increase MiraLAX to twice daily  Last bowel movement 11-4, large.  Continent.  17.  Leukocytosis, likely secondary to urinary tract infection  - Start ceftriaxone 2 g daily; follow cultures  - DC bethanechol as above  - Will discuss with wife Foley placement given already need for frequent caths, IV fluid as above  - Pending results of checks x-ray given cough to look for alternate causes  -11/1 WBC back to 9.6  11-3: WBC normalized.  18. Aspiration pneumonia  -Continue rocephin, SLP  -Afebrile  - 11/3: No concern for bedsde swallow    LOS: 12 days A FACE TO FACE EVALUATION WAS PERFORMED  Joesph JAYSON Likes 05/27/2024, 9:48 AM

## 2024-05-27 NOTE — Progress Notes (Signed)
 Patient ID: Roger Black, male   DOB: 11-03-1955, 68 y.o.   MRN: [id number]  SW met with pt in room confirming family edu tomorrow morning. SW will update once more information form team.   Graeme Jude, MSW, LCSW Office: [phone] Cell: [phone] Fax: [phone]

## 2024-05-27 NOTE — Progress Notes (Signed)
 Physical Therapy Note  Patient Details  Name: Roger Black MRN: [id number] Date of Birth: [birth year] Today's Date: 05/27/2024    Physical Therapist participated in the interdisciplinary team conference, providing clinical information regarding the patient's current status, treatment goals, and weekly focus, including any barriers that need to be addressed. Please see the Inpatient Rehabilitation Team Conference and Plan of Care Update for further details.    [name] [name] [name] [name], PT, DPT, CBIS  05/27/2024, 10:31 AM

## 2024-05-27 NOTE — Patient Care Conference (Signed)
 Inpatient RehabilitationTeam Conference and Plan of Care Update Date: 05/27/2024   Time: 1026 am    Patient Name: Roger Black      Medical Record Number: [id number]  Date of Birth: June 15, 1956 Sex: Male         Room/Bed: 4W11C/4W11C-01 Payor Info: Payor: [employer] / Plan: [employer] PPO / Product Type: *No Product type* /    Admit Date/Time:  05/15/2024  5:07 PM  Primary Diagnosis:  Metastatic cancer to brain [facility])  Hospital Problems: Principal Problem:   Metastatic cancer to brain [facility])    Expected Discharge Date: Expected Discharge Date: 06/03/24  Team Members Present: Physician leading conference: Dr. Joesph Likes Social Worker Present: Graeme Jude, LCSW Nurse Present: Eulalio Falls, RN PT Present: Donald Pereyra, PT OT Present: Nereida Habermann, OT SLP Present: Recardo Mole, SLP Other (Discipline and Name): Odis Guppy PT PPS Coordinator present : Eleanor Colon, SLP     Current Status/Progress Goal Weekly Team Focus  Bowel/Bladder   foley in place; LBM: 11/4   gain regular bowel pattern   assist with toileting needs prn    Swallow/Nutrition/ Hydration   regular/thin   supervisionA  tolerance, need for MBS if s/sx of pharyngeal dysphagia arise    ADL's   (S) UB ADls, min A LB ADLs with reacher, min A toileting, myoclonic jerking and cognition much improved from last week. Change d/c to Tuesday the 11th d/t fall and inconsistent presentation   Supervision ADLs and transfers   ADL, transfers, d/c planning, cognitive retraining    Mobility   minA bed mobility, CGA/minA transfers, CGA gait x80' with RW (had fall on friday with total body buckling)   Supervision  family ed, transfer training, endurance, gait training, DC prep    Communication                Safety/Cognition/ Behavioral Observations  improved cognition this week, supervision - minA   supervision   education, memory strategies, functional problem solving  tasks    Pain   c/o pain to bil LE   pain level <3/10   assess pain QS and prn    Skin   crani incision OTA   maintain skin integrity  assess skin QS and prn      Discharge Planning:  Pt will d/c to home with his wife. Pt will have transportation through duration of radiation treatment while here; ends 11/5. Fam edu on Wedn 8am-11am with wife and DIL. SW will confirm there are no barriers to discharge.    Team Discussion: Patient admitted post R craniotomy secondary to brain metastasis. Patient with pain/spasm on lower  hips/legs / UTI: medication adjusted by MD. Patient progress limited by lower extremity myoclonic jerking, total body buckling. Cognition is much improved.  Patient on target to meet rehab goals: Currently patient needs minimal assistance with lower body care and supervision assistance with upper body care. Patient needs minimal assistance with transfers . Patient was able to ambulate up to  93' using a rolling walker. Patient with inconsistent presentation. Patient needs supervision- min assistance with cognition. Overall goals at discharge are set for supervision assistance.    *See Care Plan and progress notes for long and short-term goals.   Revisions to Treatment Plan:  Prafo left foot   Foley catheter with trial voilding  Revision of discharge date  Teaching Needs: Safety, medications, transfers, toileting , incision care, etc   Current Barriers to Discharge: Decreased caregiver support, Home enviroment access/layout, and Neurogenic  bowel and bladder  Possible Resolutions to Barriers: Family Education     Medical Summary Current Status: Medically complicated by metastatic cancer to the brain and spine undergoing active XRT, cauda equina; flaccid bladder/urinary retention now with Foley catheter,, UTI, poor pain control, spasticity, hyper/hypotension, and cognitive deficits.  Barriers to Discharge: Behavior/Mood;Hypotension;Incontinence;Neurogenic  Bowel & Bladder;Medical stability;Self-care education;Renal Insufficiency/Failure;Uncontrolled Pain;Spasticity   Possible Resolutions to [employer]: Titrate pain and spasticity medications to least sedating regimen for function, continue XRT treatments, DC Foley trial in 1 week for flaccid bladder, titrate blood pressure medications, finish treatment for UTI   Continued Need for Acute Rehabilitation Level of Care: The patient requires daily medical management by a physician with specialized training in physical medicine and rehabilitation for the following reasons: Direction of a multidisciplinary physical rehabilitation program to maximize functional independence : Yes Medical management of patient stability for increased activity during participation in an intensive rehabilitation regime.: Yes Analysis of laboratory values and/or radiology reports with any subsequent need for medication adjustment and/or medical intervention. : Yes   I attest that I was present, lead the team conference, and concur with the assessment and plan of the team.   Andria Head Gayo 05/27/2024, 1026 am

## 2024-05-27 NOTE — Progress Notes (Signed)
 Physical Therapy Session Note  Patient Details  Name: Roger Black MRN: [id number] Date of Birth: 1955/09/24  Today's Date: 05/27/2024 PT Individual Time: 1303-1403 PT Individual Time Calculation (min): 60 min   Short Term Goals: Week 1:  PT Short Term Goal 1 (Week 1): Pt will complete bed mobility with minA consistently. PT Short Term Goal 1 - Progress (Week 1): Met PT Short Term Goal 2 (Week 1): Pt will complete sit to stand with minA consistently. PT Short Term Goal 2 - Progress (Week 1): Progressing toward goal PT Short Term Goal 3 (Week 1): Pt will complete bed to chair transfer with minA consistently. PT Short Term Goal 3 - Progress (Week 1): Progressing toward goal PT Short Term Goal 4 (Week 1): Pt will ambulate x100' with minA and LRAD. PT Short Term Goal 4 - Progress (Week 1): Met Week 2:  PT Short Term Goal 1 (Week 2): STGs = LTGs  Skilled Therapeutic Interventions/Progress Updates:    Pt presents up in w/c, agreeable to session. Discussed change in d/c date and overall progress - pt reporting he is more concerned about his family being prepared than him. Pt request to do therapy outside if possible. Transported outside via w/c total A. Performed gait with RW x 30' with decreased step length, CGA/min A with cues for upright posture and positioning of RW for functional mobility training and overall strengthening/endurance. Mild instability noted at times. Wife joined us  outside and discussed overall goals as well as progress today. Pt reiterated his decline last week and reports having some uneasiness with falling again. Returned back upstairs and performed car transfer (wife not present for this part) at overall CGA to min A level, pt required light assist for LLE management into the car with pt leaning back into chair to compensate and then S to bring legs out of car. Min A needed for sit > stand and slight knee buckle B but able to correct and complete transfer back to w/c  with min/CGA. Returned to room and all needs in reach with wife at bedside and safety belt donned.   Therapy Documentation Precautions:  Precautions Precautions: Fall Precaution/Restrictions Comments: BP <150 Restrictions Weight Bearing Restrictions Per Provider Order: No  Pain: Pain Assessment Pain Scale: 0-10 Pain Score: 8  Pain Location: Buttocks Pain Intervention(s): Medication (See eMAR) RN to provide at end of session and was premedicated.    Therapy/Group: Individual Therapy  [name] [name] [name] [name], PT, DPT, CBIS  05/27/2024, 2:47 PM

## 2024-05-27 NOTE — Progress Notes (Signed)
 Occupational Therapy Session Note  Patient Details  Name: Roger Black MRN: [id number] Date of Birth: 1956/05/10   Short Term Goals: Week 2:  OT Short Term Goal 1 (Week 2): STG= LTG d/t ELOS  Skilled Therapeutic Interventions/Progress Updates:  Occupational Therapist participated in the interdisciplinary team conference, providing clinical information regarding the patient's current status, treatment goals, and weekly focus, including any barriers that need to be addressed. Please see the Inpatient Rehabilitation Team Conference and Plan of Care Update for further details.  Therapy Documentation Precautions:  Precautions Precautions: Fall Precaution/Restrictions Comments: BP <150 Restrictions Weight Bearing Restrictions Per Provider Order: No  Therapy/Group: Individual Therapy  Nereida Habermann, OTR/L, MSOT  05/27/2024, 4:42 PM

## 2024-05-27 NOTE — Progress Notes (Signed)
 Occupational Therapy Session Note  Patient Details  Name: Roger Black MRN: [id number] Date of Birth: 09-Oct-1955  Today's Date: 05/27/2024 OT Individual Time: 9269-9158 and 1045-1200 OT Individual Time Calculation (min): 71 min and 75 min   Short Term Goals: Week 1:  OT Short Term Goal 1 (Week 1): Pt will use LRAD and don pants with min A OT Short Term Goal 1 - Progress (Week 1): Met OT Short Term Goal 2 (Week 1): Pt will complete stand pivot to the [facility] with min A OT Short Term Goal 2 - Progress (Week 1): Met OT Short Term Goal 3 (Week 1): Pt will complete toileting tasks with min A OT Short Term Goal 3 - Progress (Week 1): Met OT Short Term Goal 4 (Week 1): Pt will tolerate sitting up in the w/c for 2 hours to demo improved activity tolerance OT Short Term Goal 4 - Progress (Week 1): Progressing toward goal Week 2:  OT Short Term Goal 1 (Week 2): STG= LTG d/t ELOS   Skilled Therapeutic Interventions/Progress Updates:    Session 1: Pt bed level at time of session, 8/10 pain and nursing present during session for med pass including pain meds. Pt verbose and attempting to redirect throughout session to attend to tasks. Extended time for slow movements as well. Discussion with pt regarding his questions about cathing and bladder control, answered as able but recommended speaking with MD about concerns. Bed mob Supervision to sit EOB, sit <> stand MIN A at RW and SPT with MIN/CGA to wheelchair. Self propel to sink for extended oral hygiene 2/2 dentures. Pt talking through diagnosis and emotional support provided. Discussion for home adaptations and environmental changes/work simplification techniques for home as well. Pt up in wheelchair alarm on call bell in reach.   Session 2: Pt up in wheelchair at time of session, requesting to shave. Provided items needed and pt with no tremors this date, able to shave with supervision seated and extended time. Note pt verbose this session as well,  discussion for home modifications as well and speaking with insurance who pt reports will provide some modifications like grab bars in shower, etc. MD present as well for rounds. Pt self propelling partially outside distance this date, cues for sequencing and smoothness to assist in propulsion. OT resuming for time. In [place], having pt perform tasks wheelchair level for ordering and transaction tasks to practice from wheelchair level. Pt self propelling outside as well for short distances to practice on various surfaces. Back in room set up alarm on call bell in reach.   Therapy Documentation Precautions:  Precautions Precautions: Fall Precaution/Restrictions Comments: BP <150 Restrictions Weight Bearing Restrictions Per Provider Order: No    Therapy/Group: Individual Therapy  Chiquita JAYSON Hopping 05/27/2024, 7:19 AM

## 2024-05-28 ENCOUNTER — Encounter: Payer: Self-pay | Admitting: Radiation Oncology

## 2024-05-28 ENCOUNTER — Other Ambulatory Visit: Payer: Self-pay

## 2024-05-28 ENCOUNTER — Ambulatory Visit

## 2024-05-28 ENCOUNTER — Ambulatory Visit: Admitting: Radiation Oncology

## 2024-05-28 DIAGNOSIS — Z510 Encounter for antineoplastic radiation therapy: Secondary | ICD-10-CM | POA: Diagnosis not present

## 2024-05-28 LAB — GLUCOSE, CAPILLARY
Glucose-Capillary: 123 mg/dL — ABNORMAL HIGH (ref 70–99)
Glucose-Capillary: 119 mg/dL — ABNORMAL HIGH (ref 70–99)
Glucose-Capillary: 172 mg/dL — ABNORMAL HIGH (ref 70–99)
Glucose-Capillary: 148 mg/dL — ABNORMAL HIGH (ref 70–99)

## 2024-05-28 LAB — RAD ONC ARIA SESSION SUMMARY
Course Elapsed Days: 13
Plan Fractions Treated to Date: 10
Plan Prescribed Dose Per Fraction: 3 Gy
Plan Total Fractions Prescribed: 10
Plan Total Prescribed Dose: 30 Gy
Reference Point Dosage Given to Date: 30 Gy
Reference Point Session Dosage Given: 3 Gy
Session Number: 10

## 2024-05-28 LAB — FUNGUS CULTURE RESULT

## 2024-05-28 LAB — FUNGUS CULTURE WITH STAIN

## 2024-05-28 LAB — FUNGAL ORGANISM REFLEX

## 2024-05-28 MED ORDER — OXYCODONE HCL 5 MG PO TABS
5.0000 mg | ORAL_TABLET | ORAL | Status: DC | PRN
  Administered 2024-05-28 – 2024-06-03 (×13): 10 mg via ORAL
  Filled 2024-05-28 (×13): qty 2

## 2024-05-28 NOTE — Progress Notes (Signed)
  Radiation Oncology         [phone]) [phone] ________________________________  Name: Roger Black  FMW:[id number]  Date of Service: 05/28/24 DOB: Dec 24, 1955   Steroid Taper Instructions   You currently have a prescription for Dexamethasone 4 mg Tablets.   Beginning 05/30/24  Take a 4 mg tablet twice a day  Beginning 06/06/24: Take 1/2 of a tablet (which is 2 mg) twice a day  Beginning 06/13/24: Take 1/2 of a tablet (which is 2 mg) once a day  Beginning 06/20/24: Take 1/2 of a tablet (which is 2 mg) every other day and stop on 06/26/24.   Please call our office if you have any headaches, visual changes, uncontrolled movements, extremity weakness, nausea or vomiting, or white rash on the tongue. Please also continue your protonix while remaining on steroids.

## 2024-05-28 NOTE — Progress Notes (Signed)
 Occupational Therapy Session Note  Patient Details  Name: Roger Black MRN: [id number] Date of Birth: 08-Apr-1956  Today's Date: 05/28/2024 OT Individual Time: 0810-0907 OT Individual Time Calculation (min): 57 min    Short Term Goals: Week 2:  OT Short Term Goal 1 (Week 2): STG= LTG d/t ELOS  Skilled Therapeutic Interventions/Progress Updates:    Pt received supine with no c/o pain, agreeable to OT session. Family education session completed with pt and his wife, as well as step daughter who arrived near the end of the session. Verbal education provided re fall risk reduction, energy conservation strategies, home carryover of transfer training, ADLs, and IADLs. Discussed recommendations for good vs bad days- not walking in the home and just completing stand pivots. Demonstration and hands on training completed for pt performance of UB/LB bathing and dressing at min A level, toileting hygiene and transfers, and shower transfers. Provided education and demonstration on DME use recommendations at home- including BSC, TTB, and potentially a hospital bed. Pt completed dressing EOB with min A for LB, (S) UB. Sit > stands and stand pivots at CGA level today overall with the RW. Discussed safe body mechanics for caregivers and pt at home. Pt passed off to PT in room.     Therapy Documentation Precautions:  Precautions Precautions: Fall Precaution/Restrictions Comments: BP <150 Restrictions Weight Bearing Restrictions Per Provider Order: No  Therapy/Group: Individual Therapy  [name] 05/28/2024, 12:12 PM

## 2024-05-28 NOTE — Progress Notes (Signed)
 Speech Language Pathology Daily Session Note  Patient Details  Name: Roger Black MRN: [id number] Date of Birth: [birth year]  Today's Date: 05/28/2024 SLP Individual Time: 1030-1100 SLP Individual Time Calculation (min): 30 min  Short Term Goals: Week 2: SLP Short Term Goal 1 (Week 2): STG = LTG due to ELOS  Skilled Therapeutic Interventions:   Pt and wife greeted at bedside for tx targeting cognition and family education. Pt's wife was perseverative on equipment so SLP assisted the pt to ID potential resources available and substitutions as needed. He benefited from [facility] for problem solving and reasoning throughout. He also benefited from minA for recall of recent information/education from therapists. Additional questions were answered and direct hand off completed w/ RNT at the end of tx tasks. Recommend cont ST per POC.   Pain  None endorsed  Therapy/Group: Individual Therapy  [name] 05/28/2024, 4:44 PM

## 2024-05-28 NOTE — Progress Notes (Signed)
 I saw the patient today after his last treatment. He has had some improvement in his back pain since starting radiation. He states he is progressing well with PT and plans to go home on Tuesday. We discussed the response to therapy as far as continued pain improvement can be anticipated in the next 2-3 weeks. He is aware of the concerns we have of whether he can regain complete strength given that he was having symptoms of weakness for several months before we was found to have recurrent metastatic disease. He is aware that his situation is also very unique in that his surveillance per NCCN guidelines would not have picked up on his metastatic disease involving the brain or spinal cord. We discussed that we would recommend resuming his work up with outpatient PET scan and getting back into see [facility]. Melanee in [facility] for recommendations of next steps. He has also requested that his surveillance for his brain defer back to Dr. [name] in [place]. We discussed our team would recommend MRI scans of the brain with 3T MRI every 3 months. We will check in by phone in a few weeks to see how he's doing and ensure his care continues. I'll place a steroid taper in his chart under progress notes as well.     Donald KYM Husband, PAC

## 2024-05-28 NOTE — Progress Notes (Signed)
 PROGRESS NOTE   Subjective/Complaints: No events overnight.  Vital stable.  No complaints today.  Used to complain of 8 out of 10 pain throughout the night.  Urinary output appropriate, large bowel movement yesterday.  ROS:  Neg for Chest pain or SOB Positive for urinary retention, and bilateral hip pain/spasms, bilateral foot sensory reduction   Objective:   No results found.   Recent Labs    05/25/24 1526 05/26/24 0458  WBC 6.2 6.3  HGB 12.7* 13.2  HCT 37.8* 40.1  PLT 93* 84*   Recent Labs    05/25/24 1526 05/26/24 0458  NA 130* 135  K 4.5 5.1  CL 101 104  CO2 20* 19*  GLUCOSE 158* 127*  BUN 33* 30*  CREATININE 0.96 0.99  CALCIUM 8.2* 8.6*    Intake/Output Summary (Last 24 hours) at 05/28/2024 0826 Last data filed at 05/28/2024 0516 Gross per 24 hour  Intake --  Output 775 ml  Net -775 ml        Physical Exam: Vital Signs Blood pressure 137/89, pulse 98, temperature (!) 97.4 F (36.3 C), resp. rate 19, height 5' 8 (1.727 m), weight 91 kg, SpO2 98%.   Constitutional: No distress . Vital signs reviewed.  Laying in bed. HEENT: NCAT, EOMI, oral membranes moist. Neck: supple, no JVD. Cardiovascular: RRR without murmur. No JVD    Respiratory/Chest:  Clear to auscultation bilaterally.  No rales, rhonchi, or wheezing. GI/Abdomen: BS + hypoactive, non-tender, nondistended Ext: no clubbing, cyanosis, or edema Psych: Awake, alert, appropriate.  Jovial.  Skin: C/D/I. No apparent lesions. + Craniotomy-staples removed, well-approximated + PIV c/d/i  MSK:      No apparent deformity.        Neurologic exam:  Cognition: AAO to person, place, time + decreased concentration/awareness, appropriate with conversations but does not carryover well. + tangential   Insight: Fair insight Sensation:  reduced in bilateral lower extremities CN: L eye difficulty converging, otherwise intact--no  change Coordination:  Bilateral upper extremity loss of tone in hands with intention --no longer present on exam Spasticity: MAS 0 in all extremities.          Strength: Unchanged                LUE: 5/5 SA, 5/5 EF, 5/5 EE, 5/5 WE, 5/5 FF, 5/5 FA                RUE:  5/5 SA, 5/5 EF, 5/5 EE, 5/5 WE, 5/5 FF, 5/5 FA                RLE: 3-/5 HF, 3/5 KE, 4-/5  DF, 4-/5 PF                LLE: 3/5 HF, 3/5 KE, 1/5  DF, 2/5 PF--unchanged  Physical exam unchanged from the above on reexamination 05/28/24     Assessment/Plan: 1. Functional deficits which require 3+ hours per day of interdisciplinary therapy in a comprehensive inpatient rehab setting. Physiatrist is providing close team supervision and 24 hour management of active medical problems listed below. Physiatrist and rehab team continue to assess barriers to discharge/monitor patient progress toward functional and medical goals  Care Tool:  Bathing    Body parts bathed by patient: Right arm, Left arm, Abdomen, Chest, Front perineal area, Right upper leg, Left upper leg, Right lower leg, Left lower leg, Face   Body parts bathed by helper: Buttocks     Bathing assist Assist Level: Moderate Assistance - Patient 50 - 74%     Upper Body Dressing/Undressing Upper body dressing   What is the patient wearing?: Pull over shirt    Upper body assist Assist Level: Contact Guard/Touching assist    Lower Body Dressing/Undressing Lower body dressing      What is the patient wearing?: Underwear/pull up, Pants     Lower body assist Assist for lower body dressing: Moderate Assistance - Patient 50 - 74%     Toileting Toileting    Toileting assist Assist for toileting: Moderate Assistance - Patient 50 - 74%     Transfers Chair/bed transfer  Transfers assist     Chair/bed transfer assist level: Minimal Assistance - Patient > 75%     Locomotion Ambulation   Ambulation assist      Assist level: Contact Guard/Touching  assist Assistive device: Walker-rolling Max distance: 80'   Walk 10 feet activity   Assist     Assist level: Contact Guard/Touching assist Assistive device: Walker-rolling   Walk 50 feet activity   Assist Walk 50 feet with 2 turns activity did not occur: Safety/medical concerns  Assist level: Contact Guard/Touching assist Assistive device: Walker-rolling    Walk 150 feet activity   Assist Walk 150 feet activity did not occur: Safety/medical concerns         Walk 10 feet on uneven surface  activity   Assist Walk 10 feet on uneven surfaces activity did not occur: Safety/medical concerns         Wheelchair     Assist Is the patient using a wheelchair?: Yes Type of Wheelchair: Manual    Wheelchair assist level: Dependent - Patient 0% Max wheelchair distance: 150'    Wheelchair 50 feet with 2 turns activity    Assist        Assist Level: Dependent - Patient 0%   Wheelchair 150 feet activity     Assist      Assist Level: Dependent - Patient 0%   Blood pressure 137/89, pulse 98, temperature (!) 97.4 F (36.3 C), resp. rate 19, height 5' 8 (1.727 m), weight 91 kg, SpO2 98%.  Medical Problem List and Plan: 1. 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 Daughter will be moving in with them. Myoclonus in [place] remains challenging; Min A ADLs overall min-mod for transfers. Amb 15 ft with RW limited by pain. Mild-moderate cognitive deficits variable depending on XRT.   - 10-31: More confused today, hypotensive, leukocytosis of 15 concerning for infection.  Most likely cause is urinary given retention, but  will also check chest x-ray given cough.  IV fluids for AKI.  Notable that patient started radiation therapy for his brain on 10-29  -11/2 [facility] for left foot  - 11/4:  Fluctuating participation with myoclonus/jerking; improved this week. Goals are supervision for PT. UBD SPV, Min A with reacher LB. Jerking and cognition improving.    - Messaged Dr. Dewey with radiation oncology per patient request for many questions regarding locations of radiation therapy treatment, decision for how many treatments are required in which areas, and how to measure progress/response.  Also encouraged patient to ask these questions when he is at radiation oncology appointments, although he is more comfortable with physician reaching out to provider.  2.  Antithrombotics: -DVT/anticoagulation:  Pharmaceutical: Lovenox             -antiplatelet therapy: N/A  3. Pain Management/spasms: percocet prn.   - 10-24: Extreme difficulty tolerating therapies due to back and lower hip pain and spasms with mobility.  baclofen to 10 mg 3 times daily, add tizanidine 2 mg 3 times daily, schedule OxyContin 10 mg every 12 hours, continue as needed Percocet 5 mg every 4 hours.  - 10-25: Increased OxyContin to 15 mg twice daily, increase baclofen to 15 mg 3 times daily; immediately notified next therapy session the patient was getting lethargic with these changes, so reverted to prior  - 10-26: Patient pain much improved on current regimen.  Unfortunately, appears to be somewhat sedated and euphoric on OxyContin 10 mg twice daily.  Not using as needed oxycodone, will instead schedule 5 mg 2 times daily before therapies in addition to as needed and DC long-acting.   - 10/27: perhaps a little more pain today but cognitively seemed more appropriate compared to what's recorded- no changes to regimen  10-28: Pain under better control, continues with spasms but seems to be doing better overall.  10-30: Add Elavil 25 mg nightly for peripheral  nerve pain at nighttime; already optimized on duloxetine, Lyrica  - 10-31: DC Elavil; did work well for pain, once causes of encephalopathy treated as below May retrial.  Reduce baclofen to 5 mg 3 times daily due to AKI.  -11/1-2 Reports continued back pain, overall under control with oxycodone--tolerating current regimen, continue  - 11-5: Increase as needed oxycodone from 5 to 5 to 10 mg.  4. Mood/Behavior/Sleep: LCSW to follow for evaluation and support             --continue ambien prn.              -antipsychotic agents: N/A  - 10-29: Developing some confusion overnight.  Schedule Ambien as he is using it nightly, 5 mg.  Add as needed trazodone 50 mg nightly.  - 10-30: No reports of confusion overnight.  Dors is sleeping well.  - 10-31: Grossly encephalopathic today, workup reveals leukocytosis of 15, AKI 1.69, and positive UA.  Delirium precautions, treatment as below.   -11/2 DC ambien per patient request, bad dreams reported  - 11-4: Sleeping well at night, no current changes warranted.  5. Neuropsych/cognition: This patient may be intermittently  capable of making decisions on his own behalf. 6. Skin/Wound Care: Routine pressure relief measures.   10/27 dc staples today  7. Fluids/Electrolytes/Nutrition: Monitor I/O.   8.  Hyperglycemia due to steroids for lung cancer w/brain mets: XRT started today and at end of day as able to help with tolerance of therapy             --on  Decadron 4 mg TID and fasting BS 120-150 range.  --Will check A1c in am--5.7, prediabetic  fair control overall, continue to monitor  9. COPD: On Singulair with albuterol prn.  10. HTN: Monitor BP TID---on avapro and cardura.   - Monitor with therapies today  - 10-25: Increase Cardura for urinary retention to 3 mg nightly  - 10-26: Blood pressures trending low; reduce Avapro 300 to 150 mg daily.  May need to reduce Cardura again if BP does not tolerate.  10/27 hold Avapro--BP improved 10-28--monitor on  current regimen  10-31: Hypotension likely secondary to SIRS, give 1 L IV fluid bolus today, may need more fluids through the weekend.  Hold Cardura until BP improves.  - well controlled, continue current    05/28/2024    5:08 AM 05/27/2024    8:20 PM 05/27/2024   12:59 PM  Vitals with BMI  Systolic 137 117 882  Diastolic 89 76 83  Pulse 98 94 91    11. CAD: On Avapro and statin. Monitor for symptoms with increase in activity.              --continue to hold spironolactone and torsemide.    - weights stable Filed Weights   05/15/24 1727 05/16/24 1544 05/17/24 0504  Weight: 91.1 kg 91.1 kg 91 kg    12.  Hyponatremia: Na 132 @ admission-->131.  --Recheck BMET in am--stable 10/27 stable at 132 10-31: 131 today, likely secondary to AKI as below.  11-1 stable 134  13. Acute on chronic renal failure: Encourage fluid intake.  BUN/SCr 27/1.32-->61/1.38 -- Monitor potassium levels as baseline 4.9-5.0-->continue ARB for now.  - Recheck CMET in am--improving BUN 37, creatinine 1.14, potassium 4.5 -10/27 BUN/Cr back up to 62/1.6 today, k+ 5.1 - hold avapro for now, push fluids and recheck in AM 10-28: BUN down to 47, creatinine normalized 1.09.  Continue to encourage p.o. fluids. 10-31: Recurrent AKI today, creatinine 1.69.  NA 131, potassium 5.2.  Give 1 L IV fluids, reduce baclofen as above, repeat BMP tonight, depending on results may give additional 1 L.  Repeat BMP in AM. 11/1 BUN and Cr improved to 52/1.16--> back to baseline, creatinine 0.9  14.  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  - Baclofen 10 mg 3 times daily  - Tizanidine 2 mg 3 times daily  - 10-25: Start dantrolene 25 mg daily, after 1 week test LFTs and increase to 3 times daily if tolerated.  Add clonazepam 0.5 mg 3 times daily as needed for myoclonus.  May need to add VPA if myoclonus worsens.  10-26/27: Spasms much better controlled, has not needed to use clonazepam yet.   Trend.  10-28: Start low-dose Keppra 500 mg twice daily for upper extremity myoclonus--notable improvement 10/29!  10/31: More myoclonus noted today, complicated by AKI and likely UTI as below.  Treat underlying causes, if continues to be persistent would recommend EEG over the weekend.  -11/2 Not having any today, continue to monitor. If it reoccurs pt advised to let nursing know, can consider EEG   - 11-3/4: Reports no further myoclonus for the last 2 days.  Continue Keppra, if recurs will get EEG.  15.  Neurogenic bladder/urinary retention.  Likely secondary to cauda equina involvement, flaccid bladder.  - Ongoing requirement of intermittent straight cathing; increase Cardura to 3 mg nightly  - 10-26: Added Urecholine 10 mg 3 times daily--if no response with up titration/intolerable side effects, would transition towards patient learning intermittent straight cathing   -10/27 cathed for 800cc this am.    -urecholine just added, consider increase in AM to 25mg  if still retaining   -encouraged up to  BSC or toilet to empty  - 10-28: Increase frequency of cath to every 4 hours due to large volumes, increase Urecholine to 25 mg 3 times daily.  If no improvement with these measures, will need to drain patient on straight cathing versus place Foley based on upper extremity coordination and bladder volumes greater than 2.5 L/day.  - 10-29: Straight caths volumes better.  Continue Urecholine through today, renal ultrasound for structural cause assessment.  If abdominal cramping from Urecholine intolerable, will DC and discuss placement of Foley catheter  -10-30: Long discussion with patient, his wife regarding likely flaccid bladder secondary to cauda equina involvement.  Patient not agreeable to learning straight cathing at this time, wants Foley placed on discharge; will continue to discuss this with them  10-31: UTI associated with urinary retention, DC bethanechol.  Reducing Cardura due to hypotension  as above.  Called patient's wife to discuss Foley placement given IV fluids and already currently needing every 4 hour caths to avoid high volumes, went to voicemail, will need her consent before placement.  11/1 foley was started, continue Rocephin  11/2 Continue Rocephin, pansensitive ecoli--will reattempt DC Foley 1 week after placement if still at rehab.  Will need urology consult as outpatient  11-4: Vital stable, switch Rocephin to p.o. Keflex for 3 days.  16.  Constipation/diarrhea.  - 10-29: Liquid bowel movements yesterday after sorbitol.  Move MiraLAX to as needed, reduce Senokot S to nightly.  Monitor today due to possibility of abdominal cramping with Urecholine.  - 10-30: Patient endorsing constipation, will increase Senokot S2 1 tab twice daily and MiraLAX daily  10-31: No bowel movement, will give sorbitol 1 back tonight.  11/2 Sorbitol 30ml ordered--not given   10-31; increase MiraLAX to twice daily  Last bowel movement 11-4, large.  Continent.  17.  Leukocytosis, likely secondary to urinary tract infection  - Start ceftriaxone 2 g daily; follow cultures  - DC bethanechol as above  - Will discuss with wife Foley placement given already need for frequent caths, IV fluid as above  - Pending results of checks x-ray given cough to look for alternate causes  -11/1 WBC back to 9.6  11-3: WBC normalized.  18. Aspiration pneumonia  -Continue rocephin, SLP  -Afebrile  - 11/3: No concern for bedsde swallow   LOS: 13 days A FACE TO FACE EVALUATION WAS PERFORMED  Roger Black 05/28/2024, 8:26 AM

## 2024-05-28 NOTE — Progress Notes (Signed)
 Occupational Therapy Session Note  Patient Details  Name: Roger Black MRN: [id number] Date of Birth: 09-08-1955  Today's Date: 05/28/2024 OT Individual Time: 1400-1430 OT Individual Time Calculation (min): 30 min    Short Term Goals: Week 2:  OT Short Term Goal 1 (Week 2): STG= LTG d/t ELOS  Skilled Therapeutic Interventions/Progress Updates:      Therapy Documentation Precautions:  Precautions Precautions: Fall Precaution/Restrictions Comments: BP <150 Restrictions Weight Bearing Restrictions Per Provider Order: No General:  Pt seated in W/C upon OT arrival, agreeable to OT. Wife present.   Pain: no pain reported  Other Treatments: OT session focus on family and pt education. Pt concerned about CA prognosis and progress of tx. OT messaged MD for further clarification for pt and family. OT educating pt on ways to increase water intake throughout the day d/t BP management and safety. OT also educating pt on healthy habit changes in order to maintain health and strength. Pt and wife appreciative of information.    Pt seated in W/C at end of session with direct handoff to transport for CA tx.    Therapy/Group: Individual Therapy  Camie Hoe, OTD, OTR/L 05/28/2024, 4:49 PM

## 2024-05-28 NOTE — Plan of Care (Signed)
 Entered in error Problem: RH Problem Solving Goal: LTG Patient will demonstrate problem solving for (SLP) Description: LTG:  Patient will demonstrate problem solving for basic/complex daily situations with cues  (SLP) Flowsheets (Taken 05/28/2024 1646) LTG Patient will demonstrate problem solving for: Minimal Assistance - Patient > 75%  Downgraded d/t disease process and progress thus far  Problem: RH Memory Goal: LTG Patient will demonstrate ability for day to day (SLP) Description: LTG:   Patient will demonstrate ability for day to day recall/carryover during cognitive/linguistic activities with assist  (SLP) Flowsheets (Taken 05/28/2024 1646) LTG: Patient will demonstrate ability for day to day recall/carryover during cognitive/linguistic activities with assist (SLP): Minimal Assistance - Patient > 75%   Problem: RH Pre-functional/Other (Specify) Goal: RH LTG SLP (Specify) 1 Description: RH LTG SLP (Specify) 1 Outcome: Not Applicable

## 2024-05-28 NOTE — Progress Notes (Signed)
 Physical Therapy Session Note  Patient Details  Name: Roger Black MRN: [id number] Date of Birth: 11/20/1955  Today's Date: 05/28/2024 PT Individual Time: 0905-0945 PT Individual Time Calculation (min): 40 min   Today's Date: 05/28/2024 PT Individual Time: 8696-8640 PT Individual Time Calculation (min): 56 min   Short Term Goals: Week 2:  PT Short Term Goal 1 (Week 2): STGs = LTGs  Skilled Therapeutic Interventions/Progress Updates:     1st Session: Pt received seated in [facility] and agrees to therapy. No complaint of pain. Pt's wife and step-daughter, Roger Black, present for family education. PT provides education on pt's progress with therapy, episodes in which he has had BUE and BLE buckling, and recommendations for safe mobility following discharge. WC transport to gym. Pt performs car transfer without AD, with minA and cues for initiation, sequencing, and positioning. Pt's step-daughter assists to transfer from car back to [facility]. Pt takes seated rest break. Pt then ambulates x25 with [name] assisting and PT providing cues for safe guarding technique. Following rest break, pt stands to RW and transitions to stepping up onto bodyweight scale, requiring modA +2 with pt demonstrating slight buckling through Lt knee. Following, pt taken back to room and remains sitting in [facility] with all needs within reach.  2nd Session: Pt received supine in bed and agrees to therapy. No complaint of pain. Supine to sit slowly with bed feaures and cues for positioning at EOB. Pt performs sit to stand with RW and CGA, with cues for hand placement and initiation. Ambulatory transfer to [facility] with cues for positioning and safety. WC transport to gym. Pt performs stand step to Nustep with RW and same cues. Pt completes Nustep for endurance training. Pt completes x12:00 at workload of 5 with average steps per minute ~28. Following, pt ambulates x15' to mat table with minA and RW, with cues for sequencing. Pt performs ball tossing  activity seated on mat to challenge balance and coordination. Pt attempts ball tossing in standing to provide balance challenge without upper extremity support. Pt completes x1 toss and has complete buckling of legs, abruptly sitting back on mat. Pt performs additional seated tosses. Stand step back to WC with minA. Left seated with all needs within reach.   Therapy Documentation Precautions:  Precautions Precautions: Fall Precaution/Restrictions Comments: BP <150 Restrictions Weight Bearing Restrictions Per Provider Order: No   Therapy/Group: Individual Therapy  Elsie JAYSON Dawn, PT, DPT 05/28/2024, 3:44 PM

## 2024-05-28 NOTE — Progress Notes (Signed)
 Patient ID: Roger Black, male   DOB: 02-27-1956, 68 y.o.   MRN: [id number]   SW mt with pt, pt wife, and dtr in room to discuss family edu. No concerns per dtr as leaving. Pt wife would like for pt to have a hospital bed, and pt feels this is necessary since his legs are not as good as they have been. SW explained it is up to insurance to approve, and also need to confirm this is recommended. SW informed will confirm with team on DME and [facility] or Outpatient and will update.   Graeme Jude, MSW, LCSW Office: [phone] Cell: [phone] Fax: [phone]

## 2024-05-28 NOTE — Progress Notes (Signed)
 I called the patient's spouse as the patient is likely in his PT session this am to let her know we had received a messaged that the patient had questions about his radiotherapy. I encouraged her to call back if she would like to talk about some of these and I will try back this afternoon as well after he's done with his PT.

## 2024-05-29 LAB — CBC
HCT: 38.4 % — ABNORMAL LOW (ref 39.0–52.0)
Hemoglobin: 12.9 g/dL — ABNORMAL LOW (ref 13.0–17.0)
MCH: 32.7 pg (ref 26.0–34.0)
MCHC: 33.6 g/dL (ref 30.0–36.0)
MCV: 97.5 fL (ref 80.0–100.0)
Platelets: 78 K/uL — ABNORMAL LOW (ref 150–400)
RBC: 3.94 MIL/uL — ABNORMAL LOW (ref 4.22–5.81)
RDW: 13.2 % (ref 11.5–15.5)
WBC: 7.2 K/uL (ref 4.0–10.5)
nRBC: 0 % (ref 0.0–0.2)

## 2024-05-29 LAB — BASIC METABOLIC PANEL WITH GFR
Anion gap: 10 (ref 5–15)
BUN: 55 mg/dL — ABNORMAL HIGH (ref 8–23)
CO2: 22 mmol/L (ref 22–32)
Calcium: 8.4 mg/dL — ABNORMAL LOW (ref 8.9–10.3)
Chloride: 103 mmol/L (ref 98–111)
Creatinine, Ser: 1.09 mg/dL (ref 0.61–1.24)
GFR, Estimated: >60 mL/min (ref >60)
Glucose, Bld: 131 mg/dL — ABNORMAL HIGH (ref 70–99)
Potassium: 4.8 mmol/L (ref 3.5–5.1)
Sodium: 135 mmol/L (ref 135–145)

## 2024-05-29 MED ORDER — DEXAMETHASONE 2 MG PO TABS: 2.0000 mg | ORAL_TABLET | Freq: Every day | ORAL | Status: DC

## 2024-05-29 MED ORDER — DEXAMETHASONE 4 MG PO TABS
4.0000 mg | ORAL_TABLET | Freq: Three times a day (TID) | ORAL | Status: AC
  Administered 2024-05-29 (×2): 4 mg via ORAL
  Filled 2024-05-29 (×2): qty 1

## 2024-05-29 MED ORDER — DEXAMETHASONE 4 MG PO TABS
4.0000 mg | ORAL_TABLET | Freq: Two times a day (BID) | ORAL | Status: DC
  Administered 2024-05-30 – 2024-06-03 (×9): 4 mg via ORAL
  Filled 2024-05-29 (×9): qty 1

## 2024-05-29 MED ORDER — SODIUM CHLORIDE 0.9 % BOLUS PEDS
1000.0000 mL | Freq: Once | INTRAVENOUS | Status: AC
  Administered 2024-05-29: 1000 mL via INTRAVENOUS

## 2024-05-29 MED ORDER — DEXAMETHASONE 2 MG PO TABS: 2.0000 mg | ORAL_TABLET | ORAL | Status: DC

## 2024-05-29 MED ORDER — DEXAMETHASONE 2 MG PO TABS: 2.0000 mg | ORAL_TABLET | Freq: Two times a day (BID) | ORAL | Status: DC

## 2024-05-29 MED ORDER — TAMSULOSIN HCL 0.4 MG PO CAPS
0.4000 mg | ORAL_CAPSULE | Freq: Every day | ORAL | Status: DC
  Administered 2024-05-29 – 2024-06-02 (×5): 0.4 mg via ORAL
  Filled 2024-05-29 (×5): qty 1

## 2024-05-29 NOTE — Progress Notes (Signed)
 Patient ID: Roger Black, male   DOB: 1956-03-29, 68 y.o.   MRN: [id number]  DME ordered- w/c, hospital bed and 3in1 BSC with [employer] via parachute.   SW met with pt in room to inform on above.   Graeme Jude, MSW, LCSW Office: [phone] Cell: [phone] Fax: [phone]

## 2024-05-29 NOTE — Progress Notes (Signed)
 PROGRESS NOTE   Subjective/Complaints:  Doing well overall.  Is very happy regarding his conversation with the radiation oncology PA yesterday, had several questions answered regarding progression of his treatment and monitoring moving forward.  Still recording 10/10 pain overnight; patient states his symptoms are well-controlled. Vitals stable AM labs with BUN 55, Cr 1.09; otherwise stable   ROS:  Neg for Chest pain or SOB Positive for urinary retention, and bilateral hip pain/spasms, bilateral foot sensory reduction   Objective:   No results found.   Recent Labs    05/29/24 0446  WBC 7.2  HGB 12.9*  HCT 38.4*  PLT 78*   Recent Labs    05/29/24 0446  NA 135  K 4.8  CL 103  CO2 22  GLUCOSE 131*  BUN 55*  CREATININE 1.09  CALCIUM 8.4*    Intake/Output Summary (Last 24 hours) at 05/29/2024 1000 Last data filed at 05/29/2024 0723 Gross per 24 hour  Intake 480 ml  Output 1000 ml  Net -520 ml        Physical Exam: Vital Signs Blood pressure 113/77, pulse 84, temperature 97.6 F (36.4 C), resp. rate 16, height 5' 8 (1.727 m), weight 91 kg, SpO2 97%.   Constitutional: No distress . Vital signs reviewed.  Sitting upright in bedside chair. HEENT: NCAT, EOMI, oral membranes moist. Neck: supple, no JVD. Cardiovascular: RRR without murmur. No JVD    Respiratory/Chest:  Clear to auscultation bilaterally.  No rales, rhonchi, or wheezing. GI/Abdomen: BS + hypoactive, non-tender, nondistended Ext: no clubbing, cyanosis, or edema Psych: Awake, alert, appropriate.  Jovial.  Skin: C/D/I. No apparent lesions. + Craniotomy-staples removed, well-approximated + PIV c/d/i  MSK:      No apparent deformity.        Neurologic exam:  Cognition: AAO to person, place, time + decreased concentration/awareness, appropriate with conversations but does not carryover well. + tangential   Insight: Fair  insight Sensation:  reduced in bilateral lower extremities CN: L eye difficulty converging, otherwise intact--no change Coordination:  Bilateral upper extremity loss of tone in hands with intention --no longer present on exam Spasticity: MAS 0 in all extremities.          Strength: Unchanged                LUE: 5/5 SA, 5/5 EF, 5/5 EE, 5/5 WE, 5/5 FF, 5/5 FA                RUE:  5/5 SA, 5/5 EF, 5/5 EE, 5/5 WE, 5/5 FF, 5/5 FA                RLE: 3-/5 HF, 3/5 KE, 4-/5  DF, 4-/5 PF                LLE: 3/5 HF, 3/5 KE, 1/5  DF, 2/5 PF--unchanged  Physical exam unchanged from the above on reexamination 05/29/24     Assessment/Plan: 1. Functional deficits which require 3+ hours per day of interdisciplinary therapy in a comprehensive inpatient rehab setting. Physiatrist is providing close team supervision and 24 hour management of active medical problems listed below. Physiatrist and rehab team continue to assess barriers to  discharge/monitor patient progress toward functional and medical goals  Care Tool:  Bathing    Body parts bathed by patient: Right arm, Left arm, Abdomen, Chest, Front perineal area, Right upper leg, Left upper leg, Right lower leg, Left lower leg, Face   Body parts bathed by helper: Buttocks     Bathing assist Assist Level: Moderate Assistance - Patient 50 - 74%     Upper Body Dressing/Undressing Upper body dressing   What is the patient wearing?: Pull over shirt    Upper body assist Assist Level: Contact Guard/Touching assist    Lower Body Dressing/Undressing Lower body dressing      What is the patient wearing?: Underwear/pull up, Pants     Lower body assist Assist for lower body dressing: Moderate Assistance - Patient 50 - 74%     Toileting Toileting    Toileting assist Assist for toileting: Moderate Assistance - Patient 50 - 74%     Transfers Chair/bed transfer  Transfers assist     Chair/bed transfer assist level: Minimal Assistance -  Patient > 75%     Locomotion Ambulation   Ambulation assist      Assist level: Contact Guard/Touching assist Assistive device: Walker-rolling Max distance: 80'   Walk 10 feet activity   Assist     Assist level: Contact Guard/Touching assist Assistive device: Walker-rolling   Walk 50 feet activity   Assist Walk 50 feet with 2 turns activity did not occur: Safety/medical concerns  Assist level: Contact Guard/Touching assist Assistive device: Walker-rolling    Walk 150 feet activity   Assist Walk 150 feet activity did not occur: Safety/medical concerns         Walk 10 feet on uneven surface  activity   Assist Walk 10 feet on uneven surfaces activity did not occur: Safety/medical concerns         Wheelchair     Assist Is the patient using a wheelchair?: Yes Type of Wheelchair: Manual    Wheelchair assist level: Dependent - Patient 0% Max wheelchair distance: 150'    Wheelchair 50 feet with 2 turns activity    Assist        Assist Level: Dependent - Patient 0%   Wheelchair 150 feet activity     Assist      Assist Level: Dependent - Patient 0%   Blood pressure 113/77, pulse 84, temperature 97.6 F (36.4 C), resp. rate 16, height 5' 8 (1.727 m), weight 91 kg, SpO2 97%.  Medical Problem List and Plan: 1. 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 Daughter will be moving in with them. Myoclonus in [place] remains challenging; Min A ADLs overall min-mod for transfers. Amb 15 ft with RW limited by pain. Mild-moderate cognitive deficits variable depending on XRT.   - 10-31: More confused today,  hypotensive, leukocytosis of 15 concerning for infection.  Most likely cause is urinary given retention, but will also check chest x-ray given cough.  IV fluids for AKI.  Notable that patient started radiation therapy for his brain on 10-29  -  11/2 PRAFO for left foot  - 11/4: Fluctuating participation with myoclonus/jerking; improved this week. Goals are supervision for PT. UBD SPV, Min A with reacher LB. Jerking and cognition improving.    - Messaged Dr. Dewey with radiation oncology per patient request-PA was kind enough to talk to him yesterday, addressed his current treatments and monitoring moving forward.  Very much appreciate her taking the time to do this!  2.  Antithrombotics: -DVT/anticoagulation:  Pharmaceutical: Lovenox             -antiplatelet therapy: N/A  3. Pain Management/spasms: percocet prn.   - 10-24: Extreme difficulty tolerating therapies due to back and lower hip pain and spasms with mobility.  baclofen to 10 mg 3 times daily, add tizanidine 2 mg 3 times daily, schedule OxyContin 10 mg every 12 hours, continue as needed Percocet 5 mg every 4 hours.  - 10-25: Increased OxyContin to 15 mg twice daily, increase baclofen to 15 mg 3 times daily; immediately notified next therapy session the patient was getting lethargic with these changes, so reverted to prior  - 10-26: Patient pain much improved on current regimen.  Unfortunately, appears to be somewhat sedated and euphoric on OxyContin 10 mg twice daily.  Not using as needed oxycodone, will instead schedule 5 mg 2 times daily before therapies in addition to as needed and DC long-acting.   - 10/27: perhaps a little more pain today but cognitively seemed more appropriate compared to what's recorded- no changes to regimen  10-28: Pain under better control, continues with spasms but seems to be doing better overall.  10-30: Add Elavil 25 mg nightly for peripheral nerve pain at nighttime; already optimized on duloxetine, Lyrica  -  10-31: DC Elavil; did work well for pain, once causes of encephalopathy treated as below May retrial.  Reduce baclofen to 5 mg 3 times daily due to AKI.  -11/1-2 Reports continued back pain, overall under control with oxycodone--tolerating current regimen, continue  - 11-5: Increase as needed oxycodone from 5 to 5 to 10 mg--well-controlled on current regimen  4. Mood/Behavior/Sleep: LCSW to follow for evaluation and support             --continue ambien prn.              -antipsychotic agents: N/A  - 10-29: Developing some confusion overnight.  Schedule Ambien as he is using it nightly, 5 mg.  Add as needed trazodone 50 mg nightly.  - 10-30: No reports of confusion overnight.  Dors is sleeping well.  - 10-31: Grossly encephalopathic today, workup reveals leukocytosis of 15, AKI 1.69, and positive UA.  Delirium precautions, treatment as below.   -11/2 DC ambien per patient request, bad dreams reported  - 11-4: Sleeping well at night, no current changes warranted.   - 11/6: not using trazodone  5. Neuropsych/cognition: This patient may be intermittently  capable of making decisions on his own behalf. 6. Skin/Wound Care: Routine pressure relief measures.   10/27 dc staples today  7. Fluids/Electrolytes/Nutrition: Monitor I/O.   8.  Hyperglycemia due to steroids for lung cancer w/brain mets: XRT started today and at end of day as able to help with tolerance of therapy             --on  Decadron 4 mg TID and fasting BS 120-150 range.  --Will check A1c in am--5.7, prediabetic  fair control overall, continue to monitor  9. COPD: On Singulair with albuterol prn.  10. HTN: Monitor BP TID---on  avapro and cardura.   - Monitor with therapies today  - 10-25: Increase Cardura for urinary retention to 3 mg nightly  - 10-26: Blood pressures trending low; reduce Avapro 300 to 150 mg daily.  May need to reduce Cardura again if BP does not tolerate.  10/27 hold Avapro--BP improved 10-28--monitor on current  regimen  10-31: Hypotension likely secondary to SIRS, give 1 L IV fluid bolus today, may need more fluids through the weekend.  Hold Cardura until BP improves.  - well controlled, continue current    05/29/2024    7:29 AM 05/29/2024    5:08 AM 05/28/2024    8:10 PM  Vitals with BMI  Systolic  113 110  Diastolic  77 67  Pulse 84 86 85    11. CAD: On Avapro and statin. Monitor for symptoms with increase in activity.              --continue to hold spironolactone and torsemide.    - weights stable Filed Weights   05/15/24 1727 05/16/24 1544 05/17/24 0504  Weight: 91.1 kg 91.1 kg 91 kg    12.  Hyponatremia: Na 132 @ admission-->131.  --Recheck BMET in am--stable 10/27 stable at 132 10-31: 131 today, likely secondary to AKI as below.  11-1 stable 134  13. Acute on chronic renal failure: Encourage fluid intake.  BUN/SCr 27/1.32-->61/1.38 -- Monitor potassium levels as baseline 4.9-5.0-->continue ARB for now.  - Recheck CMET in am--improving BUN 37, creatinine 1.14, potassium 4.5 -10/27 BUN/Cr back up to 62/1.6 today, k+ 5.1 - hold avapro for now, push fluids and recheck in AM 10-28: BUN down to 47, creatinine normalized 1.09.  Continue to encourage p.o. fluids. 10-31: Recurrent AKI today, creatinine 1.69.  NA 131, potassium 5.2.  Give 1 L IV fluids, reduce baclofen as above, repeat BMP tonight, depending on results may give additional 1 L.  Repeat BMP in AM. 11/1 BUN and Cr improved to 52/1.16--> back to baseline, creatinine 0.9 11/6: BUN up to 55 - give I L IVF, push POs, repeat in AM  14.  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  - Baclofen 10 mg 3 times daily  - Tizanidine 2 mg 3 times daily  - 10-25: Start dantrolene 25 mg daily, after 1 week test LFTs and increase to 3 times daily if tolerated.  Add clonazepam 0.5 mg 3 times daily as needed for myoclonus.  May need to add VPA if myoclonus worsens.  10-26/27: Spasms much better controlled,  has not needed to use clonazepam yet.  Trend.  10-28: Start low-dose Keppra 500 mg twice daily for upper extremity myoclonus--notable improvement 10/29!  10/31: More myoclonus noted today, complicated by AKI and likely UTI as below.  Treat underlying causes, if continues to be persistent would recommend EEG over the weekend.  -11/2 Not having any today, continue to monitor. If it reoccurs pt advised to let nursing know, can consider EEG   - 11-3/4: Reports no further myoclonus for the last 2 days.  Continue Keppra, if recurs will get EEG.  15.  Neurogenic bladder/urinary retention.  Likely secondary to cauda equina involvement, flaccid bladder.  - Ongoing requirement of intermittent straight cathing; increase Cardura to 3 mg nightly  - 10-26: Added Urecholine 10 mg 3 times daily--if no response with up titration/intolerable side effects, would transition towards patient learning intermittent straight cathing   -10/27 cathed for 800cc this am.    -urecholine just added, consider increase in  AM to 25mg  if still retaining   -encouraged up to [facility] or toilet to empty  - 10-28: Increase frequency of cath to every 4 hours due to large volumes, increase Urecholine to 25 mg 3 times daily.  If no improvement with these measures, will need to drain patient on straight cathing versus place Foley based on upper extremity coordination and bladder volumes greater than 2.5 L/day.  - 10-29: Straight caths volumes better.  Continue Urecholine through today, renal ultrasound for structural cause assessment.  If abdominal cramping from Urecholine intolerable, will DC and discuss placement of Foley catheter  -10-30: Long discussion with patient, his wife regarding likely flaccid bladder secondary to cauda equina involvement.  Patient not agreeable to learning straight cathing at this time, wants Foley placed on discharge; will continue to discuss this with them  10-31: UTI associated with urinary retention, DC bethanechol.   Reducing Cardura due to hypotension as above.  Called patient's wife to discuss Foley placement given IV fluids and already currently needing every 4 hour caths to avoid high volumes, went to voicemail, will need her consent before placement.  11/1 foley was started, continue Rocephin  11/2 Continue Rocephin, pansensitive ecoli--will reattempt DC Foley 1 week after placement if still at rehab.  Will need urology consult as outpatient  11-4: Vital stable, switch Rocephin to p.o. Keflex for 3 days.  11-6: Patient agreeable to DC Foley trial starting tomorrow, for 48 hours .  Resume Flomax 0.4 mg today.  16.  Constipation/diarrhea.  - 10-29: Liquid bowel movements yesterday after sorbitol.  Move MiraLAX to as needed, reduce Senokot S to nightly.  Monitor today due to possibility of abdominal cramping with Urecholine.  - 10-30: Patient endorsing constipation, will increase Senokot S2 1 tab twice daily and MiraLAX daily  10-31: No bowel movement, will give sorbitol 1 back tonight.  11/2 Sorbitol 30ml ordered--not given   10-31; increase MiraLAX to twice daily  Last bowel movement 11-4, large.  Continent.  17.  Leukocytosis, likely secondary to urinary tract infection  - Start ceftriaxone 2 g daily; follow cultures  - DC bethanechol as above  - Will discuss with wife Foley placement given already need for frequent caths, IV fluid as above  - Pending results of checks x-ray given cough to look for alternate causes  -11/1 WBC back to 9.6  11-3: WBC normalized.  18. Aspiration pneumonia  -Continue rocephin, SLP  -Afebrile  - 11/3: No concern for bedsde swallow   LOS: 14 days A FACE TO FACE EVALUATION WAS PERFORMED  Joesph JAYSON Likes 05/29/2024, 10:00 AM

## 2024-05-29 NOTE — Plan of Care (Signed)
  Problem: Consults Goal: RH BRAIN INJURY PATIENT EDUCATION Description: Description: See Patient Education module for eduction specifics Outcome: Progressing   Problem: RH BOWEL ELIMINATION Goal: RH STG MANAGE BOWEL WITH ASSISTANCE Description: STG Manage Bowel with supervision Assistance. Outcome: Progressing   Problem: RH BLADDER ELIMINATION Goal: RH STG MANAGE BLADDER WITH ASSISTANCE Description: STG Manage Bladder With supervision Assistance Outcome: Progressing   Problem: RH SKIN INTEGRITY Goal: RH STG SKIN FREE OF INFECTION/BREAKDOWN Description: Manage skin free of infection with supervision assistance Outcome: Progressing   Problem: RH SAFETY Goal: RH STG ADHERE TO SAFETY PRECAUTIONS W/ASSISTANCE/DEVICE Description: STG Adhere to Safety Precautions With supervision Assistance/Device. Outcome: Progressing   Problem: RH PAIN MANAGEMENT Goal: RH STG PAIN MANAGED AT OR BELOW PT'S PAIN GOAL Description: <4 w/ prns Outcome: Progressing   Problem: RH KNOWLEDGE DEFICIT BRAIN INJURY Goal: RH STG INCREASE KNOWLEDGE OF SELF CARE AFTER BRAIN INJURY Description: Manage increase knowledge of self care after brain injury with supervision assistance from wife using educational materials provided Outcome: Progressing   Problem: Education: Goal: Knowledge of General Education information will improve Description: Including pain rating scale, medication(s)/side effects and non-pharmacologic comfort measures Outcome: Progressing   Problem: Health Behavior/Discharge Planning: Goal: Ability to manage health-related needs will improve Outcome: Progressing   Problem: Clinical Measurements: Goal: Ability to maintain clinical measurements within normal limits will improve Outcome: Progressing Goal: Will remain free from infection Outcome: Progressing Goal: Diagnostic test results will improve Outcome: Progressing Goal: Respiratory complications will improve Outcome:  Progressing Goal: Cardiovascular complication will be avoided Outcome: Progressing   Problem: Activity: Goal: Risk for activity intolerance will decrease Outcome: Progressing   Problem: Nutrition: Goal: Adequate nutrition will be maintained Outcome: Progressing   Problem: Coping: Goal: Level of anxiety will decrease Outcome: Progressing   Problem: Elimination: Goal: Will not experience complications related to bowel motility Outcome: Progressing Goal: Will not experience complications related to urinary retention Outcome: Progressing   Problem: Pain Managment: Goal: General experience of comfort will improve and/or be controlled Outcome: Progressing   Problem: Safety: Goal: Ability to remain free from injury will improve Outcome: Progressing   Problem: Skin Integrity: Goal: Risk for impaired skin integrity will decrease Outcome: Progressing

## 2024-05-29 NOTE — Progress Notes (Signed)
 Physical Therapy Session Note  Patient Details  Name: Roger Black MRN: [id number] Date of Birth: [birth year]  Today's Date: 05/29/2024 PT Individual Time: 1106-1200 PT Individual Time Calculation (min): 54 min   Short Term Goals: Week 1:  PT Short Term Goal 1 (Week 1): Pt will complete bed mobility with minA consistently. PT Short Term Goal 1 - Progress (Week 1): Met PT Short Term Goal 2 (Week 1): Pt will complete sit to stand with minA consistently. PT Short Term Goal 2 - Progress (Week 1): Progressing toward goal PT Short Term Goal 3 (Week 1): Pt will complete bed to chair transfer with minA consistently. PT Short Term Goal 3 - Progress (Week 1): Progressing toward goal PT Short Term Goal 4 (Week 1): Pt will ambulate x100' with minA and LRAD. PT Short Term Goal 4 - Progress (Week 1): Met Week 2:  PT Short Term Goal 1 (Week 2): STGs = LTGs  Skilled Therapeutic Interventions/Progress Updates:    Pt presents up in w/c and agreeable to session, finishing up with MD. Pt performed w/c propulsion for BUE strengthening and endurance and mobility training in hallways x 150' with supervision. Focused on NMR to address dynamic standing balance during functional reaching tasks to R and L sides - performed on compliant surface to increase challenge x 2 trials to each side with seated rest breaks needed due to fatigue. Pt requires 1UE support throughout to maintain balance and cues for knee and trunk extension. No overt buckling but does report instability. Requires CGA overall for balance with 1 UE support. Pt request to go outside - completed therapy session outdoors for overall mood/outlook. Completed 15 reps each with 4# straight weight for bicep curls, overhead press and rows for functional UE strengthening and muscular endurance training. Blocked practice sit <> Stands for functional strengthening to increase overall transfers x 5 reps with CGA and cues for technique. Returned to room with all  needs in reach and set up in w/c.   Therapy Documentation Precautions:  Precautions Precautions: Fall Precaution/Restrictions Comments: BP <150 Restrictions Weight Bearing Restrictions Per Provider Order: No    Pain:  No complaints of pain overall.     Therapy/Group: Individual Therapy  [name] [name] [name] [name], PT, DPT, CBIS  05/29/2024, 12:06 PM

## 2024-05-29 NOTE — Progress Notes (Signed)
 Physical Therapy Session Note  Patient Details  Name: Roger Black MRN: [id number] Date of Birth: [birth year]  Today's Date: 05/29/2024 PT Individual Time: 1346-1430 PT Individual Time Calculation (min): 44 min   Short Term Goals: Week 2:  PT Short Term Goal 1 (Week 2): STGs = LTGs  Skilled Therapeutic Interventions/Progress Updates:     Pt received seated in WC and agrees to therapy. Reports pain in buttocks from prolonged sitting. PT provides upright mobility to address pain. Pt self propels WC x150' to gym for endurance and mobility training. Pt performs sit to stand with cues for hand placement and sequencing. Pt ambulates x150', primarily with CGA and cues for upright gaze to improve posture and balance, though pt has buckling in both knees as he approaches WC, requiring minA to guide hips safely back to WC. Pt attributes buckling to backs of knees contacting WC. Seated rest break. Pt trials standing without upper extremity support to challenge balance and lower extremity strength. Pt requires consistent minA to prevent anterior LOB, with notable instability in both legs without upper extremity support. Pt self propels WC back to room. Stand step to bed with CGA and cues for positioning. Pt able to complete sit to supine without assistance. Left supine in bed with all needs within reach.   Therapy Documentation Precautions:  Precautions Precautions: Fall Precaution/Restrictions Comments: BP <150 Restrictions Weight Bearing Restrictions Per Provider Order: No    Therapy/Group: Individual Therapy  Elsie JAYSON Dawn, PT, DPT 05/29/2024, 3:41 PM

## 2024-05-29 NOTE — Progress Notes (Signed)
 Occupational Therapy Session Note  Patient Details  Name: Roger Black MRN: [id number] Date of Birth: 1956/03/01  Today's Date: 05/29/2024 OT Individual Time: 1502-1530 OT Individual Time Calculation (min): 28 min    Short Term Goals: Week 2:  OT Short Term Goal 1 (Week 2): STG= LTG d/t ELOS  Skilled Therapeutic Interventions/Progress Updates:      Therapy Documentation Precautions:  Precautions Precautions: Fall Precaution/Restrictions Comments: BP <150 Restrictions Weight Bearing Restrictions Per Provider Order: No General: Pt supine in bed upon OT arrival, agreeable to OT session. During beginning minutes of session, IV team came to room to place IV for fluids. Actual times in room are [phone] and [phone].   Pain: no pain reported  Exercises: Pt completed the following exercise circuit in order to improve functional activity, strength and endurance to prepare for ADLs such as bathing. Pt completed the following exercises in supine position with no noted LOB/SOB and 3x10 repetitions on each exercise: -bicep curls -triceps extensions -shoulder abduction -shoulder flexion  Pt supine in bed with bed alarm activated, 2 bed rails up, call light within reach and 4Ps assessed.   Therapy/Group: Individual Therapy  Camie Hoe, OTD, OTR/L 05/29/2024, 4:15 PM

## 2024-05-29 NOTE — Progress Notes (Signed)
 Occupational Therapy Session Note  Patient Details  Name: Roger Black MRN: [id number] Date of Birth: 27-Jun-1956  Today's Date: 05/29/2024 OT Individual Time: 9161-9047 OT Individual Time Calculation (min): 74 min    Short Term Goals: Week 2:  OT Short Term Goal 1 (Week 2): STG= LTG d/t ELOS  Skilled Therapeutic Interventions/Progress Updates:  Pt greeted supine in bed, pt agreeable to OT intervention.      Transfers/bed mobility/functional mobility:  Pt completed supine>sit with supervision. Pt completed sit>stand from EOB with RW and CGA. Pt completed stand pivot transfer to w/c with RW and CGA.  Therapeutic activity:  Pt completed various dynamic balance tasks at fall festival with a focus on standing tolerance, dynamic balance and improved hand/eye coordination. Pt completed all tasks with CGA with unilateral support.   ADLs:  Grooming: pt completed seated oral care at sink MODI. Pt also completed seated shaving at sink MODI.                  Ended session with pt seated in w/c with all needs within reach.  Therapy Documentation Precautions:  Precautions Precautions: Fall Precaution/Restrictions Comments: BP <150 Restrictions Weight Bearing Restrictions Per Provider Order: No  Pain: No pain    Therapy/Group: Individual Therapy  Ronal Gift [facility] 05/29/2024, 12:02 PM

## 2024-05-30 ENCOUNTER — Ambulatory Visit: Admitting: Radiation Oncology

## 2024-05-30 NOTE — Progress Notes (Signed)
 Occupational Therapy Session Note  Patient Details  Name: Roger Black MRN: [id number] Date of Birth: 04-26-1956  Today's Date: 05/30/2024 OT Individual Time: 1300-1330 OT Individual Time Calculation (min): 30 min    Short Term Goals: Week 2:  OT Short Term Goal 1 (Week 2): STG= LTG d/t ELOS  Skilled Therapeutic Interventions/Progress Updates:      Therapy Documentation Precautions:  Precautions Precautions: Fall Precaution/Restrictions Comments: BP <150 Restrictions Weight Bearing Restrictions Per Provider Order: No General:  Pt seated in W/C upon OT arrival, agreeable to OT.  Pain: no pain reported  Exercises: Pt completed 12 minutes of arm bike in order to increase BUE strength and endurance in preparation for increased independence in ADLs such as functional mobility. Rest break after 5 min, on hills mode resistance varied from from 3-7 with good pacing and energy conservation strategies.  Other Treatments: OT discussing house layout in order to increase independence for mobility. Pt reporting small threshold from garage to inside. OT educating on incline to put over threshold for increased safety and independence.    Pt seated in W/C at end of session with W/C alarm donned, call light within reach and 4Ps assessed.    Therapy/Group: Individual Therapy  Camie Hoe, OTD, OTR/L 05/30/2024, 3:47 PM

## 2024-05-30 NOTE — Progress Notes (Signed)
 Foley removed at 1700. Pt is DTV at 2100.

## 2024-05-30 NOTE — Plan of Care (Signed)
 Goals downgraded due to CLOF and anticipated LOS.   Problem: RH Balance Goal: LTG Patient will maintain dynamic standing with ADLs (OT) Description: LTG:  Patient will maintain dynamic standing balance with assist during activities of daily living (OT)  Flowsheets (Taken 05/30/2024 1253) LTG: Pt will maintain dynamic standing balance during ADLs with: Contact Guard/Touching assist   Problem: Sit to Stand Goal: LTG:  Patient will perform sit to stand in prep for activites of daily living with assistance level (OT) Description: LTG:  Patient will perform sit to stand in prep for activites of daily living with assistance level (OT) Flowsheets (Taken 05/30/2024 1253) LTG: PT will perform sit to stand in prep for activites of daily living with assistance level: Contact Guard/Touching assist   Problem: RH Bathing Goal: LTG Patient will bathe all body parts with assist levels (OT) Description: LTG: Patient will bathe all body parts with assist levels (OT) Flowsheets (Taken 05/30/2024 1253) LTG: Pt will perform bathing with assistance level/cueing: Minimal Assistance - Patient > 75%   Problem: RH Dressing Goal: LTG Patient will perform lower body dressing w/assist (OT) Description: LTG: Patient will perform lower body dressing with assist, with/without cues in positioning using equipment (OT) Flowsheets (Taken 05/30/2024 1253) LTG: Pt will perform lower body dressing with assistance level of: Minimal Assistance - Patient > 75%   Problem: RH Toileting Goal: LTG Patient will perform toileting task (3/3 steps) with assistance level (OT) Description: LTG: Patient will perform toileting task (3/3 steps) with assistance level (OT)  Flowsheets (Taken 05/30/2024 1253) LTG: Pt will perform toileting task (3/3 steps) with assistance level: Minimal Assistance - Patient > 75%   Problem: RH Toilet Transfers Goal: LTG Patient will perform toilet transfers w/assist (OT) Description: LTG: Patient will perform  toilet transfers with assist, with/without cues using equipment (OT) Flowsheets (Taken 05/30/2024 1253) LTG: Pt will perform toilet transfers with assistance level of: Contact Guard/Touching assist   Problem: RH Tub/Shower Transfers Goal: LTG Patient will perform tub/shower transfers w/assist (OT) Description: LTG: Patient will perform tub/shower transfers with assist, with/without cues using equipment (OT) Flowsheets (Taken 05/30/2024 1253) LTG: Pt will perform tub/shower stall transfers with assistance level of: Contact Guard/Touching assist

## 2024-05-30 NOTE — Progress Notes (Signed)
 PROGRESS NOTE   Subjective/Complaints:  No acute complaints.  No events overnight.  Back pain generally stable, feels he is sleeping well.  Is somewhat hesitant about DC Foley trial today, reassured him and it would only be for 48 hours if he failed.  ROS:  Neg for Chest pain or SOB Positive for urinary retention, and bilateral hip pain/spasms, bilateral foot sensory reduction   Objective:   No results found.   Recent Labs    05/29/24 0446  WBC 7.2  HGB 12.9*  HCT 38.4*  PLT 78*   Recent Labs    05/29/24 0446  NA 135  K 4.8  CL 103  CO2 22  GLUCOSE 131*  BUN 55*  CREATININE 1.09  CALCIUM 8.4*    Intake/Output Summary (Last 24 hours) at 05/30/2024 1711 Last data filed at 05/30/2024 1400 Gross per 24 hour  Intake 762.1 ml  Output 1245 ml  Net -482.9 ml        Physical Exam: Vital Signs Blood pressure 111/75, pulse 80, temperature 97.9 F (36.6 C), temperature source Oral, resp. rate 15, height 5' 8 (1.727 m), weight 91 kg, SpO2 92%.   Constitutional: No distress . Vital signs reviewed.  Sitting up in wheelchair at therapy gym. HEENT: NCAT, EOMI, oral membranes moist. Neck: supple, no JVD. Cardiovascular: RRR without murmur. No JVD    Respiratory/Chest:  Clear to auscultation bilaterally.  No rales, rhonchi, or wheezing. GI/Abdomen: BS + hypoactive, non-tender, nondistended Ext: no clubbing, cyanosis, or edema Psych: Awake, alert, appropriate.  Jovial.  Skin: C/D/I. No apparent lesions. + Craniotomy-staples removed, well-approximated + PIV c/d/i  MSK:      No apparent deformity.        Neurologic exam:  Cognition: AAO to person, place, time + decreased concentration/awareness, appropriate with conversations but does not carryover well. + tangential   Insight: Fair insight Sensation:  reduced in bilateral lower extremities CN: L eye difficulty converging, otherwise intact--no  change Coordination:  Bilateral upper extremity loss of tone in hands with intention --no longer present on exam Spasticity: MAS 0 in all extremities.          Strength: Unchanged                LUE: 5/5 SA, 5/5 EF, 5/5 EE, 5/5 WE, 5/5 FF, 5/5 FA                RUE:  5/5 SA, 5/5 EF, 5/5 EE, 5/5 WE, 5/5 FF, 5/5 FA                RLE: 3-/5 HF, 3/5 KE, 4-/5  DF, 4-/5 PF                LLE: 3/5 HF, 3/5 KE, 3/5  DF, 3/5 PF improving!   Assessment/Plan: 1. Functional deficits which require 3+ hours per day of interdisciplinary therapy in a comprehensive inpatient rehab setting. Physiatrist is providing close team supervision and 24 hour management of active medical problems listed below. Physiatrist and rehab team continue to assess barriers to discharge/monitor patient progress toward functional and medical goals  Care Tool:  Bathing    Body parts bathed by  patient: Right arm, Left arm, Abdomen, Chest, Front perineal area, Right upper leg, Left upper leg, Right lower leg, Left lower leg, Face   Body parts bathed by helper: Buttocks     Bathing assist Assist Level: Moderate Assistance - Patient 50 - 74%     Upper Body Dressing/Undressing Upper body dressing   What is the patient wearing?: Pull over shirt    Upper body assist Assist Level: Contact Guard/Touching assist    Lower Body Dressing/Undressing Lower body dressing      What is the patient wearing?: Underwear/pull up, Pants     Lower body assist Assist for lower body dressing: Moderate Assistance - Patient 50 - 74%     Toileting Toileting    Toileting assist Assist for toileting: Moderate Assistance - Patient 50 - 74%     Transfers Chair/bed transfer  Transfers assist     Chair/bed transfer assist level: Minimal Assistance - Patient > 75%     Locomotion Ambulation   Ambulation assist      Assist level: Contact Guard/Touching assist Assistive device: Walker-rolling Max distance: 80'   Walk 10 feet  activity   Assist     Assist level: Contact Guard/Touching assist Assistive device: Walker-rolling   Walk 50 feet activity   Assist Walk 50 feet with 2 turns activity did not occur: Safety/medical concerns  Assist level: Contact Guard/Touching assist Assistive device: Walker-rolling    Walk 150 feet activity   Assist Walk 150 feet activity did not occur: Safety/medical concerns         Walk 10 feet on uneven surface  activity   Assist Walk 10 feet on uneven surfaces activity did not occur: Safety/medical concerns         Wheelchair     Assist Is the patient using a wheelchair?: Yes Type of Wheelchair: Manual    Wheelchair assist level: Supervision/Verbal cueing Max wheelchair distance: 150'    Wheelchair 50 feet with 2 turns activity    Assist        Assist Level: Supervision/Verbal cueing   Wheelchair 150 feet activity     Assist      Assist Level: Supervision/Verbal cueing   Blood pressure 111/75, pulse 80, temperature 97.9 F (36.6 C), temperature source Oral, resp. rate 15, height 5' 8 (1.727 m), weight 91 kg, SpO2 92%.  Medical Problem List and Plan: 1. 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 Daughter will be moving in with them. Myoclonus in [place] remains challenging; Min A ADLs overall min-mod for transfers. Amb 15 ft with RW limited by pain. Mild-moderate cognitive deficits variable depending on XRT.   - 10-31: More confused today, hypotensive, leukocytosis of 15 concerning for infection.  Most likely cause is urinary given retention, but will also check chest x-ray given cough.  IV  fluids for AKI.  Notable that patient started radiation therapy for his brain on 10-29  -11/2 [facility] for left foot  - 11/4: Fluctuating participation with myoclonus/jerking; improved this week. Goals are supervision for PT. UBD SPV, Min  A with reacher LB. Jerking and cognition improving.    - Messaged Dr. Dewey with radiation oncology per patient request-PA was kind enough to talk to him yesterday, addressed his current treatments and monitoring moving forward.  Very much appreciate her taking the time to do this!  2.  Antithrombotics: -DVT/anticoagulation:  Pharmaceutical: Lovenox             -antiplatelet therapy: N/A  3. Pain Management/spasms: percocet prn.   - 10-24: Extreme difficulty tolerating therapies due to back and lower hip pain and spasms with mobility.  baclofen to 10 mg 3 times daily, add tizanidine 2 mg 3 times daily, schedule OxyContin 10 mg every 12 hours, continue as needed Percocet 5 mg every 4 hours.  - 10-25: Increased OxyContin to 15 mg twice daily, increase baclofen to 15 mg 3 times daily; immediately notified next therapy session the patient was getting lethargic with these changes, so reverted to prior  - 10-26: Patient pain much improved on current regimen.  Unfortunately, appears to be somewhat sedated and euphoric on OxyContin 10 mg twice daily.  Not using as needed oxycodone, will instead schedule 5 mg 2 times daily before therapies in addition to as needed and DC long-acting.   - 10/27: perhaps a little more pain today but cognitively seemed more appropriate compared to what's recorded- no changes to regimen  10-28: Pain under better control, continues with spasms but seems to be doing better overall.  10-30: Add Elavil 25 mg nightly for peripheral nerve pain at nighttime; already optimized on duloxetine, Lyrica  - 10-31: DC Elavil; did work well for pain, once causes of encephalopathy treated as below May retrial.  Reduce baclofen to 5 mg 3 times daily due to  AKI.  -11/1-2 Reports continued back pain, overall under control with oxycodone--tolerating current regimen, continue  - 11-5: Increase as needed oxycodone from 5 to 5 to 10 mg--well-controlled on current regimen  4. Mood/Behavior/Sleep: LCSW to follow for evaluation and support             --continue ambien prn.              -antipsychotic agents: N/A  - 10-29: Developing some confusion overnight.  Schedule Ambien as he is using it nightly, 5 mg.  Add as needed trazodone 50 mg nightly.  - 10-30: No reports of confusion overnight.  Dors is sleeping well.  - 10-31: Grossly encephalopathic today, workup reveals leukocytosis of 15, AKI 1.69, and positive UA.  Delirium precautions, treatment as below.   -11/2 DC ambien per patient request, bad dreams reported  - 11-4: Sleeping well at night, no current changes warranted.   - 11/6: not using trazodone  5. Neuropsych/cognition: This patient may be intermittently  capable of making decisions on his own behalf. 6. Skin/Wound Care: Routine pressure relief measures.   10/27 dc staples today  7. Fluids/Electrolytes/Nutrition: Monitor I/O.   8.  Hyperglycemia due to steroids for lung cancer w/brain mets: XRT started today and at end of day as able to help with tolerance of therapy             --on  Decadron 4 mg TID and fasting BS 120-150 range.  --Will check A1c in am--5.7, prediabetic  fair control overall, continue to monitor  9. COPD: On Singulair with albuterol prn.  10. HTN: Monitor BP TID---on avapro and cardura.   - Monitor with therapies today  - 10-25: Increase Cardura for urinary retention to 3 mg nightly  -  10-26: Blood pressures trending low; reduce Avapro 300 to 150 mg daily.  May need to reduce Cardura again if BP does not tolerate.  10/27 hold Avapro--BP improved 10-28--monitor on current regimen  10-31: Hypotension likely secondary to SIRS, give 1 L IV fluid bolus today, may need more fluids through the weekend.  Hold Cardura until  BP improves.  - well controlled, continue current    05/30/2024    4:13 PM 05/30/2024    5:17 AM 05/29/2024    8:03 PM  Vitals with BMI  Systolic 111 124 861  Diastolic 75 88 86  Pulse 80 75 84    11. CAD: On Avapro and statin. Monitor for symptoms with increase in activity.              --continue to hold spironolactone and torsemide.    - weights stable Filed Weights   05/15/24 1727 05/16/24 1544 05/17/24 0504  Weight: 91.1 kg 91.1 kg 91 kg    12.  Hyponatremia: Na 132 @ admission-->131.  --Recheck BMET in am--stable 10/27 stable at 132 10-31: 131 today, likely secondary to AKI as below.  11-1 stable 134  13. Acute on chronic renal failure: Encourage fluid intake.  BUN/SCr 27/1.32-->61/1.38 -- Monitor potassium levels as baseline 4.9-5.0-->continue ARB for now.  - Recheck CMET in am--improving BUN 37, creatinine 1.14, potassium 4.5 -10/27 BUN/Cr back up to 62/1.6 today, k+ 5.1 - hold avapro for now, push fluids and recheck in AM 10-28: BUN down to 47, creatinine normalized 1.09.  Continue to encourage p.o. fluids. 10-31: Recurrent AKI today, creatinine 1.69.  NA 131, potassium 5.2.  Give 1 L IV fluids, reduce baclofen as above, repeat BMP tonight, depending on results may give additional 1 L.  Repeat BMP in AM. 11/1 BUN and Cr improved to 52/1.16--> back to baseline, creatinine 0.9 11/6: BUN up to 55 - give I L IVF, push POs, repeat in AM  14.  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  - Baclofen 10 mg 3 times daily  - Tizanidine 2 mg 3 times daily  - 10-25: Start dantrolene 25 mg daily, after 1 week test LFTs and increase to 3 times daily if tolerated.  Add clonazepam 0.5 mg 3 times daily as needed for myoclonus.  May need to add VPA if myoclonus worsens.  10-26/27: Spasms much better controlled, has not needed to use clonazepam yet.  Trend.  10-28: Start low-dose Keppra 500 mg twice daily for upper extremity myoclonus--notable  improvement 10/29!  10/31: More myoclonus noted today, complicated by AKI and likely UTI as below.  Treat underlying causes, if continues to be persistent would recommend EEG over the weekend.  -11/2 Not having any today, continue to monitor. If it reoccurs pt advised to let nursing know, can consider EEG   - 11-3/4: Reports no further myoclonus for the last 2 days.  Continue Keppra, if recurs will get EEG.  15.  Neurogenic bladder/urinary retention.  Likely secondary to cauda equina involvement, flaccid bladder.  - Ongoing requirement of intermittent straight cathing; increase Cardura to 3 mg nightly  - 10-26: Added Urecholine 10 mg 3 times daily--if no response with up titration/intolerable side effects, would transition towards patient learning intermittent straight cathing   -10/27 cathed for 800cc this am.    -urecholine just added, consider increase in AM to 25mg  if still retaining   -encouraged up to [facility] or toilet to empty  - 10-28: Increase frequency of cath to  every 4 hours due to large volumes, increase Urecholine to 25 mg 3 times daily.  If no improvement with these measures, will need to drain patient on straight cathing versus place Foley based on upper extremity coordination and bladder volumes greater than 2.5 L/day.  - 10-29: Straight caths volumes better.  Continue Urecholine through today, renal ultrasound for structural cause assessment.  If abdominal cramping from Urecholine intolerable, will DC and discuss placement of Foley catheter  -10-30: Long discussion with patient, his wife regarding likely flaccid bladder secondary to cauda equina involvement.  Patient not agreeable to learning straight cathing at this time, wants Foley placed on discharge; will continue to discuss this with them  10-31: UTI associated with urinary retention, DC bethanechol.  Reducing Cardura due to hypotension as above.  Called patient's wife to discuss Foley placement given IV fluids and already currently  needing every 4 hour caths to avoid high volumes, went to voicemail, will need her consent before placement.  11/1 foley was started, continue Rocephin  11/2 Continue Rocephin, pansensitive ecoli--will reattempt DC Foley 1 week after placement if still at rehab.  Will need urology consult as outpatient  11-4: Vital stable, switch Rocephin to p.o. Keflex for 3 days.  11-6: Patient agreeable to DC Foley trial starting tomorrow, for 48 hours .  Resume Flomax 0.4 mg today.- -Start DC Foley trial 11-7--if no continent voids within 48 hours, would replace Foley catheter  16.  Constipation/diarrhea.  - 10-29: Liquid bowel movements yesterday after sorbitol.  Move MiraLAX to as needed, reduce Senokot S to nightly.  Monitor today due to possibility of abdominal cramping with Urecholine.  - 10-30: Patient endorsing constipation, will increase Senokot S2 1 tab twice daily and MiraLAX daily  10-31: No bowel movement, will give sorbitol 1 back tonight.  11/2 Sorbitol 30ml ordered--not given   10-31; increase MiraLAX to twice daily  Last bowel movement 11-5, medium  17.  Leukocytosis, likely secondary to urinary tract infection  - Start ceftriaxone 2 g daily; follow cultures  - DC bethanechol as above  - Will discuss with wife Foley placement given already need for frequent caths, IV fluid as above  - Pending results of checks x-ray given cough to look for alternate causes  -11/1 WBC back to 9.6  11-3: WBC normalized.  18. Aspiration pneumonia  -Continue rocephin, SLP  -Afebrile  - 11/3: No concern for bedsde swallow   LOS: 15 days A FACE TO FACE EVALUATION WAS PERFORMED  Roger Black 05/30/2024, 5:11 PM

## 2024-05-30 NOTE — Progress Notes (Signed)
 Occupational Therapy Session Note  Patient Details  Name: Roger Black MRN: [id number] Date of Birth: [birth year]  Today's Date: 05/30/2024 OT Individual Time: 9179-9141 OT Individual Time Calculation (min): 38 min    Short Term Goals: Week 2:  OT Short Term Goal 1 (Week 2): STG= LTG d/t ELOS  Skilled Therapeutic Interventions/Progress Updates:  Pt greeted attempting to sit EOB. Skilled OT session with focus on BADL retraining and functional transfers.   Pain: Pt with generalized pain, but reports . . . Feeling good. OT offering intermediate rest breaks and positioning suggestions throughout session to address pain/fatigue and maximize participation/safety in session.   Functional Transfers: Sit>stand from EOB with Min A, ambulatory toilet transfer with CGA + RW.   Self Care Tasks: Pt completes the following self care tasks with levels of assistance noted below, Patient declines bathing/dressing due to not having a change of clothes. 3/3 toielting tasks with Mod A + unilateral support. Extended time required for bowel void. Encouragement needed for independence with task.   Pt remained sitting in WC, in direct care of PT, 4Ps assessed and immediate needs met. Pt continues to be appropriate for skilled OT intervention to promote further functional independence in ADLs/IADLs.   Therapy Documentation Precautions:  Precautions Precautions: Fall Precaution/Restrictions Comments: BP <150 Restrictions Weight Bearing Restrictions Per Provider Order: No  Therapy/Group: Individual Therapy  Nereida Habermann, OTR/L, MSOT  05/30/2024, 7:00 AM

## 2024-05-30 NOTE — Progress Notes (Signed)
 Physical Therapy Session Note  Patient Details  Name: Roger Black MRN: [id number] Date of Birth: 10/22/1955  Today's Date: 05/30/2024 PT Individual Time: 0900-0945 PT Individual Time Calculation (min): 45 min   Short Term Goals: Week 2:  PT Short Term Goal 1 (Week 2): STGs = LTGs  Skilled Therapeutic Interventions/Progress Updates:  Pt recd in w/c as hand off from OT. Pt completed oral hygiene tasks at w/c level with supervision. Pt propelled w/c with BUE throughout session for endurance and functional mobility. Pt performed Sit to stand and Stand pivot transfer with CGA during session. Session focused on static balance and strength with extended standing. Pt stood to RW and then transitioned to trekking pole in R hand for lower stability UE support while performing stacking activity with cups. Performed several bouts with pt initially able to stand 2-3 min but <60 sec once fatigued with pt noting to have near LOB that he might be able to catch but sitting to mat table instead. Pt returned to room after session and remained in w/c, was left with all needs in reach and alarm active.   Therapy Documentation Precautions:  Precautions Precautions: Fall Precaution/Restrictions Comments: BP <150 Restrictions Weight Bearing Restrictions Per Provider Order: No General:       Therapy/Group: Individual Therapy  [name] 05/30/2024, 12:52 PM

## 2024-05-30 NOTE — Progress Notes (Signed)
 Physical Therapy Weekly Progress Note  Patient Details  Name: Tauren Delbuono MRN: [id number] Date of Birth: 1956-01-31  Beginning of progress report period: May 22, 2024 End of progress report period: May 30, 2024  Today's Date: 05/30/2024 PT Individual Time: 1050-1200 PT Individual Time Calculation (min): 70 min   Patient has met 0 of 0 short term goals. Pt is progressing well toward mobility goals, though did not have any short term goals set due to ELOS being extended. Pt has had more consistent presentation with therapy, and has required CGA to minA overall. Pt ambulates with RW but has occasional buckling of lower extremities, requiring CGA at all times for safety. Pt has had hands on family education and family is prepared for discharge.  Patient continues to demonstrate the following deficits muscle weakness, decreased cardiorespiratoy endurance, decreased coordination, and decreased standing balance, decreased postural control, and decreased balance strategies and therefore will continue to benefit from skilled PT intervention to increase functional independence with mobility.  Patient progressing toward long term goals..  Continue plan of care.  PT Short Term Goals Week 2:  PT Short Term Goal 1 (Week 2): STGs = LTGs Week 3:  PT Short Term Goal 1 (Week 3): STGs = LTGs  Skilled Therapeutic Interventions/Progress Updates:  Ambulation/gait training;Community reintegration;DME/adaptive equipment instruction;Neuromuscular re-education;Psychosocial support;Stair training;UE/LE Strength taining/ROM;Wheelchair propulsion/positioning;Balance/vestibular training;Discharge planning;Functional electrical stimulation;Pain management;Skin care/wound management;Therapeutic Activities;UE/LE Coordination activities;Cognitive remediation/compensation;Disease management/prevention;Functional mobility training;Patient/family education;Splinting/orthotics;Therapeutic Exercise;Visual/perceptual  remediation/compensation   Pt received seated in WC and agrees to therapy. No complaint of pain, but does report numbness in BLEs. Pt self propels WC x100' to gym with BUEs, working on endurance and mobility training. Pt stands with RW and CGA, with cues for initiation. Pt ambulates x175' with RW and CGA overall, with cues to decrease WB through RW for energy conservation and body mechanics. Pt has decreased control of stand to sit when transitioning to [facility], requiring minA for safety. PT educates on importance of flexing at hips and shifting weight posteriorly for stand to sit. Pt takes extended seated rest break.  Pt positioned with back against column and platform placed underneath buttocks. Platform placed on top of 4 bolsters to raise sitting surface. Pt then tasked with attempting squats with back against column. Pt is unable to clear buttocks without only lower extremities, but can complete partial squat with use of BUEs. PT provides cues for body mechanics and correct performance. Pt performs multiple sets of pushing up from platform and then attempting to slowly lower buttocks back down with eccentric control. Pt is unable to hold buttocks up isometrically without upper extremity support. Pt stands from platform with minA and facilitation of powering up, then ambulates x50' back toward room with RW and CGA, but requires modA for sit to supine with both legs buckling and decreased control of stand to sit. WC transport back to room. Left seated with all needs within reach.   Therapy Documentation Precautions:  Precautions Precautions: Fall Precaution/Restrictions Comments: BP <150 Restrictions Weight Bearing Restrictions Per Provider Order: No   Therapy/Group: Individual Therapy  Elsie JAYSON Dawn, PT, DPT 05/30/2024, 4:04 PM

## 2024-05-30 NOTE — Addendum Note (Signed)
 Encounter addended by: Jacques Norlene MATSU, CMA on: 05/30/2024 12:24 PM  Actions taken: Imaging Exam ended

## 2024-05-30 NOTE — Addendum Note (Signed)
 Encounter addended by: Jacques Norlene MATSU, CMA on: 05/30/2024 12:28 PM  Actions taken: Imaging Exam ended

## 2024-05-31 ENCOUNTER — Inpatient Hospital Stay (HOSPITAL_COMMUNITY)

## 2024-05-31 DIAGNOSIS — M7989 Other specified soft tissue disorders: Secondary | ICD-10-CM

## 2024-05-31 MED ORDER — VITAMIN D 25 MCG (1000 UNIT) PO TABS
1000.0000 [IU] | ORAL_TABLET | Freq: Every day | ORAL | Status: DC
  Administered 2024-05-31 – 2024-06-03 (×4): 1000 [IU] via ORAL
  Filled 2024-05-31 (×4): qty 1

## 2024-05-31 MED ORDER — APIXABAN 5 MG PO TABS: 5.0000 mg | ORAL_TABLET | Freq: Two times a day (BID) | ORAL | Status: DC

## 2024-05-31 MED ORDER — APIXABAN 5 MG PO TABS
10.0000 mg | ORAL_TABLET | Freq: Two times a day (BID) | ORAL | Status: DC
  Administered 2024-05-31 – 2024-06-03 (×5): 10 mg via ORAL
  Filled 2024-05-31 (×6): qty 2

## 2024-05-31 NOTE — Plan of Care (Signed)
  Problem: Consults Goal: RH BRAIN INJURY PATIENT EDUCATION Description: Description: See Patient Education module for eduction specifics Outcome: Progressing   Problem: RH BOWEL ELIMINATION Goal: RH STG MANAGE BOWEL WITH ASSISTANCE Description: STG Manage Bowel with supervision Assistance. Outcome: Progressing   Problem: RH BLADDER ELIMINATION Goal: RH STG MANAGE BLADDER WITH ASSISTANCE Description: STG Manage Bladder With supervision Assistance Outcome: Progressing   Problem: RH SKIN INTEGRITY Goal: RH STG SKIN FREE OF INFECTION/BREAKDOWN Description: Manage skin free of infection with supervision assistance Outcome: Progressing   Problem: RH SAFETY Goal: RH STG ADHERE TO SAFETY PRECAUTIONS W/ASSISTANCE/DEVICE Description: STG Adhere to Safety Precautions With supervision Assistance/Device. Outcome: Progressing

## 2024-05-31 NOTE — Progress Notes (Signed)
 VASCULAR LAB    Left lower extremity venous duplex has been performed.  See CV proc for preliminary results.   Kuper Rennels, RVT 05/31/2024, 4:31 PM

## 2024-05-31 NOTE — Progress Notes (Signed)
 PHARMACY - ANTICOAGULATION CONSULT NOTE  Pharmacy Consult for Apixaban Indication: DVT  No Known Allergies  Patient Measurements: Height: 5' 8 (172.7 cm) Weight: 91 kg (200 lb 9.9 oz) IBW/kg (Calculated) : 68.4 HEPARIN DW (KG): 87.2  Vital Signs: Temp: 97.5 F (36.4 C) (11/08 1659) Temp Source: Oral (11/08 1659) BP: 129/97 (11/08 1659) Pulse Rate: 92 (11/08 1659)  Labs: Recent Labs    05/29/24 0446  HGB 12.9*  HCT 38.4*  PLT 78*  CREATININE 1.09    Estimated Creatinine Clearance: 71 mL/min (by C-G formula based on SCr of 1.09 mg/dL).   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 Consult for pharmacy to dose apixaban for DVT. 10 mg BID X 14 doses then 5 mg BID after.  Roger Black Roger Black 05/31/2024,6:15 PM

## 2024-05-31 NOTE — Progress Notes (Addendum)
 PROGRESS NOTE   Subjective/Complaints: C/o LLE swelling: vas US  ordered C/o left sided facial tingling intermittently, vitamin D supplement added  Working on his computer  ROS:  Neg for Chest pain or SOB Positive for urinary retention, and bilateral hip pain/spasms, bilateral foot sensory reduction, +left lower extremity swelling   Objective:   No results found.   Recent Labs    05/29/24 0446  WBC 7.2  HGB 12.9*  HCT 38.4*  PLT 78*   Recent Labs    05/29/24 0446  NA 135  K 4.8  CL 103  CO2 22  GLUCOSE 131*  BUN 55*  CREATININE 1.09  CALCIUM 8.4*    Intake/Output Summary (Last 24 hours) at 05/31/2024 1203 Last data filed at 05/31/2024 0800 Gross per 24 hour  Intake 480 ml  Output 800 ml  Net -320 ml        Physical Exam: Vital Signs Blood pressure 127/84, pulse 88, temperature 98 F (36.7 C), temperature source Oral, resp. rate 16, height 5' 8 (1.727 m), weight 91 kg, SpO2 95%.   Constitutional: No distress . Vital signs reviewed.  Sitting up in wheelchair at therapy gym. HEENT: NCAT, EOMI, oral membranes moist. Neck: supple, no JVD. Cardiovascular: RRR without murmur. No JVD    Respiratory/Chest:  Clear to auscultation bilaterally.  No rales, rhonchi, or wheezing. GI/Abdomen: BS + hypoactive, non-tender, nondistended Ext: no clubbing, cyanosis, +LLE edema Psych: Awake, alert, appropriate.  Jovial.  Skin: C/D/I. No apparent lesions. + Craniotomy-staples removed, well-approximated + PIV c/d/i  MSK:      No apparent deformity.        PRIOR EXAMS: Cognition: AAO to person, place, time + decreased concentration/awareness, appropriate with conversations but does not carryover well. + tangential   Insight: Fair insight Sensation:  reduced in bilateral lower extremities CN: L eye difficulty converging, otherwise intact--no change Coordination:  Bilateral upper extremity loss of tone in hands  with intention --no longer present on exam Spasticity: MAS 0 in all extremities.          Strength: Unchanged                LUE: 5/5 SA, 5/5 EF, 5/5 EE, 5/5 WE, 5/5 FF, 5/5 FA                RUE:  5/5 SA, 5/5 EF, 5/5 EE, 5/5 WE, 5/5 FF, 5/5 FA                RLE: 3-/5 HF, 3/5 KE, 4-/5  DF, 4-/5 PF                LLE: 3/5 HF, 3/5 KE, 3/5  DF, 3/5 PF improving!   Assessment/Plan: 1. Functional deficits which require 3+ hours per day of interdisciplinary therapy in a comprehensive inpatient rehab setting. Physiatrist is providing close team supervision and 24 hour management of active medical problems listed below. Physiatrist and rehab team continue to assess barriers to discharge/monitor patient progress toward functional and medical goals  Care Tool:  Bathing    Body parts bathed by patient: Right arm, Left arm, Abdomen, Chest, Front perineal area, Right upper leg, Left upper leg,  Right lower leg, Left lower leg, Face   Body parts bathed by helper: Buttocks     Bathing assist Assist Level: Moderate Assistance - Patient 50 - 74%     Upper Body Dressing/Undressing Upper body dressing   What is the patient wearing?: Pull over shirt    Upper body assist Assist Level: Contact Guard/Touching assist    Lower Body Dressing/Undressing Lower body dressing      What is the patient wearing?: Underwear/pull up, Pants     Lower body assist Assist for lower body dressing: Moderate Assistance - Patient 50 - 74%     Toileting Toileting    Toileting assist Assist for toileting: Moderate Assistance - Patient 50 - 74%     Transfers Chair/bed transfer  Transfers assist     Chair/bed transfer assist level: Minimal Assistance - Patient > 75%     Locomotion Ambulation   Ambulation assist      Assist level: Contact Guard/Touching assist Assistive device: Walker-rolling Max distance: 80'   Walk 10 feet activity   Assist     Assist level: Contact Guard/Touching  assist Assistive device: Walker-rolling   Walk 50 feet activity   Assist Walk 50 feet with 2 turns activity did not occur: Safety/medical concerns  Assist level: Contact Guard/Touching assist Assistive device: Walker-rolling    Walk 150 feet activity   Assist Walk 150 feet activity did not occur: Safety/medical concerns         Walk 10 feet on uneven surface  activity   Assist Walk 10 feet on uneven surfaces activity did not occur: Safety/medical concerns         Wheelchair     Assist Is the patient using a wheelchair?: Yes Type of Wheelchair: Manual    Wheelchair assist level: Supervision/Verbal cueing Max wheelchair distance: 150'    Wheelchair 50 feet with 2 turns activity    Assist        Assist Level: Supervision/Verbal cueing   Wheelchair 150 feet activity     Assist      Assist Level: Supervision/Verbal cueing   Blood pressure 127/84, pulse 88, temperature 98 F (36.7 C), temperature source Oral, resp. rate 16, height 5' 8 (1.727 m), weight 91 kg, SpO2 95%.  Medical Problem List and Plan: 1. 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 Daughter will be moving in with them. Myoclonus in [place] remains challenging; Min A ADLs overall min-mod for transfers. Amb 15 ft with RW limited by pain. Mild-moderate cognitive deficits variable depending on XRT.   - 10-31: More confused today, hypotensive, leukocytosis of 15 concerning for infection.  Most likely cause is urinary given retention, but will also check chest x-ray given cough.  IV fluids for AKI.  Notable that patient started radiation therapy for  his brain on 10-29  -11/2 [facility] for left foot  - 11/4: Fluctuating participation with myoclonus/jerking; improved this week. Goals are supervision for PT. UBD SPV, Min A with reacher LB. Jerking and cognition improving.    - Messaged Dr. Dewey with  radiation oncology per patient request-PA was kind enough to talk to him yesterday, addressed his current treatments and monitoring moving forward.  Very much appreciate her taking the time to do this!  2.  Antithrombotics: -DVT/anticoagulation:  Pharmaceutical: Lovenox             -antiplatelet therapy: N/A  3. Pain Management/spasms: percocet prn. Discussed [facility] outpatient and he was interested in this for his bilateral lower extremity neuropathy  - 10-24: Extreme difficulty tolerating therapies due to back and lower hip pain and spasms with mobility.  baclofen to 10 mg 3 times daily, add tizanidine 2 mg 3 times daily, schedule OxyContin 10 mg every 12 hours, continue as needed Percocet 5 mg every 4 hours.  - 10-25: Increased OxyContin to 15 mg twice daily, increase baclofen to 15 mg 3 times daily; immediately notified next therapy session the patient was getting lethargic with these changes, so reverted to prior  - 10-26: Patient pain much improved on current regimen.  Unfortunately, appears to be somewhat sedated and euphoric on OxyContin 10 mg twice daily.  Not using as needed oxycodone, will instead schedule 5 mg 2 times daily before therapies in addition to as needed and DC long-acting.   - 10/27: perhaps a little more pain today but cognitively seemed more appropriate compared to what's recorded- no changes to regimen  10-28: Pain under better control, continues with spasms but seems to be doing better overall.  10-30: Add Elavil 25 mg nightly for peripheral nerve pain at nighttime; already optimized on duloxetine, Lyrica  - 10-31: DC Elavil; did work well for pain, once causes of encephalopathy treated as below May retrial.  Reduce baclofen to  5 mg 3 times daily due to AKI.  -11/1-2 Reports continued back pain, overall under control with oxycodone--tolerating current regimen, continue  - 11-5: Increase as needed oxycodone from 5 to 5 to 10 mg--well-controlled on current regimen  4. Mood/Behavior/Sleep: LCSW to follow for evaluation and support             --continue ambien prn.              -antipsychotic agents: N/A  - 10-29: Developing some confusion overnight.  Schedule Ambien as he is using it nightly, 5 mg.  Add as needed trazodone 50 mg nightly.  - 10-30: No reports of confusion overnight.  Dors is sleeping well.  - 10-31: Grossly encephalopathic today, workup reveals leukocytosis of 15, AKI 1.69, and positive UA.  Delirium precautions, treatment as below.   -11/2 DC ambien per patient request, bad dreams reported  - 11-4: Sleeping well at night, no current changes warranted.   - 11/6: not using trazodone  5. Neuropsych/cognition: This patient may be intermittently  capable of making decisions on his own behalf. 6. Skin/Wound Care: Routine pressure relief measures.   10/27 dc staples today  7. Fluids/Electrolytes/Nutrition: Monitor I/O.   8.  Hyperglycemia due to steroids for lung cancer w/brain mets: XRT started today and at end of day as able to help with tolerance of therapy             --on  Decadron 4 mg TID and fasting BS 120-150 range.  --Will check A1c in am--5.7, prediabetic  fair control overall, continue to monitor  9. COPD: On Singulair with albuterol prn.  10. HTN: Monitor BP TID---on avapro and cardura.   - Monitor with therapies today  - 10-25: Increase Cardura for urinary retention to 3 mg nightly  - 10-26:  Blood pressures trending low; reduce Avapro 300 to 150 mg daily.  May need to reduce Cardura again if BP does not tolerate.  10/27 hold Avapro--BP improved 10-28--monitor on current regimen  10-31: Hypotension likely secondary to SIRS, give 1 L IV fluid bolus today, may need more fluids through the  weekend.  Hold Cardura until BP improves.  - well controlled, continue current    05/30/2024    8:30 PM 05/30/2024    4:13 PM 05/30/2024    5:17 AM  Vitals with BMI  Systolic 127 111 875  Diastolic 84 75 88  Pulse 88 80 75    11. CAD: On Avapro and statin. Monitor for symptoms with increase in activity.              --continue to hold spironolactone and torsemide.    - weights stable Filed Weights   05/15/24 1727 05/16/24 1544 05/17/24 0504  Weight: 91.1 kg 91.1 kg 91 kg    12.  Hyponatremia: Na 132 @ admission-->131.  --Recheck BMET in am--stable 10/27 stable at 132 10-31: 131 today, likely secondary to AKI as below.  11-1 stable 134  13. Acute on chronic renal failure: Encourage fluid intake.  BUN/SCr 27/1.32-->61/1.38 -- Monitor potassium levels as baseline 4.9-5.0-->continue ARB for now.  - Recheck CMET in am--improving BUN 37, creatinine 1.14, potassium 4.5 -10/27 BUN/Cr back up to 62/1.6 today, k+ 5.1 - hold avapro for now, push fluids and recheck in AM 10-28: BUN down to 47, creatinine normalized 1.09.  Continue to encourage p.o. fluids. 10-31: Recurrent AKI today, creatinine 1.69.  NA 131, potassium 5.2.  Give 1 L IV fluids, reduce baclofen as above, repeat BMP tonight, depending on results may give additional 1 L.  Repeat BMP in AM. 11/1 BUN and Cr improved to 52/1.16--> back to baseline, creatinine 0.9 11/6: BUN up to 55 - give I L IVF, push POs, repeat in AM  14.  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  - Baclofen 10 mg 3 times daily  - Tizanidine 2 mg 3 times daily  - 10-25: Start dantrolene 25 mg daily, after 1 week test LFTs and increase to 3 times daily if tolerated.  Add clonazepam 0.5 mg 3 times daily as needed for myoclonus.  May need to add VPA if myoclonus worsens.  10-26/27: Spasms much better controlled, has not needed to use clonazepam yet.  Trend.  10-28: Start low-dose Keppra 500 mg twice daily for upper extremity  myoclonus--notable improvement 10/29!  10/31: More myoclonus noted today, complicated by AKI and likely UTI as below.  Treat underlying causes, if continues to be persistent would recommend EEG over the weekend.  -11/2 Not having any today, continue to monitor. If it reoccurs pt advised to let nursing know, can consider EEG   - 11-3/4: Reports no further myoclonus for the last 2 days.  Continue Keppra, if recurs will get EEG.  15.  Neurogenic bladder/urinary retention.  Likely secondary to cauda equina involvement, flaccid bladder.  - Ongoing requirement of intermittent straight cathing; increase Cardura to 3 mg nightly  - 10-26: Added Urecholine 10 mg 3 times daily--if no response with up titration/intolerable side effects, would transition towards patient learning intermittent straight cathing   -10/27 cathed for 800cc this am.    -urecholine just added, consider increase in AM to 25mg  if still retaining   -encouraged up to [facility] or toilet to empty  - 10-28: Increase frequency of cath to every  4 hours due to large volumes, increase Urecholine to 25 mg 3 times daily.  If no improvement with these measures, will need to drain patient on straight cathing versus place Foley based on upper extremity coordination and bladder volumes greater than 2.5 L/day.  - 10-29: Straight caths volumes better.  Continue Urecholine through today, renal ultrasound for structural cause assessment.  If abdominal cramping from Urecholine intolerable, will DC and discuss placement of Foley catheter  -10-30: Long discussion with patient, his wife regarding likely flaccid bladder secondary to cauda equina involvement.  Patient not agreeable to learning straight cathing at this time, wants Foley placed on discharge; will continue to discuss this with them  10-31: UTI associated with urinary retention, DC bethanechol.  Reducing Cardura due to hypotension as above.  Called patient's wife to discuss Foley placement given IV fluids  and already currently needing every 4 hour caths to avoid high volumes, went to voicemail, will need her consent before placement.  11/1 foley was started, continue Rocephin  11/2 Continue Rocephin, pansensitive ecoli--will reattempt DC Foley 1 week after placement if still at rehab.  Will need urology consult as outpatient  11-4: Vital stable, switch Rocephin to p.o. Keflex for 3 days.  11-6: Patient agreeable to DC Foley trial starting tomorrow, for 48 hours .  Resume Flomax 0.4 mg today.- -Start DC Foley trial 11-7--if no continent voids within 48 hours, would replace Foley catheter  16.  Constipation/diarrhea.  - 10-29: Liquid bowel movements yesterday after sorbitol.  Move MiraLAX to as needed, reduce Senokot S to nightly.  Monitor today due to possibility of abdominal cramping with Urecholine.  - 10-30: Patient endorsing constipation, will increase Senokot S2 1 tab twice daily and MiraLAX daily  10-31: No bowel movement, will give sorbitol 1 back tonight.  11/2 Sorbitol 30ml ordered--not given  continue MiraLAX twice daily  Last bowel movement 11/7  17.  Leukocytosis, likely secondary to urinary tract infection  - Start ceftriaxone 2 g daily; follow cultures  - DC bethanechol as above  - Will discuss with wife Foley placement given already need for frequent caths, IV fluid as above  - Pending results of checks x-ray given cough to look for alternate causes  -11/1 WBC back to 9.6  11-3: WBC normalized.  18. Aspiration pneumonia  continue rocephin, SLP  -Afebrile  - 11/3: No concern for bedsde swallow   19. LLE swelling: vas US  ordered  20. Left sided facial tingling: discussed this is intermittent in nature, not greatly bothersome to him, discussed possible cranial nerve 5 irritation, vitamin D supplemented added as can help LOS: 16 days A FACE TO FACE EVALUATION WAS PERFORMED  Cleophus Mendonsa P Leveda Kendrix 05/31/2024, 12:03 PM

## 2024-05-31 NOTE — Progress Notes (Signed)
 Physical Therapy Session Note  Patient Details  Name: Roger Black MRN: [id number] Date of Birth: [birth year]  Today's Date: 05/31/2024 PT Individual Time: 0900-0943 PT Individual Time Calculation (min): 43 min   Short Term Goals: Week 1:  PT Short Term Goal 1 (Week 1): Pt will complete bed mobility with minA consistently. PT Short Term Goal 1 - Progress (Week 1): Met PT Short Term Goal 2 (Week 1): Pt will complete sit to stand with minA consistently. PT Short Term Goal 2 - Progress (Week 1): Progressing toward goal PT Short Term Goal 3 (Week 1): Pt will complete bed to chair transfer with minA consistently. PT Short Term Goal 3 - Progress (Week 1): Progressing toward goal PT Short Term Goal 4 (Week 1): Pt will ambulate x100' with minA and LRAD. PT Short Term Goal 4 - Progress (Week 1): Met  Skilled Therapeutic Interventions/Progress Updates:  Pt was seen bedside in the am. Pt willing to participate with therapy. Pt able to don pants with S. Pt transferred supine to edge of bed with contact guard. Pt tolerated edge of bed with stand by assist. Pt transferred sit to stand with rolling walker and contact guard. Pt transferred to w/c with contact guard and rolling walker. Pt transported to rehab. Pt performed multiple transfers with rolling walker and contact guard. Pt ambulated 100 and 120 feet with rolling waller and contact guard. Pt returned to room and left sitting up in w/c with chair alarm on and all needs within reach.   Therapy Documentation Precautions:  Precautions Precautions: Fall Precaution/Restrictions Comments: BP <150 Restrictions Weight Bearing Restrictions Per Provider Order: No General:   Pain: Pain Assessment Pain Scale: 0-10 Pain Score: 10-Worst pain ever Pain Location: Generalized Pain Intervention(s): Medication (See eMAR)  Therapy/Group: Individual Therapy  Vrinda Heckstall G 05/31/2024, 4:02 PM

## 2024-05-31 NOTE — Progress Notes (Signed)
 Notified Dr. Lorilee of patient's vascular results. New orders placed.   Geni Armor, LPN.

## 2024-06-01 NOTE — Progress Notes (Signed)
 This patient sustained a skin tear to their left thumb during a therapy session when lowering their hand next to the wheelchair, as reported by both the therapist and the patient. Dr. Lorilee was promptly notified, and new orders were issued.

## 2024-06-01 NOTE — Progress Notes (Signed)
 Physical Therapy Session Note  Patient Details  Name: Roger Black MRN: [id number] Date of Birth: 22-Nov-1955  Today's Date: 06/01/2024 PT Individual Time: 0915-0958 PT Individual Time Calculation (min): 43 min   Short Term Goals: Week 1:  PT Short Term Goal 1 (Week 1): Pt will complete bed mobility with minA consistently. PT Short Term Goal 1 - Progress (Week 1): Met PT Short Term Goal 2 (Week 1): Pt will complete sit to stand with minA consistently. PT Short Term Goal 2 - Progress (Week 1): Progressing toward goal PT Short Term Goal 3 (Week 1): Pt will complete bed to chair transfer with minA consistently. PT Short Term Goal 3 - Progress (Week 1): Progressing toward goal PT Short Term Goal 4 (Week 1): Pt will ambulate x100' with minA and LRAD. PT Short Term Goal 4 - Progress (Week 1): Met  Skilled Therapeutic Interventions/Progress Updates:  Pt was seen bedside in the am. Pt transferred supine to edge of bed with side rail and S. Pt transferred edge of bed to w/c with contact guard and rolling walker. Pt able to brush teeth and clean dentures at w/c level independently. Pt propelled w/c to and from gym with B UEs and S. Pt transferred to and from Nu-step with contact guard and rolling walker. Pt rode Nu-step x 10 minutes at level 1, completed 400 steps. Pt returned to room and left sitting up in w/c at sink to shave.   Therapy Documentation Precautions:  Precautions Precautions: Fall Precaution/Restrictions Comments: BP <150 Restrictions Weight Bearing Restrictions Per Provider Order: No General:   Pain: No c/o pain.   Therapy/Group: Individual Therapy  Merilee Lynwood MATSU 06/01/2024, 12:19 PM

## 2024-06-01 NOTE — Plan of Care (Signed)
  Problem: Consults Goal: RH BRAIN INJURY PATIENT EDUCATION Description: Description: See Patient Education module for eduction specifics Outcome: Progressing   Problem: RH BOWEL ELIMINATION Goal: RH STG MANAGE BOWEL WITH ASSISTANCE Description: STG Manage Bowel with supervision Assistance. Outcome: Progressing   Problem: RH BLADDER ELIMINATION Goal: RH STG MANAGE BLADDER WITH ASSISTANCE Description: STG Manage Bladder With supervision Assistance Outcome: Progressing   Problem: RH SKIN INTEGRITY Goal: RH STG SKIN FREE OF INFECTION/BREAKDOWN Description: Manage skin free of infection with supervision assistance Outcome: Progressing   Problem: RH SAFETY Goal: RH STG ADHERE TO SAFETY PRECAUTIONS W/ASSISTANCE/DEVICE Description: STG Adhere to Safety Precautions With supervision Assistance/Device. Outcome: Progressing

## 2024-06-01 NOTE — Progress Notes (Signed)
 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  ROS:  Denies Chest pain or SOB Positive for urinary retention, and bilateral hip pain/spasms, bilateral foot sensory reduction, +left lower extremity swelling   Objective:   VAS US  LOWER EXTREMITY VENOUS (DVT) Result Date: 05/31/2024  Lower Venous DVT Study Patient Name:  JAYQUON THEILER [facility]  Date of Exam:   05/31/2024 Medical Rec #: [id number]             Accession #:    [id number] Date of Birth: Jan 23, 1956             Patient Gender: M Patient Age:   68 years Exam Location:  [facility] Procedure:      VAS US  LOWER EXTREMITY VENOUS (DVT) Referring Phys: SVEN ELKS --------------------------------------------------------------------------------  Indications: Swelling.  Comparison Study: Prior negative bilateral LEV done 02/13/24 Performing Technologist: Alberta Lis RVS  Examination Guidelines: A complete evaluation includes B-mode imaging, spectral Doppler, color Doppler, and power Doppler as needed of all accessible portions of each vessel. Bilateral testing is considered an integral part of a complete examination. Limited examinations for reoccurring indications may be performed as noted. The reflux portion of the exam is performed with the patient in reverse Trendelenburg.  +-----+---------------+---------+-----------+----------+--------------+ RIGHTCompressibilityPhasicitySpontaneityPropertiesThrombus Aging +-----+---------------+---------+-----------+----------+--------------+ CFV  Full           Yes      Yes                                 +-----+---------------+---------+-----------+----------+--------------+ SFJ  Full                                                         +-----+---------------+---------+-----------+----------+--------------+   +---------+---------------+---------+-----------+----------+-----------------+ LEFT     CompressibilityPhasicitySpontaneityPropertiesThrombus Aging    +---------+---------------+---------+-----------+----------+-----------------+ CFV      Full           Yes      Yes                                    +---------+---------------+---------+-----------+----------+-----------------+ SFJ      Full                                                           +---------+---------------+---------+-----------+----------+-----------------+ FV Prox  Full           Yes      No                                     +---------+---------------+---------+-----------+----------+-----------------+  FV Mid   Partial        Yes      No                   Age Indeterminate +---------+---------------+---------+-----------+----------+-----------------+ FV DistalPartial                                      Age Indeterminate +---------+---------------+---------+-----------+----------+-----------------+ PFV      Full           Yes      No                                     +---------+---------------+---------+-----------+----------+-----------------+ POP      Full           Yes      Yes                                    +---------+---------------+---------+-----------+----------+-----------------+ PTV      Partial                                      Age Indeterminate +---------+---------------+---------+-----------+----------+-----------------+ PERO     Partial                                      Age Indeterminate +---------+---------------+---------+-----------+----------+-----------------+ Gastroc  Full                                                           +---------+---------------+---------+-----------+----------+-----------------+    Summary: RIGHT: - No evidence of common femoral vein  obstruction.   LEFT: - Findings consistent with age indeterminate deep vein thrombosis involving the left femoral vein, left posterior tibial veins, and left peroneal veins.  - No cystic structure found in the popliteal fossa.  *See table(s) above for measurements and observations.    Preliminary      No results for input(s): WBC, HGB, HCT, PLT in the last 72 hours.  No results for input(s): NA, K, CL, CO2, GLUCOSE, BUN, CREATININE, CALCIUM in the last 72 hours.   Intake/Output Summary (Last 24 hours) at 06/01/2024 1254 Last data filed at 06/01/2024 0700 Gross per 24 hour  Intake 476 ml  Output --  Net 476 ml        Physical Exam: Vital Signs Blood pressure 117/76, pulse 84, temperature 97.8 F (36.6 C), temperature source Oral, resp. rate 18, height 5' 8 (1.727 m), weight 91 kg, SpO2 97%.   Constitutional: No distress . Vital signs reviewed.  Sitting up in wheelchair at therapy gym. HEENT: NCAT, EOMI, oral membranes moist. Neck: supple, no JVD. Cardiovascular: RRR without murmur. No JVD    Respiratory/Chest:  Clear to auscultation bilaterally.  No rales, rhonchi, or wheezing. GI/Abdomen: BS + hypoactive, non-tender, nondistended Ext: no clubbing, cyanosis, +LLE edema Psych: Awake, alert, appropriate.  Jovial.  Skin: C/D/I. No apparent lesions. + Craniotomy-staples removed, well-approximated +  PIV c/d/i  MSK:      No apparent deformity.        PRIOR EXAMS: Cognition: AAO to person, place, time + decreased concentration/awareness, appropriate with conversations but does not carryover well. + tangential   Insight: Fair insight Sensation:  reduced in bilateral lower extremities CN: L eye difficulty converging, otherwise intact--no change Coordination:  Bilateral upper extremity loss of tone in hands with intention --no longer present on exam Spasticity: MAS 0 in all extremities.          Strength: Unchanged                LUE: 5/5 SA, 5/5 EF,  5/5 EE, 5/5 WE, 5/5 FF, 5/5 FA                RUE:  5/5 SA, 5/5 EF, 5/5 EE, 5/5 WE, 5/5 FF, 5/5 FA                RLE: 3-/5 HF, 3/5 KE, 4-/5  DF, 4-/5 PF                LLE: 3/5 HF, 3/5 KE, 3/5  DF, 3/5 PF improving! Stable 11/9   Assessment/Plan: 1. Functional deficits which require 3+ hours per day of interdisciplinary therapy in a comprehensive inpatient rehab setting. Physiatrist is providing close team supervision and 24 hour management of active medical problems listed below. Physiatrist and rehab team continue to assess barriers to discharge/monitor patient progress toward functional and medical goals  Care Tool:  Bathing    Body parts bathed by patient: Right arm, Left arm, Abdomen, Chest, Front perineal area, Right upper leg, Left upper leg, Right lower leg, Left lower leg, Face   Body parts bathed by helper: Buttocks     Bathing assist Assist Level: Moderate Assistance - Patient 50 - 74%     Upper Body Dressing/Undressing Upper body dressing   What is the patient wearing?: Pull over shirt    Upper body assist Assist Level: Contact Guard/Touching assist    Lower Body Dressing/Undressing Lower body dressing      What is the patient wearing?: Underwear/pull up, Pants     Lower body assist Assist for lower body dressing: Moderate Assistance - Patient 50 - 74%     Toileting Toileting    Toileting assist Assist for toileting: Moderate Assistance - Patient 50 - 74%     Transfers Chair/bed transfer  Transfers assist     Chair/bed transfer assist level: Contact Guard/Touching assist     Locomotion Ambulation   Ambulation assist      Assist level: Contact Guard/Touching assist Assistive device: Walker-rolling Max distance: 170   Walk 10 feet activity   Assist     Assist level: Contact Guard/Touching assist Assistive device: Walker-rolling   Walk 50 feet activity   Assist Walk 50 feet with 2 turns activity did not occur: Safety/medical  concerns  Assist level: Contact Guard/Touching assist Assistive device: Walker-rolling    Walk 150 feet activity   Assist Walk 150 feet activity did not occur: Safety/medical concerns  Assist level: Contact Guard/Touching assist Assistive device: Walker-rolling    Walk 10 feet on uneven surface  activity   Assist Walk 10 feet on uneven surfaces activity did not occur: Safety/medical concerns         Wheelchair     Assist Is the patient using a wheelchair?: Yes Type of Wheelchair: Manual    Wheelchair assist level: Supervision/Verbal cueing Max wheelchair distance: 150  Wheelchair 50 feet with 2 turns activity    Assist        Assist Level: Supervision/Verbal cueing   Wheelchair 150 feet activity     Assist      Assist Level: Supervision/Verbal cueing   Blood pressure 117/76, pulse 84, temperature 97.8 F (36.6 C), temperature source Oral, resp. rate 18, height 5' 8 (1.727 m), weight 91 kg, SpO2 97%.  Medical Problem List and Plan: 1. 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 Daughter will be moving in with them. Myoclonus in U[place] remains challenging; Min A ADLs overall min-mod for transfers. Amb 15 ft with RW limited by pain. Mild-moderate cognitive deficits variable depending on XRT.   - 10-31: More confused today, hypotensive, leukocytosis of 15 concerning for infection.  Most likely cause is urinary given retention, but will also check chest x-ray given cough.  IV fluids for AKI.  Notable that patient started radiation therapy for his brain on 10-29  -11/2 [facility] for left foot  - 11/4: Fluctuating  participation with myoclonus/jerking; improved this week. Goals are supervision for PT. UBD SPV, Min A with reacher LB. Jerking and cognition improving.    - Messaged Dr. Dewey with radiation oncology per patient request-PA was kind enough to talk to him yesterday, addressed his current treatments and monitoring moving forward.  Very much appreciate her taking the time to do this!  Discussed lifestyle strategies for cancer treatment  2.  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 Discussed risks and benefits of treatment  3. Pain Management/spasms: percocet prn. Discussed [facility] outpatient and he was interested in this for his bilateral lower extremity neuropathy  - 10-24: Extreme difficulty tolerating therapies due to back and lower hip pain and spasms with mobility.  baclofen to 10 mg 3 times daily, add tizanidine 2 mg 3 times daily, schedule OxyContin 10 mg every 12 hours, continue as needed Percocet 5 mg every 4 hours.  - 10-25: Increased OxyContin to 15 mg twice daily, increase baclofen to 15 mg 3 times daily; immediately notified next therapy session the patient was getting lethargic with these changes, so reverted to prior  - 10-26: Patient pain much improved on current regimen.  Unfortunately, appears to be somewhat sedated and euphoric on OxyContin 10 mg twice daily.  Not using as needed oxycodone, will instead schedule 5 mg 2 times daily before therapies in addition to as needed and DC long-acting.   - 10/27: perhaps a little more pain today but cognitively seemed more appropriate compared to what's recorded- no changes to regimen  10-28: Pain under better control, continues with spasms but seems to be doing better overall.  10-30: Add Elavil 25 mg nightly for peripheral nerve pain at nighttime; already optimized on duloxetine, Lyrica  - 10-31: DC Elavil; did work well for pain, once causes of encephalopathy treated as below May retrial.  Reduce  baclofen to 5 mg 3 times daily due to AKI.  -11/1-2 Reports continued back pain, overall under control with oxycodone--tolerating current  regimen, continue  - 11-5: Increase as needed oxycodone from 5 to 5 to 10 mg--well-controlled on current regimen  4. Mood/Behavior/Sleep: LCSW to follow for evaluation and support             --continue ambien prn.              -antipsychotic agents: N/A  - 10-29: Developing some confusion overnight.  Schedule Ambien as he is using it nightly, 5 mg.  Add as needed trazodone 50 mg nightly.  - 10-30: No reports of confusion overnight.  Dors is sleeping well.  - 10-31: Grossly encephalopathic today, workup reveals leukocytosis of 15, AKI 1.69, and positive UA.  Delirium precautions, treatment as below.   -11/2 DC ambien per patient request, bad dreams reported  - 11-4: Sleeping well at night, no current changes warranted.   - 11/6: not using trazodone  5. Neuropsych/cognition: This patient may be intermittently  capable of making decisions on his own behalf. 6. New skin tear to left thumb: monitor daily  7. Fluids/Electrolytes/Nutrition: Monitor I/O.   8.  Hyperglycemia due to steroids for lung cancer w/brain mets: XRT started today and at end of day as able to help with tolerance of therapy             --on  Decadron 4 mg TID and fasting BS 120-150 range.  --Will check A1c in am--5.7, prediabetic  fair control overall, continue to monitor  9. COPD: On Singulair with albuterol prn.  10. HTN: Monitor BP TID---on avapro and cardura.   - Monitor with therapies today  - 10-25: Increase Cardura for urinary retention to 3 mg nightly  - 10-26: Blood pressures trending low; reduce Avapro 300 to 150 mg daily.  May need to reduce Cardura again if BP does not tolerate.  10/27 hold Avapro--BP improved 10-28--monitor on current regimen  10-31: Hypotension likely secondary to SIRS, give 1 L IV fluid bolus today, may need more fluids through the weekend.  Hold Cardura  until BP improves.  - well controlled, continue current    06/01/2024    5:01 AM 05/31/2024    8:20 PM 05/31/2024    4:59 PM  Vitals with BMI  Systolic 117 125 870  Diastolic 76 80 97  Pulse 84 87 92    11. CAD: continue Avapro and statin. Monitor for symptoms with increase in activity.              --continue to hold spironolactone and torsemide.    - weights stable Filed Weights   05/15/24 1727 05/16/24 1544 05/17/24 0504  Weight: 91.1 kg 91.1 kg 91 kg    12.  Hyponatremia: Na 132 @ admission-->131.  --Recheck BMET in am--stable 10/27 stable at 132 10-31: 131 today, likely secondary to AKI as below.  11-1 stable 134  13. Acute on chronic renal failure: Encourage fluid intake.  BUN/SCr 27/1.32-->61/1.38 -- Monitor potassium levels as baseline 4.9-5.0-->continue ARB for now.  - Recheck CMET in am--improving BUN 37, creatinine 1.14, potassium 4.5 -10/27 BUN/Cr back up to 62/1.6 today, k+ 5.1 - hold avapro for now, push fluids and recheck in AM 10-28: BUN down to 47, creatinine normalized 1.09.  Continue to encourage p.o. fluids. 10-31: Recurrent AKI today, creatinine 1.69.  NA 131, potassium 5.2.  Give 1 L IV fluids, reduce baclofen as above, repeat BMP tonight, depending on results may give additional 1 L.  Repeat BMP in AM. 11/1 BUN and Cr improved to 52/1.16--> back to baseline, creatinine  0.9 11/6: BUN up to 55 - give I L IVF, push POs, repeat in AM  14.  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  - Baclofen 10 mg 3 times daily  - Tizanidine 2 mg 3 times daily  - 10-25: Start dantrolene 25 mg daily, after 1 week test LFTs and increase to 3 times daily if tolerated.  Add clonazepam 0.5 mg 3 times daily as needed for myoclonus.  May need to add VPA if myoclonus worsens.  10-26/27: Spasms much better controlled, has not needed to use clonazepam yet.  Trend.  10-28: Start low-dose Keppra 500 mg twice daily for upper extremity  myoclonus--notable improvement 10/29!  10/31: More myoclonus noted today, complicated by AKI and likely UTI as below.  Treat underlying causes, if continues to be persistent would recommend EEG over the weekend.  -11/2 Not having any today, continue to monitor. If it reoccurs pt advised to let nursing know, can consider EEG   - 11-3/4: Reports no further myoclonus for the last 2 days.  Continue Keppra, if recurs will get EEG.  15.  Neurogenic bladder/urinary retention.  Likely secondary to cauda equina involvement, flaccid bladder.  - Ongoing requirement of intermittent straight cathing; increase Cardura to 3 mg nightly  - 10-26: Added Urecholine 10 mg 3 times daily--if no response with up titration/intolerable side effects, would transition towards patient learning intermittent straight cathing   -10/27 cathed for 800cc this am.    -urecholine just added, consider increase in AM to 25mg  if still retaining   -encouraged up to [facility] or toilet to empty  - 10-28: Increase frequency of cath to every 4 hours due to large volumes, increase Urecholine to 25 mg 3 times daily.  If no improvement with these measures, will need to drain patient on straight cathing versus place Foley based on upper extremity coordination and bladder volumes greater than 2.5 L/day.  - 10-29: Straight caths volumes better.  Continue Urecholine through today, renal ultrasound for structural cause assessment.  If abdominal cramping from Urecholine intolerable, will DC and discuss placement of Foley catheter  -10-30: Long discussion with patient, his wife regarding likely flaccid bladder secondary to cauda equina involvement.  Patient not agreeable to learning straight cathing at this time, wants Foley placed on discharge; will continue to discuss this with them  10-31: UTI associated with urinary retention, DC bethanechol.  Reducing Cardura due to hypotension as above.  Called patient's wife to discuss Foley placement given IV fluids  and already currently needing every 4 hour caths to avoid high volumes, went to voicemail, will need her consent before placement.  11/1 foley was started, continue Rocephin  11/2 Continue Rocephin, pansensitive ecoli--will reattempt DC Foley 1 week after placement if still at rehab.  Will need urology consult as outpatient  11-4: Vital stable, switch Rocephin to p.o. Keflex for 3 days.  11-6: Patient agreeable to DC Foley trial starting tomorrow, for 48 hours .  Resume Flomax 0.4 mg today.- -Start DC Foley trial 11-7--if no continent voids within 48 hours, would replace Foley catheter  16.  Constipation/diarrhea.  - 10-29: Liquid bowel movements yesterday after sorbitol.  Move MiraLAX to as needed, reduce Senokot S to nightly.  Monitor today due to possibility of abdominal cramping with Urecholine.  - 10-30: Patient endorsing constipation, will increase Senokot S2 1 tab twice daily and MiraLAX daily  10-31: No bowel movement, will give sorbitol 1 back tonight.  11/2 Sorbitol 30ml ordered--not given  continue MiraLAX twice daily  Last bowel movement 11/7  17.  Leukocytosis, likely secondary to urinary tract infection  - Start ceftriaxone 2 g daily; follow cultures  - DC bethanechol as above  - Will discuss with wife Foley placement given already need for frequent caths, IV fluid as above  - Pending results of checks x-ray given cough to look for alternate causes  -11/1 WBC back to 9.6  11-3: WBC normalized.  18. Aspiration pneumonia  continue rocephin, SLP  -Afebrile  - 11/3: No concern for bedsde swallow   19. LLE swelling: vas US  ordered, see #2  20. Left sided facial tingling: discussed this is intermittent in nature, not greatly bothersome to him, discussed possible cranial nerve 5 irritation, vitamin D supplemented added as can help LOS: 17 days A FACE TO FACE EVALUATION WAS PERFORMED  Sven P Asberry Lascola 06/01/2024, 12:54 PM

## 2024-06-01 NOTE — Progress Notes (Signed)
 Physical Therapy Session Note  Patient Details  Name: Roger Black MRN: [id number] Date of Birth: 1955/09/13  Today's Date: 06/01/2024 PT Individual Time: 1112-1157 PT Individual Time Calculation (min): 45 min   Short Term Goals: Week 1:  PT Short Term Goal 1 (Week 1): Pt will complete bed mobility with minA consistently. PT Short Term Goal 1 - Progress (Week 1): Met PT Short Term Goal 2 (Week 1): Pt will complete sit to stand with minA consistently. PT Short Term Goal 2 - Progress (Week 1): Progressing toward goal PT Short Term Goal 3 (Week 1): Pt will complete bed to chair transfer with minA consistently. PT Short Term Goal 3 - Progress (Week 1): Progressing toward goal PT Short Term Goal 4 (Week 1): Pt will ambulate x100' with minA and LRAD. PT Short Term Goal 4 - Progress (Week 1): Met  Skilled Therapeutic Interventions/Progress Updates:  Pt was seen bedside in the am. Pt propelled w/c to gym with B UEs and S. Pt performed multiple sit to stand transfers with rolling walker and S to contact guard. Pt ambulated 170 feet with rolling walker and contact guard. Pt performed step taps and alternating step taps, 3 sets x 5 reps each for LE strengthening. Pt returned to room. Pt transferred to commode with rolling walker and contact guard. Pt left sitting commode with call bell within reach.   Therapy Documentation Precautions:  Precautions Precautions: Fall Precaution/Restrictions Comments: BP <150 Restrictions Weight Bearing Restrictions Per Provider Order: No General:   Pain: No c/o pain.   Therapy/Group: Individual Therapy  Merilee Lynwood MATSU 06/01/2024, 12:24 PM

## 2024-06-02 ENCOUNTER — Other Ambulatory Visit (HOSPITAL_COMMUNITY): Payer: Self-pay

## 2024-06-02 ENCOUNTER — Telehealth (HOSPITAL_COMMUNITY): Payer: Self-pay

## 2024-06-02 ENCOUNTER — Ambulatory Visit: Admitting: Radiation Oncology

## 2024-06-02 LAB — CBC
HCT: 37.7 % — ABNORMAL LOW (ref 39.0–52.0)
Hemoglobin: 12.8 g/dL — ABNORMAL LOW (ref 13.0–17.0)
MCH: 32.6 pg (ref 26.0–34.0)
MCHC: 34 g/dL (ref 30.0–36.0)
MCV: 95.9 fL (ref 80.0–100.0)
Platelets: 66 K/uL — ABNORMAL LOW (ref 150–400)
RBC: 3.93 MIL/uL — ABNORMAL LOW (ref 4.22–5.81)
RDW: 13.2 % (ref 11.5–15.5)
WBC: 4.9 K/uL (ref 4.0–10.5)
nRBC: 0 % (ref 0.0–0.2)

## 2024-06-02 LAB — BASIC METABOLIC PANEL WITH GFR
Anion gap: 11 (ref 5–15)
BUN: 30 mg/dL — ABNORMAL HIGH (ref 8–23)
CO2: 23 mmol/L (ref 22–32)
Calcium: 8.7 mg/dL — ABNORMAL LOW (ref 8.9–10.3)
Chloride: 101 mmol/L (ref 98–111)
Creatinine, Ser: 0.97 mg/dL (ref 0.61–1.24)
GFR, Estimated: >60 mL/min (ref >60)
Glucose, Bld: 112 mg/dL — ABNORMAL HIGH (ref 70–99)
Potassium: 5.3 mmol/L — ABNORMAL HIGH (ref 3.5–5.1)
Sodium: 135 mmol/L (ref 135–145)

## 2024-06-02 MED ORDER — OXYCODONE HCL 10 MG PO TABS
10.0000 mg | ORAL_TABLET | ORAL | 0 refills | Status: DC | PRN
  Filled 2024-06-02: qty 30, 5d supply, fill #0

## 2024-06-02 MED ORDER — DULOXETINE HCL 60 MG PO CPEP
60.0000 mg | ORAL_CAPSULE | Freq: Every day | ORAL | 0 refills | Status: DC
  Filled 2024-06-02: qty 30, 30d supply, fill #0

## 2024-06-02 MED ORDER — DEXAMETHASONE 2 MG PO TABS
ORAL_TABLET | ORAL | 0 refills | Status: DC
  Filled 2024-06-02: qty 35, 28d supply, fill #0

## 2024-06-02 MED ORDER — ACETAMINOPHEN 500 MG PO TABS
1000.0000 mg | ORAL_TABLET | Freq: Three times a day (TID) | ORAL | 0 refills | Status: DC
  Filled 2024-06-02: qty 30, 5d supply, fill #0

## 2024-06-02 MED ORDER — NALOXONE HCL 4 MG/0.1ML NA LIQD
NASAL | 0 refills | Status: DC
  Filled 2024-06-02: qty 2, fill #0

## 2024-06-02 MED ORDER — PREGABALIN 150 MG PO CAPS
150.0000 mg | ORAL_CAPSULE | Freq: Three times a day (TID) | ORAL | 0 refills | Status: DC
  Filled 2024-06-02: qty 90, 30d supply, fill #0

## 2024-06-02 MED ORDER — MONTELUKAST SODIUM 10 MG PO TABS
10.0000 mg | ORAL_TABLET | Freq: Every day | ORAL | 0 refills | Status: DC
  Filled 2024-06-02: qty 30, 30d supply, fill #0

## 2024-06-02 MED ORDER — TRAZODONE HCL 50 MG PO TABS
50.0000 mg | ORAL_TABLET | Freq: Every evening | ORAL | 0 refills | Status: DC | PRN
  Filled 2024-06-02: qty 15, 15d supply, fill #0

## 2024-06-02 MED ORDER — CLONAZEPAM 0.5 MG PO TABS
0.5000 mg | ORAL_TABLET | Freq: Three times a day (TID) | ORAL | 0 refills | Status: DC | PRN
  Filled 2024-06-02: qty 30, 10d supply, fill #0

## 2024-06-02 MED ORDER — VITAMIN D3 25 MCG PO TABS
1000.0000 [IU] | ORAL_TABLET | Freq: Every day | ORAL | 0 refills | Status: DC
  Filled 2024-06-02: qty 30, 30d supply, fill #0

## 2024-06-02 MED ORDER — POLYETHYLENE GLYCOL 3350 17 GM/SCOOP PO POWD
17.0000 g | Freq: Two times a day (BID) | ORAL | 0 refills | Status: DC
  Filled 2024-06-02: qty 238, 7d supply, fill #0

## 2024-06-02 MED ORDER — LEVETIRACETAM 500 MG PO TABS
500.0000 mg | ORAL_TABLET | Freq: Two times a day (BID) | ORAL | 0 refills | Status: DC
  Filled 2024-06-02: qty 60, 30d supply, fill #0

## 2024-06-02 MED ORDER — TIZANIDINE HCL 2 MG PO TABS
2.0000 mg | ORAL_TABLET | Freq: Three times a day (TID) | ORAL | 0 refills | Status: DC
  Filled 2024-06-02: qty 120, 40d supply, fill #0

## 2024-06-02 MED ORDER — TAMSULOSIN HCL 0.4 MG PO CAPS
0.4000 mg | ORAL_CAPSULE | Freq: Every day | ORAL | 0 refills | Status: DC
  Filled 2024-06-02: qty 30, 30d supply, fill #0

## 2024-06-02 MED ORDER — SENNOSIDES-DOCUSATE SODIUM 8.6-50 MG PO TABS
1.0000 | ORAL_TABLET | Freq: Every day | ORAL | 0 refills | Status: DC
  Filled 2024-06-02: qty 30, 30d supply, fill #0

## 2024-06-02 MED ORDER — SODIUM ZIRCONIUM CYCLOSILICATE 5 G PO PACK
5.0000 g | PACK | Freq: Once | ORAL | Status: AC
  Administered 2024-06-02: 5 g via ORAL
  Filled 2024-06-02: qty 1

## 2024-06-02 MED ORDER — SENNOSIDES-DOCUSATE SODIUM 8.6-50 MG PO TABS: 1.0000 | ORAL_TABLET | Freq: Every day | ORAL | Status: DC

## 2024-06-02 MED ORDER — BACLOFEN 5 MG PO TABS
5.0000 mg | ORAL_TABLET | Freq: Three times a day (TID) | ORAL | 0 refills | Status: DC
  Filled 2024-06-02: qty 90, 30d supply, fill #0

## 2024-06-02 MED ORDER — PANTOPRAZOLE SODIUM 40 MG PO TBEC
40.0000 mg | DELAYED_RELEASE_TABLET | Freq: Every day | ORAL | 0 refills | Status: DC
  Filled 2024-06-02: qty 30, 30d supply, fill #0

## 2024-06-02 MED ORDER — APIXABAN 5 MG PO TABS
ORAL_TABLET | ORAL | 0 refills | Status: DC
  Filled 2024-06-02: qty 58, 25d supply, fill #0

## 2024-06-02 NOTE — Progress Notes (Addendum)
 PROGRESS NOTE   Subjective/Complaints:   No events overnight. Vitals stable     06/02/2024    4:14 AM 06/01/2024    8:29 PM 06/01/2024    3:15 PM  Vitals with BMI  Systolic 119 116 893  Diastolic 89 79 84  Pulse 87 87 99    No results for input(s): GLUCAP in the last 72 hours.  AM labs with mild hyperkalemia; otherwise stable.  P.o. intakes appropriate  Continent of bladder  Last BM 11/9, multiple smears.  Last large bowel movement 11-8.  ROS:  Denies Chest pain or SOB Positive for bilateral hip pain/spasms, bilateral foot sensory reduction, +left lower extremity swelling   Objective:   VAS US  LOWER EXTREMITY VENOUS (DVT) Result Date: 06/01/2024  Lower Venous DVT Study Patient Name:  Roger Black [facility]  Date of Exam:   05/31/2024 Medical Rec #: [id number]             Accession #:    [id number] Date of Birth: [birth year]             Patient Gender: M Patient Age:   68 years Exam Location:  [facility] Procedure:      VAS US  LOWER EXTREMITY VENOUS (DVT) Referring Phys: SVEN ELKS --------------------------------------------------------------------------------  Indications: Swelling.  Comparison Study: Prior negative bilateral LEV done 02/13/24 Performing Technologist: Alberta Lis RVS  Examination Guidelines: A complete evaluation includes B-mode imaging, spectral Doppler, color Doppler, and power Doppler as needed of all accessible portions of each vessel. Bilateral testing is considered an integral part of a complete examination. Limited examinations for reoccurring indications may be performed as noted. The reflux portion of the exam is performed with the patient in reverse Trendelenburg.  +-----+---------------+---------+-----------+----------+--------------+ RIGHTCompressibilityPhasicitySpontaneityPropertiesThrombus Aging +-----+---------------+---------+-----------+----------+--------------+ CFV  Full            Yes      Yes                                 +-----+---------------+---------+-----------+----------+--------------+ SFJ  Full                                                        +-----+---------------+---------+-----------+----------+--------------+   +---------+---------------+---------+-----------+----------+-----------------+ LEFT     CompressibilityPhasicitySpontaneityPropertiesThrombus Aging    +---------+---------------+---------+-----------+----------+-----------------+ CFV      Full           Yes      Yes                                    +---------+---------------+---------+-----------+----------+-----------------+ SFJ      Full                                                           +---------+---------------+---------+-----------+----------+-----------------+  FV Prox  Full           Yes      No                                     +---------+---------------+---------+-----------+----------+-----------------+ FV Mid   Partial        Yes      No                   Age Indeterminate +---------+---------------+---------+-----------+----------+-----------------+ FV DistalPartial                                      Age Indeterminate +---------+---------------+---------+-----------+----------+-----------------+ PFV      Full           Yes      No                                     +---------+---------------+---------+-----------+----------+-----------------+ POP      Full           Yes      Yes                                    +---------+---------------+---------+-----------+----------+-----------------+ PTV      Partial                                      Age Indeterminate +---------+---------------+---------+-----------+----------+-----------------+ PERO     Partial                                      Age Indeterminate +---------+---------------+---------+-----------+----------+-----------------+ Gastroc   Full                                                           +---------+---------------+---------+-----------+----------+-----------------+     Summary: RIGHT: - No evidence of common femoral vein obstruction.   LEFT: - Findings consistent with age indeterminate deep vein thrombosis involving the left femoral vein, left posterior tibial veins, and left peroneal veins.  - No cystic structure found in the popliteal fossa.  *See table(s) above for measurements and observations. Electronically signed by Debby Robertson on 06/01/2024 at 2:12:59 PM.    Final      Recent Labs    06/02/24 0457  WBC 4.9  HGB 12.8*  HCT 37.7*  PLT 66*    Recent Labs    06/02/24 0457  NA 135  K 5.3*  CL 101  CO2 23  GLUCOSE 112*  BUN 30*  CREATININE 0.97  CALCIUM 8.7*     Intake/Output Summary (Last 24 hours) at 06/02/2024 0824 Last data filed at 06/02/2024 0512 Gross per 24 hour  Intake 480 ml  Output 550 ml  Net -70 ml        Physical Exam: Vital Signs Blood pressure 119/89, pulse 87, temperature 98.5 F (36.9  C), resp. rate 19, height 5' 8 (1.727 m), weight 91 kg, SpO2 96%.   Constitutional: No distress . Vital signs reviewed.  Sitting up in wheelchair at bedside.  HEENT: NCAT, EOMI, oral membranes moist. Neck: supple, no JVD. Cardiovascular: RRR without murmur. No JVD    Respiratory/Chest:  Clear to auscultation bilaterally.  No rales, rhonchi, or wheezing. GI/Abdomen: BS + hypoactive, non-tender, nondistended Ext: no clubbing, cyanosis, +LLE edema--stbale Psych: Awake, alert, appropriate.  Jovial.  Skin: C/D/I. No apparent lesions. + Craniotomy-staples removed, well-approximated + PIV c/d/i  MSK:      No apparent deformity.   Cognition: AAO to person, place, time + decreased concentration/awareness, appropriate with conversations but does not carryover well. + tangential   Insight: Fair insight Sensation:  reduced in bilateral lower extremities CN: L eye difficulty  converging, otherwise intact--no change Coordination:  Bilateral upper extremity loss of tone in hands with intention --no longer present on exam Spasticity: MAS 0 in all extremities.          Strength: Unchanged                LUE: 5/5 SA, 5/5 EF, 5/5 EE, 5/5 WE, 5/5 FF, 5/5 FA                RUE:  5/5 SA, 5/5 EF, 5/5 EE, 5/5 WE, 5/5 FF, 5/5 FA                RLE: 3-/5 HF, 3/5 KE, 4-/5  DF, 4-/5 PF                LLE: 3/5 HF, 3/5 KE, 3/5  DF, 3/5 PF   Physical exam unchanged from the above on reexamination 06/02/24    Assessment/Plan: 1. Functional deficits which require 3+ hours per day of interdisciplinary therapy in a comprehensive inpatient rehab setting. Physiatrist is providing close team supervision and 24 hour management of active medical problems listed below. Physiatrist and rehab team continue to assess barriers to discharge/monitor patient progress toward functional and medical goals  Care Tool:  Bathing    Body parts bathed by patient: Right arm, Left arm, Abdomen, Chest, Front perineal area, Right upper leg, Left upper leg, Right lower leg, Left lower leg, Face, Buttocks   Body parts bathed by helper: Buttocks     Bathing assist Assist Level: Contact Guard/Touching assist     Upper Body Dressing/Undressing Upper body dressing   What is the patient wearing?: Pull over shirt    Upper body assist Assist Level: Supervision/Verbal cueing    Lower Body Dressing/Undressing Lower body dressing      What is the patient wearing?: Underwear/pull up, Pants     Lower body assist Assist for lower body dressing: Supervision/Verbal cueing     Toileting Toileting    Toileting assist Assist for toileting: Contact Guard/Touching assist     Transfers Chair/bed transfer  Transfers assist     Chair/bed transfer assist level: Contact Guard/Touching assist     Locomotion Ambulation   Ambulation assist      Assist level: Contact Guard/Touching  assist Assistive device: Walker-rolling Max distance: 170   Walk 10 feet activity   Assist     Assist level: Contact Guard/Touching assist Assistive device: Walker-rolling   Walk 50 feet activity   Assist Walk 50 feet with 2 turns activity did not occur: Safety/medical concerns  Assist level: Contact Guard/Touching assist Assistive device: Walker-rolling    Walk 150 feet activity   Assist Walk  150 feet activity did not occur: Safety/medical concerns  Assist level: Contact Guard/Touching assist Assistive device: Walker-rolling    Walk 10 feet on uneven surface  activity   Assist Walk 10 feet on uneven surfaces activity did not occur: Safety/medical concerns         Wheelchair     Assist Is the patient using a wheelchair?: Yes Type of Wheelchair: Manual    Wheelchair assist level: Supervision/Verbal cueing Max wheelchair distance: 150    Wheelchair 50 feet with 2 turns activity    Assist        Assist Level: Supervision/Verbal cueing   Wheelchair 150 feet activity     Assist      Assist Level: Supervision/Verbal cueing   Blood pressure 119/89, pulse 87, temperature 98.5 F (36.9 C), resp. rate 19, height 5' 8 (1.727 m), weight 91 kg, SpO2 96%.  Medical Problem List and Plan: 1. 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 Daughter will be moving in with them. Myoclonus in [place] remains challenging; Min A ADLs overall min-mod for transfers. Amb 15 ft with RW limited by pain. Mild-moderate cognitive deficits variable depending on XRT.   - 10-31: More confused today, hypotensive, leukocytosis of 15  concerning for infection.  Most likely cause is urinary given retention, but will also check chest x-ray given cough.  IV fluids for AKI.  Notable that patient started radiation therapy for his brain on 10-29  -11/2 [facility] for left foot  - 11/4: Fluctuating participation with myoclonus/jerking; improved this week. Goals are supervision for PT. UBD SPV, Min A with reacher LB. Jerking and cognition improving.    - Messaged Dr. Dewey with radiation oncology per patient request-PA was kind enough to talk to him yesterday, addressed his current treatments and monitoring moving forward.  Very much appreciate her taking the time to do this!  Discussed lifestyle strategies for cancer treatment  2.  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 Discussed risks and benefits of treatment  11/8; Duplex with LLE  age indeterminate deep vein thrombosis involving the left femoral vein, left posterior tibial veins, and left peroneal veins; started on treatment dose Eliquis  3. Pain Management/spasms: percocet prn. Discussed Quten[facility] outpatient and he was interested in this for his bilateral lower extremity neuropathy  - 10-24: Extreme difficulty tolerating therapies due to back and lower hip pain and spasms with mobility.  baclofen to 10 mg 3 times daily, add tizanidine 2 mg 3 times daily, schedule OxyContin 10 mg every 12 hours, continue as needed Percocet 5 mg every 4 hours.  - 10-25: Increased OxyContin to 15 mg twice daily, increase baclofen to 15 mg 3 times daily; immediately notified next therapy session the patient was getting lethargic with these changes, so reverted to prior  - 10-26: Patient pain much improved on current regimen.  Unfortunately, appears to be somewhat sedated and euphoric on OxyContin 10 mg twice daily.  Not using as needed oxycodone, will instead schedule 5 mg 2 times daily before therapies  in addition to as needed and DC long-acting.   - 10/27: perhaps a  little more pain today but cognitively seemed more appropriate compared to what's recorded- no changes to regimen  10-28: Pain under better control, continues with spasms but seems to be doing better overall.  10-30: Add Elavil 25 mg nightly for peripheral nerve pain at nighttime; already optimized on duloxetine, Lyrica  - 10-31: DC Elavil; did work well for pain, once causes of encephalopathy treated as below May retrial.  Reduce baclofen to 5 mg 3 times daily due to AKI.  -11/1-2 Reports continued back pain, overall under control with oxycodone--tolerating current regimen, continue  - 11-5: Increase as needed oxycodone from 5 to 5 to 10 mg--well-controlled on current regimen  4. Mood/Behavior/Sleep: LCSW to follow for evaluation and support             --continue ambien prn.              -antipsychotic agents: N/A  - 10-29: Developing some confusion overnight.  Schedule Ambien as he is using it nightly, 5 mg.  Add as needed trazodone 50 mg nightly.  - 10-30: No reports of confusion overnight.  Dors is sleeping well.  - 10-31: Grossly encephalopathic today, workup reveals leukocytosis of 15, AKI 1.69, and positive UA.  Delirium precautions, treatment as below.   -11/2 DC ambien per patient request, bad dreams reported  - 11-4: Sleeping well at night, no current changes warranted.   - 11/6: not using trazodone  5. Neuropsych/cognition: This patient may be intermittently  capable of making decisions on his own behalf. 6. New skin tear to left thumb: monitor daily  7. Fluids/Electrolytes/Nutrition: Monitor I/O.   8.  Hyperglycemia due to steroids for lung cancer w/brain mets: XRT started today and at end of day as able to help with tolerance of therapy             --on  Decadron 4 mg TID and fasting BS 120-150 range.  --Will check A1c in am--5.7, prediabetic  fair control overall, continue to monitor  No results for input(s): GLUCAP in the last 72 hours.    9. COPD: On Singulair with  albuterol prn.  10. HTN: Monitor BP TID---on avapro and cardura.   - Monitor with therapies today  - 10-25: Increase Cardura for urinary retention to 3 mg nightly  - 10-26: Blood pressures trending low; reduce Avapro 300 to 150 mg daily.  May need to reduce Cardura again if BP does not tolerate.  10/27 hold Avapro--BP improved 10-28--monitor on current regimen  10-31: Hypotension likely secondary to SIRS, give 1 L IV fluid bolus today, may need more fluids through the weekend.  Hold Cardura until BP improves.  - well controlled, continue current    06/02/2024    4:14 AM 06/01/2024    8:29 PM 06/01/2024    3:15 PM  Vitals with BMI  Systolic 119 116 893  Diastolic 89 79 84  Pulse 87 87 99    11. CAD: continue Avapro and statin. Monitor for symptoms with increase in activity.              --continue to hold spironolactone and torsemide.    - weights stable Filed Weights   05/15/24 1727 05/16/24 1544 05/17/24 0504  Weight: 91.1 kg 91.1 kg 91 kg    12.  Hyponatremia: Na 132 @ admission-->131.  --Recheck BMET in am--stable 10/27 stable at 132 10-31: 131 today, likely secondary to AKI as  below.  11-1 stable 134  13. Acute on chronic renal failure: Encourage fluid intake.  BUN/SCr 27/1.32-->61/1.38 -- Monitor potassium levels as baseline 4.9-5.0-->continue ARB for now.  - Recheck CMET in am--improving BUN 37, creatinine 1.14, potassium 4.5 -10/27 BUN/Cr back up to 62/1.6 today, k+ 5.1 - hold avapro for now, push fluids and recheck in AM 10-28: BUN down to 47, creatinine normalized 1.09.  Continue to encourage p.o. fluids. 10-31: Recurrent AKI today, creatinine 1.69.  NA 131, potassium 5.2.  Give 1 L IV fluids, reduce baclofen as above, repeat BMP tonight, depending on results may give additional 1 L.  Repeat BMP in AM. 11/1 BUN and Cr improved to 52/1.16--> back to baseline, creatinine 0.9 11/6: BUN up to 55 - give I L IVF, push POs, repeat in AM 11/10: Mild K increase, other vitals  stable. Give lokelma 5 g once. Repeat in AM.  14.  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  - Baclofen 10 mg 3 times daily  - Tizanidine 2 mg 3 times daily  - 10-25: Start dantrolene 25 mg daily, after 1 week test LFTs and increase to 3 times daily if tolerated.  Add clonazepam 0.5 mg 3 times daily as needed for myoclonus.  May need to add VPA if myoclonus worsens.  10-26/27: Spasms much better controlled, has not needed to use clonazepam yet.  Trend.  10-28: Start low-dose Keppra 500 mg twice daily for upper extremity myoclonus--notable improvement 10/29!  10/31: More myoclonus noted today, complicated by AKI and likely UTI as below.  Treat underlying causes, if continues to be persistent would recommend EEG over the weekend.  -11/2 Not having any today, continue to monitor. If it reoccurs pt advised to let nursing know, can consider EEG   - 11-3/4: Reports no further myoclonus for the last 2 days.  Continue Keppra, if recurs will get EEG.   - resolved  15.  Neurogenic bladder/urinary retention.  Likely secondary to cauda equina involvement, flaccid bladder.  - Ongoing requirement of intermittent straight cathing; increase Cardura to 3 mg nightly  - 10-26: Added Urecholine 10 mg 3 times daily--if no response with up titration/intolerable side effects, would transition towards patient learning intermittent straight cathing   -10/27 cathed for 800cc this am.    -urecholine just added, consider increase in AM to 25mg  if still retaining   -encouraged up to [name] or toilet to empty  - 10-28: Increase frequency of cath to every 4 hours due to large volumes, increase Urecholine to 25 mg 3 times daily.  If no improvement with these measures, will need to drain patient on straight cathing versus place Foley based on upper extremity coordination and bladder volumes greater than 2.5 L/day.  - 10-29: Straight caths volumes better.  Continue Urecholine through today,  renal ultrasound for structural cause assessment.  If abdominal cramping from Urecholine intolerable, will DC and discuss placement of Foley catheter  -10-30: Long discussion with patient, his wife regarding likely flaccid bladder secondary to cauda equina involvement.  Patient not agreeable to learning straight cathing at this time, wants Foley placed on discharge; will continue to discuss this with them  10-31: UTI associated with urinary retention, DC bethanechol.  Reducing Cardura due to hypotension as above.  Called patient's wife to discuss Foley placement given IV fluids and already currently needing every 4 hour caths to avoid high volumes, went to voicemail, will need her consent before placement.  11/1 foley was started, continue  Rocephin  11/2 Continue Rocephin, pansensitive ecoli--will reattempt DC Foley 1 week after placement if still at rehab.  Will need urology consult as outpatient  11-4: Vital stable, switch Rocephin to p.o. Keflex for 3 days.  11-6: Patient agreeable to DC Foley trial starting tomorrow, for 48 hours .  Resume Flomax 0.4 mg today.- -Start DC Foley trial 11-7--if no continent voids within 48 hours, would replace Foley catheter 11-10: PVRs low, continent voids through the weekend!  16.  Constipation/diarrhea.  - 10-29: Liquid bowel movements yesterday after sorbitol.  Move MiraLAX to as needed, reduce Senokot S to nightly.  Monitor today due to possibility of abdominal cramping with Urecholine.  - 10-30: Patient endorsing constipation, will increase Senokot S2 1 tab twice daily and MiraLAX daily  10-31: No bowel movement, will give sorbitol 1 back tonight.  11/2 Sorbitol 30ml ordered--not given  continue MiraLAX twice daily  Last bowel movement 11/9, small/smear; BM 11-8 large.  Move Senokot-S to nightly only.  17.  Leukocytosis, likely secondary to urinary tract infection  - Start ceftriaxone 2 g daily; follow cultures  - DC bethanechol as above  - Will discuss  with wife Foley placement given already need for frequent caths, IV fluid as above  - Pending results of checks x-ray given cough to look for alternate causes  -11/1 WBC back to 9.6  11-3: WBC normalized.  18. Aspiration pneumonia  continue rocephin, SLP  -Afebrile  - 11/3: No concern for bedsde swallow   19. LLE swelling: vas US  ordered, see #2  20. Left sided facial tingling: discussed this is intermittent in nature, not greatly bothersome to him, discussed possible cranial nerve 5 irritation, vitamin D supplemented added as can help   LOS: 18 days A FACE TO FACE EVALUATION WAS PERFORMED  Roger Black Likes 06/02/2024, 8:24 AM

## 2024-06-02 NOTE — Progress Notes (Signed)
 Speech Language Pathology Discharge Summary  Patient Details  Name: Roger Black MRN: [id number] Date of Birth: 11/22/1955  Date of Discharge from SLP service:June 02, 2024  Today's Date: 06/02/2024 SLP Individual Time: 8687-8588 SLP Individual Time Calculation (min): 59 min   Skilled Therapeutic Interventions:   Skilled therapy session focused on cognitive and dysphagia goals. Upon entrance, patient requesting assistance in utilizing computer to purchase TTB. SLP facilitated session by providing minA for patient to utilize technology to choose and purchase  tub transfer bench for home. Patient then requested transfer to bedside commode for bowel movement. SLP assisted in transfer and pericare. Upon completion of toileting, SLP targeted dysphagia through offering PO of regular solids and thin liquids. Patient with timely mastication and complete oral clearance. Patient complaining of intermittent GERD in which he states MD is aware (patient is on Protonix), though no s/sx of aspiration. Patient reports feeling of heartburn decreases when utilizing liquid/puree wash. Continue current diet. Patient left in bed with alarm set and call bell in reach. Continue POC.      Patient has met 3 of 3 long term goals.  Patient to discharge at overall Supervision;Min level.  Reasons goals not met: n/a   Clinical Impression/Discharge Summary:  Pt has made great gains and has met 3 of 3 LTG's this admission due to improved cognition and dysphagia. Pt is currently an overall minA for cognitive tasks and requires supervision cues for utilization of swallowing compensatory strategies to minimize overt s/sx of aspiration with regular/thin diet. Pt/family education complete and pt will discharge home with supervision from friends/family/etc. Pt would benefit from [facility] f/u ST services to maximize cognition and dysphagia in order to maximize functional independence.   Care Partner:  Caregiver Able to  Provide Assistance: Yes  Type of Caregiver Assistance: Physical;Cognitive  Recommendation:  Home Health SLP  Rationale for SLP Follow Up: Maximize cognitive function and independence   Equipment: n/a   Reasons for discharge: Discharged from hospital;Treatment goals met   Patient/Family Agrees with Progress Made and Goals Achieved: Yes    Tigerlily Christine M.A., CCC-SLP 06/02/2024, 1:47 PM

## 2024-06-02 NOTE — Progress Notes (Signed)
 Occupational Therapy Discharge Summary  Patient Details  Name: Roger Black MRN: [id number] Date of Birth: 1956/07/11  Date of Discharge from OT service:June 02, 2024  Today's Date: 06/02/2024 OT Individual Time: 9266-9154 OT Individual Time Calculation (min): 72 min    Patient has met 8 of 8 long term goals due to improved activity tolerance, improved balance, postural control, ability to compensate for deficits, functional use of  RIGHT upper and LEFT upper extremity, improved attention, improved awareness, and improved coordination.  Patient to discharge at overall CGA level.  Patient's care partner is independent to provide the necessary physical and cognitive assistance at discharge. Pt continues to present with diplopia in L gaze especially, he is wearing partially occluded glasses. His wife and step daughter have attended family education. Drayke continues to have fluctuations in ability   Recommendation:  Patient will benefit from ongoing skilled OT services in home health setting to continue to advance functional skills in the area of BADL and iADL.  Equipment: BSC, tub transfer bench  Reasons for discharge: treatment goals met and discharge from hospital  Patient/family agrees with progress made and goals achieved: Yes  Skilled OT Intervention Pt received supine with no c/o pain, agreeable to OT session. Discussed d/c at length. Lengthy discussion and education provided re ongoing diplopia. Pt wanting to get a patch for his L eye- discouraged full occlusion and provided education on benefits of partial occlusion if tolerated. He came to EOB with (S). He stood with RW with CGA. He completed a stand pivot transfer to the w/c with CGA using the RW. He completed oral care seated at the sink with set up assist. He then completed shaving task as well. While he shaved, gathered several education handouts for home. Double vision handout sent home as well and reviewed. He  propelled his w/c to the therapy gym with (S). He completed stand pivot transfer to the w/c with CGA using the RW. He then worked on sit > stands with no UE support with heavy focus on proper body mechanics and head-hip relationship. He required mod A from low surface during this activity. Discussed carryover to home and community transfers and choosing chairs with arms. He returned to the w/c and to his room. Pt was left sitting up in the wheelchair with all needs met, chair alarm set, and call bell within reach.    OT Discharge Precautions/Restrictions  Precautions Precautions: Fall Precaution/Restrictions Comments: BP <150 Restrictions Weight Bearing Restrictions Per Provider Order: No  ADL ADL Eating: Supervision/safety Where Assessed-Eating: Chair Grooming: Supervision/safety Where Assessed-Grooming: Sitting at sink Upper Body Bathing: Supervision/safety Where Assessed-Upper Body Bathing: Sitting at sink Lower Body Bathing: Moderate assistance Where Assessed-Lower Body Bathing: Sitting at sink, Standing at sink Upper Body Dressing: Minimal assistance Where Assessed-Upper Body Dressing: Sitting at sink Lower Body Dressing: Moderate assistance Where Assessed-Lower Body Dressing: Sitting at sink, Standing at sink Toileting: Moderate assistance Where Assessed-Toileting: Toilet, Psychiatrist Transfer: Moderate assistance Toilet Transfer Method: Stand pivot Tub/Shower Transfer: Unable to assess Vision Baseline Vision/History: 1 Wears glasses Patient Visual Report: Diplopia Vision Assessment?: Yes Eye Alignment: Within Functional Limits Ocular Range of Motion: Within Functional Limits Alignment/Gaze Preference: Within Defined Limits Tracking/Visual Pursuits: Right eye does not track laterally Saccades: Additional eye shifts occurred during testing Convergence: Impaired (comment) Visual Fields: No apparent deficits Diplopia Assessment: Objects split side to  side;Present in primary gaze;Present all the time/all directions Perception  Perception: Within Functional Limits Praxis Praxis: WFL Cognition Cognition Overall Cognitive Status:  Impaired/Different from baseline Arousal/Alertness: Awake/alert Orientation Level: Person;Place;Situation Person: Oriented Place: Oriented Situation: Oriented Memory: Impaired Memory Impairment: Retrieval deficit;Decreased short term memory Decreased Short Term Memory: Verbal complex;Functional complex Attention: Selective Selective Attention: Appears intact Awareness: Appears intact Problem Solving: Impaired Problem Solving Impairment: Functional complex Safety/Judgment: Appears intact Brief Interview for Mental Status (BIMS) Repetition of Three Words (First Attempt): 3 Temporal Orientation: Year: Correct Temporal Orientation: Month: Accurate within 5 days Temporal Orientation: Day: Correct Recall: Sock: No, could not recall Recall: Blue: Yes, no cue required Recall: Bed: Yes, no cue required BIMS Summary Score: 13 Sensation Sensation Light Touch: Impaired Detail Central sensation comments: BLE tingling and sensation deficits knees down Light Touch Impaired Details: Impaired RLE;Impaired LLE Coordination Gross Motor Movements are Fluid and Coordinated: No Fine Motor Movements are Fluid and Coordinated: No Coordination and Movement Description: generalized weakness, pain limiting, myoclonic jerking in hands improved Motor  Motor Motor: Ataxia Motor - Discharge Observations: Ataxia, muscle weakness Mobility  Bed Mobility Bed Mobility: Sit to Supine;Supine to Sit Supine to Sit: Supervision/Verbal cueing Sit to Supine: Supervision/Verbal cueing Transfers Sit to Stand: Contact Guard/Touching assist Stand to Sit: Contact Guard/Touching assist  Trunk/Postural Assessment  Cervical Assessment Cervical Assessment: Within Functional Limits Thoracic Assessment Thoracic Assessment: Within  Functional Limits Lumbar Assessment Lumbar Assessment: Within Functional Limits Postural Control Postural Control: Deficits on evaluation Righting Reactions: delayed  Balance Balance Balance Assessed: Yes Static Sitting Balance Static Sitting - Balance Support: Bilateral upper extremity supported Static Sitting - Level of Assistance: 6: Modified independent (Device/Increase time) Dynamic Sitting Balance Dynamic Sitting - Balance Support: Bilateral upper extremity supported Dynamic Sitting - Level of Assistance: 5: Stand by assistance Static Standing Balance Static Standing - Balance Support: Bilateral upper extremity supported Static Standing - Level of Assistance: 5: Stand by assistance Dynamic Standing Balance Dynamic Standing - Balance Support: Bilateral upper extremity supported Dynamic Standing - Level of Assistance: 4: Min assist;5: Stand by assistance (CGA) Extremity/Trunk Assessment RUE Assessment RUE Assessment: Within Functional Limits LUE Assessment LUE Assessment: Within Functional Limits   [name] 06/02/2024, 8:01 AM

## 2024-06-02 NOTE — Progress Notes (Signed)
 Dressing on traumatic wound educated and sent home with wound care supplies.

## 2024-06-02 NOTE — Telephone Encounter (Signed)
 Pharmacy Patient Advocate Encounter  Insurance verification completed.    The patient is insured through [employer]/ [employer]. Patient has [employer], may use a copay card, and/or apply for patient assistance if available.    Ran test claim for Eliquis 5mg  and the current 30 day co-pay is $47.00   This test claim was processed through [employer]- copay amounts may vary at other pharmacies due to [employer], or as the patient moves through the different stages of their insurance plan.

## 2024-06-02 NOTE — Discharge Instructions (Addendum)
 Inpatient Rehab Discharge Instructions  Roger Black Discharge date and time: 06/03/2024   Activities/Precautions/ Functional Status: Activity: no lifting, driving, or strenuous exercise till cleared by MD Diet: regular diet Wound Care: keep wound clean and dry   Functional status:  ___ No restrictions      _X__ Walk up steps independently _X__ 24/7 supervision/assistance   ___ Walk up steps with assistance ___ Intermittent supervision/assistance  ___ Bathe/dress independently ___ Walk with walker     ___ Bathe/dress with assistance ___ Walk Independently      ___ Shower independently _X__ Walk with Contact assistance    _X__ Shower with assistance _X__ No alcohol        _N/A__ Return to work/school ________  Special Instructions: Limit dairy, tomatoes, citrus fruits and bananas to one serving total per day.     COMMUNITY REFERRALS UPON DISCHARGE:    Home Health:   PT      OT        SNA                 Agency: [facility]    Phone: [phone] *Please expect follow-up within 2-3 business days for discharge to schedule your home visit. If you have not received follow-up, be sure to contact the site directly.*     Medical Equipment/Items Ordered:hospital bed, 3in1 bedside commode, wheelchair                                                 Agency/Supplier:[facility] [phone]    My questions have been answered and I understand these instructions. I will adhere to these goals and the provided educational materials after my discharge from the hospital.  Patient/Caregiver Signature _______________________________ Date __________  Clinician Signature _______________________________________ Date __________  Please bring this form and your medication list with you to all your follow-up doctor's appointments. Information on my medicine - ELIQUIS (apixaban)  Why was Eliquis prescribed for you? Eliquis was prescribed to treat blood clots that may have been  found in the veins of your legs (deep vein thrombosis) or in your lungs (pulmonary embolism) and to reduce the risk of them occurring again.  What do You need to know about Eliquis ? The starting dose is 10 mg (two 5 mg tablets) taken TWICE daily for the FIRST SEVEN (7) DAYS, then on 06/07/24 the dose is reduced to ONE 5 mg tablet taken TWICE daily.  Eliquis may be taken with or without food.   Try to take the dose about the same time in the morning and in the evening. If you have difficulty swallowing the tablet whole please discuss with your pharmacist how to take the medication safely.  Take Eliquis exactly as prescribed and DO NOT stop taking Eliquis without talking to the doctor who prescribed the medication.  Stopping may increase your risk of developing a new blood clot.  Refill your prescription before you run out.  After discharge, you should have regular check-up appointments with your healthcare provider that is prescribing your Eliquis.    What do you do if you miss a dose? If a dose of ELIQUIS is not taken at the scheduled time, take it as soon as possible on the same day and twice-daily administration should be resumed. The dose should not be doubled to make up for a missed dose.  Important  Safety Information A possible side effect of Eliquis is bleeding. You should call your healthcare provider right away if you experience any of the following: Bleeding from an injury or your nose that does not stop. Unusual colored urine (red or dark brown) or unusual colored stools (red or black). Unusual bruising for unknown reasons. A serious fall or if you hit your head (even if there is no bleeding).  Some medicines may interact with Eliquis and might increase your risk of bleeding or clotting while on Eliquis. To help avoid this, consult your healthcare provider or pharmacist prior to using any new prescription or non-prescription medications, including herbals, vitamins,  non-steroidal anti-inflammatory drugs (NSAIDs) and supplements.  This website has more information on Eliquis (apixaban): http://[email or web address].com/eliquis/home

## 2024-06-02 NOTE — Progress Notes (Signed)
 Physical Therapy Discharge Summary  Patient Details  Name: Roger Black MRN: [id number] Date of Birth: [birth year]  Date of Discharge from PT service:June 02, 2024  Today's Date: 06/02/2024 PT Individual Time: 1004-1058 PT Individual Time Calculation (min): 54 min    Patient has met 4 of 9 long term goals due to improved activity tolerance, improved balance, improved postural control, and increased strength.  Patient to discharge at an ambulatory level CGA.   Patient's care partner is independent to provide the necessary physical and cognitive assistance at discharge.  Reasons goals not met: Pt requires CGA during mobility due to frequency with which both knees buckle. Pt's wife and step-daughter attended family education and able to provide assistance for safety.   Recommendation:  Patient will benefit from ongoing skilled PT services in home health setting to continue to advance safe functional mobility, address ongoing impairments in balance, strength, transfers, ambulationg, and minimize fall risk.  Equipment: Recommended manual WC and RW  Reasons for discharge: discharge from hospital  Patient/family agrees with progress made and goals achieved: Yes  Skilled Therapeutic Interventions: Pt received seated in [facility] and agrees to therapy. Reports some pain but improved from previous sessions. PT provides rest breaks as needed to manage pain. Pt self propels WC x300' with BUEs to work on mobility and endurance training. Pt completes car transfer with CGA and cues for sequencing, positioning, and safety. Seated rest break.   Pt stands and ambulates up and down ramp, then x150' with RW and cues for upright gaze to improve posture and balance. Cues also provided to limit WB through RW for energy conservation and improved body mechanics.  Pt completes x2 4 curb steps with RW and cues for step sequencing and safe AD management. Pt takes seated rest break.   WC transport back to  room. Left seated with all needs within reach.    PT Discharge Precautions/Restrictions Precautions Precautions: Fall Precaution/Restrictions Comments: BP <150 Restrictions Weight Bearing Restrictions Per Provider Order: No Pain Interference Pain Interference Pain Effect on Sleep: 2. Occasionally Pain Interference with Therapy Activities: 2. Occasionally Pain Interference with Day-to-Day Activities: 2. Occasionally Vision/Perception  Vision - History Ability to See in Adequate Light: 1 Impaired Vision - Assessment Eye Alignment: Within Functional Limits Ocular Range of Motion: Within Functional Limits Alignment/Gaze Preference: Within Defined Limits Tracking/Visual Pursuits: Right eye does not track laterally Saccades: Additional eye shifts occurred during testing Convergence: Impaired (comment) Diplopia Assessment: Objects split side to side;Present in primary gaze;Present all the time/all directions Perception Perception: Within Functional Limits Praxis Praxis: WFL  Cognition Overall Cognitive Status: Impaired/Different from baseline Arousal/Alertness: Awake/alert Attention: Selective Selective Attention: Appears intact Memory: Impaired Memory Impairment: Retrieval deficit;Decreased short term memory Decreased Short Term Memory: Verbal complex;Functional complex Awareness: Appears intact Problem Solving: Impaired Problem Solving Impairment: Functional complex Safety/Judgment: Appears intact Sensation Sensation Light Touch: Impaired Detail Central sensation comments: BLE tingling and sensation deficits knees down Light Touch Impaired Details: Impaired RLE;Impaired LLE Coordination Gross Motor Movements are Fluid and Coordinated: No Fine Motor Movements are Fluid and Coordinated: No Coordination and Movement Description: generalized weakness, pain limiting, myoclonic jerking in hands improved Motor  Motor Motor: Ataxia Motor - Discharge Observations: Ataxia,  muscle weakness  Mobility Bed Mobility Bed Mobility: Sit to Supine;Supine to Sit Supine to Sit: Supervision/Verbal cueing Sit to Supine: Supervision/Verbal cueing Transfers Transfers: Sit to Stand;Stand Pivot Transfers;Stand to Sit Sit to Stand: Contact Guard/Touching assist Stand to Sit: Contact Guard/Touching assist Stand Pivot Transfers: Contact Guard/Touching assist Transfer (  Assistive device): Rolling walker Locomotion  Gait Ambulation: Yes Gait Assistance: Contact Guard/Touching assist Gait Distance (Feet): 150 Feet Assistive device: Rolling walker Gait Assistance Details: Verbal cues for gait pattern;Verbal cues for technique;Verbal cues for safe use of DME/AE Gait Gait Pattern: Impaired Gait Pattern: Decreased stride length Gait velocity: decreased Stairs / Additional Locomotion Stairs: Yes Stairs Assistance: Contact Guard/Touching assist Stair Management Technique: With walker Number of Stairs: 2 Height of Stairs: 4 Ramp: Contact Guard/touching assist Curb: Doctor, Hospital Mobility: Yes Wheelchair Assistance: Independent with Scientist, Research (life Sciences): Both upper extremities Wheelchair Parts Management: Supervision/cueing Distance: 300'  Trunk/Postural Assessment  Cervical Assessment Cervical Assessment: Within Functional Limits Thoracic Assessment Thoracic Assessment: Within Functional Limits Lumbar Assessment Lumbar Assessment: Within Functional Limits Postural Control Postural Control: Deficits on evaluation Righting Reactions: delayed  Balance Balance Balance Assessed: Yes Static Sitting Balance Static Sitting - Balance Support: Bilateral upper extremity supported Static Sitting - Level of Assistance: 6: Modified independent (Device/Increase time) Dynamic Sitting Balance Dynamic Sitting - Balance Support: Bilateral upper extremity supported Dynamic Sitting - Level of Assistance: 5: Stand by  assistance Static Standing Balance Static Standing - Balance Support: Bilateral upper extremity supported Static Standing - Level of Assistance: 5: Stand by assistance Dynamic Standing Balance Dynamic Standing - Balance Support: Bilateral upper extremity supported Dynamic Standing - Level of Assistance: 4: Min assist;5: Stand by assistance (CGA) Extremity Assessment  RUE Assessment RUE Assessment: Within Functional Limits LUE Assessment LUE Assessment: Within Functional Limits RLE Assessment RLE Assessment: Exceptions to [facility] General Strength Comments: Hip flexion 2+/5, Otherwise grossly 4/5 LLE Assessment LLE Assessment: Exceptions to [facility] General Strength Comments: Hip flexion 2+/5, Otherwise grossly 4/5   Elsie JAYSON Dawn, PT, DPT 06/02/2024, 3:56 PM

## 2024-06-02 NOTE — Plan of Care (Signed)
  Problem: RH Problem Solving Goal: LTG Patient will demonstrate problem solving for (SLP) Description: LTG:  Patient will demonstrate problem solving for basic/complex daily situations with cues  (SLP) Outcome: Completed/Met   Problem: RH Memory Goal: LTG Patient will demonstrate ability for day to day (SLP) Description: LTG:   Patient will demonstrate ability for day to day recall/carryover during cognitive/linguistic activities with assist  (SLP) Outcome: Completed/Met   Problem: RH Swallowing Goal: LTG Patient will consume least restrictive diet using compensatory strategies with assistance (SLP) Description: LTG:  Patient will consume least restrictive diet using compensatory strategies with assistance (SLP) Outcome: Completed/Met

## 2024-06-03 ENCOUNTER — Other Ambulatory Visit (HOSPITAL_COMMUNITY): Payer: Self-pay

## 2024-06-03 LAB — BASIC METABOLIC PANEL WITH GFR
Anion gap: 10 (ref 5–15)
BUN: 27 mg/dL — ABNORMAL HIGH (ref 8–23)
CO2: 25 mmol/L (ref 22–32)
Calcium: 8.9 mg/dL (ref 8.9–10.3)
Chloride: 99 mmol/L (ref 98–111)
Creatinine, Ser: 0.93 mg/dL (ref 0.61–1.24)
GFR, Estimated: >60 mL/min (ref >60)
Glucose, Bld: 116 mg/dL — ABNORMAL HIGH (ref 70–99)
Potassium: 5 mmol/L (ref 3.5–5.1)
Sodium: 134 mmol/L — ABNORMAL LOW (ref 135–145)

## 2024-06-03 NOTE — Progress Notes (Addendum)
 PROGRESS NOTE   Subjective/Complaints:    No events overnight.  Continues with hip pain overnight, but states it is well-controlled and he is able to sleep.  Vital stable, labs with resolution of high potassium.  Continuing to have continent bowel and bladder.  Feeling prepared for discharge.   ROS:  Denies Chest pain or SOB Positive for bilateral hip pain/spasms, bilateral foot sensory reduction, +left lower extremity swelling   Objective:   No results found.    Recent Labs    06/02/24 0457  WBC 4.9  HGB 12.8*  HCT 37.7*  PLT 66*    Recent Labs    06/02/24 0457 06/03/24 0505  NA 135 134*  K 5.3* 5.0  CL 101 99  CO2 23 25  GLUCOSE 112* 116*  BUN 30* 27*  CREATININE 0.97 0.93  CALCIUM 8.7* 8.9     Intake/Output Summary (Last 24 hours) at 06/03/2024 9072 Last data filed at 06/03/2024 0900 Gross per 24 hour  Intake 820 ml  Output --  Net 820 ml        Physical Exam: Vital Signs Blood pressure 133/86, pulse 86, temperature 97.7 F (36.5 C), temperature source Oral, resp. rate 19, height 5' 8 (1.727 m), weight 91 kg, SpO2 97%.   Constitutional: No distress . Vital signs reviewed.  Sitting up in wheelchair at bedside.  HEENT: NCAT, EOMI, oral membranes moist. Neck: supple, no JVD. Cardiovascular: RRR without murmur. No JVD    Respiratory/Chest:  Clear to auscultation bilaterally.  No rales, rhonchi, or wheezing. GI/Abdomen: BS + hypoactive, non-tender, nondistended Ext: no clubbing, cyanosis, +LLE edema--stbale Psych: Awake, alert, appropriate.  Jovial.  Skin: C/D/I. No apparent lesions. + Craniotomy-staples removed, well-approximated + PIV c/d/i  MSK:      No apparent deformity.   Cognition: AAO to person, place, time + decreased concentration/awareness, appropriate with conversations but does not carryover well. + tangential   Insight: Fair insight Sensation:  reduced in bilateral  lower extremities CN: L eye difficulty converging, otherwise intact--no change Coordination:  Bilateral upper extremity loss of tone in hands with intention --no longer present on exam Spasticity: MAS 0 in all extremities.          Strength: Unchanged                LUE: 5/5 SA, 5/5 EF, 5/5 EE, 5/5 WE, 5/5 FF, 5/5 FA                RUE:  5/5 SA, 5/5 EF, 5/5 EE, 5/5 WE, 5/5 FF, 5/5 FA                RLE: 3-/5 HF, 3/5 KE, 4-/5  DF, 4-/5 PF                LLE: 3/5 HF, 3/5 KE, 3/5  DF, 3/5 PF   Physical exam unchanged from the above on reexamination 06/03/24    Assessment/Plan: 1. Functional deficits which require 3+ hours per day of interdisciplinary therapy in a comprehensive inpatient rehab setting. Physiatrist is providing close team supervision and 24 hour management of active medical problems listed below. Physiatrist and rehab team continue to assess barriers  to discharge/monitor patient progress toward functional and medical goals  Care Tool:  Bathing    Body parts bathed by patient: Right arm, Left arm, Abdomen, Chest, Front perineal area, Right upper leg, Left upper leg, Right lower leg, Left lower leg, Face, Buttocks   Body parts bathed by helper: Buttocks     Bathing assist Assist Level: Contact Guard/Touching assist     Upper Body Dressing/Undressing Upper body dressing   What is the patient wearing?: Pull over shirt    Upper body assist Assist Level: Supervision/Verbal cueing    Lower Body Dressing/Undressing Lower body dressing      What is the patient wearing?: Underwear/pull up, Pants     Lower body assist Assist for lower body dressing: Supervision/Verbal cueing     Toileting Toileting    Toileting assist Assist for toileting: Contact Guard/Touching assist     Transfers Chair/bed transfer  Transfers assist     Chair/bed transfer assist level: Supervision/Verbal cueing     Locomotion Ambulation   Ambulation assist      Assist level:  Contact Guard/Touching assist Assistive device: Walker-rolling Max distance: 150'   Walk 10 feet activity   Assist     Assist level: Contact Guard/Touching assist Assistive device: Walker-rolling   Walk 50 feet activity   Assist Walk 50 feet with 2 turns activity did not occur: Safety/medical concerns  Assist level: Contact Guard/Touching assist Assistive device: Walker-rolling    Walk 150 feet activity   Assist Walk 150 feet activity did not occur: Safety/medical concerns  Assist level: Contact Guard/Touching assist Assistive device: Walker-rolling    Walk 10 feet on uneven surface  activity   Assist Walk 10 feet on uneven surfaces activity did not occur: Safety/medical concerns   Assist level: Contact Guard/Touching assist     Wheelchair     Assist Is the patient using a wheelchair?: Yes Type of Wheelchair: Manual    Wheelchair assist level: Independent Max wheelchair distance: 300'    Wheelchair 50 feet with 2 turns activity    Assist        Assist Level: Independent   Wheelchair 150 feet activity     Assist      Assist Level: Independent   Blood pressure 133/86, pulse 86, temperature 97.7 F (36.5 C), temperature source Oral, resp. rate 19, height 5' 8 (1.727 m), weight 91 kg, SpO2 97%.  Medical Problem List and Plan: 1. 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 Daughter will be moving in with them. Myoclonus in [place] remains challenging; Min A ADLs overall min-mod for transfers. Amb 15 ft with RW limited by pain. Mild-moderate cognitive deficits variable depending on XRT.   - 10-31:  More confused today, hypotensive, leukocytosis of 15 concerning for infection.  Most likely cause is urinary given retention, but will also check chest x-ray given cough.  IV fluids for AKI.  Notable that patient started radiation therapy for his brain on 10-29  -11/2 [facility] for left foot  - 11/4: Fluctuating participation with myoclonus/jerking; improved this week.  Goals are supervision for PT. UBD SPV, Min A with reacher LB. Jerking and cognition improving.    - Messaged Dr. Dewey with radiation oncology per patient request-PA was kind enough to talk to him yesterday, addressed his current treatments and monitoring moving forward.  Very much appreciate her taking the time to do this!  Discussed lifestyle strategies for cancer treatment  The patient is medically ready for discharge to home and will need follow-up with [facility] PM&R. In addition, they will need to follow up with their PCP, Neurosurgery, Oncology, and Pulmonology.    2.  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 Discussed risks and benefits of treatment  11/8; Duplex with LLE  age indeterminate deep vein thrombosis involving the left femoral vein, left posterior tibial veins, and left peroneal veins; started on treatment dose Eliquis  3. Pain Management/spasms: percocet prn. Discussed [facility] outpatient and he was interested in this for his bilateral lower extremity neuropathy  - 10-24: Extreme difficulty tolerating therapies due to back and lower hip pain and spasms with mobility.  baclofen to 10 mg 3 times daily, add tizanidine 2 mg 3 times daily, schedule OxyContin 10 mg every 12 hours, continue as needed Percocet 5 mg every 4 hours.  - 10-25: Increased OxyContin to 15 mg twice daily, increase baclofen to 15 mg 3 times daily; immediately notified next therapy session the patient was getting lethargic with these changes, so reverted to prior  - 10-26: Patient pain much improved on current regimen.   Unfortunately, appears to be somewhat sedated and euphoric on OxyContin 10 mg twice daily.  Not using as needed oxycodone, will instead schedule 5 mg 2 times daily before therapies in addition to as needed and DC long-acting.   - 10/27: perhaps a little more pain today but cognitively seemed more appropriate compared to what's recorded- no changes to regimen  10-28: Pain under better control, continues with spasms but seems to be doing better overall.  10-30: Add Elavil 25 mg nightly for peripheral nerve pain at nighttime; already optimized on duloxetine, Lyrica  - 10-31: DC Elavil; did work well for pain, once causes of encephalopathy treated as below May retrial.  Reduce baclofen to 5 mg 3 times daily due to AKI.  -11/1-2 Reports continued back pain, overall under control with oxycodone--tolerating current regimen, continue  - 11-5: Increase as needed oxycodone from 5 to 5 to 10 mg--well-controlled on current regimen  4. Mood/Behavior/Sleep: LCSW to follow for evaluation and support             --continue ambien prn.              -antipsychotic agents: N/A  - 10-29: Developing some confusion overnight.  Schedule Ambien as he is using it nightly, 5 mg.  Add as needed trazodone 50 mg nightly.  - 10-30: No reports of confusion overnight.  Dors is sleeping well.  - 10-31: Grossly encephalopathic today, workup reveals leukocytosis of 15, AKI 1.69, and positive UA.  Delirium precautions, treatment as below.   -11/2 DC ambien per patient request, bad dreams reported  - 11-4: Sleeping well at night, no current changes warranted.   - 11/6: not using trazodone  5. Neuropsych/cognition: This patient may be intermittently  capable of making decisions on his own behalf. 6. New skin tear to left thumb: monitor daily  7. Fluids/Electrolytes/Nutrition: Monitor I/O.   8.  Hyperglycemia due to steroids for lung cancer w/brain mets: XRT started today and  at end of day as able to help with tolerance of therapy              --on  Decadron 4 mg TID and fasting BS 120-150 range.  --Will check A1c in am--5.7, prediabetic  fair control overall, continue to monitor  No results for input(s): GLUCAP in the last 72 hours.    9. COPD: On Singulair with albuterol prn.  10. HTN: Monitor BP TID---on avapro and cardura.   - Monitor with therapies today  - 10-25: Increase Cardura for urinary retention to 3 mg nightly  - 10-26: Blood pressures trending low; reduce Avapro 300 to 150 mg daily.  May need to reduce Cardura again if BP does not tolerate.  10/27 hold Avapro--BP improved 10-28--monitor on current regimen  10-31: Hypotension likely secondary to SIRS, give 1 L IV fluid bolus today, may need more fluids through the weekend.  Hold Cardura until BP improves.  - well controlled, continue current    06/03/2024    4:51 AM 06/02/2024    9:05 PM 06/02/2024    2:23 PM  Vitals with BMI  Systolic 133 104 877  Diastolic 86 60 75  Pulse 86 98 96    11. CAD: continue Avapro and statin. Monitor for symptoms with increase in activity.              --continue to hold spironolactone and torsemide.    - weights stable Filed Weights   05/15/24 1727 05/16/24 1544 05/17/24 0504  Weight: 91.1 kg 91.1 kg 91 kg    12.  Hyponatremia: Na 132 @ admission-->131.  --Recheck BMET in am--stable 10/27 stable at 132 10-31: 131 today, likely secondary to AKI as below.  11-1 stable 134  13. Acute on chronic renal failure: Encourage fluid intake.  BUN/SCr 27/1.32-->61/1.38 -- Monitor potassium levels as baseline 4.9-5.0-->continue ARB for now.  - Recheck CMET in am--improving BUN 37, creatinine 1.14, potassium 4.5 -10/27 BUN/Cr back up to 62/1.6 today, k+ 5.1 - hold avapro for now, push fluids and recheck in AM 10-28: BUN down to 47, creatinine normalized 1.09.  Continue to encourage p.o. fluids. 10-31: Recurrent AKI today, creatinine 1.69.  NA 131, potassium 5.2.  Give 1 L IV fluids, reduce baclofen as above, repeat BMP  tonight, depending on results may give additional 1 L.  Repeat BMP in AM. 11/1 BUN and Cr improved to 52/1.16--> back to baseline, creatinine 0.9 11/6: BUN up to 55 - give I L IVF, push POs, repeat in AM 11/10: Mild K increase, other vitals stable. Give lokelma 5 g once. Repeat in AM.  14.  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  - Baclofen 10 mg 3 times daily  - Tizanidine 2 mg 3 times daily  - 10-25: Start dantrolene 25 mg daily, after 1 week test LFTs and increase to 3 times daily if tolerated.  Add clonazepam 0.5 mg 3 times daily as needed for myoclonus.  May need to add VPA if myoclonus worsens.  10-26/27: Spasms much better controlled, has not needed to use clonazepam yet.  Trend.  10-28: Start low-dose Keppra 500 mg twice daily for upper extremity myoclonus--notable improvement 10/29!  10/31: More myoclonus noted today, complicated by AKI and likely UTI as below.  Treat underlying causes, if continues to be persistent would recommend EEG over the weekend.  -11/2 Not having any today, continue to monitor. If it reoccurs pt advised to let nursing know, can consider EEG   -  11-3/4: Reports no further myoclonus for the last 2 days.  Continue Keppra, if recurs will get EEG.   - resolved  15.  Neurogenic bladder/urinary retention.  Likely secondary to cauda equina involvement, flaccid bladder.  - Ongoing requirement of intermittent straight cathing; increase Cardura to 3 mg nightly  - 10-26: Added Urecholine 10 mg 3 times daily--if no response with up titration/intolerable side effects, would transition towards patient learning intermittent straight cathing   -10/27 cathed for 800cc this am.    -urecholine just added, consider increase in AM to 25mg  if still retaining   -encouraged up to [facility] or toilet to empty  - 10-28: Increase frequency of cath to every 4 hours due to large volumes, increase Urecholine to 25 mg 3 times daily.  If no improvement with  these measures, will need to drain patient on straight cathing versus place Foley based on upper extremity coordination and bladder volumes greater than 2.5 L/day.  - 10-29: Straight caths volumes better.  Continue Urecholine through today, renal ultrasound for structural cause assessment.  If abdominal cramping from Urecholine intolerable, will DC and discuss placement of Foley catheter  -10-30: Long discussion with patient, his wife regarding likely flaccid bladder secondary to cauda equina involvement.  Patient not agreeable to learning straight cathing at this time, wants Foley placed on discharge; will continue to discuss this with them  10-31: UTI associated with urinary retention, DC bethanechol.  Reducing Cardura due to hypotension as above.  Called patient's wife to discuss Foley placement given IV fluids and already currently needing every 4 hour caths to avoid high volumes, went to voicemail, will need her consent before placement.  11/1 foley was started, continue Rocephin  11/2 Continue Rocephin, pansensitive ecoli--will reattempt DC Foley 1 week after placement if still at rehab.  Will need urology consult as outpatient  11-4: Vital stable, switch Rocephin to p.o. Keflex for 3 days.  11-6: Patient agreeable to DC Foley trial starting tomorrow, for 48 hours .  Resume Flomax 0.4 mg today.- -Start DC Foley trial 11-7--if no continent voids within 48 hours, would replace Foley catheter 11-10: PVRs low, continent voids through the weekend!  16.  Constipation/diarrhea.  - 10-29: Liquid bowel movements yesterday after sorbitol.  Move MiraLAX to as needed, reduce Senokot S to nightly.  Monitor today due to possibility of abdominal cramping with Urecholine.  - 10-30: Patient endorsing constipation, will increase Senokot S2 1 tab twice daily and MiraLAX daily  10-31: No bowel movement, will give sorbitol 1 back tonight.  11/2 Sorbitol 30ml ordered--not given  continue MiraLAX twice daily  Last  bowel movement 11/9, small/smear; BM 11-8 large.  Move Senokot-S to nightly only.  17.  Leukocytosis, likely secondary to urinary tract infection  - Start ceftriaxone 2 g daily; follow cultures  - DC bethanechol as above  - Will discuss with wife Foley placement given already need for frequent caths, IV fluid as above  - Pending results of checks x-ray given cough to look for alternate causes  -11/1 WBC back to 9.6  11-3: WBC normalized.  18. Aspiration pneumonia  continue rocephin, SLP  -Afebrile  - 11/3: No concern for bedsde swallow   19. LLE swelling: vas US  ordered, see #2  20. Left sided facial tingling: discussed this is intermittent in nature, not greatly bothersome to him, discussed possible cranial nerve 5 irritation, vitamin D supplemented added as can help   LOS: 19 days A FACE TO FACE EVALUATION WAS PERFORMED  Joesph  JAYSON Likes 06/03/2024, 9:27 AM

## 2024-06-03 NOTE — Progress Notes (Signed)
 Patient ID: Roger Black, male   DOB: 07-16-1956, 68 y.o.   MRN: [id number]  SW met with pt and pt wife in room to review DME of RW and how this item can be ordered as not covered under insurance and he can purchase item from a local store.   HHPT/OT/aide referral accepted by [facility].   Graeme Jude, MSW, LCSW Office: [phone] Cell: [phone] Fax: [phone]

## 2024-06-03 NOTE — Progress Notes (Signed)
 Inpatient Rehabilitation Discharge Medication Review by a Pharmacist  A complete drug regimen review was completed for this patient to identify any potential clinically significant medication issues.  High Risk Drug Classes Is patient taking? Indication by Medication  Antipsychotic No   Anticoagulant Yes Apixaban - DVT, PE  Antibiotic No   Opioid Yes Oxycodone prn pain  Antiplatelet No   Hypoglycemics/insulin No   Vasoactive Medication Yes Tamsulosin - BPH  Chemotherapy No   Other Yes Symbicort, albuterol, ipratropium, montelukast- COPD  Atorvastatin - HLD Dexamethasone - brain tumor Duloxetine - mood Pantoprazole - reflux Pregabalin - pain Baclofen, tizanidine - muscle spasms Clonazepam - anxiety Levetiracetam - seizures Trazodone - prn sleep Naloxone - overdose     Type of Medication Issue Identified Description of Issue Recommendation(s)  Drug Interaction(s) (clinically significant)     Duplicate Therapy     Allergy     No Medication Administration End Date     Incorrect Dose     Additional Drug Therapy Needed     Significant med changes from prior encounter (inform family/care partners about these prior to discharge).    Other       Clinically significant medication issues were identified that warrant physician communication and completion of prescribed/recommended actions by midnight of the next day:  No  Name of provider notified for urgent issues identified:   Provider Method of Notification:     Pharmacist comments: None  Time spent performing this drug regimen review (minutes):  20 minutes  Thank you. Olam Monte, PharmD

## 2024-06-03 NOTE — Progress Notes (Signed)
 Inpatient Rehabilitation Care Coordinator Discharge Note   Patient Details  Name: Roger Black MRN: [id number] Date of Birth: 09-22-1955   Discharge location: D/c to home  Length of Stay: 18 days  Discharge activity level: CGA  Home/community participation: Limited  Patient response un:Yzjouy Literacy - How often do you need to have someone help you when you read instructions, pamphlets, or other written material from your doctor or pharmacy?: Rarely  Patient response un:Dnrpjo Isolation - How often do you feel lonely or isolated from those around you?: Rarely  Services provided included: MD, RD, PT, OT, RN, CM, TR, Pharmacy, Neuropsych, SW  Financial Services:  Field Seismologist Utilized: Private Insurance [employer]  Choices offered to/list presented to: patient and wife  Follow-up services arranged:  DME, Home Health Home Health Agency: [facility] for HHPT/OT/aide    DME : [facility] for hospital bed, 3in1 BSC and w/c.    Patient response to transportation need: Is the patient able to respond to transportation needs?: Yes In the past 12 months, has lack of transportation kept you from medical appointments or from getting medications?: No In the past 12 months, has lack of transportation kept you from meetings, work, or from getting things needed for daily living?: No   Patient/Family verbalized understanding of follow-up arrangements:  Yes  Individual responsible for coordination of the follow-up plan: contact pt  Confirmed correct DME delivered: Graeme DELENA Jude 06/03/2024    Comments (or additional information):fam edu completed  Summary of Stay    Date/Time Discharge Planning CSW  05/27/24 0941 Pt will d/c to home with his wife. Pt will have transportation through duration of radiation treatment while here; ends 11/5. Fam edu on Wedn 8am-11am with wife and DIL. SW will confirm there are no barriers to discharge. AAC  05/19/24 1232 Pt  will d/c to home with his wife. Pt will have transportation through duration of radiation treatment while here; ends 115. SW will confirm there are no barriers to discharge. AAC       Yurianna Tusing A Jude

## 2024-06-03 NOTE — Discharge Summary (Signed)
 Physician Discharge Summary  Patient ID: Roger Black MRN: [id number] DOB/AGE: 1955-09-02 68 y.o.  Admit date: 05/15/2024 Discharge date: 06/03/2024  Discharge Diagnoses:  Principal Problem:   Metastatic cancer to brain [facility]) Active Problems:   COPD (chronic obstructive pulmonary disease) (HCC)   Insomnia  E coli UTI  Aspiration Pneumonia   Muscle spasms  Neuropathy  Thrombocytopenia   Acute on chronic renal failure  Myoclonus   Hyponatremia  Constipation.      Discharged Condition: stable  Significant Diagnostic Studies: VAS US  LOWER EXTREMITY VENOUS (DVT) Result Date: 06/01/2024  Lower Venous DVT Study Patient Name:  Roger Black [facility]  Date of Exam:   05/31/2024 Medical Rec #: [id number]             Accession #:    [id number] Date of Birth: 1956/06/16             Patient Gender: M Patient Age:   29 years Exam Location:  [facility] Procedure:      VAS US  LOWER EXTREMITY VENOUS (DVT) Referring Phys: SVEN ELKS --------------------------------------------------------------------------------  Indications: Swelling.  Comparison Study: Prior negative bilateral LEV done 02/13/24 Performing Technologist: Alberta Lis RVS  Examination Guidelines: A complete evaluation includes B-mode imaging, spectral Doppler, color Doppler, and power Doppler as needed of all accessible portions of each vessel. Bilateral testing is considered an integral part of a complete examination. Limited examinations for reoccurring indications may be performed as noted. The reflux portion of the exam is performed with the patient in reverse Trendelenburg.  +-----+---------------+---------+-----------+----------+--------------+ RIGHTCompressibilityPhasicitySpontaneityPropertiesThrombus Aging +-----+---------------+---------+-----------+----------+--------------+ CFV  Full           Yes      Yes                                  +-----+---------------+---------+-----------+----------+--------------+ SFJ  Full                                                        +-----+---------------+---------+-----------+----------+--------------+   +---------+---------------+---------+-----------+----------+-----------------+ LEFT     CompressibilityPhasicitySpontaneityPropertiesThrombus Aging    +---------+---------------+---------+-----------+----------+-----------------+ CFV      Full           Yes      Yes                                    +---------+---------------+---------+-----------+----------+-----------------+ SFJ      Full                                                           +---------+---------------+---------+-----------+----------+-----------------+ FV Prox  Full           Yes      No                                     +---------+---------------+---------+-----------+----------+-----------------+ FV Mid   Partial        Yes      No  Age Indeterminate +---------+---------------+---------+-----------+----------+-----------------+ FV DistalPartial                                      Age Indeterminate +---------+---------------+---------+-----------+----------+-----------------+ PFV      Full           Yes      No                                     +---------+---------------+---------+-----------+----------+-----------------+ POP      Full           Yes      Yes                                    +---------+---------------+---------+-----------+----------+-----------------+ PTV      Partial                                      Age Indeterminate +---------+---------------+---------+-----------+----------+-----------------+ PERO     Partial                                      Age Indeterminate +---------+---------------+---------+-----------+----------+-----------------+ Gastroc  Full                                                            +---------+---------------+---------+-----------+----------+-----------------+     Summary: RIGHT: - No evidence of common femoral vein obstruction.   LEFT: - Findings consistent with age indeterminate deep vein thrombosis involving the left femoral vein, left posterior tibial veins, and left peroneal veins.  - No cystic structure found in the popliteal fossa.  *See table(s) above for measurements and observations. Electronically signed by Debby Robertson on 06/01/2024 at 2:12:59 PM.    Final    DG CHEST PORT 1 VIEW Result Date: 05/23/2024 CLINICAL DATA:  Cough EXAM: PORTABLE CHEST 1 VIEW COMPARISON:  CT chest dated 05/06/2024 FINDINGS: Normal lung volumes. Bibasilar hazy and patchy opacities, right-greater-than-left. Bilateral upper lung lucency. No pleural effusion or pneumothorax. The heart size and mediastinal contours are within normal limits. No acute osseous abnormality. IMPRESSION: 1. Bibasilar hazy and patchy opacities, right-greater-than-left, suspicious for aspiration or pneumonia. 2. Bilateral upper lung lucency, likely related to emphysema. Electronically Signed   By: Limin  Xu M.D.   On: 05/23/2024 17:38   US  RENAL Result Date: 05/21/2024 CLINICAL DATA:  Urinary retention EXAM: RENAL / URINARY TRACT ULTRASOUND COMPLETE COMPARISON:  CT 05/06/2024 FINDINGS: Right Kidney: Renal measurements: 10.3 x 4.3 x 4 cm = volume: 94 mL. Echogenicity within normal limits. No mass or hydronephrosis visualized. Left Kidney: Renal measurements: 10 x 4.5 x 3.6 cm = volume: 83 mL. Echogenicity within normal limits. No mass or hydronephrosis visualized. Bladder: Distended urinary bladder. Other: None. IMPRESSION: Negative renal ultrasound.  Distended urinary bladder. Electronically Signed   By: Luke Bun M.D.   On: 05/21/2024 21:22   MR BRAIN W WO CONTRAST Result Date: 05/18/2024 EXAM: MRI BRAIN WITH  AND WITHOUT CONTRAST 05/18/2024 04:45:00 PM TECHNIQUE: Multiplanar multisequence MRI of the head/brain  was performed with and without the administration of intravenous contrast. COMPARISON: MRI May 05, 2024 CLINICAL HISTORY: Metastatic disease evaluation; 3T SRS Protocol for radiation treatment planning. FINDINGS: BRAIN AND VENTRICLES: No acute infarct. No acute intracranial hemorrhage. No mass effect or midline shift. No hydrocephalus. Similar left frontal lobe T2 hyperintensity, likely gliosis. Normal flow voids. No substantial change in approximately 15 x 12 mm enhancing lesion in the right frontal lobe and 17 mm lesion in the left cerebellum. No new enhancing lesions identified. Axial postcontrast imaging is limited by motion. The surrounding T2 FLAIR hyperintense edema is improved. ORBITS: No acute abnormality. SINUSES: No acute abnormality. BONES AND SOFT TISSUES: Normal bone marrow signal and enhancement. No acute soft tissue abnormality. IMPRESSION: 1. 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 Improved surrounding edema. 2. No new enhancing lesions identified. Electronically signed by: Gilmore Molt MD 05/18/2024 11:27 PM EDT RP Workstation: [id number]     Labs:  Basic Metabolic Panel: Recent Labs  Lab 05/29/24 0446 06/02/24 0457 06/03/24 0505  NA 135 135 134*  K 4.8 5.3* 5.0  CL 103 101 99  CO2 22 23 25   GLUCOSE 131* 112* 116*  BUN 55* 30* 27*  CREATININE 1.09 0.97 0.93  CALCIUM 8.4* 8.7* 8.9    CBC:    Latest Ref Rng & Units 06/02/2024    4:57 AM 05/29/2024    4:46 AM 05/26/2024    4:58 AM  CBC  WBC 4.0 - 10.5 K/uL 4.9  7.2  6.3   Hemoglobin 13.0 - 17.0 g/dL 87.1  87.0  86.7   Hematocrit 39.0 - 52.0 % 37.7  38.4  40.1   Platelets 150 - 400 K/uL 66  78  84      CBG: No results for input(s): GLUCAP in the last 168 hours.  Brief HPI:   Roger Black is a 68 y.o. male with history of hypertension, CHF, COPD, CKD, OSA, left upper lobe bronchogenic cancer s/p XRT, left shoulder pain and onset of LLE pain with workup  revealing multilevel stenosis with enhancement of nerve roots.  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  MRI brain done revealing enhancing lesion right frontal lobe and left cerebellar hemisphere concerning for metastatic disease associated with vasogenic edema.  Dr. [name] was consulted and patient underwent right craniotomy for resection of brain tumor on 10/16.    Postop completed 7-day course of Keppra for seizure prophylaxis.  Nuclear med bone scan done was negative for metastatic disease.  He was started on Lyrica for back and leg pain and muscle spasms treated with baclofen.  Radiation oncology consulted with plans to start palliative radiotherapy with 3 fractions of SRS XRT.  He was noted to have hyponatremia with Na-131 and acute renal failure with BUN/SCr 61/1.38.  Patient was independent and working part-time prior to admission.  Therapy was working with patient was requiring min to mod assist overall.  CIR was recommended due to functional decline.     Hospital Course: Roger Black was admitted to rehab 05/15/2024 for inpatient therapies to consist of PT , ST and OT at least three hours five days a week. Past admission physiatrist, therapy team and rehab RN have worked together to provide customized collaborative inpatient rehab. His blood pressures  were monitored on TID basis and has been stable off medications. He was limited by myoclonus BUE with jerks as well as BLE spasms felt to be due to CNS metastasis. Baclofen and titrated up in addition to tizanidine with some improvement however due to ongoing myoclonus Keppra was added with notable improvement and as needed clonazepam on board for use additionally. Duloxetine added and titrated up in addition to pregabalin to help manage pain and neuropathy in BLE. Pain has been a limiting  factor and oxycodone used on prn basis. He completed XRT to spin eon 11/05 and was started on decadron taper 11/7.  Urinary retention noted likely due to cauda equina involvement and he required I/O caths every 4 hours due to large volumes.  He did have increase in confusion with leucocytosis, AKI and hypotension on 10/31 and treated with IVF. Renal ultrasound done for work up and showed distended bladder and normal echogenicity without hydronephrosis. Cardura and urecholine d/c and foley replaced for bladder rest.  UA/UCS done revealing E coli UTI as well as suspicion of aspiration pneumonia which was treated rocephin and transitioned to Keflex for 1 week course antibiotic regimen.  Bedside swallow showed no signs of aspiration and he was educated on safe swallow strategies. Voiding trial repeated on 11/06, flomax resumed and he is now voiding without difficulty.  Bowel program has been adjusted to manage constipation.  He has also had issues with insomnia which has been managed with prn use of trazodone as bad dreams reported with trial of ambien.   Respiratory status has been stable on current regimen and Symbicort resumed at discharge.  He developed LLE edema 11/08  and BLE dopplers done revealing age-indeterminate DVT in left femoral vein, left posterior tib and left peroneal veins.  He was started on Eliquis for treatment of DVT and recommended at least 35-month course anticoagulation. Follow-up CBC shows mild drop in H&H and platelets to have recurrent trend downwards.  Recommend recheck CBC in a week to monitor for stability.  Renal status has been monitored with routine checks and prerenal azotemia is resolving.  Transient hyperkalemia resolved with dose of Lokelma and patient advised to limit high potassium foods.  Hyponatremia has improved with sodium at 134 at discharge.  XRT to spine completed as well as SRS to brain with recommendations for further workup with PET scan as well as MRI brain every 3  months.  He is to follow-up with Dr. Melanee and Dr. Etta in [place] for follow-up and further input.  He has had improvement in pain as well as myoclonic jerks but continues to have BLE instability due to tendency of knees to buckle.  Contact-guard assist is recommended for safety.  He will continue receive follow-up home health PT, OT and aide by [facility] after discharge   Rehab course: During patient's stay in rehab weekly team conferences were held to monitor patient's progress, set goals and discuss barriers to discharge. At admission, patient required mod assist with mobility and with ADL tasks. He was found to have mild to moderate cognitive deficits with verbosity. He has had improvement in activity tolerance, balance, postural control as well as ability to compensate for deficits. He has had improvement in functional use RLE and LLE as well as improvement in awareness.  He is able to complete ADL tasks with contact-guard assist. Left gaze diplopia manage with partially occluded glasses.  He requires contact-guard assist for transfers and to ambulate 150 feet with use of rolling walker.  He requires min assist with cognitive tasks and supervision for utilization of safe swallow strategies.   Discharge disposition: 06-Home-Health Care Svc  Diet: Regular  Special Instructions: Recommend CBC in 5-7 days to monitor H/H and platelets as Recommend repeat BMET in 5-7 days to monitor renal status and potassium level.   Discharge Instructions     Ambulatory referral to Physical Medicine Rehab   Complete by: As directed    Hospital follow up      Allergies as of 06/03/2024   No Known Allergies      Medication List     STOP taking these medications    aspirin EC 81 MG tablet   doxazosin 2 MG tablet Commonly known as: CARDURA   methocarbamol 500 MG tablet Commonly known as: ROBAXIN   olmesartan 40 MG tablet Commonly known as: BENICAR   ondansetron 4 MG/2ML Soln  injection Commonly known as: ZOFRAN   oxyCODONE-acetaminophen 5-325 MG tablet Commonly known as: PERCOCET/ROXICET   spironolactone 50 MG tablet Commonly known as: ALDACTONE   torsemide 100 MG tablet Commonly known as: DEMADEX   zolpidem 5 MG tablet Commonly known as: AMBIEN       TAKE these medications    Acetaminophen Extra Strength 500 MG Tabs Take 2 tablets (1,000 mg total) by mouth 3 (three) times daily. What changed:  medication strength how much to take when to take this reasons to take this   albuterol 108 (90 Base) MCG/ACT inhaler Commonly known as: VENTOLIN HFA Inhale 2 puffs into the lungs every 4 (four) hours as needed for shortness of breath.   atorvastatin 80 MG tablet Commonly known as: LIPITOR Take 80 mg by mouth daily.   Baclofen 5 MG Tabs Take 1 tablet (5 mg total) by mouth 3 (three) times daily.   budesonide-formoterol 160-4.5 MCG/ACT inhaler Commonly known as: SYMBICORT Inhale 2 puffs into the lungs 2 (two) times daily.   clonazePAM 0.5 MG tablet--Rx# 30 pills Commonly known as: KLONOPIN Take 1 tablet (0.5 mg total) by mouth 3 (three) times daily as needed (myoclonus / spasms).   dexamethasone 2 MG tablet Commonly known as: DECADRON Take two tablets by mouth twice a day through 11/13. Then decrease to one tablet twice a day from 11/14-11/20. Then decrease to one daily from11/21 to 11/27. Then decrease to one tablet every other day from 11/28-12/04. What changed:  medication strength how much to take how to take this when to take this additional instructions   DULoxetine 60 MG capsule Commonly known as: CYMBALTA Take 1 capsule (60 mg total) by mouth daily.   Eliquis 5 MG Tabs tablet Generic drug: apixaban Take two pills twice a day through 11/15. Starting 11/16 am, decrease to one pill twice a day.   ipratropium 0.03 % nasal spray Commonly known as: ATROVENT Place into the nose.   levETIRAcetam 500 MG tablet Commonly known as:  KEPPRA Take 1 tablet (500 mg total) by mouth 2 (two) times daily.   montelukast 10 MG tablet Commonly known as: SINGULAIR Take 1 tablet (10 mg total) by mouth daily.   naloxone 4 MG/0.1ML Liqd nasal spray kit Commonly known as: NARCAN Use in case of overdose   Oxycodone HCl 10 MG Tabs--Rx # 30 pills Take 1 tablet (10 mg total) by mouth every 4 (four) hours as needed for severe pain (pain score 7-10).   pantoprazole 40 MG tablet Commonly known as: PROTONIX Take 1 tablet (40 mg total) by mouth daily.   polyethylene glycol powder 17  GM/SCOOP powder Commonly known as: GLYCOLAX/MIRALAX Take 17 g by mouth 2 (two) times daily. Dissolve 1 capful (17g) in 4-8 ounces of liquid and take by mouth daily.   pregabalin 150 MG capsule Commonly known as: LYRICA Take 1 capsule (150 mg total) by mouth 3 (three) times daily.   Stool Softener/Laxative 50-8.6 MG tablet Generic drug: senna-docusate Take 1 tablet by mouth at bedtime.   tamsulosin 0.4 MG Caps capsule Commonly known as: FLOMAX Take 1 capsule (0.4 mg total) by mouth daily after supper.   tiZANidine 2 MG tablet Commonly known as: ZANAFLEX Take 1 tablet (2 mg total) by mouth 3 (three) times daily.   traZODone 50 MG tablet Commonly known as: DESYREL Take 1 tablet (50 mg total) by mouth at bedtime as needed for sleep.   vitamin D3 25 MCG tablet Commonly known as: CHOLECALCIFEROL Take 1 tablet (1,000 Units total) by mouth daily.        Follow-up Information     [name], MD Follow up.   Specialty: Internal Medicine Why: Call in 1-2 days for post hospital follow up Contact information: [address] [facility] [name] [address] [phone]         Parris Manna, MD Follow up.   Specialty: Pulmonary Disease Why: Call in 1-2 days for post hospital follow up Contact information: [address] [name] [address] [phone]         Emeline Search C, DO Follow up.    Specialty: Physical Medicine and Rehabilitation Why: office will call you with follow up appointment Contact information: [address] [name] [address] [phone]         [name] Aran, MD Follow up.   Specialty: Radiation Oncology Contact information: [address]   [address]   [name] [address] [phone]         [name] Dino HERO, MD Follow up on 06/10/2024.   Specialty: Neurosurgery Why: Appointment at 9:20 am Contact information: [address] [place] [address] [name] [address] [phone]         Melanee Annah BROCKS, MD Follow up.   Specialty: Oncology Why: Call in 1-2 days for post hospital follow up Contact information: [address] [name] [address] [phone]                 Signed: Sharlet GORMAN Schmitz 06/04/2024, 5:49 PM

## 2024-06-03 NOTE — Progress Notes (Signed)
 Per patient his bowel and bladder is doing good. Surgical incision CDI. Supplies and education on wound care given for left finger skin tear.

## 2024-06-04 DIAGNOSIS — G4700 Insomnia, unspecified: Secondary | ICD-10-CM | POA: Insufficient documentation

## 2024-06-05 ENCOUNTER — Telehealth: Payer: Self-pay

## 2024-06-05 DIAGNOSIS — J309 Allergic rhinitis, unspecified: Secondary | ICD-10-CM | POA: Insufficient documentation

## 2024-06-05 DIAGNOSIS — M1990 Unspecified osteoarthritis, unspecified site: Secondary | ICD-10-CM | POA: Insufficient documentation

## 2024-06-05 DIAGNOSIS — R7303 Prediabetes: Secondary | ICD-10-CM | POA: Insufficient documentation

## 2024-06-05 DIAGNOSIS — G4733 Obstructive sleep apnea (adult) (pediatric): Secondary | ICD-10-CM | POA: Insufficient documentation

## 2024-06-05 DIAGNOSIS — E785 Hyperlipidemia, unspecified: Secondary | ICD-10-CM

## 2024-06-05 DIAGNOSIS — R3129 Other microscopic hematuria: Secondary | ICD-10-CM | POA: Insufficient documentation

## 2024-06-05 HISTORY — DX: Hyperlipidemia, unspecified: E78.5

## 2024-06-05 NOTE — Telephone Encounter (Signed)
 SW informed by PA that pt needs a CBC lab draw and request for lab to be drawn tomorrow. SW sent order to [facility]. Contact info for outpatient rehab clinic and PCP on order for where results need to be faxed.   Graeme Jude, MSW, LCSW Office: [phone] Cell: [phone] Fax: [phone]

## 2024-06-06 ENCOUNTER — Telehealth: Payer: Self-pay

## 2024-06-06 NOTE — Telephone Encounter (Signed)
 Clinical Social Work was referred by CHARITY FUNDRAISER for assessment of home care needs. CSW attempted to contact patient by phone.  Left voicemail with contact information and request for return call.

## 2024-06-09 ENCOUNTER — Inpatient Hospital Stay
Admission: EM | Admit: 2024-06-09 | Discharge: 2024-06-25 | DRG: 054 | Disposition: A | Discharge status: Hospice | Attending: Family Medicine | Admitting: Family Medicine

## 2024-06-09 ENCOUNTER — Encounter: Payer: Self-pay | Admitting: Oncology

## 2024-06-09 ENCOUNTER — Encounter: Payer: Self-pay | Admitting: Emergency Medicine

## 2024-06-09 ENCOUNTER — Other Ambulatory Visit: Payer: Self-pay

## 2024-06-09 ENCOUNTER — Emergency Department

## 2024-06-09 ENCOUNTER — Observation Stay

## 2024-06-09 DIAGNOSIS — J42 Unspecified chronic bronchitis: Secondary | ICD-10-CM

## 2024-06-09 DIAGNOSIS — J449 Chronic obstructive pulmonary disease, unspecified: Secondary | ICD-10-CM | POA: Diagnosis present

## 2024-06-09 DIAGNOSIS — M25551 Pain in right hip: Secondary | ICD-10-CM | POA: Diagnosis not present

## 2024-06-09 DIAGNOSIS — C3412 Malignant neoplasm of upper lobe, left bronchus or lung: Secondary | ICD-10-CM | POA: Diagnosis present

## 2024-06-09 DIAGNOSIS — E785 Hyperlipidemia, unspecified: Secondary | ICD-10-CM | POA: Diagnosis not present

## 2024-06-09 DIAGNOSIS — E876 Hypokalemia: Secondary | ICD-10-CM | POA: Diagnosis present

## 2024-06-09 DIAGNOSIS — I5022 Chronic systolic (congestive) heart failure: Secondary | ICD-10-CM | POA: Diagnosis present

## 2024-06-09 DIAGNOSIS — I10 Essential (primary) hypertension: Secondary | ICD-10-CM | POA: Diagnosis not present

## 2024-06-09 DIAGNOSIS — D696 Thrombocytopenia, unspecified: Secondary | ICD-10-CM | POA: Diagnosis not present

## 2024-06-09 DIAGNOSIS — N1831 Chronic kidney disease, stage 3a: Secondary | ICD-10-CM | POA: Diagnosis not present

## 2024-06-09 DIAGNOSIS — Z86718 Personal history of other venous thrombosis and embolism: Secondary | ICD-10-CM

## 2024-06-09 DIAGNOSIS — R338 Other retention of urine: Secondary | ICD-10-CM | POA: Diagnosis not present

## 2024-06-09 DIAGNOSIS — N3090 Cystitis, unspecified without hematuria: Secondary | ICD-10-CM | POA: Diagnosis not present

## 2024-06-09 DIAGNOSIS — S0990XA Unspecified injury of head, initial encounter: Secondary | ICD-10-CM | POA: Diagnosis not present

## 2024-06-09 DIAGNOSIS — R531 Weakness: Secondary | ICD-10-CM | POA: Diagnosis not present

## 2024-06-09 DIAGNOSIS — C7931 Secondary malignant neoplasm of brain: Secondary | ICD-10-CM | POA: Diagnosis present

## 2024-06-09 DIAGNOSIS — W19XXXA Unspecified fall, initial encounter: Secondary | ICD-10-CM | POA: Diagnosis not present

## 2024-06-09 DIAGNOSIS — Y92009 Unspecified place in unspecified non-institutional (private) residence as the place of occurrence of the external cause: Secondary | ICD-10-CM

## 2024-06-09 DIAGNOSIS — Z515 Encounter for palliative care: Secondary | ICD-10-CM

## 2024-06-09 DIAGNOSIS — E669 Obesity, unspecified: Secondary | ICD-10-CM | POA: Diagnosis not present

## 2024-06-09 DIAGNOSIS — G936 Cerebral edema: Secondary | ICD-10-CM | POA: Diagnosis not present

## 2024-06-09 DIAGNOSIS — L8990 Pressure ulcer of unspecified site, unspecified stage: Secondary | ICD-10-CM | POA: Insufficient documentation

## 2024-06-09 LAB — URINALYSIS, W/ REFLEX TO CULTURE (INFECTION SUSPECTED)
Bilirubin Urine: NEGATIVE
Glucose, UA: NEGATIVE mg/dL
Ketones, ur: NEGATIVE mg/dL
Leukocytes,Ua: NEGATIVE
Nitrite: POSITIVE — AB
Protein, ur: NEGATIVE mg/dL
Specific Gravity, Urine: 1.016 (ref 1.005–1.030)
pH: 7 (ref 5.0–8.0)

## 2024-06-09 LAB — CBC WITH DIFFERENTIAL/PLATELET
Abs Immature Granulocytes: 0.06 K/uL (ref 0.00–0.07)
Basophils Absolute: 0 K/uL (ref 0.0–0.1)
Basophils Relative: 0 %
Eosinophils Absolute: 0 K/uL (ref 0.0–0.5)
Eosinophils Relative: 1 %
HCT: 37.1 % — ABNORMAL LOW (ref 39.0–52.0)
Hemoglobin: 12.4 g/dL — ABNORMAL LOW (ref 13.0–17.0)
Immature Granulocytes: 2 %
Lymphocytes Relative: 6 %
Lymphs Abs: 0.2 K/uL — ABNORMAL LOW (ref 0.7–4.0)
MCH: 32.1 pg (ref 26.0–34.0)
MCHC: 33.4 g/dL (ref 30.0–36.0)
MCV: 96.1 fL (ref 80.0–100.0)
Monocytes Absolute: 0.2 K/uL (ref 0.1–1.0)
Monocytes Relative: 6 %
Neutro Abs: 3.2 K/uL (ref 1.7–7.7)
Neutrophils Relative %: 85 %
Platelets: 68 K/uL — ABNORMAL LOW (ref 150–400)
RBC: 3.86 MIL/uL — ABNORMAL LOW (ref 4.22–5.81)
RDW: 14.1 % (ref 11.5–15.5)
WBC: 3.8 K/uL — ABNORMAL LOW (ref 4.0–10.5)
nRBC: 0 % (ref 0.0–0.2)

## 2024-06-09 LAB — COMPREHENSIVE METABOLIC PANEL WITH GFR
ALT: 35 U/L (ref 0–44)
AST: 31 U/L (ref 15–41)
Albumin: 3.3 g/dL — ABNORMAL LOW (ref 3.5–5.0)
Alkaline Phosphatase: 60 U/L (ref 38–126)
Anion gap: 10 (ref 5–15)
BUN: 16 mg/dL (ref 8–23)
CO2: 28 mmol/L (ref 22–32)
Calcium: 9.2 mg/dL (ref 8.9–10.3)
Chloride: 98 mmol/L (ref 98–111)
Creatinine, Ser: 0.85 mg/dL (ref 0.61–1.24)
GFR, Estimated: >60 mL/min (ref >60)
Glucose, Bld: 114 mg/dL — ABNORMAL HIGH (ref 70–99)
Potassium: 3.4 mmol/L — ABNORMAL LOW (ref 3.5–5.1)
Sodium: 135 mmol/L (ref 135–145)
Total Bilirubin: 0.5 mg/dL (ref 0.0–1.2)
Total Protein: 5.5 g/dL — ABNORMAL LOW (ref 6.5–8.1)

## 2024-06-09 LAB — PRO BRAIN NATRIURETIC PEPTIDE: Pro Brain Natriuretic Peptide: 502 pg/mL — ABNORMAL HIGH (ref <300.0)

## 2024-06-09 LAB — PHOSPHORUS: Phosphorus: 2.5 mg/dL (ref 2.5–4.6)

## 2024-06-09 LAB — MAGNESIUM: Magnesium: 1.7 mg/dL (ref 1.7–2.4)

## 2024-06-09 MED ORDER — HYDRALAZINE HCL 20 MG/ML IJ SOLN: 5.0000 mg | INTRAMUSCULAR | Status: DC | PRN

## 2024-06-09 MED ORDER — DM-GUAIFENESIN ER 30-600 MG PO TB12: 1.0000 | ORAL_TABLET | Freq: Two times a day (BID) | ORAL | Status: DC | PRN

## 2024-06-09 MED ORDER — ALBUTEROL SULFATE (2.5 MG/3ML) 0.083% IN NEBU
2.5000 mg | INHALATION_SOLUTION | RESPIRATORY_TRACT | Status: DC | PRN
  Administered 2024-06-21: 2.5 mg via RESPIRATORY_TRACT
  Filled 2024-06-09: qty 3

## 2024-06-09 MED ORDER — SODIUM CHLORIDE 0.9 % IV BOLUS
1000.0000 mL | Freq: Once | INTRAVENOUS | Status: AC
  Administered 2024-06-09: 1000 mL via INTRAVENOUS

## 2024-06-09 MED ORDER — SODIUM CHLORIDE 0.9 % IV SOLN
2.0000 g | Freq: Once | INTRAVENOUS | Status: AC
  Administered 2024-06-09: 2 g via INTRAVENOUS
  Filled 2024-06-09: qty 20

## 2024-06-09 MED ORDER — ONDANSETRON HCL 4 MG/2ML IJ SOLN: 4.0000 mg | Freq: Three times a day (TID) | INTRAMUSCULAR | Status: DC | PRN

## 2024-06-09 MED ORDER — ACETAMINOPHEN 325 MG PO TABS: 650.0000 mg | ORAL_TABLET | Freq: Four times a day (QID) | ORAL | Status: DC | PRN

## 2024-06-09 MED ORDER — POTASSIUM CHLORIDE CRYS ER 20 MEQ PO TBCR
20.0000 meq | EXTENDED_RELEASE_TABLET | Freq: Once | ORAL | Status: AC
  Administered 2024-06-09: 20 meq via ORAL
  Filled 2024-06-09: qty 1

## 2024-06-09 NOTE — ED Notes (Signed)
 Patient transported to X-ray

## 2024-06-09 NOTE — ED Notes (Signed)
 Pt at CT

## 2024-06-09 NOTE — ED Provider Notes (Signed)
 [facility] Provider Note    Event Date/Time   First MD Initiated Contact with Patient 06/09/24 1806     (approximate)   History   Chief Complaint: Fall   HPI  Roger Black is a 68 y.o. male with a history of CKD, COPD, cholelithiasis, emphysema, hypertension, squamous cell carcinoma receiving radiation treatment who comes ED complaining of recurrent falls.  Was discharged from the hospital 6 days ago, discharged to home.  Since returning home, patient has continued to have functional decline, declining from completing his ADLs with a rolling walker to now being unable to get out of bed.  Today he fell at home.  Complains of pain all over.  Did not hit his head.  Denies neck pain.       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   right lateral hand, treating with 5FU/calcipotriene   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   vertex scalp, treating with 5FU/calcipotriene    Current Outpatient Rx   Order #: [id number] Class: Normal   Order #: [id number] Class: Historical Med   Order #: [id number] Class: Normal   Order #: [id number] Class: Historical Med   Order #: [id number] Class: Normal   Order #: [id number] Class: Historical Med   Order #: [id number] Class: Normal   Order #: [id number] Class: Normal   Order #: [id number] Class: Normal   Order #: [id number] Class: Normal   Order #: [id number] Class: Historical Med   Order #: [id number] Class: Normal    Order #: [id number] Class: Normal   Order #: [id number] Class: Normal   Order #: [id number] Class: Normal   Order #: [id number] Class: Normal   Order #: [id number] Class: Normal   Order #: [id number] Class: Normal   Order #: [id number] Class: Normal   Order #: [id number] Class: Normal   Order #: [id number] Class: Normal   Order #: [id number] Class: Normal    Past Surgical History:  Procedure Laterality Date   APPLICATION OF CRANIAL NAVIGATION Right 05/08/2024   Procedure: COMPUTER-ASSISTED NAVIGATION, FOR CRANIAL PROCEDURE;  Surgeon: [name] Dino HERO, MD;  Location: [facility] OR;  Service: Neurosurgery;  Laterality: Right;   COLON SURGERY     COLONOSCOPY WITH PROPOFOL  N/A 10/20/2020   Procedure: COLONOSCOPY WITH PROPOFOL ;  Surgeon: Toledo, Ladell POUR, MD;  Location: [facility] ENDOSCOPY;  Service: Gastroenterology;  Laterality: N/A;   CRANIOTOMY Right 05/08/2024   Procedure: CRANIOTOMY TUMOR EXCISION;  Surgeon: [name] Dino HERO, MD;  Location: [facility] OR;  Service: Neurosurgery;  Laterality: Right;    Physical Exam   Triage Vital Signs: ED Triage Vitals  Encounter Vitals Group     BP 06/09/24 1809 112/82     Girls Systolic BP Percentile --      Girls Diastolic BP Percentile --      Boys Systolic BP Percentile --      Boys Diastolic BP Percentile --      Pulse Rate 06/09/24 1807 100     Resp 06/09/24 1807 16  Temp 06/09/24 1810 98.9 F (37.2 C)     Temp Source 06/09/24 1810 Oral     SpO2 06/09/24 1807 98 %     Weight 06/09/24 1808 200 lb 9.9 oz (91 kg)     Height 06/09/24 1808 5' 8 (1.727 m)     Head Circumference --      Peak Flow --      Pain Score 06/09/24 1808 10     Pain Loc --      Pain Education --      Exclude from Growth Chart --     Most recent vital signs: Vitals:   06/09/24 2000 06/09/24 2100  BP:  133/84  Pulse:  89  Resp:  13  Temp: 98.5 F (36.9 C)   SpO2:  97%    General: Awake, no distress.  Chronically ill-appearing.  CV:  Good peripheral perfusion.  Regular rate  rhythm Resp:  Normal effort.  Clear lungs Abd:  No distention.  Soft, suprapubic tenderness Other:  No midline spinal tenderness.  Full range of motion all extremities.  There is tenderness at the right hip without limb shortening   ED Results / Procedures / Treatments   Labs (all labs ordered are listed, but only abnormal results are displayed) Labs Reviewed  COMPREHENSIVE METABOLIC PANEL WITH GFR - Abnormal; Notable for the following components:      Result Value   Potassium 3.4 (*)    Glucose, Bld 114 (*)    Total Protein 5.5 (*)    Albumin 3.3 (*)    All other components within normal limits  CBC WITH DIFFERENTIAL/PLATELET - Abnormal; Notable for the following components:   WBC 3.8 (*)    RBC 3.86 (*)    Hemoglobin 12.4 (*)    HCT 37.1 (*)    Platelets 68 (*)    Lymphs Abs 0.2 (*)    All other components within normal limits  URINALYSIS, W/ REFLEX TO CULTURE (INFECTION SUSPECTED) - Abnormal; Notable for the following components:   Color, Urine YELLOW (*)    APPearance CLOUDY (*)    Hgb urine dipstick SMALL (*)    Nitrite POSITIVE (*)    Bacteria, UA RARE (*)    All other components within normal limits  URINE CULTURE  PRO BRAIN NATRIURETIC PEPTIDE  MAGNESIUM  PHOSPHORUS     EKG Interpreted by me Sinus rhythm rate of 97.  Normal axis and intervals.  Poor R wave progression.  No acute ischemic changes.   RADIOLOGY X-ray right hip and pelvis interpreted by me, negative for fracture.  Radiology report reviewed   PROCEDURES:  Procedures   MEDICATIONS ORDERED IN ED: Medications  cefTRIAXone (ROCEPHIN) 2 g in sodium chloride  0.9 % 100 mL IVPB (has no administration in time range)  potassium chloride SA (KLOR-CON M) CR tablet 20 mEq (has no administration in time range)  albuterol (PROVENTIL) (2.5 MG/3ML) 0.083% nebulizer solution 2.5 mg (has no administration in time range)  dextromethorphan-guaiFENesin (MUCINEX DM) 30-600 MG per 12 hr tablet 1 tablet (has no  administration in time range)  ondansetron (ZOFRAN) injection 4 mg (has no administration in time range)  hydrALAZINE (APRESOLINE) injection 5 mg (has no administration in time range)  acetaminophen (TYLENOL) tablet 650 mg (has no administration in time range)  sodium chloride  0.9 % bolus 1,000 mL (1,000 mLs Intravenous New Bag/Given 06/09/24 1840)     IMPRESSION / MDM / ASSESSMENT AND PLAN / ED COURSE  I reviewed the triage vital signs  and the nursing notes.  DDx: AKI, electrolyte derangement, anemia, UTI, urinary retention, progressive CNS disease from cancer of the spine and brain  Patient's presentation is most consistent with acute presentation with potential threat to life or bodily function.  Patient presents with continued functional decline after recent hospitalization in the setting of spine cancer with mets to the brain.  Had a fall today.  Not safe at home with available support.  Will check labs, x-ray right hip.   Clinical Course as of 06/09/24 2217  Mon Jun 09, 2024  2217 Case d/w hospitalist [PS]    Clinical Course User Index [PS] Viviann Pastor, MD     FINAL CLINICAL IMPRESSION(S) / ED DIAGNOSES   Final diagnoses:  Cystitis  Acute urinary retention  Generalized weakness  Malignant neoplasm metastatic to brain [facility])     Rx / DC Orders   ED Discharge Orders     None        Note:  This document was prepared using Dragon voice recognition software and may include unintentional dictation errors.   Viviann Pastor, MD 06/09/24 2217

## 2024-06-09 NOTE — H&P (Signed)
 History and Physical    Roger Black FMW:[id number] DOB: 1955/12/16 DOA: 06/09/2024  Referring MD/NP/PA:   PCP: [name], MD   Patient coming from:  The patient is coming from home.     Chief Complaint: Weakness and fall  HPI: Roger Black is a 68 y.o. 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  Patient is s/p of craniotomy with brain tumor resection.  Patient is s/p of radiation therapy, currently not on chemotherapy.  Per patient and his wife at the bedside, after he went home, patient still has weakness particularly in both legs.  Initially he could walk a little bit using walker, but today he could not walk anymore.  He has generalized weakness, and much weaker in both legs. He fell on 11/12. No LOC.  He has some right hip pain.  No numbness or tingling in extremities.  No facial droop or slurred speech.  No loss control of bladder or bowel movement.  Patient does not have chest pain, cough, SOB.  No nausea, vomiting, diarrhea or abdominal pain.  He has difficulty urinating, found to have acute urinary retention with 478 mL in the ED. Foley catheter is ordered.  Patient denies dysuria or burning with urination.  No fever or chills.  Data reviewed independently and ED Course: pt was found to have WBC 3.8, GFR> 60, potassium 3.4, pro-BNP 502 UA (cloudy appearance, negative leukocyte, positive nitrite, rare bacteria, WBC 0-5), temperature normal, blood pressure 133/84, heart rate of 100 --> 89, RR 13, oxygen  saturation 97% on room air.  X-ray of her right hip/pelvis is negative for bony  fracture.  Patient is placed in telemetry bed for observation.  CT-head: 1. No acute intracranial abnormality. 2. Similar appearing bilateral frontal and left cerebelalr vasogenic edema.   EKG: I have personally reviewed.  Sinus rhythm, QTc 394, nonspecific T wave change.   Review of Systems:   General: no fevers, chills, no body weight gain, has poor appetite, has fatigue and weakness HEENT: no blurry vision, hearing changes or sore throat Respiratory: no dyspnea, coughing, wheezing CV: no chest pain, no palpitations GI: no nausea, vomiting, abdominal pain, diarrhea, constipation GU: no dysuria, burning on urination, increased urinary frequency, hematuria  Ext: has leg edema Neuro: no unilateral numbness, or tingling.  No facial droop. Skin: no rash, no skin tear. MSK: No muscle spasm, no deformity, no limitation of range of movement in spin Heme: No easy bruising.  Travel history: No recent long distant travel.   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   right lateral hand,  treating with 5FU/calcipotriene   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   vertex scalp, treating with 5FU/calcipotriene    Past Surgical History:  Procedure Laterality Date   APPLICATION OF CRANIAL NAVIGATION Right 05/08/2024   Procedure: COMPUTER-ASSISTED NAVIGATION, FOR CRANIAL PROCEDURE;  Surgeon: [name] Dino HERO, MD;  Location: [facility] OR;  Service:  Neurosurgery;  Laterality: Right;   COLON SURGERY     COLONOSCOPY WITH PROPOFOL  N/A 10/20/2020   Procedure: COLONOSCOPY WITH PROPOFOL ;  Surgeon: Toledo, Ladell POUR, MD;  Location: [facility] ENDOSCOPY;  Service: Gastroenterology;  Laterality: N/A;   CRANIOTOMY Right 05/08/2024   Procedure: CRANIOTOMY TUMOR EXCISION;  Surgeon: [name] Dino HERO, MD;  Location: [facility] OR;  Service: Neurosurgery;  Laterality: Right;    Social History:  reports that he has quit smoking. His smoking use included cigarettes. He has a 47 pack-year smoking history. He has never used smokeless tobacco. He reports that he does not drink alcohol  and does not use drugs.  Family History:  Family History  Problem Relation Age of Onset   Hypertension Mother    Hypertension Father      Prior to Admission medications   Medication Sig Start Date End Date Taking? Authorizing Provider  acetaminophen (TYLENOL) 500 MG tablet Take 2 tablets (1,000 mg total) by mouth 3 (three) times daily. 06/02/24   Love, Sharlet RAMAN, PA-C  albuterol (VENTOLIN HFA) 108 (90 Base) MCG/ACT inhaler Inhale 2 puffs into the lungs every 4 (four) hours as needed for shortness of breath. 06/07/20   [provider]  apixaban (ELIQUIS) 5 MG TABS tablet Take two pills twice a day through 11/15. Starting 11/16 am, decrease to one pill twice a day. 06/02/24   Love, Sharlet RAMAN, PA-C  atorvastatin (LIPITOR) 80 MG tablet Take 80 mg by mouth daily. 08/19/19 05/07/24  [provider]  Baclofen 5 MG TABS Take 1 tablet (5 mg total) by mouth 3 (three) times daily. 06/02/24   Love, Sharlet RAMAN, PA-C  budesonide-formoterol (SYMBICORT) 160-4.5 MCG/ACT inhaler Inhale 2 puffs into the lungs 2 (two) times daily.    [provider]  vitamin D3 (CHOLECALCIFEROL) 25 MCG tablet Take 1 tablet (1,000 Units total) by mouth daily. 06/03/24   Love, Sharlet RAMAN, PA-C  clonazePAM (KLONOPIN) 0.5 MG tablet Take 1 tablet (0.5 mg total) by mouth 3 (three) times daily as needed  (myoclonus / spasms). 06/02/24   Love, Sharlet RAMAN, PA-C  dexamethasone (DECADRON) 2 MG tablet Take two tablets by mouth twice a day through 11/13. Then decrease to one tablet twice a day from 11/14-11/20. Then decrease to one daily from11/21 to 11/27. Then decrease to one tablet every other day from 11/28-12/04. 06/02/24   Love, Sharlet RAMAN, PA-C  DULoxetine (CYMBALTA) 60 MG capsule Take 1 capsule (60 mg total) by mouth daily. 06/02/24   Love, Sharlet RAMAN, PA-C  ipratropium (ATROVENT) 0.03 % nasal spray Place into the nose. 12/26/22 04/28/24  [provider]  levETIRAcetam (KEPPRA) 500 MG tablet Take 1 tablet (500 mg total) by mouth 2 (two) times daily. 06/02/24   Love, Sharlet RAMAN, PA-C  montelukast (SINGULAIR) 10 MG tablet Take 1 tablet (10 mg total) by mouth daily. 06/02/24   Love, Sharlet RAMAN, PA-C  naloxone [facility]) nasal spray 4 mg/0.1 mL Use in case of overdose 06/02/24   Love, Pamela S, PA-C  Oxycodone HCl 10 MG TABS Take 1 tablet (10 mg total) by mouth every 4 (four) hours as needed for severe pain (pain score 7-10). 06/02/24  Love, Pamela S, PA-C  pantoprazole (PROTONIX) 40 MG tablet Take 1 tablet (40 mg total) by mouth daily. 06/02/24 11/05/28  Love, Sharlet RAMAN, PA-C  polyethylene glycol powder (GLYCOLAX/MIRALAX) 17 GM/SCOOP powder Take 17 g by mouth 2 (two) times daily. Dissolve 1 capful (17g) in 4-8 ounces of liquid and take by mouth daily. 06/02/24   Love, Sharlet RAMAN, PA-C  pregabalin (LYRICA) 150 MG capsule Take 1 capsule (150 mg total) by mouth 3 (three) times daily. 06/02/24   Love, Sharlet RAMAN, PA-C  senna-docusate (SENOKOT-S) 8.6-50 MG tablet Take 1 tablet by mouth at bedtime. 06/03/24   Love, Sharlet RAMAN, PA-C  tamsulosin (FLOMAX) 0.4 MG CAPS capsule Take 1 capsule (0.4 mg total) by mouth daily after supper. 06/02/24   Love, Sharlet RAMAN, PA-C  tiZANidine (ZANAFLEX) 2 MG tablet Take 1 tablet (2 mg total) by mouth 3 (three) times daily. 06/02/24   Love, Sharlet RAMAN, PA-C  traZODone (DESYREL) 50 MG tablet  Take 1 tablet (50 mg total) by mouth at bedtime as needed for sleep. 06/02/24   Maurice Sharlet RAMAN DEVONNA    Physical Exam: Vitals:   06/09/24 2244 06/09/24 2300 06/09/24 2343 06/09/24 2352  BP:  (!) 153/98 (!) 153/82   Pulse:  92 93   Resp:  15 17   Temp: 98.2 F (36.8 C)  97.6 F (36.4 C)   TempSrc: Oral     SpO2:  94% 96%   Weight:    86 kg  Height:    5' 7 (1.702 m)   General: Not in acute distress HEENT:       Eyes: PERRL, EOMI, no jaundice       ENT: No discharge from the ears and nose, no pharynx injection, no tonsillar enlargement.        Neck: No JVD, no bruit, no mass felt. Heme: No neck lymph node enlargement. Cardiac: S1/S2, RRR, No murmurs, No gallops or rubs. Respiratory: No rales, wheezing, rhonchi or rubs. GI: Soft, nondistended, nontender, no rebound pain, no organomegaly, BS present. GU: No hematuria Ext: has 1+ pitting leg edema bilaterally. 1+DP/PT pulse bilaterally. Musculoskeletal: No joint deformities, No joint redness or warmth, no limitation of ROM in spin. Skin: No rashes.  Neuro: Alert, oriented X3, cranial nerves II-XII grossly intact.  Patient has generalized weakness, particularly in both legs, muscle strength 1/5 in both legs.   Psych: Patient is not psychotic, no suicidal or hemocidal ideation.  Labs on Admission: I have personally reviewed following labs and imaging studies  CBC: Recent Labs  Lab 06/09/24 1812  WBC 3.8*  NEUTROABS 3.2  HGB 12.4*  HCT 37.1*  MCV 96.1  PLT 68*   Basic Metabolic Panel: Recent Labs  Lab 06/03/24 0505 06/09/24 1812  NA 134* 135  K 5.0 3.4*  CL 99 98  CO2 25 28  GLUCOSE 116* 114*  BUN 27* 16  CREATININE 0.93 0.85  CALCIUM 8.9 9.2  MG  --  1.7  PHOS  --  2.5   GFR: Estimated Creatinine Clearance: 87.2 mL/min (by C-G formula based on SCr of 0.85 mg/dL). Liver Function Tests: Recent Labs  Lab 06/09/24 1812  AST 31  ALT 35  ALKPHOS 60  BILITOT 0.5  PROT 5.5*  ALBUMIN 3.3*   No results for  input(s): LIPASE, AMYLASE in the last 168 hours. No results for input(s): AMMONIA in the last 168 hours. Coagulation Profile: No results for input(s): INR, PROTIME in the last 168 hours. Cardiac Enzymes: No results for  input(s): CKTOTAL, CKMB, CKMBINDEX, TROPONINI in the last 168 hours. BNP (last 3 results) Recent Labs    06/09/24 1812  PROBNP 502.0*   HbA1C: No results for input(s): HGBA1C in the last 72 hours. CBG: No results for input(s): GLUCAP in the last 168 hours. Lipid Profile: No results for input(s): CHOL, HDL, LDLCALC, TRIG, CHOLHDL, LDLDIRECT in the last 72 hours. Thyroid  Function Tests: No results for input(s): TSH, T4TOTAL, FREET4, T3FREE, THYROIDAB in the last 72 hours. Anemia Panel: No results for input(s): VITAMINB12, FOLATE, FERRITIN, TIBC, IRON, RETICCTPCT in the last 72 hours. Urine analysis:    Component Value Date/Time   COLORURINE YELLOW (A) 06/09/2024 2000   APPEARANCEUR CLOUDY (A) 06/09/2024 2000   LABSPEC 1.016 06/09/2024 2000   PHURINE 7.0 06/09/2024 2000   GLUCOSEU NEGATIVE 06/09/2024 2000   HGBUR SMALL (A) 06/09/2024 2000   BILIRUBINUR NEGATIVE 06/09/2024 2000   KETONESUR NEGATIVE 06/09/2024 2000   PROTEINUR NEGATIVE 06/09/2024 2000   NITRITE POSITIVE (A) 06/09/2024 2000   LEUKOCYTESUR NEGATIVE 06/09/2024 2000   Sepsis Labs: @LABRCNTIP (procalcitonin:4,lacticidven:4) )No results found for this or any previous visit (from the past 240 hours).   Radiological Exams on Admission:   Assessment/Plan Principal Problem:   Generalized weakness Active Problems:   Fall at home, initial encounter   Acute urinary retention   Benign hypertension   COPD (chronic obstructive pulmonary disease) (HCC)   Chronic systolic CHF (congestive heart failure) (HCC)   HLD (hyperlipidemia)   Hypokalemia   Chronic kidney disease, stage 3a (HCC)   Thrombocytopenia   Metastatic cancer to brain [facility])    Malignant neoplasm of upper lobe of left lung (HCC)   DVT (deep venous thrombosis) (HCC)   Assessment and Plan:  Generalized weakness and Fall: Patient has generalized weakness, and worsening bilateral leg weakness.  Etiology is not clear.  Urinalysis showed positive nitrite and rare bacteria, otherwise negative.  Denies symptoms of UTI.  Clinically does not seem to have UTI.  Possibly due to the sequela of recent craniotomy.  CT head negative for acute intracranial abnormalities.  -Place in telemetry bed for observation - Fall precaution - PT/OT - Palliative consult  Acute urinary retention: Received 1 dose of Rocephin, will hold off antibiotics now as discussed above, patient does not seem to have UTI -Foley cath -f/u urine culture - Flomax  Benign hypertension -IV hydralazine as needed  COPD (chronic obstructive pulmonary disease) (HCC): No wheezing -Bronchodilators and as needed Mucinex  Chronic systolic CHF (congestive heart failure) (HCC): 2D echo on 02/23/2024 showed EF of 45%.  His on torsemide and spironolactone are on hold from recent admission.  Patient has leg edema, but BNP is only 502.  No SOB.  Does not seem to have CHF exacerbation. - Watch volume status closely  HLD (hyperlipidemia) -Lipitor  Hypokalemia: Potassium 0.4.  Magnesium 1.7, phosphorus 2.5 - Repleted potassium  Chronic kidney disease, stage 3a (HCC): Stable, GFR> 60 -Follow-up with BMP  Thrombocytopenia: This is chronic issue.  Platelets 68 -Follow-up with CBC  Malignant neoplasm of upper lobe of left lung and metastatic cancer to brain: s/p of craniotomy and radiation therapy.  Patient is not on chemotherapy currently. -Palliative consult - Continue home Decadron, currently 4 mg daily  DVT: - Eliquis  DVT ppx: on Eliquius  Code Status: DNR per his wife  Family Communication:   Yes, patient's  wife  at bed side.    Disposition Plan:  Anticipate discharge back to previous  environment  Consults called:  none  Admission status and Level of care: Telemetry:    for obs     Dispo: The patient is from: Home              Anticipated d/c is to: Home              Anticipated d/c date is: 1 day              Patient currently is not medically stable to d/c.    Severity of Illness:  The appropriate patient status for this patient is OBSERVATION. Observation status is judged to be reasonable and necessary in order to provide the required intensity of service to ensure the patient's safety. The patient's presenting symptoms, physical exam findings, and initial radiographic and laboratory data in the context of their medical condition is felt to place them at decreased risk for further clinical deterioration. Furthermore, it is anticipated that the patient will be medically stable for discharge from the hospital within 2 midnights of admission.        Date of Service 06/10/2024    Caleb Exon [facility]   If 7PM-7AM, please contact night-coverage [email or web address] 06/10/2024, 3:33 AM

## 2024-06-09 NOTE — ED Triage Notes (Signed)
 Pt to ED via [facility] from home for recurrent falls and decreased mobility. Pt has brain and spine cancer. Pt was recently in the hospital and had biopsy done. After patient was discharged he has decrease in mobility, pt is not able to ambulate now. Pt is in NAD at this time.

## 2024-06-09 NOTE — ED Notes (Signed)
 Urine collected and brief changed. Barrier cream put on pts front and sacral pad put on pts rear.

## 2024-06-09 NOTE — ED Notes (Addendum)
 Bladder scan showed 

## 2024-06-10 ENCOUNTER — Other Ambulatory Visit (HOSPITAL_COMMUNITY): Payer: Self-pay

## 2024-06-10 ENCOUNTER — Encounter: Admitting: Neurosurgery

## 2024-06-10 DIAGNOSIS — Z515 Encounter for palliative care: Secondary | ICD-10-CM

## 2024-06-10 DIAGNOSIS — R531 Weakness: Secondary | ICD-10-CM | POA: Diagnosis not present

## 2024-06-10 DIAGNOSIS — E785 Hyperlipidemia, unspecified: Secondary | ICD-10-CM

## 2024-06-10 DIAGNOSIS — Z86718 Personal history of other venous thrombosis and embolism: Secondary | ICD-10-CM

## 2024-06-10 DIAGNOSIS — W19XXXA Unspecified fall, initial encounter: Secondary | ICD-10-CM | POA: Diagnosis not present

## 2024-06-10 DIAGNOSIS — C7931 Secondary malignant neoplasm of brain: Secondary | ICD-10-CM | POA: Diagnosis not present

## 2024-06-10 DIAGNOSIS — R338 Other retention of urine: Secondary | ICD-10-CM | POA: Diagnosis not present

## 2024-06-10 LAB — BASIC METABOLIC PANEL WITH GFR
Anion gap: 12 (ref 5–15)
BUN: 13 mg/dL (ref 8–23)
CO2: 25 mmol/L (ref 22–32)
Calcium: 8.9 mg/dL (ref 8.9–10.3)
Chloride: 100 mmol/L (ref 98–111)
Creatinine, Ser: 0.68 mg/dL (ref 0.61–1.24)
GFR, Estimated: >60 mL/min (ref >60)
Glucose, Bld: 90 mg/dL (ref 70–99)
Potassium: 3.5 mmol/L (ref 3.5–5.1)
Sodium: 137 mmol/L (ref 135–145)

## 2024-06-10 LAB — CBC
HCT: 36.6 % — ABNORMAL LOW (ref 39.0–52.0)
Hemoglobin: 12.2 g/dL — ABNORMAL LOW (ref 13.0–17.0)
MCH: 32.1 pg (ref 26.0–34.0)
MCHC: 33.3 g/dL (ref 30.0–36.0)
MCV: 96.3 fL (ref 80.0–100.0)
Platelets: 57 K/uL — ABNORMAL LOW (ref 150–400)
RBC: 3.8 MIL/uL — ABNORMAL LOW (ref 4.22–5.81)
RDW: 14 % (ref 11.5–15.5)
WBC: 4.2 K/uL (ref 4.0–10.5)
nRBC: 0 % (ref 0.0–0.2)

## 2024-06-10 LAB — URINE CULTURE

## 2024-06-10 MED ORDER — IPRATROPIUM BROMIDE 0.03 % NA SOLN: 1.0000 | Freq: Two times a day (BID) | NASAL | Status: DC | PRN

## 2024-06-10 MED ORDER — PANTOPRAZOLE SODIUM 40 MG PO TBEC
40.0000 mg | DELAYED_RELEASE_TABLET | Freq: Every day | ORAL | Status: DC
  Administered 2024-06-10 – 2024-06-18 (×9): 40 mg via ORAL
  Filled 2024-06-10 (×9): qty 1

## 2024-06-10 MED ORDER — DEXAMETHASONE 4 MG PO TABS
4.0000 mg | ORAL_TABLET | Freq: Every day | ORAL | Status: DC
  Administered 2024-06-10 – 2024-06-21 (×11): 4 mg via ORAL
  Filled 2024-06-10 (×13): qty 1

## 2024-06-10 MED ORDER — FLUTICASONE FUROATE-VILANTEROL 100-25 MCG/ACT IN AEPB
1.0000 | INHALATION_SPRAY | Freq: Every day | RESPIRATORY_TRACT | Status: DC
  Administered 2024-06-10 – 2024-06-21 (×11): 1 via RESPIRATORY_TRACT
  Filled 2024-06-10: qty 28

## 2024-06-10 MED ORDER — POLYETHYLENE GLYCOL 3350 17 G PO PACK
17.0000 g | PACK | Freq: Two times a day (BID) | ORAL | Status: DC
  Administered 2024-06-10 – 2024-06-22 (×16): 17 g via ORAL
  Filled 2024-06-10 (×22): qty 1

## 2024-06-10 MED ORDER — POTASSIUM CHLORIDE 20 MEQ PO PACK
20.0000 meq | PACK | Freq: Once | ORAL | Status: AC
  Administered 2024-06-10: 20 meq via ORAL
  Filled 2024-06-10: qty 1

## 2024-06-10 MED ORDER — VITAMIN D 25 MCG (1000 UNIT) PO TABS
1000.0000 [IU] | ORAL_TABLET | Freq: Every day | ORAL | Status: DC
  Administered 2024-06-10 – 2024-06-18 (×9): 1000 [IU] via ORAL
  Filled 2024-06-10 (×8): qty 1

## 2024-06-10 MED ORDER — ATORVASTATIN CALCIUM 20 MG PO TABS
80.0000 mg | ORAL_TABLET | Freq: Every day | ORAL | Status: DC
  Administered 2024-06-10 – 2024-06-17 (×8): 80 mg via ORAL
  Filled 2024-06-10 (×9): qty 4

## 2024-06-10 MED ORDER — APIXABAN 5 MG PO TABS
5.0000 mg | ORAL_TABLET | Freq: Two times a day (BID) | ORAL | Status: DC
  Administered 2024-06-10 – 2024-06-22 (×22): 5 mg via ORAL
  Filled 2024-06-10 (×24): qty 1

## 2024-06-10 MED ORDER — TAMSULOSIN HCL 0.4 MG PO CAPS
0.4000 mg | ORAL_CAPSULE | Freq: Every day | ORAL | Status: DC
  Administered 2024-06-10 – 2024-06-16 (×7): 0.4 mg via ORAL
  Filled 2024-06-10 (×9): qty 1

## 2024-06-10 MED ORDER — METOPROLOL TARTRATE 25 MG PO TABS
12.5000 mg | ORAL_TABLET | Freq: Two times a day (BID) | ORAL | Status: DC
  Administered 2024-06-10 – 2024-06-22 (×21): 12.5 mg via ORAL
  Filled 2024-06-10 (×23): qty 1

## 2024-06-10 MED ORDER — MONTELUKAST SODIUM 10 MG PO TABS
10.0000 mg | ORAL_TABLET | Freq: Every day | ORAL | Status: DC
Start: 2024-06-10 — End: 2024-06-18
  Administered 2024-06-10 – 2024-06-18 (×9): 10 mg via ORAL
  Filled 2024-06-10 (×9): qty 1

## 2024-06-10 MED ORDER — PREGABALIN 75 MG PO CAPS
150.0000 mg | ORAL_CAPSULE | Freq: Three times a day (TID) | ORAL | Status: DC
  Administered 2024-06-10 – 2024-06-22 (×31): 150 mg via ORAL
  Filled 2024-06-10 (×36): qty 2

## 2024-06-10 MED ORDER — CHLORHEXIDINE GLUCONATE CLOTH 2 % EX PADS
6.0000 | MEDICATED_PAD | Freq: Every day | CUTANEOUS | Status: DC
  Administered 2024-06-10 – 2024-06-24 (×15): 6 via TOPICAL

## 2024-06-10 MED ORDER — LEVETIRACETAM 500 MG PO TABS
500.0000 mg | ORAL_TABLET | Freq: Two times a day (BID) | ORAL | Status: DC
  Administered 2024-06-10 – 2024-06-22 (×22): 500 mg via ORAL
  Filled 2024-06-10 (×25): qty 1

## 2024-06-10 MED ORDER — MAGNESIUM SULFATE 2 GM/50ML IV SOLN
2.0000 g | Freq: Once | INTRAVENOUS | Status: AC
  Administered 2024-06-10: 2 g via INTRAVENOUS
  Filled 2024-06-10: qty 50

## 2024-06-10 MED ORDER — DULOXETINE HCL 30 MG PO CPEP
60.0000 mg | ORAL_CAPSULE | Freq: Every day | ORAL | Status: DC
  Administered 2024-06-10 – 2024-06-21 (×11): 60 mg via ORAL
  Filled 2024-06-10 (×13): qty 2

## 2024-06-10 MED ORDER — TRAZODONE HCL 50 MG PO TABS
50.0000 mg | ORAL_TABLET | Freq: Every evening | ORAL | Status: DC | PRN
  Administered 2024-06-10 – 2024-06-21 (×4): 50 mg via ORAL
  Filled 2024-06-10 (×4): qty 1

## 2024-06-10 MED ORDER — OXYCODONE HCL 5 MG PO TABS
10.0000 mg | ORAL_TABLET | ORAL | Status: DC | PRN
  Administered 2024-06-10 – 2024-06-21 (×18): 10 mg via ORAL
  Filled 2024-06-10 (×20): qty 2

## 2024-06-10 MED ORDER — TIZANIDINE HCL 2 MG PO TABS
2.0000 mg | ORAL_TABLET | Freq: Three times a day (TID) | ORAL | Status: DC
  Administered 2024-06-10 – 2024-06-22 (×31): 2 mg via ORAL
  Filled 2024-06-10 (×48): qty 1

## 2024-06-10 MED ORDER — SENNOSIDES-DOCUSATE SODIUM 8.6-50 MG PO TABS
1.0000 | ORAL_TABLET | Freq: Every day | ORAL | Status: DC
  Administered 2024-06-10 – 2024-06-22 (×10): 1 via ORAL
  Filled 2024-06-10 (×8): qty 1

## 2024-06-10 MED ORDER — CLONAZEPAM 0.5 MG PO TABS
0.5000 mg | ORAL_TABLET | Freq: Three times a day (TID) | ORAL | Status: DC | PRN
  Administered 2024-06-10 – 2024-06-19 (×6): 0.5 mg via ORAL
  Filled 2024-06-10 (×6): qty 1

## 2024-06-10 NOTE — Progress Notes (Signed)
 SLP Cancellation Note  Patient Details Name: Roger [name] MRN: [id number] DOB: 1956/04/14   Cancelled treatment:       Reason Eval/Treat Not Completed:  (chart reviewed; consulted Palliative Care NP, Josh, re: POC at this time. Josh stated pt has pharyngeal secretions causing a wet vocal quality at baseline currenlty.)  Post discussion w/ Palliative Care NP, Josh, he recommended HOLDING on assessment at this time d/t pt's overall decline and presentation. He stated he would be talking w/ Wife later today re: imminent GOC/POC, which could include a Hospice referral/admit.  ST services will f/u tomorrow w/ assessment if appropriate. Recommend giving any necessary oral meds Crushed in Puree for safer swallowing and giving oral intake/meals only when pt is FULLY alert/awake- State-appropriate. Offer frequent oral care for hygiene and comfort.  MD/NSG updated.     Comer Portugal, MS, CCC-SLP Speech Language Pathologist Rehab Services; [facility] [phone] (ascom) Jannelly Bergren 06/10/2024, 2:35 PM

## 2024-06-10 NOTE — Assessment & Plan Note (Addendum)
 Falls. Patient with worsening generalized weakness and unable to walk or do ADLs now.  Recently discharged from CIR when he was able to walk some with the help of walker before discharge.  History of stage IV lung cancer with progression, recent craniotomy and palliative radiation to brain and cervical spine.  Also has leptomeningeal spread.  Palliative care was consulted. PT and OT evaluation.

## 2024-06-10 NOTE — TOC CM/SW Note (Addendum)
 RNCM received a secure chat message from Daphne Shed with [facility] stating that a referral had been placed for hospice and asking if she could assess the patient. RNCM informed her that I will speak with the family first and then notify her if and when she may see the patient. RNCM called patient's wife [name] @ [phone]. Left a message. Awaiting a call back. RNCM also called patients room to see if his wife was here at the facility. Josh NP answered the phone and said that he didn't know about hospice referral but that is the course that is was planning on talking to his wife about and that he had tried to call the wife as well with no response. RNCM will continue to follow for discharge planning needs.

## 2024-06-10 NOTE — Assessment & Plan Note (Addendum)
-   Continue Eliquis - Needed close monitoring due to thrombocytopenia.

## 2024-06-10 NOTE — Assessment & Plan Note (Signed)
 No current concern of exacerbation, no wheezing. -Continue with bronchodilators as needed

## 2024-06-10 NOTE — Assessment & Plan Note (Signed)
 -  Continue Lipitor

## 2024-06-10 NOTE — Assessment & Plan Note (Signed)
 Renal function normal with GFR above 60.  CKD stage IIIa was mentioned in his chart. -Monitor renal function

## 2024-06-10 NOTE — Consult Note (Signed)
 Hematology/Oncology Consult note [facility] Telephone:([phone] [phone]  Patient Care Team: [name], MD as PCP - General (Internal Medicine) Verdene Gills, RN as Oncology Nurse Navigator   Name of the patient: Roger [name]  [id number]  [birth year]    Reason for referral- ***   Referring physician- ***  Date of visit: @TODAY @   History of presenting illness- ***  ECOG PS- ***  Pain scale- ***   Review of systems- ROS  No Known Allergies  Patient Active Problem List   Diagnosis Date Noted   History of DVT (deep vein thrombosis) 06/10/2024   Palliative care encounter 06/10/2024   Generalized weakness 06/09/2024   Fall at home, initial encounter 06/09/2024   HLD (hyperlipidemia) 06/09/2024   Hypokalemia 06/09/2024   Acute urinary retention 06/09/2024   Thrombocytopenia 06/09/2024   COPD (chronic obstructive pulmonary disease) (HCC) 06/09/2024   Hyperlipidemia 06/05/2024   Allergic rhinitis 06/05/2024   Microscopic hematuria 06/05/2024   Obstructive sleep apnea 06/05/2024   Osteoarthritis 06/05/2024   Prediabetes 06/05/2024   Insomnia 06/04/2024   Malignant neoplasm metastatic to brain (HCC) 05/15/2024   Brain tumor (HCC) 05/08/2024   Brain mass 05/07/2024   Abnormal findings on diagnostic imaging of spine 04/29/2024   Back pain 04/29/2024   Leg pain 04/28/2024   Obesity (BMI 30-39.9) 04/28/2024   Chronic systolic CHF (congestive heart failure) (HCC)    Chronic kidney disease, stage 3a (HCC)    HFrEF (heart failure with reduced ejection fraction) (HCC) 03/11/2024   Pulmonary emphysema (HCC) 02/06/2024   Non-small cell cancer of left lung (HCC) 02/06/2024   Malignant neoplasm of upper lobe of left lung (HCC) 01/19/2023   Benign hypertension 07/21/2021   Aortic atherosclerosis 10/12/2016   Coronary artery calcification seen on CT scan 09/06/2016   Personal history of tobacco use, presenting hazards to health  09/21/2015     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   right lateral hand, treating with 5FU/calcipotriene   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   vertex scalp, treating with 5FU/calcipotriene     Past Surgical History:  Procedure Laterality Date   APPLICATION OF CRANIAL NAVIGATION Right 05/08/2024   Procedure: COMPUTER-ASSISTED NAVIGATION, FOR CRANIAL PROCEDURE;  Surgeon: [name] Dino HERO, MD;  Location: [facility] OR;  Service: Neurosurgery;  Laterality: Right;   COLON SURGERY     COLONOSCOPY WITH PROPOFOL  N/A 10/20/2020   Procedure: COLONOSCOPY WITH PROPOFOL ;  Surgeon: Toledo, Ladell POUR, MD;  Location: [facility] ENDOSCOPY;  Service: Gastroenterology;  Laterality: N/A;   CRANIOTOMY Right 05/08/2024   Procedure: CRANIOTOMY TUMOR EXCISION;  Surgeon: [name] Dino HERO, MD;  [facility];  Service: Neurosurgery;  Laterality: Right;    Social History   Socioeconomic History   Marital status: Married    Spouse name: Not on file   Number of children: Not on file   Years of education: Not on  file   Highest education level: Not on file  Occupational History   Not on file  Tobacco Use   Smoking status: Former    Current packs/day: 1.00    Average packs/day: 1 pack/day for 47.0 years (47.0 ttl pk-yrs)    Types: Cigarettes   Smokeless tobacco: Never  Vaping Use   Vaping status: Never Used  Substance and Sexual Activity   Alcohol  use: Never   Drug use: Never   Sexual  activity: Not on file  Other Topics Concern   Not on file  Social History Narrative   Not on file   Social Drivers of Health   Financial Resource Strain: Low Risk  (03/07/2024)   Received from [facility]   Overall Financial Resource Strain (CARDIA)    Difficulty of Paying Living Expenses: Not hard at all  Recent Concern: Financial Resource Strain - High Risk (01/29/2024)   Received from [facility]   Overall Financial Resource Strain (CARDIA)    Difficulty of Paying Living Expenses: Hard  Food Insecurity: Patient Declined (06/10/2024)   Hunger Vital Sign    Worried About Running Out of Food in the Last Year: Patient declined    Ran Out of Food in the Last Year: Patient declined  Transportation Needs: No Transportation Needs (06/10/2024)   PRAPARE - Administrator, Civil Service (Medical): No    Lack of Transportation (Non-Medical): No  Physical Activity: Inactive (08/07/2023)   Received from [facility]   Exercise Vital Sign    On average, how many days per week do you engage in moderate to strenuous exercise (like a brisk walk)?: 0 days    On average, how many minutes do you engage in exercise at this level?: 0 min  Stress: Stress Concern Present (08/07/2023)   Received from [facility] Occupational [facility] - Occupational Stress Questionnaire    Feeling of Stress : Rather much  Social Connections: Patient Declined (06/10/2024)   Social Connection and Isolation Panel    Frequency of Communication with Friends and Family: Patient declined    Frequency of Social Gatherings with Friends and Family: Patient declined    Attends Religious Services: Patient declined    Active Member of Clubs or Organizations: Patient declined    Attends Banker Meetings: Patient declined    Marital Status: Patient declined  Intimate Partner Violence: Not At Risk (06/10/2024)    Humiliation, Afraid, Rape, and Kick questionnaire    Fear of Current or Ex-Partner: No    Emotionally Abused: No    Physically Abused: No    Sexually Abused: No     Family History  Problem Relation Age of Onset   Hypertension Mother    Hypertension Father      Current Facility-Administered Medications:    acetaminophen (TYLENOL) tablet 650 mg, 650 mg, Oral, Q6H PRN, Niu, Xilin, MD   albuterol (PROVENTIL) (2.5 MG/3ML) 0.083% nebulizer solution 2.5 mg, 2.5 mg, Inhalation, Q4H PRN, Niu, Xilin, MD   apixaban (ELIQUIS) tablet 5 mg, 5 mg, Oral, BID, Niu, Xilin, MD, 5 mg at 06/10/24 0842   atorvastatin (LIPITOR) tablet 80 mg, 80 mg, Oral, Daily, Niu, Xilin, MD, 80 mg at 06/10/24 9157   Chlorhexidine Gluconate Cloth 2 % PADS 6 each, 6 each, Topical, Daily, Amin, Sumayya, MD   cholecalciferol (VITAMIN D3) 25 MCG (1000 UNIT) tablet 1,000 Units, 1,000 Units, Oral, Daily, Niu, Xilin, MD, 1,000 Units at  06/10/24 0842   clonazePAM (KLONOPIN) tablet 0.5 mg, 0.5 mg, Oral, TID PRN, Niu, Xilin, MD, 0.5 mg at 06/10/24 9661   dexamethasone (DECADRON) tablet 4 mg, 4 mg, Oral, Daily, Niu, Xilin, MD, 4 mg at 06/10/24 9157   dextromethorphan-guaiFENesin (MUCINEX DM) 30-600 MG per 12 hr tablet 1 tablet, 1 tablet, Oral, BID PRN, Niu, Xilin, MD   DULoxetine (CYMBALTA) DR capsule 60 mg, 60 mg, Oral, Daily, Niu, Xilin, MD, 60 mg at 06/10/24 0841   fluticasone furoate-vilanterol (BREO ELLIPTA) 100-25 MCG/ACT 1 puff, 1 puff, Inhalation, Daily, Niu, Xilin, MD, 1 puff at 06/10/24 0848   hydrALAZINE (APRESOLINE) injection 5 mg, 5 mg, Intravenous, Q2H PRN, Niu, Xilin, MD   ipratropium (ATROVENT) 0.03 % nasal spray 1 spray, 1 spray, Nasal, BID PRN, Niu, Xilin, MD   levETIRAcetam (KEPPRA) tablet 500 mg, 500 mg, Oral, BID, Niu, Xilin, MD, 500 mg at 06/10/24 9157   metoprolol tartrate (LOPRESSOR) tablet 12.5 mg, 12.5 mg, Oral, BID, Amin, Sumayya, MD, 12.5 mg at 06/10/24 1311   montelukast (SINGULAIR) tablet 10 mg, 10 mg,  Oral, Daily, Niu, Xilin, MD, 10 mg at 06/10/24 0842   ondansetron (ZOFRAN) injection 4 mg, 4 mg, Intravenous, Q8H PRN, Niu, Xilin, MD   oxyCODONE (Oxy IR/ROXICODONE) immediate release tablet 10 mg, 10 mg, Oral, Q4H PRN, Niu, Xilin, MD, 10 mg at 06/10/24 0338   pantoprazole (PROTONIX) EC tablet 40 mg, 40 mg, Oral, Daily, Niu, Xilin, MD, 40 mg at 06/10/24 9157   polyethylene glycol (MIRALAX / GLYCOLAX) packet 17 g, 17 g, Oral, BID, Niu, Xilin, MD, 17 g at 06/10/24 0843   pregabalin (LYRICA) capsule 150 mg, 150 mg, Oral, TID, Niu, Xilin, MD, 150 mg at 06/10/24 0841   senna-docusate (Senokot-S) tablet 1 tablet, 1 tablet, Oral, QHS, Niu, Xilin, MD   tamsulosin (FLOMAX) capsule 0.4 mg, 0.4 mg, Oral, QPC supper, Niu, Xilin, MD   tiZANidine (ZANAFLEX) tablet 2 mg, 2 mg, Oral, TID, Niu, Xilin, MD, 2 mg at 06/10/24 0847   traZODone (DESYREL) tablet 50 mg, 50 mg, Oral, QHS PRN, Niu, Xilin, MD, 50 mg at 06/10/24 9661   Physical exam:  Vitals:   06/10/24 1032 06/10/24 1144 06/10/24 1232 06/10/24 1542  BP: 119/88 129/87 125/81 (!) 142/96  Pulse: (!) 141 (!) 113 (!) 116 86  Resp: 16 18 18 14   Temp: 98.2 F (36.8 C) 98.6 F (37 C) 98.4 F (36.9 C) 98.3 F (36.8 C)  TempSrc: Oral Oral Oral Oral  SpO2: 94% 95% 95% 96%  Weight:      Height:       Physical Exam        Latest Ref Rng & Units 06/10/2024    4:46 AM  CMP  Glucose 70 - 99 mg/dL 90   BUN 8 - 23 mg/dL 13   Creatinine 9.38 - 1.24 mg/dL 9.31   Sodium 864 - 854 mmol/L 137   Potassium 3.5 - 5.1 mmol/L 3.5   Chloride 98 - 111 mmol/L 100   CO2 22 - 32 mmol/L 25   Calcium 8.9 - 10.3 mg/dL 8.9       Latest Ref Rng & Units 06/10/2024    4:46 AM  CBC  WBC 4.0 - 10.5 K/uL 4.2   Hemoglobin 13.0 - 17.0 g/dL 87.7   Hematocrit 60.9 - 52.0 % 36.6   Platelets 150 - 400 K/uL 57     @IMAGES @  CT HEAD WO CONTRAST ( ) Result Date: 06/09/2024 EXAM: CT HEAD WITHOUT CONTRAST 06/09/2024  11:23:59 PM TECHNIQUE: CT of the head was performed  without the administration of intravenous contrast. Automated exposure control, iterative reconstruction, and/or weight based adjustment of the mA/kV was utilized to reduce the radiation dose to as low as reasonably achievable. COMPARISON: CT head 05/09/2024, mri head 05/18/24 CLINICAL HISTORY: Head trauma, minor (Age >= 65y) FINDINGS: BRAIN AND VENTRICLES: No acute hemorrhage. No evidence of acute infarct. No hydrocephalus. No extra-axial collection. No mass effect or midline shift. Patchy and confluent areas of decreased attenuation are noted throughout the deep and periventricular white matter of the cerebral hemispheres bilaterally suggestive of chronic microvascular ischemic changes. Similar appearing bilateral frontal and left cerebelalr vasogenic edema. Prior right craniotomy. ORBITS: No acute abnormality. SINUSES: No acute abnormality. SOFT TISSUES AND SKULL: No acute soft tissue abnormality. No skull fracture. IMPRESSION: 1. No acute intracranial abnormality. 2. Similar appearing bilateral frontal and left cerebelalr vasogenic edema. Electronically signed by: Morgane Naveau MD 06/09/2024 11:35 PM EST RP Workstation: [id number]   DG Hip Unilat W or Wo Pelvis 2-3 Views Right Result Date: 06/09/2024 CLINICAL DATA:  Fall with hip pain. EXAM: DG HIP (WITH OR WITHOUT PELVIS) 2-3V RIGHT COMPARISON:  None. FINDINGS: There is no evidence of hip fracture or dislocation. There is no evidence of arthropathy or other focal bone abnormality. IMPRESSION: Negative. Electronically Signed   By: Greig Pique M.D.   On: 06/09/2024 19:43   VAS US  LOWER EXTREMITY VENOUS (DVT) Result Date: 06/01/2024  Lower Venous DVT Study Patient Name:  SIDDHARTHA HOBACK [facility]  Date of Exam:   05/31/2024 Medical Rec #: [id number]             Accession #:    [id number] Date of Birth: [birth year]             Patient Gender: M Patient Age:   59 years Exam Location:  [facility] Procedure:      VAS US  LOWER EXTREMITY VENOUS (DVT) Referring  Phys: SVEN ELKS --------------------------------------------------------------------------------  Indications: Swelling.  Comparison Study: Prior negative bilateral LEV done 02/13/24 Performing Technologist: Alberta Lis RVS  Examination Guidelines: A complete evaluation includes B-mode imaging, spectral Doppler, color Doppler, and power Doppler as needed of all accessible portions of each vessel. Bilateral testing is considered an integral part of a complete examination. Limited examinations for reoccurring indications may be performed as noted. The reflux portion of the exam is performed with the patient in reverse Trendelenburg.  +-----+---------------+---------+-----------+----------+--------------+ RIGHTCompressibilityPhasicitySpontaneityPropertiesThrombus Aging +-----+---------------+---------+-----------+----------+--------------+ CFV  Full           Yes      Yes                                 +-----+---------------+---------+-----------+----------+--------------+ SFJ  Full                                                        +-----+---------------+---------+-----------+----------+--------------+   +---------+---------------+---------+-----------+----------+-----------------+ LEFT     CompressibilityPhasicitySpontaneityPropertiesThrombus Aging    +---------+---------------+---------+-----------+----------+-----------------+ CFV      Full           Yes      Yes                                    +---------+---------------+---------+-----------+----------+-----------------+  SFJ      Full                                                           +---------+---------------+---------+-----------+----------+-----------------+ FV Prox  Full           Yes      No                                     +---------+---------------+---------+-----------+----------+-----------------+ FV Mid   Partial        Yes      No                   Age Indeterminate  +---------+---------------+---------+-----------+----------+-----------------+ FV DistalPartial                                      Age Indeterminate +---------+---------------+---------+-----------+----------+-----------------+ PFV      Full           Yes      No                                     +---------+---------------+---------+-----------+----------+-----------------+ POP      Full           Yes      Yes                                    +---------+---------------+---------+-----------+----------+-----------------+ PTV      Partial                                      Age Indeterminate +---------+---------------+---------+-----------+----------+-----------------+ PERO     Partial                                      Age Indeterminate +---------+---------------+---------+-----------+----------+-----------------+ Gastroc  Full                                                           +---------+---------------+---------+-----------+----------+-----------------+     Summary: RIGHT: - No evidence of common femoral vein obstruction.   LEFT: - Findings consistent with age indeterminate deep vein thrombosis involving the left femoral vein, left posterior tibial veins, and left peroneal veins.  - No cystic structure found in the popliteal fossa.  *See table(s) above for measurements and observations. Electronically signed by Debby Robertson on 06/01/2024 at 2:12:59 PM.    Final    DG CHEST PORT 1 VIEW Result Date: 05/23/2024 CLINICAL DATA:  Cough EXAM: PORTABLE CHEST 1 VIEW COMPARISON:  CT chest dated 05/06/2024 FINDINGS: Normal lung volumes. Bibasilar hazy and patchy opacities, right-greater-than-left. Bilateral upper lung lucency. No pleural effusion or pneumothorax. The  heart size and mediastinal contours are within normal limits. No acute osseous abnormality. IMPRESSION: 1. Bibasilar hazy and patchy opacities, right-greater-than-left, suspicious for aspiration or  pneumonia. 2. Bilateral upper lung lucency, likely related to emphysema. Electronically Signed   By: Limin  Xu M.D.   On: 05/23/2024 17:38   US  RENAL Result Date: 05/21/2024 CLINICAL DATA:  Urinary retention EXAM: RENAL / URINARY TRACT ULTRASOUND COMPLETE COMPARISON:  CT 05/06/2024 FINDINGS: Right Kidney: Renal measurements: 10.3 x 4.3 x 4 cm = volume: 94 mL. Echogenicity within normal limits. No mass or hydronephrosis visualized. Left Kidney: Renal measurements: 10 x 4.5 x 3.6 cm = volume: 83 mL. Echogenicity within normal limits. No mass or hydronephrosis visualized. Bladder: Distended urinary bladder. Other: None. IMPRESSION: Negative renal ultrasound.  Distended urinary bladder. Electronically Signed   By: Luke Bun M.D.   On: 05/21/2024 21:22   MR BRAIN W WO CONTRAST Result Date: 05/18/2024 EXAM: MRI BRAIN WITH AND WITHOUT CONTRAST 05/18/2024 04:45:00 PM TECHNIQUE: Multiplanar multisequence MRI of the head/brain was performed with and without the administration of intravenous contrast. COMPARISON: MRI May 05, 2024 CLINICAL HISTORY: Metastatic disease evaluation; 3T SRS Protocol for radiation treatment planning. FINDINGS: BRAIN AND VENTRICLES: No acute infarct. No acute intracranial hemorrhage. No mass effect or midline shift. No hydrocephalus. Similar left frontal lobe T2 hyperintensity, likely gliosis. Normal flow voids. No substantial change in approximately 15 x 12 mm enhancing lesion in the right frontal lobe and 17 mm lesion in the left cerebellum. No new enhancing lesions identified. Axial postcontrast imaging is limited by motion. The surrounding T2 FLAIR hyperintense edema is improved. ORBITS: No acute abnormality. SINUSES: No acute abnormality. BONES AND SOFT TISSUES: Normal bone marrow signal and enhancement. No acute soft tissue abnormality. IMPRESSION: 1. 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 Improved surrounding edema. 2. No new  enhancing lesions identified. Electronically signed by: Gilmore Molt MD 05/18/2024 11:27 PM EDT RP Workstation: [id number]    Assessment and plan- Patient is a 68 y.o. male ***   Thank you for this kind referral and the opportunity to participate in the care of this  Patient   Visit Diagnosis 1. Cystitis   2. Acute urinary retention   3. Generalized weakness   4. Malignant neoplasm metastatic to brain [facility])     Dr. Annah Skene, MD, MPH [facility] [phone] 06/10/2024

## 2024-06-10 NOTE — Assessment & Plan Note (Signed)
 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  Not on any chemotherapy at this time. -Continuing home Decadron and Keppra -Palliative care consult

## 2024-06-10 NOTE — Progress Notes (Signed)
 Progress Note   Patient: Roger [name] FMW:[id number] DOB: 1955/10/02 DOA: 06/09/2024     0 DOS: the patient was seen and examined on 06/10/2024   Brief hospital course: Partly taken from H&P.  Roger [name] is a 68 y.o. 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  Patient recently had a long and complicated admission from 10/6 - 11/11 due to leptomeningeal carcinomatosis, bilateral lower extremity pain, NSCLC metastasized to brain.  Patient is s/p of craniotomy with brain tumor resection.  Patient is s/p of palliative radiation therapy, currently not on chemotherapy.  Patient was discharged to CIR on 10/23 and then home on 11/11.  Since home patient has progressive weakness and now unable to walk.  Had a fall on 11/12.  SABRA  He has difficulty urinating, found to have acute urinary retention with 478 mL in the ED. Foley catheter was placed.  On presentation vitals stable, labs with  WBC 3.8, GFR> 60, potassium 3.4, pro-BNP 502 UA (cloudy appearance, negative leukocyte, positive nitrite, rare bacteria, WBC 0-5)  X-ray of her right hip/pelvis is negative for bony fracture.  CT head with no acute intracranial abnormality, did show similar appearing bilateral frontal and left cerebral vasogenic edema.  11/18: Vitals with mild tachycardia, appears confused.  Monitor with frequent PVCs.  Unable to reach family at this time.  Message sent to his radiation oncologist Dr. [name] and pulmonologist Dr. Aleskerov for further clarification.  No medical oncologist attached to his chart, not sure whether he is a candidate for any treatment.  Apparently has not received any chemotherapy yet.  Nursing concern of coughing with eating so swallow evaluation ordered.  Assessment and  Plan: * Generalized weakness Falls. Patient with worsening generalized weakness and unable to walk or do ADLs now.  Recently discharged from CIR when he was able to walk some with the help of walker before discharge.  History of stage IV lung cancer with progression, recent craniotomy and palliative radiation to brain and cervical spine.  Also has leptomeningeal spread.  Palliative care was consulted. PT and OT evaluation.  Acute urinary retention Patient with history of prior urinary retention which was initially thought to be due to cauda equina, later he was able to void and discharged home without Foley.  Now back with urinary retention requiring Foley catheter which was placed in ED. Received 1 dose of ceftriaxone which was not continued, recently treated for pansensitive E. coli UTI. -Repeat blood cultures obtained and pending -Continue with Flomax -Patient need to follow-up with outpatient urology for voiding trial  Benign hypertension Blood pressure within goal. -Monitor closely as starting on low-dose metoprolol for tachycardia.   Chronic systolic CHF (congestive heart failure) (HCC)  2D echo on 02/23/2024 showed EF of 45%.  His on torsemide and spironolactone are on hold from recent admission.  Patient has mild leg edema, but BNP is only 502.  No SOB.  - Monitor volume status closely, might need to restart home diuretics  COPD (chronic obstructive pulmonary disease) (HCC) No current concern of exacerbation, no wheezing. -Continue with bronchodilators as needed  HLD (hyperlipidemia) - Continue Lipitor  Hypokalemia Potassium improved to 3.5 with repletion, magnesium of 1.7 -Giving some more potassium and magnesium - Patient was having PVCs-Will try to keep potassium at 4 and magnesium above 2.  Chronic kidney disease, stage 3a (  HCC) Renal function normal with GFR above 60.  CKD stage IIIa was mentioned in his chart. -Monitor renal function  Thrombocytopenia Seems  chronic, platelets of 57 today. Likely related to his advanced malignancy -Continue to monitor  Malignant neoplasm of upper lobe of left lung [facility]) Patient with history of non-small cell lung cancer with mets to brain, s/p craniotomy and finished palliative radiation therapy.  Not on any chemotherapy at this time. -Continuing home Decadron and Keppra -Palliative care consult  History of DVT (deep vein thrombosis) - Continue Eliquis - Needed close monitoring due to thrombocytopenia.   Subjective: Patient was seen and examined today.  Appears lethargic and keeps repeating that I am not smoking anymore.  Appears confused and oriented to self only.  Some nursing concern of coughing with eating-swallow evaluation ordered.  Physical Exam: Vitals:   06/10/24 0500 06/10/24 0729 06/10/24 1032 06/10/24 1144  BP:  (!) 145/104 119/88 129/87  Pulse:  100 (!) 141 (!) 113  Resp:  14 16 18   Temp:  98.7 F (37.1 C) 98.2 F (36.8 C) 98.6 F (37 C)  TempSrc:  Oral Oral Oral  SpO2:  93% 94% 95%  Weight: 89.1 kg     Height:       General.  Chronically ill-appearing gentleman, in no acute distress. Pulmonary.  Lungs clear bilaterally, normal respiratory effort. CV.  Mild sinus tachycardia Abdomen.  Soft, nontender, nondistended, BS positive. CNS.  Alert and oriented to self only.  No focal neurologic deficit. Extremities.  Trace LE edema pulses intact and symmetrical. Psychiatry.  Judgment and insight appears impaired.  Data Reviewed: Prior data reviewed  Family Communication: Tried calling wife multiple times with no response, no message left due to generic voicemail.  Disposition: Status is: Observation The patient will require care spanning > 2 midnights and should be moved to inpatient because: Severity of illness  Planned Discharge Destination: To be determined  DVT prophylaxis.  Eliquis Time spent: 50 minutes  This record has been created using Conservation officer, historic buildings.  Errors have been sought and corrected,but may not always be located. Such creation errors do not reflect on the standard of care.   Author: Amaryllis Dare, MD 06/10/2024 1:00 PM  For on call review [email or web address].

## 2024-06-10 NOTE — Progress Notes (Signed)
 OT Cancellation Note  Patient Details Name: Roger [name] MRN: [id number] DOB: 1956-04-15   Cancelled Treatment:    Reason Eval/Treat Not Completed: Other (comment). Order received, chart reviewed and eval attempted. Nurse reporting pt with high HR at rest and to hold at this time. Re-attempt in PM with palliative consulted and stating to hold on all services until further discussion with family on GOC. Will hold evaluation this date and follow up next visit if appropriate.  Roxie Kreeger E Chrismon 06/10/2024, 2:28 PM

## 2024-06-10 NOTE — Assessment & Plan Note (Signed)
 Seems chronic, platelets of 57 today. Likely related to his advanced malignancy -Continue to monitor

## 2024-06-10 NOTE — Assessment & Plan Note (Signed)
 Blood pressure within goal. -Monitor closely as starting on low-dose metoprolol for tachycardia.

## 2024-06-10 NOTE — Assessment & Plan Note (Signed)
 Patient with history of prior urinary retention which was initially thought to be due to cauda equina, later he was able to void and discharged home without Foley.  Now back with urinary retention requiring Foley catheter which was placed in ED. Received 1 dose of ceftriaxone which was not continued, recently treated for pansensitive E. coli UTI. -Repeat blood cultures obtained and pending -Continue with Flomax -Patient need to follow-up with outpatient urology for voiding trial

## 2024-06-10 NOTE — Plan of Care (Signed)

## 2024-06-10 NOTE — Hospital Course (Addendum)
 Partly taken from H&P.  Roger [name] is a 68 y.o. 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  Patient recently had a long and complicated admission from 10/6 - 11/11 due to leptomeningeal carcinomatosis, bilateral lower extremity pain, NSCLC metastasized to brain.  Patient is s/p of craniotomy with brain tumor resection.  Patient is s/p of palliative radiation therapy, currently not on chemotherapy.  Patient was discharged to CIR on 10/23 and then home on 11/11.  Since home patient has progressive weakness and now unable to walk.  Had a fall on 11/12.  SABRA  He has difficulty urinating, found to have acute urinary retention with 478 mL in the ED. Foley catheter was placed.  On presentation vitals stable, labs with  WBC 3.8, GFR> 60, potassium 3.4, pro-BNP 502 UA (cloudy appearance, negative leukocyte, positive nitrite, rare bacteria, WBC 0-5)  X-ray of her right hip/pelvis is negative for bony fracture.  CT head with no acute intracranial abnormality, did show similar appearing bilateral frontal and left cerebral vasogenic edema.  11/18: Vitals with mild tachycardia, appears confused.  Monitor with frequent PVCs.  Unable to reach family at this time.  Message sent to his radiation oncologist Dr. [name] and pulmonologist Dr. Aleskerov for further clarification.  No medical oncologist attached to his chart, not sure whether he is a candidate for any treatment.  Apparently has not received any chemotherapy yet.  Nursing concern of coughing with eating so swallow evaluation ordered.

## 2024-06-10 NOTE — Evaluation (Signed)
 Physical Therapy Evaluation Patient Details Name: Roger [name] MRN: [id number] DOB: 1955-08-23 Today's Date: 06/10/2024  History of Present Illness  Roger [name] is a 68 y.o. 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 Patient required max A to perform supine to sitting edge of bed. Max A to return to supine and for positioning in bed. Patient is confused throughout session. He will continue to benefit from skilled PT to improve functional mobility and independence.          If plan is discharge home, recommend the following: Two people to help with walking and/or transfers;A lot of help with bathing/dressing/bathroom   Can travel by private vehicle   No    Equipment Recommendations Other (comment) (TBD)  Recommendations for Other Services       Functional Status Assessment Patient has had a recent decline in their functional status and demonstrates the ability to make significant improvements in function in a reasonable and predictable amount of time.     Precautions / Restrictions Precautions Precautions: Fall Recall of Precautions/Restrictions: Impaired Restrictions Weight Bearing Restrictions Per Provider Order: No      Mobility  Bed Mobility Overal bed mobility: Needs Assistance Bed Mobility: Supine to Sit, Sit to Supine     Supine to sit: Max assist, HOB elevated Sit to supine: Max assist   General bed mobility comments: patient able to sit unsupported but has low tolerance, returning self to side lying.    Transfers                   General transfer comment: unable at this time    Ambulation/Gait               General Gait Details:  unable  Stairs            Wheelchair Mobility     Tilt Bed    Modified Rankin (Stroke Patients Only)       Balance Overall balance assessment: Needs assistance Sitting-balance support: Feet supported Sitting balance-Leahy Scale: Fair                                       Pertinent Vitals/Pain Pain Assessment Pain Assessment: PAINAD Breathing: normal Negative Vocalization: occasional moan/groan, low speech, negative/disapproving quality Facial Expression: facial grimacing Body Language: tense, distressed pacing, fidgeting Consolability: distracted or reassured by voice/touch PAINAD Score: 5 Pain Intervention(s): Monitored during session, Repositioned    Home Living Family/patient expects to be discharged to:: Private residence Living Arrangements: Spouse/significant other Available Help at Discharge: Family;Available 24 hours/day Type of Home: House Home Access: Level entry       Home Layout: One level Home Equipment: Cane - single point Additional Comments: unsure- information taken from prior visit 3 weeks ago    Prior Function Prior Level of Function : Working/employed;History of Falls (last six months);Needs assist       Physical Assist : Mobility (physical);ADLs (physical) Mobility (physical): Gait;Transfers;Bed mobility   Mobility Comments: patient is poor historian, unable to determine how he was recently functioning.       Extremity/Trunk Assessment   Upper Extremity Assessment Upper Extremity Assessment: Generalized weakness    Lower  Extremity Assessment Lower Extremity Assessment: Generalized weakness    Cervical / Trunk Assessment Cervical / Trunk Assessment: Normal  Communication   Communication Communication: Impaired Factors Affecting Communication: Difficulty expressing self    Cognition Arousal: Alert Behavior During Therapy: Flat affect   PT - Cognitive impairments: No family/caregiver present to  determine baseline, History of cognitive impairments                         Following commands: Impaired Following commands impaired: Follows one step commands inconsistently     Cueing Cueing Techniques: Verbal cues, Gestural cues, Tactile cues     General Comments      Exercises     Assessment/Plan    PT Assessment Patient needs continued PT services  PT Problem List Decreased strength;Decreased activity tolerance;Decreased balance;Decreased mobility;Decreased cognition;Pain       PT Treatment Interventions DME instruction;Gait training;Functional mobility training;Therapeutic activities;Therapeutic exercise;Balance training;Patient/family education;Cognitive remediation;Neuromuscular re-education    PT Goals (Current goals can be found in the Care Plan section)  Acute Rehab PT Goals Patient Stated Goal: none stated PT Goal Formulation: Patient unable to participate in goal setting Time For Goal Achievement: 06/22/24    Frequency Min 2X/week     Co-evaluation               AM-PAC PT 6 Clicks Mobility  Outcome Measure Help needed turning from your back to your side while in a flat bed without using bedrails?: A Lot Help needed moving from lying on your back to sitting on the side of a flat bed without using bedrails?: A Lot Help needed moving to and from a bed to a chair (including a wheelchair)?: Total Help needed standing up from a chair using your arms (e.g., wheelchair or bedside chair)?: Total Help needed to walk in hospital room?: Total Help needed climbing 3-5 steps with a railing? : Total 6 Click Score: 8    End of Session   Activity Tolerance: Patient limited by pain;Patient limited by lethargy Patient left: in bed;with call bell/phone within reach;with bed alarm set Nurse Communication: Mobility status PT Visit Diagnosis: Other abnormalities of gait and mobility (R26.89);Muscle weakness (generalized) (M62.81);Pain Pain - part of body:   (generalized)    Time: 9099-9080 PT Time Calculation (min) (ACUTE ONLY): 19 min   Charges:   PT Evaluation $PT Eval Moderate Complexity: 1 Mod   PT General Charges $$ ACUTE PT VISIT: 1 Visit        Deontrey Massi, PT, GCS 06/10/24,9:42 AM

## 2024-06-10 NOTE — Assessment & Plan Note (Signed)
 2D echo on 02/23/2024 showed EF of 45%.  His on torsemide and spironolactone are on hold from recent admission.  Patient has mild leg edema, but BNP is only 502.  No SOB.  - Monitor volume status closely, might need to restart home diuretics

## 2024-06-10 NOTE — Consult Note (Signed)
 Palliative Medicine [facility] at [facility] Telephone:([phone]) [phone] [phone]) [phone]   Name: Roger [name] Date: 06/10/2024 MRN: [id number]  DOB: 17-Jul-1956  Patient Care Team: [name], MD as PCP - General (Internal Medicine) Verdene Gills, RN as Oncology Nurse Navigator    REASON FOR CONSULTATION: Roger [name] is a 68 y.o. 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  Patient's status post craniotomy with tumor resection followed by XRT.  Patient was hospitalized 04/28/2024 to 05/15/2024 and was discharged to inpatient rehab where he stayed until 06/03/2024.  Patient now readmitted 06/09/2024 with weakness.  Palliative care consulted to address goals.  SOCIAL HISTORY:     reports that he has quit smoking. His smoking use included cigarettes. He has a 47 pack-year smoking history. He has never used smokeless tobacco. He reports that he does not drink alcohol  and does not use drugs.  Patient married lives at home with his wife.  He has a son who lives in [place].  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   right lateral hand, treating with 5FU/calcipotriene   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   vertex  scalp, treating with 5FU/calcipotriene    PAST SURGICAL HISTORY:  Past Surgical History:  Procedure Laterality Date   APPLICATION OF CRANIAL NAVIGATION Right 05/08/2024   Procedure: COMPUTER-ASSISTED NAVIGATION, FOR CRANIAL PROCEDURE;  Surgeon: [name] Dino HERO, MD;  Location: [facility] OR;  Service: Neurosurgery;  Laterality: Right;   COLON SURGERY     COLONOSCOPY WITH PROPOFOL  N/A 10/20/2020   Procedure: COLONOSCOPY WITH PROPOFOL ;  Surgeon: Toledo, Ladell POUR, MD;  Location: [facility] ENDOSCOPY;  Service: Gastroenterology;  Laterality: N/A;   CRANIOTOMY Right 05/08/2024   Procedure: CRANIOTOMY TUMOR EXCISION;  Surgeon: [name] Dino HERO, MD;  Location: [facility] OR;  Service: Neurosurgery;  Laterality: Right;    HEMATOLOGY/ONCOLOGY HISTORY:  Oncology History   No history exists.    ALLERGIES:  has no known allergies.  MEDICATIONS:  Current Facility-Administered Medications  Medication Dose Route Frequency Provider Last Rate Last Admin   acetaminophen (TYLENOL) tablet 650 mg  650 mg Oral Q6H PRN Niu, Xilin, MD       albuterol (PROVENTIL) (2.5 MG/3ML) 0.083% nebulizer solution 2.5 mg  2.5 mg Inhalation Q4H PRN Niu, Xilin, MD       apixaban WINN) tablet 5 mg  5 mg Oral BID Niu, Xilin, MD   5 mg at 06/10/24 9157   atorvastatin (LIPITOR) tablet 80 mg  80 mg Oral Daily Niu, Xilin, MD   80 mg at 06/10/24 9157   Chlorhexidine Gluconate Cloth 2 % PADS 6 each  6 each Topical Daily Amin, Sumayya, MD       cholecalciferol (VITAMIN  D3) 25 MCG (1000 UNIT) tablet 1,000 Units  1,000 Units Oral Daily Niu, Xilin, MD   1,000 Units at 06/10/24 9157   clonazePAM (KLONOPIN) tablet 0.5 mg  0.5 mg Oral TID PRN Niu, Xilin, MD   0.5 mg at 06/10/24 9661   dexamethasone (DECADRON) tablet 4 mg  4 mg Oral Daily Niu, Xilin, MD   4 mg at 06/10/24 9157   dextromethorphan-guaiFENesin (MUCINEX DM) 30-600 MG per 12 hr tablet 1 tablet  1 tablet Oral BID PRN Niu, Xilin, MD       DULoxetine (CYMBALTA) DR capsule 60 mg  60 mg Oral Daily  Niu, Xilin, MD   60 mg at 06/10/24 0841   fluticasone furoate-vilanterol (BREO ELLIPTA) 100-25 MCG/ACT 1 puff  1 puff Inhalation Daily Niu, Xilin, MD   1 puff at 06/10/24 0848   hydrALAZINE (APRESOLINE) injection 5 mg  5 mg Intravenous Q2H PRN Niu, Xilin, MD       ipratropium (ATROVENT) 0.03 % nasal spray 1 spray  1 spray Nasal BID PRN Niu, Xilin, MD       levETIRAcetam (KEPPRA) tablet 500 mg  500 mg Oral BID Niu, Xilin, MD   500 mg at 06/10/24 9157   metoprolol tartrate (LOPRESSOR) tablet 12.5 mg  12.5 mg Oral BID Amin, Sumayya, MD   12.5 mg at 06/10/24 1311   montelukast (SINGULAIR) tablet 10 mg  10 mg Oral Daily Niu, Xilin, MD   10 mg at 06/10/24 0842   ondansetron (ZOFRAN) injection 4 mg  4 mg Intravenous Q8H PRN Niu, Xilin, MD       oxyCODONE (Oxy IR/ROXICODONE) immediate release tablet 10 mg  10 mg Oral Q4H PRN Niu, Xilin, MD   10 mg at 06/10/24 0338   pantoprazole (PROTONIX) EC tablet 40 mg  40 mg Oral Daily Niu, Xilin, MD   40 mg at 06/10/24 0842   polyethylene glycol (MIRALAX / GLYCOLAX) packet 17 g  17 g Oral BID Niu, Xilin, MD   17 g at 06/10/24 0843   pregabalin (LYRICA) capsule 150 mg  150 mg Oral TID Niu, Xilin, MD   150 mg at 06/10/24 0841   senna-docusate (Senokot-S) tablet 1 tablet  1 tablet Oral QHS Niu, Xilin, MD       tamsulosin (FLOMAX) capsule 0.4 mg  0.4 mg Oral QPC supper Niu, Xilin, MD       tiZANidine (ZANAFLEX) tablet 2 mg  2 mg Oral TID Niu, Xilin, MD   2 mg at 06/10/24 0847   traZODone (DESYREL) tablet 50 mg  50 mg Oral QHS PRN Niu, Xilin, MD   50 mg at 06/10/24 0338    VITAL SIGNS: BP 125/81 (BP Location: Right Arm)   Pulse (!) 116   Temp 98.4 F (36.9 C) (Oral)   Resp 18   Ht 5' 7 (1.702 m)   Wt 196 lb 6.9 oz (89.1 kg)   SpO2 95%   BMI 30.77 kg/m  Filed Weights   06/09/24 1808 06/09/24 2352 06/10/24 0500  Weight: 200 lb 9.9 oz (91 kg) 189 lb 9.5 oz (86 kg) 196 lb 6.9 oz (89.1 kg)    Estimated body mass index is 30.77 kg/m as calculated from the  following:   Height as of this encounter: 5' 7 (1.702 m).   Weight as of this encounter: 196 lb 6.9 oz (89.1 kg).  LABS: CBC:    Component Value Date/Time   WBC 4.2 06/10/2024 0446   HGB 12.2 (L) 06/10/2024 0446  HCT 36.6 (L) 06/10/2024 0446   PLT 57 (L) 06/10/2024 0446   MCV 96.3 06/10/2024 0446   NEUTROABS 3.2 06/09/2024 1812   LYMPHSABS 0.2 (L) 06/09/2024 1812   MONOABS 0.2 06/09/2024 1812   EOSABS 0.0 06/09/2024 1812   BASOSABS 0.0 06/09/2024 1812   Comprehensive Metabolic Panel:    Component Value Date/Time   NA 137 06/10/2024 0446   K 3.5 06/10/2024 0446   CL 100 06/10/2024 0446   CO2 25 06/10/2024 0446   BUN 13 06/10/2024 0446   CREATININE 0.68 06/10/2024 0446   GLUCOSE 90 06/10/2024 0446   CALCIUM 8.9 06/10/2024 0446   AST 31 06/09/2024 1812   ALT 35 06/09/2024 1812   ALKPHOS 60 06/09/2024 1812   BILITOT 0.5 06/09/2024 1812   PROT 5.5 (L) 06/09/2024 1812   ALBUMIN 3.3 (L) 06/09/2024 1812    RADIOGRAPHIC STUDIES: CT HEAD WO CONTRAST ( ) Result Date: 06/09/2024 EXAM: CT HEAD WITHOUT CONTRAST 06/09/2024 11:23:59 PM TECHNIQUE: CT of the head was performed without the administration of intravenous contrast. Automated exposure control, iterative reconstruction, and/or weight based adjustment of the mA/kV was utilized to reduce the radiation dose to as low as reasonably achievable. COMPARISON: CT head 05/09/2024, mri head 05/18/24 CLINICAL HISTORY: Head trauma, minor (Age >= 65y) FINDINGS: BRAIN AND VENTRICLES: No acute hemorrhage. No evidence of acute infarct. No hydrocephalus. No extra-axial collection. No mass effect or midline shift. Patchy and confluent areas of decreased attenuation are noted throughout the deep and periventricular white matter of the cerebral hemispheres bilaterally suggestive of chronic microvascular ischemic changes. Similar appearing bilateral frontal and left cerebelalr vasogenic edema. Prior right craniotomy. ORBITS: No acute abnormality.  SINUSES: No acute abnormality. SOFT TISSUES AND SKULL: No acute soft tissue abnormality. No skull fracture. IMPRESSION: 1. No acute intracranial abnormality. 2. Similar appearing bilateral frontal and left cerebelalr vasogenic edema. Electronically signed by: Morgane Naveau MD 06/09/2024 11:35 PM EST RP Workstation: [id number]   DG Hip Unilat W or Wo Pelvis 2-3 Views Right Result Date: 06/09/2024 CLINICAL DATA:  Fall with hip pain. EXAM: DG HIP (WITH OR WITHOUT PELVIS) 2-3V RIGHT COMPARISON:  None. FINDINGS: There is no evidence of hip fracture or dislocation. There is no evidence of arthropathy or other focal bone abnormality. IMPRESSION: Negative. Electronically Signed   By: Greig Pique M.D.   On: 06/09/2024 19:43   VAS US  LOWER EXTREMITY VENOUS (DVT) Result Date: 06/01/2024  Lower Venous DVT Study Patient Name:  MICAH BARNIER [facility]  Date of Exam:   05/31/2024 Medical Rec #: [id number]             Accession #:    [id number] Date of Birth: 07/23/1956             Patient Gender: M Patient Age:   39 years Exam Location:  [facility] Procedure:      VAS US  LOWER EXTREMITY VENOUS (DVT) Referring Phys: SVEN ELKS --------------------------------------------------------------------------------  Indications: Swelling.  Comparison Study: Prior negative bilateral LEV done 02/13/24 Performing Technologist: Alberta Lis RVS  Examination Guidelines: A complete evaluation includes B-mode imaging, spectral Doppler, color Doppler, and power Doppler as needed of all accessible portions of each vessel. Bilateral testing is considered an integral part of a complete examination. Limited examinations for reoccurring indications may be performed as noted. The reflux portion of the exam is performed with the patient in reverse Trendelenburg.  +-----+---------------+---------+-----------+----------+--------------+ RIGHTCompressibilityPhasicitySpontaneityPropertiesThrombus Aging  +-----+---------------+---------+-----------+----------+--------------+ CFV  Full           Yes  Yes                                 +-----+---------------+---------+-----------+----------+--------------+ SFJ  Full                                                        +-----+---------------+---------+-----------+----------+--------------+   +---------+---------------+---------+-----------+----------+-----------------+ LEFT     CompressibilityPhasicitySpontaneityPropertiesThrombus Aging    +---------+---------------+---------+-----------+----------+-----------------+ CFV      Full           Yes      Yes                                    +---------+---------------+---------+-----------+----------+-----------------+ SFJ      Full                                                           +---------+---------------+---------+-----------+----------+-----------------+ FV Prox  Full           Yes      No                                     +---------+---------------+---------+-----------+----------+-----------------+ FV Mid   Partial        Yes      No                   Age Indeterminate +---------+---------------+---------+-----------+----------+-----------------+ FV DistalPartial                                      Age Indeterminate +---------+---------------+---------+-----------+----------+-----------------+ PFV      Full           Yes      No                                     +---------+---------------+---------+-----------+----------+-----------------+ POP      Full           Yes      Yes                                    +---------+---------------+---------+-----------+----------+-----------------+ PTV      Partial                                      Age Indeterminate +---------+---------------+---------+-----------+----------+-----------------+ PERO     Partial                                      Age Indeterminate  +---------+---------------+---------+-----------+----------+-----------------+ Gastroc  Full                                                           +---------+---------------+---------+-----------+----------+-----------------+  Summary: RIGHT: - No evidence of common femoral vein obstruction.   LEFT: - Findings consistent with age indeterminate deep vein thrombosis involving the left femoral vein, left posterior tibial veins, and left peroneal veins.  - No cystic structure found in the popliteal fossa.  *See table(s) above for measurements and observations. Electronically signed by Debby Robertson on 06/01/2024 at 2:12:59 PM.    Final    DG CHEST PORT 1 VIEW Result Date: 05/23/2024 CLINICAL DATA:  Cough EXAM: PORTABLE CHEST 1 VIEW COMPARISON:  CT chest dated 05/06/2024 FINDINGS: Normal lung volumes. Bibasilar hazy and patchy opacities, right-greater-than-left. Bilateral upper lung lucency. No pleural effusion or pneumothorax. The heart size and mediastinal contours are within normal limits. No acute osseous abnormality. IMPRESSION: 1. Bibasilar hazy and patchy opacities, right-greater-than-left, suspicious for aspiration or pneumonia. 2. Bilateral upper lung lucency, likely related to emphysema. Electronically Signed   By: Limin  Xu M.D.   On: 05/23/2024 17:38   US  RENAL Result Date: 05/21/2024 CLINICAL DATA:  Urinary retention EXAM: RENAL / URINARY TRACT ULTRASOUND COMPLETE COMPARISON:  CT 05/06/2024 FINDINGS: Right Kidney: Renal measurements: 10.3 x 4.3 x 4 cm = volume: 94 mL. Echogenicity within normal limits. No mass or hydronephrosis visualized. Left Kidney: Renal measurements: 10 x 4.5 x 3.6 cm = volume: 83 mL. Echogenicity within normal limits. No mass or hydronephrosis visualized. Bladder: Distended urinary bladder. Other: None. IMPRESSION: Negative renal ultrasound.  Distended urinary bladder. Electronically Signed   By: Luke Bun M.D.   On: 05/21/2024 21:22   MR BRAIN W WO  CONTRAST Result Date: 05/18/2024 EXAM: MRI BRAIN WITH AND WITHOUT CONTRAST 05/18/2024 04:45:00 PM TECHNIQUE: Multiplanar multisequence MRI of the head/brain was performed with and without the administration of intravenous contrast. COMPARISON: MRI May 05, 2024 CLINICAL HISTORY: Metastatic disease evaluation; 3T SRS Protocol for radiation treatment planning. FINDINGS: BRAIN AND VENTRICLES: No acute infarct. No acute intracranial hemorrhage. No mass effect or midline shift. No hydrocephalus. Similar left frontal lobe T2 hyperintensity, likely gliosis. Normal flow voids. No substantial change in approximately 15 x 12 mm enhancing lesion in the right frontal lobe and 17 mm lesion in the left cerebellum. No new enhancing lesions identified. Axial postcontrast imaging is limited by motion. The surrounding T2 FLAIR hyperintense edema is improved. ORBITS: No acute abnormality. SINUSES: No acute abnormality. BONES AND SOFT TISSUES: Normal bone marrow signal and enhancement. No acute soft tissue abnormality. IMPRESSION: 1. 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 Improved surrounding edema. 2. No new enhancing lesions identified. Electronically signed by: Gilmore Molt MD 05/18/2024 11:27 PM EDT RP Workstation: [id number]    PERFORMANCE STATUS (ECOG) : 4 - Bedbound  Review of Systems Unable to complete  Physical Exam General: Frail appearing Pulmonary: Unlabored Abdomen: soft, nontender, + bowel sounds GU: no suprapubic tenderness Extremities: no edema, no joint deformities Skin: no rashes Neurological: Weakness, situationally confused  IMPRESSION: Patient with NSCLC with leptomeningeal carcinomatosis status post XRT who had a prolonged hospitalization in [place] followed by CIR and now was readmitted with progressive weakness.  Patient alone in the room.  He is alert and oriented x 2.  Has some situational confusion but knows that he has lung cancer  that has spread everywhere.  With patient's permission, I attempted to call his wife but was unable to reach her after several attempts.  Voicemail left.  Patient provided me the number to call his son, Regnald [phone]). Per son, he verbalized knowledge that patient has cancer and  that prognosis is poor.  However, he admits to having a strained relationship with both his father and stepmother.  To the son's knowledge, patient does not have a healthcare power of attorney and patient's wife would be the decision-maker. Will continue to try to reach patient's wife to discuss goals.   PLAN: -Continue current scope of treatment -Would recommend hospice involvement   Time Total: 45 minutes  Visit consisted of counseling and education dealing with the complex and emotionally intense issues of symptom management and palliative care in the setting of serious and potentially life-threatening illness.Greater than 50%  of this time was spent counseling and coordinating care related to the above assessment and plan.  Signed by: Fonda Mower, PhD, NP-C

## 2024-06-10 NOTE — Assessment & Plan Note (Signed)
 Potassium improved to 3.5 with repletion, magnesium of 1.7 -Giving some more potassium and magnesium - Patient was having PVCs-Will try to keep potassium at 4 and magnesium above 2.

## 2024-06-10 NOTE — Progress Notes (Signed)
 ENCOUNTER: Patient Class :No patient class for patient encounter Department: [facility] [address] [name] [address]  PATIENT: Patient Demographics      Name Patient ID SSN Gender Identity Birth Date   Roger [name], Roger [name] [id number] Male 2056/06/20 (68 yrs)          Address Phone Email       [address] [name] [address] [phone] [email or web address]) [email or web address]            [facility] Caucasian/White             Reg Status PCP Date Last Verified Next Review Date     Verified [name] Ophelia Marinell DOUGLAS [phone] 06/04/24 07/04/24           Marital Status Religion Language       Married Unknown-Patient Declined English              EMERGENCY CONTACT: Name Relationship Lgl Grd Work Administrator, Sports  1. Bevill,LYNN Son or Daughter    [phone]  2. Damas,ANITA Spouse    [phone]    GUARANTOR: There is no guarantor information entered for this encounter.  COVERAGE: Primary Visit Coverage      Payer Plan Group Number Group Name Payer Phone Plan Phone   No coverage found                Secondary Visit Coverage      Payer Plan Group Number Group Name Payer Phone Plan Phone   No coverage found                Primary Coverage      Payer Plan Group Number Group Name Payer Phone Plan Phone   [employer] ADVANT[employer] [facility] ADVANTAGE               Primary Subscriber      Subscriber ID Subscriber Name [facility] Subscriber Address   [id number] [name],Roger C [id number] [address]      [place], [name] [address]           Secondary Coverage      Payer Plan Group Number Group Name Payer Phone Plan Phone   No coverage found

## 2024-06-11 DIAGNOSIS — Z515 Encounter for palliative care: Secondary | ICD-10-CM | POA: Diagnosis not present

## 2024-06-11 DIAGNOSIS — R531 Weakness: Secondary | ICD-10-CM | POA: Diagnosis not present

## 2024-06-11 NOTE — Evaluation (Signed)
 Clinical/Bedside Swallow Evaluation Patient Details  Name: Roger Black MRN: [id number] Date of Birth: [birth year]  Today's Date: 06/11/2024 Time: SLP Start Time (ACUTE ONLY): 1045 SLP Stop Time (ACUTE ONLY): 1145 SLP Time Calculation (min) (ACUTE ONLY): 60 min  Past Medical History:  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   right lateral hand, treating with 5FU/calcipotriene   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   vertex scalp, treating with 5FU/calcipotriene   Past Surgical History:  Past Surgical History:  Procedure Laterality Date   APPLICATION OF CRANIAL NAVIGATION Right 05/08/2024   Procedure: COMPUTER-ASSISTED NAVIGATION, FOR CRANIAL PROCEDURE;  Surgeon: [name] Dino HERO, MD;  Location: [facility] OR;  Service: Neurosurgery;  Laterality: Right;   COLON SURGERY     COLONOSCOPY WITH PROPOFOL  N/A 10/20/2020   Procedure: COLONOSCOPY WITH PROPOFOL ;  Surgeon: Toledo, Ladell POUR, MD;  Location: [facility] ENDOSCOPY;  Service: Gastroenterology;  Laterality: N/A;   CRANIOTOMY Right 05/08/2024   Procedure: CRANIOTOMY TUMOR EXCISION;  Surgeon: [name] Dino HERO, MD;  Location: [facility] OR;  Service: Neurosurgery;  Laterality: Right;   HPI:  Pt is a 68 y.o. 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   Patient's status post  craniotomy with tumor resection followed by XRT.   Patient was hospitalized 04/28/2024 to 05/15/2024 and was discharged to inpatient rehab where he stayed until 06/03/2024.  Patient now readmitted 06/09/2024 with weakness and Fall.  Palliative care consulted to address goals.    Head CT: No acute intracranial abnormality.  2. Similar appearing bilateral frontal and left cerebelalr vasogenic edema.  No CXR.    Assessment / Plan / Recommendation  Clinical Impression   Pt seen for BSE this morning per MD request. pt awake, verbal but severaly Confused w/ tangential thoughts/responses. He required [facility] redirection to po tasks and to complete basic 1-step commands. He became frustrated that his Wife was outside and not coming in and felt he needed to get up and go out there to see the construction going on- they have been banging on the walls for 3 hours now.    Pt appears to present w/ oral phase swallowing issues/dysphagia in setting of declined Cognitive status- ANY Cognitive decline can impact overall awareness/timing of oral phase, swallowing, and safety during po tasks which increases risk for aspiration, choking. Suspect impact from pt's leptomeningeal carcinomatosis, brain mets w/ craniotomy and tumor resection as well as chemoradiation therapy, Confusion and Refusal of oral ,intake, and dependency on feeding.    Pt's risk for aspiration can be reduced when following general aspiration precautions and avoiding any problematic foods requiring increased Cognitive attention/focus- identifying pt's food Preferences would be most beneficial and ensuring his Dentures are placed for effective mastication of solids. Support w/ verbal/visual cues for follow through during po tasks and self-feeding is  key also.        Given [facility] encouragement, pt consumed trials of ice chips, purees, softened solids and thin liquids via Cup w/ No overt, clinical s/s of aspiration noted: no decline in vocal quality, no cough,  and no decline in respiratory status during/post trials. O2 sats 98% when checked. Oral phase was c/b decreased attention to, and awareness of, increased textured boluses including ice chips and solids. W/ verbal cues, pt redirected attention to mastication and/or management of the boluses. Overall adequate bolus management and oral clearing of the boluses was noted given cues when needed. Mastication of softened solids adequate given Time and moistening the foods Cut small. Pt attempted self-feeding by Holding Cup to drink but required min-mod support and guidance d/t the Cognitive decline and overall weakness- this can improve safety of swallowing.  OM Exam was cursory d/t pt's reduced follow through w/ instructions but appeared [facility] w/ No unilateral weakness noted. Inconsistent follow through w/ OM tasks and oral care noted. Hand over hand guidance and visual/verbal cues helpful.         In setting of Cognitive decline, recommend general diet of Regular>Mech Soft foods for Pleasure but CUT small and moistened; thin liquids via CUP. Recommend general aspiration precautions- reduce Distractions during meals and engage pt for self-feeding. Give Encouragement to eat/drink. Sit fully upright for oral intake. Pills Crushed in Puree for safer, efficient swallowing. Support w/ feeding at meals as needed.  MD/NSG updated.  ST services can be available for further education, instructions as needed while admitted per MD order. Recommend follow-up w/ Palliative Care for GOC/POC and education re: impact of Cognitive decline on swallowing as well as Desire for oral intake. Dietician f/u as needed. Precautions posted in room, chart. SLP Visit Diagnosis: Dysphagia, oral phase (R13.11) (impact from leptomeningeal carcinomatosis, brain mets w/ craniotomy and tumor resection as well as chemoradiation therapy; dependency on feeding; Confusion)    Aspiration Risk  Mild aspiration risk;Risk for inadequate nutrition/hydration     Diet Recommendation   Thin;Age appropriate regular (cut/chopped meats; moistened foods) = recommend general diet of Regular>Mech Soft foods for Pleasure but CUT small and moistened; thin liquids via CUP. Recommend general aspiration precautions- reduce Distractions during meals and engage pt for self-feeding. Give Encouragement to eat/drink. Sit fully upright for oral intake. Support w/ feeding at meals as needed.  Medication Administration: Crushed with puree    Other  Recommendations Recommended Consults:  (Palliative Care following; Dietician) Oral Care Recommendations: Oral care BID;Staff/trained caregiver to provide oral care (d/t Confusion)     Assistance Recommended at Discharge  Full support at meals  Functional Status Assessment Patient has had a recent decline in their functional status and/or demonstrates limited ability to make significant improvements in function in a reasonable and predictable amount of time  Frequency and Duration  (n/a)   (n/a)       Prognosis Prognosis for improved oropharyngeal function: Fair Barriers to Reach Goals: Cognitive deficits;Language deficits;Time post onset;Severity of deficits;Behavior Barriers/Prognosis Comment: impact from leptomeningeal carcinomatosis, brain mets w/ craniotomy and tumor resection as well as chemoradiation therapy; Confusion; dependency on being fed      Swallow Study   General Date of Onset: 06/09/24 HPI: Pt is a 68 y.o. 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   Patient's status post craniotomy with tumor resection followed by XRT.   Patient  was hospitalized 04/28/2024 to 05/15/2024 and was discharged to inpatient rehab where he stayed until 06/03/2024.  Patient now readmitted 06/09/2024 with weakness and Fall.  Palliative care consulted to address goals.    Head CT:  No acute intracranial abnormality.  2. Similar appearing bilateral frontal and left cerebelalr vasogenic edema.  No CXR. Type of Study: Bedside Swallow Evaluation Previous Swallow Assessment: during last admit and CIR admit- see notes. Pt was d/c'd on a Regular diet, thins. Diet Prior to this Study: Regular;Thin liquids (Level 0) Temperature Spikes Noted: No (wbc 4.2) Respiratory Status: Room air History of Recent Intubation: No Behavior/Cognition: Alert;Cooperative;Pleasant mood;Confused;Distractible;Requires cueing;Agitated (varying mood/State changes) Oral Cavity Assessment: Within Functional Limits Oral Care Completed by SLP: Recent completion by staff Oral Cavity - Dentition: Dentures, top;Missing dentition Vision:  (appeared) Self-Feeding Abilities: Needs assist;Needs set up;Total assist (could hold Cup to drink w/ min support) Patient Positioning: Upright in bed (total support) Baseline Vocal Quality: Normal Volitional Cough: Cognitively unable to elicit Volitional Swallow: Unable to elicit    Oral/Motor/Sensory Function Overall Oral Motor/Sensory Function: Within functional limits (no unilateral weakness noted)   Ice Chips Ice chips: Impaired Presentation: Spoon (fed; 3 trials) Oral Phase Impairments: Poor awareness of bolus Oral Phase Functional Implications: Prolonged oral transit Pharyngeal Phase Impairments:  (none)   Thin Liquid Thin Liquid: Within functional limits Presentation: Cup;Self Fed (supported; ~3 ozs accepted) Other Comments: water, juice    Nectar Thick Nectar Thick Liquid: Not tested   Honey Thick Honey Thick Liquid: Not tested   Puree Puree: Within functional limits Presentation: Spoon (fed; 3 trials)   Solid     Solid: Impaired (min- lengthy but also did not want the trials) Presentation:  (fed; 3) Oral Phase Impairments: Poor awareness of bolus Oral Phase Functional Implications: Prolonged oral transit Pharyngeal Phase Impairments:  (none)         Comer Portugal, MS, CCC-SLP Speech Language Pathologist Rehab Services; [facility] [place] [phone] (ascom) Wilkes Potvin 06/11/2024,5:04 PM

## 2024-06-11 NOTE — Progress Notes (Signed)
  Radiation Oncology         [phone]) [phone] ________________________________  Name: Roger Black MRN: [id number]  Date: 05/28/2024  DOB: 1956-06-25  End of Treatment Note  Diagnosis:   Recurrent Metastatic Stage IA2, cT1bN0M0, NSCLC, of the LUL with brain and leptomeningeal disease.      Indication for treatment:  Palliative       Radiation treatment dates and sites:     05/15/24-05/28/24 The level of the lumbar spine at the [place] was treated to 30 Gy in 10 fractions with 3D planning technology   05/21/24-05/26/24 The following sites were treated with SRT IMRT technique with ExactTrac technology to 9 Gy to the following targets in 3 fractions [id number] [id number]     Narrative: The patient tolerated radiation treatment well.   There were no signs of acute toxicity after treatment.  Plan:    The patient tolerated radiation. He developed fatigue and had improvement in low back pain.  The patient will receive a call in about one month from the radiation oncology department. He will continue follow up with Dr. Chrystal as well as he requested to re-establish in his clinic for [facility] surveillance.     Donald KYM Husband, PAC

## 2024-06-11 NOTE — Progress Notes (Signed)
 Palliative Medicine [facility] at [facility] [phone] [phone]) [phone]   Name: Roger Black Date: 06/11/2024 MRN: [id number]  DOB: 03-Feb-1956  Patient Care Team: [name], MD as PCP - General (Internal Medicine) Roger Gills, RN as Oncology Nurse Navigator    REASON FOR CONSULTATION: Roger Black is a 68 y.o. 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  Patient's status post craniotomy with tumor resection followed by XRT.  Patient was hospitalized 04/28/2024 to 05/15/2024 and was discharged to inpatient rehab where he stayed until 06/03/2024.  Patient now readmitted 06/09/2024 with weakness.  Palliative care consulted to address goals.    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   right lateral hand, treating with 5FU/calcipotriene   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   vertex scalp, treating with 5FU/calcipotriene    PAST SURGICAL HISTORY:  Past Surgical History:  Procedure Laterality Date   APPLICATION OF CRANIAL NAVIGATION Right 05/08/2024   Procedure: COMPUTER-ASSISTED NAVIGATION, FOR CRANIAL PROCEDURE;  Surgeon: [name] Dino HERO, MD;  Location: [facility] OR;  Service: Neurosurgery;  Laterality: Right;   COLON  SURGERY     COLONOSCOPY WITH PROPOFOL  N/A 10/20/2020   Procedure: COLONOSCOPY WITH PROPOFOL ;  Surgeon: Toledo, Ladell POUR, MD;  Location: [facility] ENDOSCOPY;  Service: Gastroenterology;  Laterality: N/A;   CRANIOTOMY Right 05/08/2024   Procedure: CRANIOTOMY TUMOR EXCISION;  Surgeon: [name] Dino HERO, MD;  Location: [facility] OR;  Service: Neurosurgery;  Laterality: Right;    HEMATOLOGY/ONCOLOGY HISTORY:  Oncology History   No history exists.    ALLERGIES:  has no known allergies.  MEDICATIONS:  Current Facility-Administered Medications  Medication Dose Route Frequency Provider Last Rate Last Admin   acetaminophen (TYLENOL) tablet 650 mg  650 mg Oral Q6H PRN Niu, Xilin, MD       albuterol (PROVENTIL) (2.5 MG/3ML) 0.083% nebulizer solution 2.5 mg  2.5 mg Inhalation Q4H PRN Niu, Xilin, MD       apixaban WINN) tablet 5 mg  5 mg Oral BID Niu, Xilin, MD   5 mg at 06/11/24 0846   atorvastatin (LIPITOR) tablet 80 mg  80 mg Oral Daily Niu, Xilin, MD   80 mg at 06/11/24 9153   Chlorhexidine Gluconate Cloth 2 % PADS 6 each  6 each Topical Daily Amin, Sumayya, MD   6 each at 06/10/24 1700   cholecalciferol (VITAMIN D3) 25 MCG (1000 UNIT) tablet 1,000 Units  1,000 Units Oral Daily Niu, Xilin, MD   1,000 Units at 06/11/24 0846   clonazePAM (KLONOPIN) tablet 0.5 mg  0.5 mg Oral TID PRN Niu, Xilin, MD   0.5 mg at 06/10/24 2049   dexamethasone (DECADRON) tablet 4 mg  4 mg Oral Daily Niu, Xilin, MD  4 mg at 06/11/24 0846   dextromethorphan -guaiFENesin  (MUCINEX  DM) 30-600 MG per 12 hr tablet 1 tablet  1 tablet Oral BID PRN Niu, Xilin, MD       DULoxetine  (CYMBALTA ) DR capsule 60 mg  60 mg Oral Daily Niu, Xilin, MD   60 mg at 06/11/24 9153   fluticasone  furoate-vilanterol (BREO ELLIPTA ) 100-25 MCG/ACT 1 puff  1 puff Inhalation Daily Niu, Xilin, MD   1 puff at 06/11/24 0848   hydrALAZINE  (APRESOLINE ) injection 5 mg  5 mg Intravenous Q2H PRN Niu, Xilin, MD       ipratropium (ATROVENT ) 0.03 % nasal spray 1 spray  1  spray Nasal BID PRN Niu, Xilin, MD       levETIRAcetam  (KEPPRA ) tablet 500 mg  500 mg Oral BID Niu, Xilin, MD   500 mg at 06/11/24 9153   metoprolol  tartrate (LOPRESSOR ) tablet 12.5 mg  12.5 mg Oral BID Amin, Sumayya, MD   12.5 mg at 06/11/24 0847   montelukast  (SINGULAIR ) tablet 10 mg  10 mg Oral Daily Niu, Xilin, MD   10 mg at 06/11/24 9153   ondansetron  (ZOFRAN ) injection 4 mg  4 mg Intravenous Q8H PRN Niu, Xilin, MD       oxyCODONE  (Oxy IR/ROXICODONE ) immediate release tablet 10 mg  10 mg Oral Q4H PRN Niu, Xilin, MD   10 mg at 06/10/24 2048   pantoprazole  (PROTONIX ) EC tablet 40 mg  40 mg Oral Daily Niu, Xilin, MD   40 mg at 06/11/24 0846   polyethylene glycol (MIRALAX  / GLYCOLAX ) packet 17 g  17 g Oral BID Niu, Xilin, MD   17 g at 06/11/24 9155   pregabalin  (LYRICA ) capsule 150 mg  150 mg Oral TID Niu, Xilin, MD   150 mg at 06/11/24 0846   senna-docusate (Senokot-S) tablet 1 tablet  1 tablet Oral QHS Niu, Xilin, MD   1 tablet at 06/10/24 2050   tamsulosin  (FLOMAX ) capsule 0.4 mg  0.4 mg Oral QPC supper Niu, Xilin, MD   0.4 mg at 06/10/24 1700   tiZANidine  (ZANAFLEX ) tablet 2 mg  2 mg Oral TID Niu, Xilin, MD   2 mg at 06/11/24 0851   traZODone  (DESYREL ) tablet 50 mg  50 mg Oral QHS PRN Niu, Xilin, MD   50 mg at 06/10/24 2051    VITAL SIGNS: BP (!) 145/98 (BP Location: Left Arm)   Pulse 84   Temp 98.1 F (36.7 C)   Resp 15   Ht 5' 7 (1.702 m)   Wt 136 lb 7.4 oz (61.9 kg)   SpO2 92%   BMI 21.37 kg/m  Filed Weights   06/09/24 2352 06/10/24 0500 06/11/24 0318  Weight: 189 lb 9.5 oz (86 kg) 196 lb 6.9 oz (89.1 kg) 136 lb 7.4 oz (61.9 kg)    Estimated body mass index is 21.37 kg/m as calculated from the following:   Height as of this encounter: 5' 7 (1.702 m).   Weight as of this encounter: 136 lb 7.4 oz (61.9 kg).  LABS: CBC:    Component Value Date/Time   WBC 4.2 06/10/2024 0446   HGB 12.2 (L) 06/10/2024 0446   HCT 36.6 (L) 06/10/2024 0446   PLT 57 (L) 06/10/2024 0446    MCV 96.3 06/10/2024 0446   NEUTROABS 3.2 06/09/2024 1812   LYMPHSABS 0.2 (L) 06/09/2024 1812   MONOABS 0.2 06/09/2024 1812   EOSABS 0.0 06/09/2024 1812   BASOSABS 0.0 06/09/2024 1812   Comprehensive Metabolic Panel:  Component Value Date/Time   NA 137 06/10/2024 0446   K 3.5 06/10/2024 0446   CL 100 06/10/2024 0446   CO2 25 06/10/2024 0446   BUN 13 06/10/2024 0446   CREATININE 0.68 06/10/2024 0446   GLUCOSE 90 06/10/2024 0446   CALCIUM 8.9 06/10/2024 0446   AST 31 06/09/2024 1812   ALT 35 06/09/2024 1812   ALKPHOS 60 06/09/2024 1812   BILITOT 0.5 06/09/2024 1812   PROT 5.5 (L) 06/09/2024 1812   ALBUMIN 3.3 (L) 06/09/2024 1812    RADIOGRAPHIC STUDIES: CT HEAD WO CONTRAST ( ) Result Date: 06/09/2024 EXAM: CT HEAD WITHOUT CONTRAST 06/09/2024 11:23:59 PM TECHNIQUE: CT of the head was performed without the administration of intravenous contrast. Automated exposure control, iterative reconstruction, and/or weight based adjustment of the mA/kV was utilized to reduce the radiation dose to as low as reasonably achievable. COMPARISON: CT head 05/09/2024, mri head 05/18/24 CLINICAL HISTORY: Head trauma, minor (Age >= 65y) FINDINGS: BRAIN AND VENTRICLES: No acute hemorrhage. No evidence of acute infarct. No hydrocephalus. No extra-axial collection. No mass effect or midline shift. Patchy and confluent areas of decreased attenuation are noted throughout the deep and periventricular white matter of the cerebral hemispheres bilaterally suggestive of chronic microvascular ischemic changes. Similar appearing bilateral frontal and left cerebelalr vasogenic edema. Prior right craniotomy. ORBITS: No acute abnormality. SINUSES: No acute abnormality. SOFT TISSUES AND SKULL: No acute soft tissue abnormality. No skull fracture. IMPRESSION: 1. No acute intracranial abnormality. 2. Similar appearing bilateral frontal and left cerebelalr vasogenic edema. Electronically signed by: Morgane Naveau MD 06/09/2024  11:35 PM EST RP Workstation: [id number]   DG Hip Unilat W or Wo Pelvis 2-3 Views Right Result Date: 06/09/2024 CLINICAL DATA:  Fall with hip pain. EXAM: DG HIP (WITH OR WITHOUT PELVIS) 2-3V RIGHT COMPARISON:  None. FINDINGS: There is no evidence of hip fracture or dislocation. There is no evidence of arthropathy or other focal bone abnormality. IMPRESSION: Negative. Electronically Signed   By: Greig Pique M.D.   On: 06/09/2024 19:43   VAS US  LOWER EXTREMITY VENOUS (DVT) Result Date: 06/01/2024  Lower Venous DVT Study Patient Name:  BLAZE NYLUND [facility]  Date of Exam:   05/31/2024 Medical Rec #: [id number]             Accession #:    [id number] Date of Birth: 08-21-1955             Patient Gender: M Patient Age:   64 years Exam Location:  [facility] Procedure:      VAS US  LOWER EXTREMITY VENOUS (DVT) Referring Phys: SVEN ELKS --------------------------------------------------------------------------------  Indications: Swelling.  Comparison Study: Prior negative bilateral LEV done 02/13/24 Performing Technologist: Alberta Lis RVS  Examination Guidelines: A complete evaluation includes B-mode imaging, spectral Doppler, color Doppler, and power Doppler as needed of all accessible portions of each vessel. Bilateral testing is considered an integral part of a complete examination. Limited examinations for reoccurring indications may be performed as noted. The reflux portion of the exam is performed with the patient in reverse Trendelenburg.  +-----+---------------+---------+-----------+----------+--------------+ RIGHTCompressibilityPhasicitySpontaneityPropertiesThrombus Aging +-----+---------------+---------+-----------+----------+--------------+ CFV  Full           Yes      Yes                                 +-----+---------------+---------+-----------+----------+--------------+ SFJ  Full                                                         +-----+---------------+---------+-----------+----------+--------------+   +---------+---------------+---------+-----------+----------+-----------------+  LEFT     CompressibilityPhasicitySpontaneityPropertiesThrombus Aging    +---------+---------------+---------+-----------+----------+-----------------+ CFV      Full           Yes      Yes                                    +---------+---------------+---------+-----------+----------+-----------------+ SFJ      Full                                                           +---------+---------------+---------+-----------+----------+-----------------+ FV Prox  Full           Yes      No                                     +---------+---------------+---------+-----------+----------+-----------------+ FV Mid   Partial        Yes      No                   Age Indeterminate +---------+---------------+---------+-----------+----------+-----------------+ FV DistalPartial                                      Age Indeterminate +---------+---------------+---------+-----------+----------+-----------------+ PFV      Full           Yes      No                                     +---------+---------------+---------+-----------+----------+-----------------+ POP      Full           Yes      Yes                                    +---------+---------------+---------+-----------+----------+-----------------+ PTV      Partial                                      Age Indeterminate +---------+---------------+---------+-----------+----------+-----------------+ PERO     Partial                                      Age Indeterminate +---------+---------------+---------+-----------+----------+-----------------+ Gastroc  Full                                                           +---------+---------------+---------+-----------+----------+-----------------+     Summary: RIGHT: - No evidence of common femoral vein  obstruction.   LEFT: - Findings consistent with age indeterminate deep vein thrombosis involving the left femoral vein, left posterior tibial veins, and left peroneal veins.  - No cystic structure found in  the popliteal fossa.  *See table(s) above for measurements and observations. Electronically signed by Debby Robertson on 06/01/2024 at 2:12:59 PM.    Final    DG CHEST PORT 1 VIEW Result Date: 05/23/2024 CLINICAL DATA:  Cough EXAM: PORTABLE CHEST 1 VIEW COMPARISON:  CT chest dated 05/06/2024 FINDINGS: Normal lung volumes. Bibasilar hazy and patchy opacities, right-greater-than-left. Bilateral upper lung lucency. No pleural effusion or pneumothorax. The heart size and mediastinal contours are within normal limits. No acute osseous abnormality. IMPRESSION: 1. Bibasilar hazy and patchy opacities, right-greater-than-left, suspicious for aspiration or pneumonia. 2. Bilateral upper lung lucency, likely related to emphysema. Electronically Signed   By: Limin  Xu M.D.   On: 05/23/2024 17:38   US  RENAL Result Date: 05/21/2024 CLINICAL DATA:  Urinary retention EXAM: RENAL / URINARY TRACT ULTRASOUND COMPLETE COMPARISON:  CT 05/06/2024 FINDINGS: Right Kidney: Renal measurements: 10.3 x 4.3 x 4 cm = volume: 94 mL. Echogenicity within normal limits. No mass or hydronephrosis visualized. Left Kidney: Renal measurements: 10 x 4.5 x 3.6 cm = volume: 83 mL. Echogenicity within normal limits. No mass or hydronephrosis visualized. Bladder: Distended urinary bladder. Other: None. IMPRESSION: Negative renal ultrasound.  Distended urinary bladder. Electronically Signed   By: Luke Bun M.D.   On: 05/21/2024 21:22   MR BRAIN W WO CONTRAST Result Date: 05/18/2024 EXAM: MRI BRAIN WITH AND WITHOUT CONTRAST 05/18/2024 04:45:00 PM TECHNIQUE: Multiplanar multisequence MRI of the head/brain was performed with and without the administration of intravenous contrast. COMPARISON: MRI May 05, 2024 CLINICAL HISTORY: Metastatic disease  evaluation; 3T SRS Protocol for radiation treatment planning. FINDINGS: BRAIN AND VENTRICLES: No acute infarct. No acute intracranial hemorrhage. No mass effect or midline shift. No hydrocephalus. Similar left frontal lobe T2 hyperintensity, likely gliosis. Normal flow voids. No substantial change in approximately 15 x 12 mm enhancing lesion in the right frontal lobe and 17 mm lesion in the left cerebellum. No new enhancing lesions identified. Axial postcontrast imaging is limited by motion. The surrounding T2 FLAIR hyperintense edema is improved. ORBITS: No acute abnormality. SINUSES: No acute abnormality. BONES AND SOFT TISSUES: Normal bone marrow signal and enhancement. No acute soft tissue abnormality. IMPRESSION: 1. 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 Improved surrounding edema. 2. No new enhancing lesions identified. Electronically signed by: Gilmore Molt MD 05/18/2024 11:27 PM EDT RP Workstation: [id number]    PERFORMANCE STATUS (ECOG) : 4 - Bedbound  Review of Systems Unless otherwise noted, a complete review of systems is negative.  Physical Exam General: NAD Pulmonary: Unlabored Extremities: no edema, no joint deformities Skin: no rashes Neurological: Weakness, situationally confused  IMPRESSION: Follow-up visit.  Patient remains situationally confused and unable to engage meaningfully in conversation regarding goals.  No family present.  I again attempted to call patient's wife and left another voicemail.  I have asked the RN to notify me as patient's wife comes to the hospital.  Unfortunately, patient has not felt to have meaningful treatment options available for his cancer.  Hospice would be recommended  PLAN: - Continue current scope of treatment - Will try to speak with wife to address goals  Case and plan discussed with Dr. Melanee  Time Total: 15 minutes  Visit consisted of counseling and education dealing with the  complex and emotionally intense issues of symptom management and palliative care in the setting of serious and potentially life-threatening illness.Greater than 50%  of this time was spent counseling and coordinating care related to the above assessment  and plan.  Signed by: Fonda Mower, PhD, NP-C

## 2024-06-11 NOTE — Progress Notes (Signed)
 Mobility Specialist - Progress Note   06/11/24 1500  Mobility  Activity Stood at bedside;Respositioned in chair;Dangled on edge of bed;Pivoted/transferred from bed to chair  Level of Assistance +2 (takes two people)  Assistive Device [employer] of Motion/Exercises Active  Activity Response Tolerated well  Mobility visit 1 Mobility  Mobility Specialist Start Time (ACUTE ONLY) 1457  Mobility Specialist Stop Time (ACUTE ONLY) 1515  Mobility Specialist Time Calculation (min) (ACUTE ONLY) 18 min   Pt was supine in bed with HOB elevated on RA. Pt agreed to mobility. Pt is able with minA CGA to get to the EOB today. Pt was able today to STS with STS (+2) GCA. Pt was repositioned in the recliner with needs in reach.  Clem Rodes Mobility Specialist 06/11/24, 3:38 PM

## 2024-06-11 NOTE — Progress Notes (Signed)
 Mobility Specialist - Progress Note    06/11/24 1723  Therapy Vitals  Temp 97.6 F (36.4 C)  Pulse Rate 89  Resp 16  BP (!) 133/92  Patient Position (if appropriate) Lying  Oxygen  Therapy  SpO2 94 %  O2 Device Room Air  Mobility  Activity Stood at bedside;Ambulated with assistance;Pivoted/transferred from chair to bed  Level of Assistance +2 (takes two people)  Assistive Device [employer] Ambulated (ft) 3 ft  Range of Motion/Exercises Active  Activity Response Tolerated fair  Mobility visit 1 Mobility  Mobility Specialist Start Time (ACUTE ONLY) 1600  Mobility Specialist Stop Time (ACUTE ONLY) 1620  Mobility Specialist Time Calculation (min) (ACUTE ONLY) 20 min   Pt was in recliner on RA with guest in the room and nurse. Pt agreed to be pivoted into bed. Pt was repositioned into bed with use of stedy. Pt is in bed with needs in reach and nurse in room upon exit.  Clem Rodes Mobility Specialist 06/11/24, 5:29 PM

## 2024-06-11 NOTE — Plan of Care (Signed)

## 2024-06-11 NOTE — Progress Notes (Signed)
 PROGRESS NOTE    Roger Black  FMW:[id number] DOB: 02-Jan-1956 DOA: 06/09/2024 PCP: Roger [name], MD  [id number]-AA  LOS: 1 day   Brief hospital course:   Assessment & Plan: Roger Black is a 68 y.o. 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   Patient recently had a long and complicated admission from 10/6 - 11/11 due to leptomeningeal carcinomatosis, bilateral lower extremity pain, NSCLC metastasized to brain.  Patient is s/p of craniotomy with brain tumor resection.  Patient is s/p of palliative radiation therapy, currently not on chemotherapy.  Patient was discharged to CIR on 10/23 and then home on 11/11.   Since home patient has progressive weakness and now unable to walk.  Had a fall on 11/12.   He has difficulty urinating, found to have acute urinary retention with 478 mL in the ED. Foley catheter was placed.  * Generalized weakness Falls. Patient with worsening generalized weakness and unable to walk or do ADLs now.  Recently discharged from CIR when he was able to walk some with the help of walker before discharge. --History of stage IV lung cancer with progression, recent craniotomy and palliative radiation to brain and cervical spine.  Also has leptomeningeal spread. --Palliative care was consulted. 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   Not on any chemotherapy at this time. -Continuing home Decadron and Keppra --onc palliative care consulted, will pursue hospice   Acute urinary retention Patient with history of prior urinary retention which was initially thought to be due to cauda equina, later he was  able to void and discharged home without Foley. --Now back with urinary retention requiring Foley catheter which was placed in ED. Received 1 dose of ceftriaxone which was not continued, recently treated for pansensitive E. coli UTI. -Repeat blood cultures obtained and pending -Continue with Flomax -Patient need to follow-up with outpatient urology for voiding trial   Benign hypertension Blood pressure within goal. --cont lopressor     Chronic systolic CHF (congestive heart failure) (HCC)  2D echo on 02/23/2024 showed EF of 45%.  His on torsemide and spironolactone were on hold from recent admission.  Patient has mild leg edema, but BNP is only 502.  No SOB.  --cont lopressor   COPD (chronic obstructive pulmonary disease) (HCC) No current concern of exacerbation, no wheezing. --cont bronchodilators   HLD (hyperlipidemia) - Continue Lipitor   Hypokalemia --monitor and supplement PRN   CKD stage 3a, ruled out CKD2 GFR above 60.     Thrombocytopenia Seems chronic, platelets of 57 today. Likely related to his advanced malignancy -Continue to monitor   History of DVT (deep vein thrombosis) - Continue Eliquis - Needed close monitoring due to thrombocytopenia.    DVT prophylaxis: Nw:Zopvlpd Code Status: DNR  Family Communication: wife updated at bedside today Level of care: Telemetry Dispo:   The patient is from: home Anticipated d/c is to: to be determined Anticipated d/c date is: when disposition found   Subjective and Interval History:  Palliative care provider discussed with wife and family, who decided to pursue hospice.   Objective: Vitals:   06/11/24 0318 06/11/24 0817 06/11/24 1723 06/11/24 1928  BP:  (!) 145/98 (!) 133/92 (!) 154/111  Pulse:  84 89 95  Resp:  15 16   Temp:  98.1 F (36.7 C) 97.6 F (36.4 C) 97.7 F (36.5 C)  TempSrc:    Oral  SpO2:  92% 94% 94%  Weight: 61.9 kg     Height:        Intake/Output Summary (Last 24 hours) at 06/11/2024  2116 Last data filed at 06/11/2024 0516 Gross per 24 hour  Intake --  Output 700 ml  Net -700 ml   Filed Weights   06/09/24 2352 06/10/24 0500 06/11/24 0318  Weight: 86 kg 89.1 kg 61.9 kg    Examination:   Constitutional: NAD, alert, unclear orientation HEENT: conjunctivae and lids normal, EOMI CV: No cyanosis.   RESP: normal respiratory effort, on RA   Data Reviewed: I have personally reviewed labs and imaging studies  Time spent: 50 minutes  Ellouise Haber, MD [facility] If 7PM-7AM, please contact night-coverage 06/11/2024, 9:16 PM

## 2024-06-11 NOTE — Evaluation (Signed)
Occupational Therapy Evaluation Patient Details Name: Roger Black MRN: [id number] DOB: March 30, 1956 Today's Date: 06/11/2024   History of Present Illness   Roger Black is a 68 y.o. 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 Pt presents with weakness and fall    Clinical Impressions Roger Black was seen for OT evaluation this date. Prior to hospital admission, pt was recently d/c from CIR on 06/02/24 at [facility] wheelchair level for ADLs, CGA + RW for t/fs. On arrival pt oriented to self only, fluctuating alertness in session. Pt currently requires MIN A self-drinking at bed in chair position. Pt initiates rolling with UE but ultimately requires MAX A rolling for toileting. Pt would benefit from skilled OT to address noted impairments and functional limitations (see below for any additional details). Upon hospital discharge, recommend +2 for all mobility, may benefit from follow up therapy.   If plan is discharge home, recommend the following:   Two people to help with walking and/or transfers;Two people to help with bathing/dressing/bathroom     Functional Status Assessment   Patient has had a recent decline in their functional status and demonstrates the ability to make significant improvements in function in a reasonable and predictable amount of time.     Equipment Recommendations   Hospital bed      Precautions/Restrictions   Precautions Precautions: Fall Recall of Precautions/Restrictions: Impaired Restrictions Weight Bearing Restrictions Per Provider Order: No     Mobility Bed Mobility Overal bed mobility: Needs Assistance Bed Mobility: Rolling Rolling: Max assist              Transfers                   General  transfer comment: unable at this time          ADL either performed or assessed with clinical judgement   ADL Overall ADL's : Needs assistance/impaired                                       General ADL Comments: MIN A self-drinking at bed in chair position. Pt initiates rolling with UE but ultimately requires MAX A rolling for toileting      Vision Patient Visual Report: Diplopia              Pertinent Vitals/Pain Pain Assessment Pain Assessment: PAINAD Breathing: normal Negative Vocalization: none Facial Expression: smiling or inexpressive Body Language: tense, distressed pacing, fidgeting Consolability: distracted or reassured by voice/touch PAINAD Score: 2     Extremity/Trunk Assessment Upper Extremity Assessment Upper Extremity Assessment: Generalized weakness   Lower Extremity Assessment Lower Extremity Assessment: Generalized weakness       Communication Communication Communication: Impaired   Cognition Arousal: Lethargic Behavior During Therapy: Flat affect Cognition: No family/caregiver present to determine baseline             OT - Cognition Comments: oriented to self only despite reorienting to location t/o session. Pt asking to dictate letter and accurately states his home address.                 Following commands: Impaired Following commands impaired: Follows one step commands inconsistently  Home Living Family/patient expects to be discharged to:: Private residence Living Arrangements: Spouse/significant other Available Help at Discharge: Family;Available 24 hours/day Type of Home: House Home Access: Level entry     Home Layout: One level     Bathroom Shower/Tub: Tub/shower unit         Home Equipment: [place] - single point   Additional Comments: home setup per chart from 05/16/24 admission      Prior Functioning/Environment Prior Level of Function : Working/employed;History of  Falls (last six months);Needs assist               ADLs Comments: pt was ind and working at [employer] until recent craniotomy with tumor resection on 05/08/24 following which pt admitted to CIR and was d/c at [facility] w/c level for ADLs, CGA + RW for t/fs.    OT Problem List: Decreased activity tolerance;Impaired balance (sitting and/or standing);Decreased strength;Decreased range of motion   OT Treatment/Interventions: Self-care/ADL training;Therapeutic exercise;Energy conservation;DME and/or AE instruction;Therapeutic activities;Patient/family education;Balance training      OT Goals(Current goals can be found in the care plan section)   Acute Rehab OT Goals Patient Stated Goal: to go home OT Goal Formulation: Patient unable to participate in goal setting Time For Goal Achievement: 07/11/2024 Potential to Achieve Goals: Fair ADL Goals Pt Will Perform Eating: with supervision;bed level Pt Will Perform Grooming: with mod assist;sitting Pt Will Transfer to Toilet: with min assist (bedlevl)   OT Frequency:  Min 2X/week    Co-evaluation              AM-PAC OT 6 Clicks Daily Activity     Outcome Measure Help from another person eating meals?: A Little Help from another person taking care of personal grooming?: A Lot Help from another person toileting, which includes using toliet, bedpan, or urinal?: A Lot Help from another person bathing (including washing, rinsing, drying)?: A Lot Help from another person to put on and taking off regular upper body clothing?: A Lot Help from another person to put on and taking off regular lower body clothing?: A Lot 6 Click Score: 13   End of Session Nurse Communication: Mobility status  Activity Tolerance: Patient limited by lethargy Patient left: in bed;with call bell/phone within reach;with bed alarm set  OT Visit Diagnosis: Other abnormalities of gait and mobility (R26.89);Muscle weakness (generalized) (M62.81)                 Time: 9066-9050 OT Time Calculation (min): 16 min Charges:  OT General Charges $OT Visit: 1 Visit OT Evaluation $OT Eval Low Complexity: 1 Low  Roger Black, M.S. OTR/L  06/11/24, 10:46 AM  ascom [phone]

## 2024-06-11 NOTE — Consult Note (Signed)
 PULMONOLOGY         Date: 06/11/2024,   MRN# [id number] Roger Black 11-01-1955     AdmissionWeight: 91 kg                 CurrentWeight: 61.9 kg  Referring provider: Dr Caleen   CHIEF COMPLAINT:   Advanced COPD with lung cancer and encephalopathy   HISTORY OF PRESENT ILLNESS   This is a 68 y.o. male with PMH of Asthma COPD overlap with OSA and NSCLC with distant brain mets, cardiorenal syndrome, neuropathy. He had oncology evaluation with palliative goal due to leptomeningeal carcinomatosis and poor prognostication. He has DVT and is on eliquis . HE had craniotomy with tumor resection as well as chemoradiation therapy.  He is deconditioned.  He came in with confusion and weakness complaining of dysuria.  He had UA with signs of possible UTI.  He also had elevated cardiac biomarkers suggestive of cardiac dysfunction.  Due to complex pulmonary history and confusion.   He had CT chest last month with no new findings and smaller size of left apical nodule. There is also atelectasis. PCCM consultation for further evaluation and management.     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   right lateral hand, treating with 5FU/calcipotriene   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   vertex scalp, treating with 5FU/calcipotriene     SURGICAL HISTORY   Past Surgical History:  Procedure Laterality Date   APPLICATION OF CRANIAL  NAVIGATION Right 05/08/2024   Procedure: COMPUTER-ASSISTED NAVIGATION, FOR CRANIAL PROCEDURE;  Surgeon: [name] Dino HERO, MD;  Location: [facility] OR;  Service: Neurosurgery;  Laterality: Right;   COLON SURGERY     COLONOSCOPY WITH PROPOFOL  N/A 10/20/2020   Procedure: COLONOSCOPY WITH PROPOFOL ;  Surgeon: Toledo, Ladell POUR, MD;  Location: [facility] ENDOSCOPY;  Service: Gastroenterology;  Laterality: N/A;   CRANIOTOMY Right 05/08/2024   Procedure: CRANIOTOMY TUMOR EXCISION;  Surgeon: [name] Dino HERO, MD;  Location: [facility] OR;  Service: Neurosurgery;  Laterality: Right;     FAMILY HISTORY   Family History  Problem Relation Age of Onset   Hypertension Mother    Hypertension Father      SOCIAL HISTORY   Social History   Tobacco Use   Smoking status: Former    Current packs/day: 1.00    Average packs/day: 1 pack/day for 47.0 years (47.0 ttl pk-yrs)    Types: Cigarettes   Smokeless tobacco: Never  Vaping Use   Vaping status: Never Used  Substance Use Topics   Alcohol  use: Never   Drug use: Never     MEDICATIONS    Home Medication:    Current Medication:  Current Facility-Administered Medications:    acetaminophen  (TYLENOL ) tablet 650 mg, 650 mg, Oral, Q6H PRN, Niu, Xilin, MD   albuterol  (PROVENTIL ) (2.5 MG/3ML) 0.083% nebulizer solution 2.5 mg, 2.5 mg, Inhalation, Q4H PRN,  Niu, Xilin, MD   apixaban WINN) tablet 5 mg, 5 mg, Oral, BID, Niu, Xilin, MD, 5 mg at 06/10/24 2052   atorvastatin (LIPITOR) tablet 80 mg, 80 mg, Oral, Daily, Niu, Xilin, MD, 80 mg at 06/10/24 9157   Chlorhexidine Gluconate Cloth 2 % PADS 6 each, 6 each, Topical, Daily, Amin, Sumayya, MD, 6 each at 06/10/24 1700   cholecalciferol (VITAMIN D3) 25 MCG (1000 UNIT) tablet 1,000 Units, 1,000 Units, Oral, Daily, Niu, Xilin, MD, 1,000 Units at 06/10/24 9157   clonazePAM (KLONOPIN) tablet 0.5 mg, 0.5 mg, Oral, TID PRN, Niu, Xilin, MD, 0.5 mg at 06/10/24 2049   dexamethasone (DECADRON) tablet 4 mg, 4 mg, Oral, Daily, Niu,  Xilin, MD, 4 mg at 06/10/24 9157   dextromethorphan-guaiFENesin (MUCINEX DM) 30-600 MG per 12 hr tablet 1 tablet, 1 tablet, Oral, BID PRN, Niu, Xilin, MD   DULoxetine (CYMBALTA) DR capsule 60 mg, 60 mg, Oral, Daily, Niu, Xilin, MD, 60 mg at 06/10/24 0841   fluticasone furoate-vilanterol (BREO ELLIPTA) 100-25 MCG/ACT 1 puff, 1 puff, Inhalation, Daily, Niu, Xilin, MD, 1 puff at 06/10/24 0848   hydrALAZINE (APRESOLINE) injection 5 mg, 5 mg, Intravenous, Q2H PRN, Niu, Xilin, MD   ipratropium (ATROVENT) 0.03 % nasal spray 1 spray, 1 spray, Nasal, BID PRN, Niu, Xilin, MD   levETIRAcetam (KEPPRA) tablet 500 mg, 500 mg, Oral, BID, Niu, Xilin, MD, 500 mg at 06/10/24 2050   metoprolol tartrate (LOPRESSOR) tablet 12.5 mg, 12.5 mg, Oral, BID, Amin, Sumayya, MD, 12.5 mg at 06/10/24 2049   montelukast (SINGULAIR) tablet 10 mg, 10 mg, Oral, Daily, Niu, Xilin, MD, 10 mg at 06/10/24 0842   ondansetron (ZOFRAN) injection 4 mg, 4 mg, Intravenous, Q8H PRN, Niu, Xilin, MD   oxyCODONE (Oxy IR/ROXICODONE) immediate release tablet 10 mg, 10 mg, Oral, Q4H PRN, Niu, Xilin, MD, 10 mg at 06/10/24 2048   pantoprazole (PROTONIX) EC tablet 40 mg, 40 mg, Oral, Daily, Niu, Xilin, MD, 40 mg at 06/10/24 0842   polyethylene glycol (MIRALAX / GLYCOLAX) packet 17 g, 17 g, Oral, BID, Niu, Xilin, MD, 17 g at 06/10/24 2052   pregabalin (LYRICA) capsule 150 mg, 150 mg, Oral, TID, Niu, Xilin, MD, 150 mg at 06/10/24 2051   senna-docusate (Senokot-S) tablet 1 tablet, 1 tablet, Oral, QHS, Niu, Xilin, MD, 1 tablet at 06/10/24 2050   tamsulosin (FLOMAX) capsule 0.4 mg, 0.4 mg, Oral, QPC supper, Niu, Xilin, MD, 0.4 mg at 06/10/24 1700   tiZANidine (ZANAFLEX) tablet 2 mg, 2 mg, Oral, TID, Niu, Xilin, MD, 2 mg at 06/10/24 2052   traZODone (DESYREL) tablet 50 mg, 50 mg, Oral, QHS PRN, Niu, Xilin, MD, 50 mg at 06/10/24 2051    ALLERGIES   Patient has no known allergies.     REVIEW OF SYSTEMS    Review of Systems:  Gen:  Denies  fever,  sweats, chills weigh loss  HEENT: Denies blurred vision, double vision, ear pain, eye pain, hearing loss, nose bleeds, sore throat Cardiac:  No dizziness, chest pain or heaviness, chest tightness,edema Resp:   reports dyspnea chronically  Gi: Denies swallowing difficulty, stomach pain, nausea or vomiting, diarrhea, constipation, bowel incontinence Gu:  Denies bladder incontinence, burning urine Ext:   Denies Joint pain, stiffness or swelling Skin: Denies  skin rash, easy bruising or bleeding or hives Endoc:  Denies polyuria, polydipsia , polyphagia or weight change Psych:   Denies depression, insomnia or hallucinations   Other:  All other systems negative   VS: BP (!) 142/95  Pulse 84   Temp 98 F (36.7 C)   Resp 18   Ht 5' 7 (1.702 m)   Wt 61.9 kg   SpO2 95%   BMI 21.37 kg/m      PHYSICAL EXAM    GENERAL:NAD, no fevers, chills, no weakness no fatigue HEAD: Normocephalic, atraumatic.  EYES: Pupils equal, round, reactive to light. Extraocular muscles intact. No scleral icterus.  MOUTH: Moist mucosal membrane. Dentition intact. No abscess noted.  EAR, NOSE, THROAT: Clear without exudates. No external lesions.  NECK: Supple. No thyromegaly. No nodules. No JVD.  PULMONARY: decreased breath sounds with mild rhonchi worse at bases bilaterally.  CARDIOVASCULAR: S1 and S2. Regular rate and rhythm. No murmurs, rubs, or gallops. No edema. Pedal pulses 2+ bilaterally.  GASTROINTESTINAL: Soft, nontender, nondistended. No masses. Positive bowel sounds. No hepatosplenomegaly.  MUSCULOSKELETAL: No swelling, clubbing, or edema. Range of motion full in all extremities.  NEUROLOGIC: Cranial nerves II through XII are intact. No gross focal neurological deficits. Sensation intact. Reflexes intact.  SKIN: No ulceration, lesions, rashes, or cyanosis. Skin warm and dry. Turgor intact.  PSYCHIATRIC: Mood, affect within normal limits. The patient is awake, alert and oriented x 3. Insight, judgment  intact.       IMAGING   Study Result  Narrative & Impression  CLINICAL DATA:  Cough   EXAM: PORTABLE CHEST 1 VIEW   COMPARISON:  CT chest dated 05/06/2024   FINDINGS: Normal lung volumes. Bibasilar hazy and patchy opacities, right-greater-than-left. Bilateral upper lung lucency. No pleural effusion or pneumothorax. The heart size and mediastinal contours are within normal limits. No acute osseous abnormality.   IMPRESSION: 1. Bibasilar hazy and patchy opacities, right-greater-than-left, suspicious for aspiration or pneumonia. 2. Bilateral upper lung lucency, likely related to emphysema.     Electronically Signed   By: Limin  Xu M.D.   On: 05/23/2024 17:38      ASSESSMENT/PLAN   NSCLC with distant metastasis  -s/p oncology and palliative evaluation   - no additional therapy is offered with palliative goal -consider hospice evaluation  Asthma COPD overlap with OSA   Patient may use inhouse CPAP overnight   Bibasilar atelectasis  - consider Metaneb recuritment if patient able to participate  - Incentive spirometry - OT/PT    Acute on chronic systolic CHF       -diuresis as able to tolerate       Thank you for allowing me to participate in the care of this patient.   Patient/Family are satisfied with care plan and all questions have been answered.    Provider disclosure: Patient with at least one acute or chronic illness or injury that poses a threat to life or bodily function and is being managed actively during this encounter.  All of the below services have been performed independently by signing provider:  review of prior documentation from internal and or external health records.  Review of previous and current lab results.  Interview and comprehensive assessment during patient visit today. Review of current and previous chest radiographs/CT scans. Discussion of management and test interpretation with health care team and patient/family.   This  document was prepared using Dragon voice recognition software and may include unintentional dictation errors.     Laini Urick, M.D.  Division of Pulmonary & Critical Care Medicine

## 2024-06-12 DIAGNOSIS — R531 Weakness: Secondary | ICD-10-CM | POA: Diagnosis not present

## 2024-06-12 NOTE — Progress Notes (Signed)
 PROGRESS NOTE    Huie Ghuman  FMW:[id number] DOB: [birth year] DOA: 06/09/2024 PCP: [name], MD  [id number]-AA  LOS: 3 days   Brief hospital course:   Assessment & Plan: Roger Black is a 68 y.o. 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   Patient recently had a long and complicated admission from 10/6 - 11/11 due to leptomeningeal carcinomatosis, bilateral lower extremity pain, NSCLC metastasized to brain.  Patient is s/p of craniotomy with brain tumor resection.  Patient is s/p of palliative radiation therapy, currently not on chemotherapy.  Patient was discharged to CIR on 10/23 and then home on 11/11.   Since home patient has progressive weakness and now unable to walk.  Had a fall on 11/12.   He has difficulty urinating, found to have acute urinary retention with 478 mL in the ED. Foley catheter was placed.  * Generalized weakness Falls. Patient with worsening generalized weakness and unable to walk or do ADLs now.  Recently discharged from CIR when he was able to walk some with the help of walker before discharge. --History of stage IV lung cancer with progression, recent craniotomy and palliative radiation to brain and cervical spine.  Also has leptomeningeal spread. --Palliative care was consulted. 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   Not on any chemotherapy at this time. -Continuing home Decadron  and Keppra  --onc palliative care consulted, rec hospice   Acute urinary retention Patient with history of prior urinary retention which was initially thought to be due to cauda  equina, later he was able to void and discharged home without Foley. --Now back with urinary retention requiring Foley catheter which was placed in ED. Received 1 dose of ceftriaxone  which was not continued, recently treated for pansensitive E. coli UTI. -Repeat blood cultures obtained and pending -Continue with Flomax  -Patient need to follow-up with outpatient urology for voiding trial   Benign hypertension Blood pressure within goal. --cont lopressor      Chronic systolic CHF (congestive heart failure) (HCC)  2D echo on 02/23/2024 showed EF of 45%.  His on torsemide and spironolactone  were on hold from recent admission.  Patient has mild leg edema, but BNP is only 502.  No SOB.  --cont lopressor    COPD (chronic obstructive pulmonary disease) (HCC) No current concern of exacerbation, no wheezing. --cont bronchodilators   HLD (hyperlipidemia) - Continue Lipitor    Hypokalemia --monitor and supplement PRN   CKD stage 3a, ruled out CKD2 GFR above 60.     Thrombocytopenia Seems chronic, platelets of 57 today. Likely related to his advanced malignancy -Continue to monitor   History of DVT (deep vein thrombosis) - Needed close monitoring due to thrombocytopenia. --cont Eliquis     DVT prophylaxis: On:Eliquis  Code Status: DNR  Family Communication: wife updated at bedside today Level of care: Telemetry Dispo:   The patient is from: home Anticipated d/c is to: to be determined Anticipated d/c date is: when disposition found   Subjective and Interval History:  Speech more clear today, and pt more alert, however, wife said pt was unusually irritable today.   Objective: Vitals:   06/12/24 2033 06/13/24 0449 06/13/24 0500 06/13/24 0838  BP:  (!) 158/101  ROLLEN)  135/100  Pulse:  89  94  Resp:    17  Temp:  97.7 F (36.5 C)    TempSrc:  Oral    SpO2: 92% 93%  93%  Weight:   79.7 kg   Height:        Intake/Output Summary (Last 24 hours) at 06/13/2024 0938 Last data  filed at 06/13/2024 0500 Gross per 24 hour  Intake 240 ml  Output 1100 ml  Net -860 ml   Filed Weights   06/11/24 0318 06/12/24 0450 06/13/24 0500  Weight: 61.9 kg 81.8 kg 79.7 kg    Examination:   Constitutional: NAD, alert, not oriented HEENT: conjunctivae and lids normal, EOMI CV: No cyanosis.   RESP: normal respiratory effort   Data Reviewed: I have personally reviewed labs and imaging studies  Time spent: 35 minutes  Ellouise Haber, MD [facility] If 7PM-7AM, please contact night-coverage 06/13/2024, 9:38 AM

## 2024-06-12 NOTE — Plan of Care (Signed)
  Problem: Nutrition: Goal: Adequate nutrition will be maintained Outcome: Progressing   Problem: Safety: Goal: Ability to remain free from injury will improve Outcome: Progressing   

## 2024-06-12 NOTE — Progress Notes (Signed)
 PULMONOLOGY         Date: 06/12/2024,   MRN# [id number] Roger Black 1956/03/15     AdmissionWeight: 91 kg                 CurrentWeight: 81.8 kg  Referring provider: Dr Caleen   CHIEF COMPLAINT:   Advanced COPD with lung cancer and encephalopathy   HISTORY OF PRESENT ILLNESS   This is a 68 y.o. male with PMH of Asthma COPD overlap with OSA and NSCLC with distant brain mets, cardiorenal syndrome, neuropathy. He had oncology evaluation with palliative goal due to leptomeningeal carcinomatosis and poor prognostication. 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 HE had craniotomy with tumor resection as well as chemoradiation therapy.  He is deconditioned.  He came in with confusion and weakness complaining of dysuria.  He had UA with signs of possible UTI.  He also had elevated cardiac biomarkers suggestive of cardiac dysfunction.  Due to complex pulmonary history and confusion.   He had CT chest last month with no new findings and smaller size of left apical nodule. There is also atelectasis. PCCM consultation for further evaluation and management.   06/12/24- patient seen at bedside.  He is asking for breakfast and coffee.  He feels uncomfortable.  He is asking to get out of bed and sit in chair.  He understands there is no treatment for his cancer. He wants to start PT and get stronger. He is on room air.  I was able to dial room service and we ordered breakfast for him.  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   right lateral hand, treating with 5FU/calcipotriene   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   vertex scalp, treating with 5FU/calcipotriene     SURGICAL HISTORY   Past Surgical History:  Procedure Laterality Date   APPLICATION OF CRANIAL NAVIGATION Right 05/08/2024   Procedure: COMPUTER-ASSISTED NAVIGATION, FOR CRANIAL PROCEDURE;  Surgeon: [name] Dino HERO, MD;  Location: [facility] OR;  Service: Neurosurgery;  Laterality: Right;   COLON SURGERY     COLONOSCOPY WITH PROPOFOL  N/A 10/20/2020   Procedure: COLONOSCOPY WITH PROPOFOL ;  Surgeon: Toledo, Ladell POUR, MD;  Location: [facility] ENDOSCOPY;  Service: Gastroenterology;  Laterality: N/A;   CRANIOTOMY Right 05/08/2024   Procedure: CRANIOTOMY TUMOR EXCISION;  Surgeon: [name] Dino HERO, MD;  Location: [facility] OR;  Service: Neurosurgery;  Laterality: Right;     FAMILY HISTORY   Family History  Problem Relation Age of Onset   Hypertension Mother    Hypertension Father      SOCIAL HISTORY   Social History   Tobacco Use   Smoking status: Former    Current packs/day: 1.00    Average packs/day: 1 pack/day for 47.0 years (47.0 ttl pk-yrs)    Types: Cigarettes   Smokeless tobacco:  Never  Vaping Use   Vaping status: Never Used  Substance Use Topics   Alcohol  use: Never   Drug use: Never     MEDICATIONS    Home Medication:    Current Medication:  Current Facility-Administered Medications:    acetaminophen  (TYLENOL ) tablet 650 mg, 650 mg, Oral, Q6H PRN, Niu, Xilin, MD   albuterol  (PROVENTIL ) (2.5 MG/3ML) 0.083% nebulizer solution 2.5 mg, 2.5 mg, Inhalation, Q4H PRN, Niu, Xilin, MD   apixaban  (ELIQUIS ) tablet 5 mg, 5 mg, Oral, BID, Niu, Xilin, MD, 5 mg at 06/11/24 2340   atorvastatin  (LIPITOR ) tablet 80 mg, 80 mg, Oral, Daily, Niu, Xilin, MD, 80 mg at 06/11/24 0846   Chlorhexidine  Gluconate Cloth 2 % PADS 6 each, 6  each, Topical, Daily, Amin, Sumayya, MD, 6 each at 06/11/24 1755   cholecalciferol  (VITAMIN D3) 25 MCG (1000 UNIT) tablet 1,000 Units, 1,000 Units, Oral, Daily, Niu, Xilin, MD, 1,000 Units at 06/11/24 0846   clonazePAM  (KLONOPIN ) tablet 0.5 mg, 0.5 mg, Oral, TID PRN, Niu, Xilin, MD, 0.5 mg at 06/10/24 2049   dexamethasone  (DECADRON ) tablet 4 mg, 4 mg, Oral, Daily, Niu, Xilin, MD, 4 mg at 06/11/24 0846   dextromethorphan -guaiFENesin  (MUCINEX  DM) 30-600 MG per 12 hr tablet 1 tablet, 1 tablet, Oral, BID PRN, Niu, Xilin, MD   DULoxetine  (CYMBALTA ) DR capsule 60 mg, 60 mg, Oral, Daily, Niu, Xilin, MD, 60 mg at 06/11/24 9153   fluticasone  furoate-vilanterol (BREO ELLIPTA ) 100-25 MCG/ACT 1 puff, 1 puff, Inhalation, Daily, Niu, Xilin, MD, 1 puff at 06/11/24 0848   hydrALAZINE  (APRESOLINE ) injection 5 mg, 5 mg, Intravenous, Q2H PRN, Niu, Xilin, MD   ipratropium (ATROVENT ) 0.03 % nasal spray 1 spray, 1 spray, Nasal, BID PRN, Niu, Xilin, MD   levETIRAcetam  (KEPPRA ) tablet 500 mg, 500 mg, Oral, BID, Niu, Xilin, MD, 500 mg at 06/11/24 2340   metoprolol  tartrate (LOPRESSOR ) tablet 12.5 mg, 12.5 mg, Oral, BID, Amin, Sumayya, MD, 12.5 mg at 06/11/24 2008   montelukast  (SINGULAIR ) tablet 10 mg, 10 mg, Oral, Daily, Niu, Xilin, MD, 10 mg at 06/11/24 0846   ondansetron  (ZOFRAN ) injection 4 mg, 4 mg, Intravenous, Q8H PRN, Niu, Xilin, MD   oxyCODONE  (Oxy IR/ROXICODONE ) immediate release tablet 10 mg, 10 mg, Oral, Q4H PRN, Niu, Xilin, MD, 10 mg at 06/12/24 9394   pantoprazole  (PROTONIX ) EC tablet 40 mg, 40 mg, Oral, Daily, Niu, Xilin, MD, 40 mg at 06/11/24 0846   polyethylene glycol (MIRALAX  / GLYCOLAX ) packet 17 g, 17 g, Oral, BID, Niu, Xilin, MD, 17 g at 06/11/24 2340   pregabalin  (LYRICA ) capsule 150 mg, 150 mg, Oral, TID, Niu, Xilin, MD, 150 mg at 06/11/24 2340   senna-docusate (Senokot-S) tablet 1 tablet, 1 tablet, Oral, QHS, Niu, Xilin, MD, 1 tablet at 06/11/24 2340   tamsulosin  (FLOMAX ) capsule 0.4 mg, 0.4 mg,  Oral, QPC supper, Niu, Xilin, MD, 0.4 mg at 06/11/24 1734   tiZANidine  (ZANAFLEX ) tablet 2 mg, 2 mg, Oral, TID, Niu, Xilin, MD, 2 mg at 06/11/24 2340   traZODone  (DESYREL ) tablet 50 mg, 50 mg, Oral, QHS PRN, Niu, Xilin, MD, 50 mg at 06/10/24 2051    ALLERGIES   Patient has no known allergies.     REVIEW OF SYSTEMS    Review of Systems:  Gen:  Denies  fever, sweats, chills weigh loss  HEENT: Denies blurred vision, double vision, ear pain, eye pain, hearing loss, nose bleeds, sore throat Cardiac:  No dizziness, chest pain or heaviness, chest tightness,edema Resp:   reports dyspnea  chronically  Gi: Denies swallowing difficulty, stomach pain, nausea or vomiting, diarrhea, constipation, bowel incontinence Gu:  Denies bladder incontinence, burning urine Ext:   Denies Joint pain, stiffness or swelling Skin: Denies  skin rash, easy bruising or bleeding or hives Endoc:  Denies polyuria, polydipsia , polyphagia or weight change Psych:   Denies depression, insomnia or hallucinations   Other:  All other systems negative   VS: BP (!) 151/98 (BP Location: Left Arm)   Pulse 86   Temp 98.1 F (36.7 C) (Oral)   Resp 16   Ht 5' 7 (1.702 m)   Wt 81.8 kg   SpO2 93%   BMI 28.24 kg/m      PHYSICAL EXAM    GENERAL:NAD, no fevers, chills, no weakness no fatigue HEAD: Normocephalic, atraumatic.  EYES: Pupils equal, round, reactive to light. Extraocular muscles intact. No scleral icterus.  MOUTH: Moist mucosal membrane. Dentition intact. No abscess noted.  EAR, NOSE, THROAT: Clear without exudates. No external lesions.  NECK: Supple. No thyromegaly. No nodules. No JVD.  PULMONARY: decreased breath sounds with mild rhonchi worse at bases bilaterally.  CARDIOVASCULAR: S1 and S2. Regular rate and rhythm. No murmurs, rubs, or gallops. No edema. Pedal pulses 2+ bilaterally.  GASTROINTESTINAL: Soft, nontender, nondistended. No masses. Positive bowel sounds. No hepatosplenomegaly.   MUSCULOSKELETAL: No swelling, clubbing, or edema. Range of motion full in all extremities.  NEUROLOGIC: Cranial nerves II through XII are intact. No gross focal neurological deficits. Sensation intact. Reflexes intact.  SKIN: No ulceration, lesions, rashes, or cyanosis. Skin warm and dry. Turgor intact.  PSYCHIATRIC: Mood, affect within normal limits. The patient is awake, alert and oriented x 3. Insight, judgment intact.       IMAGING   Study Result  Narrative & Impression  CLINICAL DATA:  Cough   EXAM: PORTABLE CHEST 1 VIEW   COMPARISON:  CT chest dated 05/06/2024   FINDINGS: Normal lung volumes. Bibasilar hazy and patchy opacities, right-greater-than-left. Bilateral upper lung lucency. No pleural effusion or pneumothorax. The heart size and mediastinal contours are within normal limits. No acute osseous abnormality.   IMPRESSION: 1. Bibasilar hazy and patchy opacities, right-greater-than-left, suspicious for aspiration or pneumonia. 2. Bilateral upper lung lucency, likely related to emphysema.     Electronically Signed   By: Limin  Xu M.D.   On: 05/23/2024 17:38      ASSESSMENT/PLAN   NSCLC with distant metastasis  -s/p oncology and palliative evaluation   - no additional therapy is offered with palliative goal -consider hospice evaluation  Asthma COPD overlap with OSA   Patient may use inhouse CPAP overnight   Bibasilar atelectasis  - consider Metaneb recuritment if patient able to participate  - Incentive spirometry - OT/PT    Acute on chronic systolic CHF       -diuresis as able to tolerate       Thank you for allowing me to participate in the care of this patient.   Patient/Family are satisfied with care plan and all questions have been answered.    Provider disclosure: Patient with at least one acute or chronic illness or injury that poses a threat to life or bodily function and is being managed actively during this encounter.  All of the  below services have been performed independently by signing provider:  review of prior documentation from internal and or external health records.  Review of previous and current lab results.  Interview and comprehensive assessment during patient visit today. Review of current and previous  chest radiographs/CT scans. Discussion of management and test interpretation with health care team and patient/family.   This document was prepared using Dragon voice recognition software and may include unintentional dictation errors.     [place], M.D.  Division of Pulmonary & Critical Care Medicine

## 2024-06-12 NOTE — Progress Notes (Signed)
Occupational Therapy Treatment Patient Details Name: Roger Black MRN: [id number] DOB: [birth year] Today's Date: 06/12/2024   History of present illness Roger Black is a 68 y.o. male with medical history significant of HTN, HLD, COPD/asthma, sCHF, depression, CKD-3A, DVT on Eliquis, neuropathy, thrombocytopenia, anemia, leptomeningeal carcinoma, non-small cell lung cancer with brain metastasis underwent craniotomy with tumor resection on 05/08/24. Pt presents with weakness and fall   OT comments  Pt seen for OT/PT co treatment on this date. Upon arrival to room pt seated in recliner, agreeable to tx. Pt eager to become comfortable in the bed, appearing visibly fatigued with his eyes closing and increased confusion as transfer training completed. Attempted to squat pivot, pt unable to tolerated with MAXA+2 throughout multiple attempts, utilized stedy with MAXA+2 for successful transfer from recliner into bed. Pt requires MAXA for pericare while in stedy for support, unable to remain standing >15 seconds with despite consistent physical supports. Pt making good progress toward goals, will continue to follow POC. Discharge recommendation remains appropriate.        If plan is discharge home, recommend the following:  Two people to help with walking and/or transfers;Two people to help with bathing/dressing/bathroom   Equipment Recommendations  Hospital bed          Precautions / Restrictions Precautions Precautions: Fall Recall of Precautions/Restrictions: Impaired Restrictions Weight Bearing Restrictions Per Provider Order: No       Mobility Bed Mobility Overal bed mobility: Needs Assistance         Sit to supine: Max assist, +2 for safety/equipment   General bed mobility comments: Pt tolerated static sitting on the EOB, fatigues quickly, requires MAXA+2 for returning to supine    Transfers Overall transfer level: Needs assistance Equipment used: Rolling walker  (2 wheels) Transfers: Sit to/from Stand Sit to Stand: Max assist, +2 physical assistance, +2 safety/equipment           General transfer comment: MAXA+2 STS from recliner requiring multiple attempts to come to full stance, physical assistance for bilateral LE placement Transfer via Lift Equipment: Stedy   Balance Overall balance assessment: Needs assistance Sitting-balance support: Feet supported Sitting balance-Leahy Scale: Fair     Standing balance support: Bilateral upper extremity supported, During functional activity, Reliant on assistive device for balance Standing balance-Leahy Scale: Poor                             ADL either performed or assessed with clinical judgement   ADL Overall ADL's : Needs assistance/impaired Eating/Feeding: Supervision/ safety;Sitting                   Lower Body Dressing: Total assistance;Sitting/lateral leans   Toilet Transfer: Maximal assistance;+2 for safety/equipment;+2 for physical assistance Toilet Transfer Details (indicate cue type and reason): Simulated toilet t/f via stedy transfers from recliner into bed Toileting- Clothing Manipulation and Hygiene: Maximal assistance;Sitting/lateral lean       Functional mobility during ADLs: Maximal assistance;+2 for physical assistance;+2 for safety/equipment (Via stedy) General ADL Comments: Heavy MAXA+2 physical assist for simulated toilet transfer     Communication Communication Communication: Impaired Factors Affecting Communication: Difficulty expressing self   Cognition Arousal: Lethargic Behavior During Therapy: Flat affect Cognition: No family/caregiver present to determine baseline             OT - Cognition Comments: A/Ox1                 Following commands:  Impaired Following commands impaired: Follows one step commands inconsistently      Cueing   Cueing Techniques: Verbal cues, Gestural cues, Tactile cues  Exercises Exercises: Other  exercises Other Exercises Other Exercises: Edu: Role of OT session, technique for stedy use           General Comments Bleeding on pt buttocks, RN aware    Pertinent Vitals/ Pain       Pain Assessment Pain Assessment: Faces Faces Pain Scale: Hurts even more Pain Location: Bilateral LE pain Pain Descriptors / Indicators: Grimacing Pain Intervention(s): Limited activity within patient's tolerance, Monitored during session, Repositioned                                                          Frequency  Min 2X/week        Progress Toward Goals  OT Goals(current goals can now be found in the care plan section)  Progress towards OT goals: Progressing toward goals  Acute Rehab OT Goals OT Goal Formulation: Patient unable to participate in goal setting Time For Goal Achievement: 07/06/2024 Potential to Achieve Goals: Fair ADL Goals Pt Will Perform Eating: with supervision;bed level Pt Will Perform Grooming: with mod assist;sitting Pt Will Transfer to Toilet: with min assist  Plan      Co-evaluation    PT/OT/SLP Co-Evaluation/Treatment: Yes Reason for Co-Treatment: Complexity of the patient's impairments (multi-system involvement)          AM-PAC OT 6 Clicks Daily Activity     Outcome Measure   Help from another person eating meals?: A Little Help from another person taking care of personal grooming?: A Lot Help from another person toileting, which includes using toliet, bedpan, or urinal?: A Lot Help from another person bathing (including washing, rinsing, drying)?: A Lot Help from another person to put on and taking off regular upper body clothing?: A Lot Help from another person to put on and taking off regular lower body clothing?: A Lot 6 Click Score: 13    End of Session Equipment Utilized During Treatment: Other (comment) Laurent)  OT Visit Diagnosis: Other abnormalities of gait and mobility (R26.89);Muscle weakness  (generalized) (M62.81)   Activity Tolerance Patient limited by lethargy   Patient Left in bed;with call bell/phone within reach;with bed alarm set   Nurse Communication Mobility status        Time: 8971-8948 OT Time Calculation (min): 23 min  Charges: OT General Charges $OT Visit: 1 Visit OT Treatments $Self Care/Home Management : 8-22 mins  Larraine Colas M.S. OTR/L  06/12/24, 1:16 PM

## 2024-06-12 NOTE — Progress Notes (Signed)
 [facility] [employer] [employer] [facility]) Hospital Liaison Note   Received a referral from [facility], Lauraine Carpen, LCSW to meet with spouse at bedside to explain hospice services at home, LTC and  IPU. HL met spouse at bedside and provided extensive education for hospice services.  Spouse reported that she is unable to care for patient at home.  She does not have any additional support to assist.  Patient is not IPU appropriate at this time.    Spouse is hopeful that patient will qualify for SNF rehab with palliative follow up.  She is not sure she can afford LTC cost.  She is aware that Hospice can follow patient at LTC.     [facility] information and contact numbers given to spouse & above information shared with TOC.   Please call with any questions/concerns.    Thank you for the opportunity to participate in this patient's care.  [facility] Liaison [phone]

## 2024-06-12 NOTE — Progress Notes (Signed)
 Physical Therapy Treatment Patient Details Name: Roger Black MRN: [id number] DOB: [birth year] Today's Date: 06/12/2024   History of Present Illness Roger Black is a 68 y.o. male with medical history significant of HTN, HLD, COPD/asthma, sCHF, depression, CKD-3A, DVT on Eliquis, neuropathy, thrombocytopenia, anemia, leptomeningeal carcinoma, non-small cell lung cancer with brain metastasis underwent craniotomy with tumor resection on 05/08/24. Pt presents with weakness and fall    PT Comments  Patient seen in conjunction with OT to maximize patient's tolerance to activity. Seated in recliner on arrival. Attempted sit to stand from recliner with RW, however patient unable to stand despite maxA+2 on multiple attempts. Utilized [place] and [place] to stand and transfer to bed. Unable to come into full upright standing in [place] despite assistance. Unable to stand >15 seconds in [place] before becoming fatigued. Returned to supine with maxA+2. Limited progress towards goals at this time. Discharge plan remains appropriate.     If plan is discharge home, recommend the following: Two people to help with walking and/or transfers;Two people to help with bathing/dressing/bathroom;Assistance with cooking/housework;Direct supervision/assist for medications management;Assist for transportation;Direct supervision/assist for financial management;Help with stairs or ramp for entrance;Supervision due to cognitive status   Can travel by private vehicle     No  Equipment Recommendations  [place] lift    Recommendations for Other Services       Precautions / Restrictions Precautions Precautions: Fall Recall of Precautions/Restrictions: Impaired Restrictions Weight Bearing Restrictions Per Provider Order: No     Mobility  Bed Mobility Overal bed mobility: Needs Assistance         Sit to supine: Max assist, +2 for safety/equipment, +2 for physical assistance   General bed mobility comments:  Pt tolerated static sitting on the EOB, fatigues quickly, requires MAXA+2 for returning to supine    Transfers Overall transfer level: Needs assistance Equipment used: Rolling Yusef Lamp (2 wheels) Transfers: Sit to/from Stand Sit to Stand: Max assist, +2 physical assistance, +2 safety/equipment           General transfer comment: MAXA+2 STS from recliner requiring multiple attempts to come to full stance, physical assistance for bilateral LE placement Transfer via Lift Equipment: Stedy  Ambulation/Gait                   Stairs             Wheelchair Mobility     Tilt Bed    Modified Rankin (Stroke Patients Only)       Balance Overall balance assessment: Needs assistance Sitting-balance support: Feet supported Sitting balance-Leahy Scale: Fair     Standing balance support: Bilateral upper extremity supported, During functional activity, Reliant on assistive device for balance Standing balance-Leahy Scale: Poor                              Communication Communication Communication: Impaired Factors Affecting Communication: Difficulty expressing self  Cognition Arousal: Lethargic Behavior During Therapy: Flat affect   PT - Cognitive impairments: No family/caregiver present to determine baseline, History of cognitive impairments                         Following commands: Impaired Following commands impaired: Follows one step commands inconsistently    Cueing Cueing Techniques: Verbal cues, Gestural cues, Tactile cues  Exercises      General Comments General comments (skin integrity, edema, etc.): Bleeding on pt buttocks, RN aware  Pertinent Vitals/Pain Pain Assessment Pain Assessment: Faces Faces Pain Scale: Hurts even more Pain Location: Bilateral LE pain Pain Descriptors / Indicators: Grimacing Pain Intervention(s): Limited activity within patient's tolerance, Monitored during session    Home Living                           Prior Function            PT Goals (current goals can now be found in the care plan section) Acute Rehab PT Goals PT Goal Formulation: Patient unable to participate in goal setting Time For Goal Achievement: 06/22/24 Progress towards PT goals: Progressing toward goals    Frequency    Min 1X/week      PT Plan      Co-evaluation PT/OT/SLP Co-Evaluation/Treatment: Yes Reason for Co-Treatment: Complexity of the patient's impairments (multi-system involvement) PT goals addressed during session: Mobility/safety with mobility;Balance        AM-PAC PT 6 Clicks Mobility   Outcome Measure  Help needed turning from your back to your side while in a flat bed without using bedrails?: Total Help needed moving from lying on your back to sitting on the side of a flat bed without using bedrails?: Total Help needed moving to and from a bed to a chair (including a wheelchair)?: Total Help needed standing up from a chair using your arms (e.g., wheelchair or bedside chair)?: Total Help needed to walk in hospital room?: Total Help needed climbing 3-5 steps with a railing? : Total 6 Click Score: 6    End of Session   Activity Tolerance: Patient limited by lethargy Patient left: in bed;with call bell/phone within reach;with bed alarm set Nurse Communication: Mobility status PT Visit Diagnosis: Other abnormalities of gait and mobility (R26.89);Muscle weakness (generalized) (M62.81);Pain     Time: 8971-8948 PT Time Calculation (min) (ACUTE ONLY): 23 min  Charges:    $Therapeutic Activity: 8-22 mins PT General Charges $$ ACUTE PT VISIT: 1 Visit                     Maryanne Finder, PT, DPT Physical Therapist - [facility]  [facility]    Jordie Schreur A Jamale Spangler 06/12/2024, 2:12 PM

## 2024-06-12 NOTE — TOC CM/SW Note (Signed)
 Per palliative NP, family is interested in hospice and wants [facility]. Liaison is aware. Team does not believe patient meets criteria for hospice home. [facility] liaison will go by the room around noon to see if family is available.  Lauraine Carpen, CSW [phone]

## 2024-06-12 NOTE — Progress Notes (Signed)
 Speech Language Pathology Treatment: Dysphagia  Patient Details Name: Roger Black MRN: [id number] DOB: Apr 14, 1956 Today's Date: 06/12/2024 Time: 8744-8654 SLP Time Calculation (min) (ACUTE ONLY): 50 min  Assessment / Plan / Recommendation Clinical Impression  Pt seen for ongoing assessment of pt's toleration of oral diet at Lunch meal today. No Family present this session. Pt awake, verbal but Confused though tangential thoughts/responses noted today vs yesterday at BSE. He required redirection to tasks and to complete basic 1-step commands but less frustration noted. He agreed to some bites/sips from the Lunch meal after assisting him w/ Sitting Fully Upright for oral intake vs lying reclined in bed.   Pt appears to present w/ oral phase swallowing issues/dysphagia in setting of declined Cognitive status- ANY Cognitive decline can impact overall awareness/timing of oral phase, swallowing, and safety during po tasks which increases risk for aspiration, choking. Suspect impact from pt's leptomeningeal carcinomatosis, brain mets w/ craniotomy and tumor resection as well as chemoradiation therapy, Confusion, and dependency on tray setup and positioning for eating/drinking on his overall engagement in oral intake.    Pt's risk for aspiration can be reduced when following general aspiration precautions and avoiding any problematic foods requiring increased Cognitive attention/focus- identifying pt's food Preferences would be most beneficial and ensuring his Dentures are placed for effective mastication of solids. Support w/ verbal/visual cues for follow through during po tasks and self-feeding is key also.        Given encouragement, pt fed self trials of purees, softened solids as well as tougher meats(pot roast), and thin liquids via Cup w/ No overt, clinical s/s of aspiration noted: no decline in vocal quality, no cough, and no decline in respiratory status during/post trials. Oral phase was c/b  [facility] attention to, and awareness of, boluses for bolus management and oral clearing of the boluses. Mastication of more solid trials(meat) required increased Time and effort despite cutting the meat and moistening it.  Pt was able to self-feeding w/ setup and verbal/visual cues but required min-mod support and guidance d/t the Cognitive decline and general weakness. He responded to cues more appropriately today.         In setting of Cognitive decline, recommend general diet of Regular>Mech Soft foods for Pleasure but CHOPPED meats and moistened foods in general; thin liquids via CUP. Finding foods of Pleasure for pt is Important to increase oral intake. Recommend general aspiration precautions- reduce Distractions during meals and engage pt for self-feeding. Full Tray Setup and Sitting Up support. Give Encouragement to eat/drink. Sit fully upright for all oral intake. Pills Crushed vs Whole in Puree for safer, efficient swallowing. Support w/ feeding at meals as needed.    ST services can be available for further education, instructions as needed while admitted per MD order. Recommend follow-up w/ Palliative Care for GOC/POC and education re: impact of Cognitive decline on oral intake(desire) overall as well as on swallowing. Dietician f/u as needed to optimize nutrition. Precautions posted in room, chart. NSG updated.     HPI HPI: Pt is a 68 y.o. 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   Patient's status post craniotomy with tumor resection followed by XRT.   Patient was hospitalized 04/28/2024 to 05/15/2024 and was discharged to inpatient rehab where he stayed until 06/03/2024.  Patient now readmitted 06/09/2024 with weakness and Fall.  Palliative care consulted to address goals.  Head CT: No acute intracranial abnormality.  2. Similar appearing  bilateral frontal and left cerebelalr vasogenic edema.  No CXR.      SLP Plan  All goals met          Recommendations  Diet recommendations: Thin liquid;Regular (some mech soft foods(meats)) Liquids provided via: Cup;No straw Medication Administration: Crushed with puree (vs Whole in Puree) Supervision: Patient able to self feed;Intermittent supervision to cue for compensatory strategies (setup and support) Compensations: Minimize environmental distractions;Slow rate;Small sips/bites;Follow solids with liquid Postural Changes and/or Swallow Maneuvers: Out of bed for meals;Seated upright 90 degrees;Upright 30-60 min after meal                 (Dietician) Oral care BID;Staff/trained caregiver to provide oral care (support pt w/ oral care)     Dysphagia, oral phase (R13.11) (impact from leptomeningeal carcinomatosis, brain mets w/ craniotomy and tumor resection as well as chemoradiation therapy; dependency on feeding; Confusion)     All goals met       Comer Portugal, MS, CCC-SLP Speech Language Pathologist Rehab Services; [facility] [place] [phone] (ascom) Evonne Rinks  06/12/2024, 5:57 PM

## 2024-06-13 DIAGNOSIS — R531 Weakness: Secondary | ICD-10-CM | POA: Diagnosis not present

## 2024-06-13 LAB — GLUCOSE, CAPILLARY
Glucose-Capillary: 111 mg/dL — ABNORMAL HIGH (ref 70–99)
Glucose-Capillary: 136 mg/dL — ABNORMAL HIGH (ref 70–99)
Glucose-Capillary: 166 mg/dL — ABNORMAL HIGH (ref 70–99)
Glucose-Capillary: 149 mg/dL — ABNORMAL HIGH (ref 70–99)

## 2024-06-13 NOTE — Plan of Care (Signed)

## 2024-06-13 NOTE — Progress Notes (Signed)
   06/13/24 1500  Spiritual Encounters  Type of Visit Follow up  Care provided to: Pt and family  Referral source Patient request  Reason for visit Routine spiritual support  OnCall Visit Yes  Spiritual Framework  Presenting Themes Values and beliefs  Community/Connection Family;Spiritual leader  Patient Stress Factors None identified  Family Stress Factors None identified  Interventions  Spiritual Care Interventions Made Compassionate presence;Reflective listening;Explored values/beliefs/practices/strengths  Intervention Outcomes  Outcomes Connection to spiritual care  Spiritual Care Plan  Spiritual Care Issues Still Outstanding No further spiritual care needs at this time (see row info)   Chaplain followed up with patient after seeing him this morning to see if there were any other spiritual care needs.  Patient, with spouse at bedside, asked Chaplain faith related questions.  Rev. Rana M. Nicholaus, M.Div. Chaplain Resident [facility]

## 2024-06-13 NOTE — Progress Notes (Signed)
 PROGRESS NOTE    Roger Black  FMW:[id number] DOB: 01/30/1956 DOA: 06/09/2024 PCP: [name], MD  118A/[id number]-AA  LOS: 3 days   Brief hospital course:   Assessment & Plan: Roger Black is a 68 y.o. 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   Patient recently had a long and complicated admission from 10/6 - 11/11 due to leptomeningeal carcinomatosis, bilateral lower extremity pain, NSCLC metastasized to brain.  Patient is s/p of craniotomy with brain tumor resection.  Patient is s/p of palliative radiation therapy, currently not on chemotherapy.  Patient was discharged to CIR on 10/23 and then home on 11/11.   Since home patient has progressive weakness and now unable to walk.  Had a fall on 11/12.   He has difficulty urinating, found to have acute urinary retention with 478 mL in the ED. Foley catheter was placed.  * Generalized weakness Falls. Patient with worsening generalized weakness and unable to walk or do ADLs now.  Recently discharged from CIR when he was able to walk some with the help of walker before discharge. --History of stage IV lung cancer with progression, recent craniotomy and palliative radiation to brain and cervical spine.  Also has leptomeningeal spread. --Palliative care was consulted. 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   Not on any chemotherapy at this time. -Continuing home Decadron  and Keppra  --onc palliative care consulted, rec hospice   Acute urinary retention Patient with history of prior urinary retention which was initially thought to be due to cauda  equina, later he was able to void and discharged home without Foley. --Now back with urinary retention requiring Foley catheter which was placed in ED. Received 1 dose of ceftriaxone  which was not continued, recently treated for pansensitive E. coli UTI. -Repeat blood cultures obtained and pending -Continue with Flomax  -Patient need to follow-up with outpatient urology for voiding trial   Benign hypertension Blood pressure within goal. --cont lopressor      Chronic systolic CHF (congestive heart failure) (HCC)  2D echo on 02/23/2024 showed EF of 45%.  His on torsemide and spironolactone  were on hold from recent admission.  Patient has mild leg edema, but BNP is only 502.  No SOB.  --cont lopressor    COPD (chronic obstructive pulmonary disease) (HCC) No current concern of exacerbation, no wheezing. --cont bronchodilators   HLD (hyperlipidemia) - Continue Lipitor    Hypokalemia --monitor and supplement PRN   CKD stage 3a, ruled out CKD2 GFR above 60.     Thrombocytopenia Seems chronic, platelets of 57 today. Likely related to his advanced malignancy -Continue to monitor   History of DVT (deep vein thrombosis) - Needed close monitoring due to thrombocytopenia. --cont Eliquis     DVT prophylaxis: On:Eliquis  Code Status: DNR  Family Communication: wife updated at bedside today Level of care: Telemetry Dispo:   The patient is from: home Anticipated d/c is to: to be determined Anticipated d/c date is: when disposition found   Subjective and Interval History:  No new event today.   Objective: Vitals:   06/13/24 0838 06/13/24 0945 06/13/24 1600 06/13/24 1939  BP: (!) 135/100  (!) 132/99 112/85  Pulse: 94  79 85  Resp: 17  16 18  Temp:  97.7 F (36.5 C) 99.2 F (37.3 C) 98.1 F (36.7 C)  TempSrc:  Oral  Oral  SpO2: 93%  95% 94%  Weight:      Height:        Intake/Output Summary (Last 24 hours) at 06/13/2024 2157 Last data filed at 06/13/2024 1900 Gross per 24  hour  Intake 180 ml  Output 450 ml  Net -270 ml   Filed Weights   06/11/24 0318 06/12/24 0450 06/13/24 0500  Weight: 61.9 kg 81.8 kg 79.7 kg    Examination:   Constitutional: NAD CV: No cyanosis.   RESP: normal respiratory effort, on RA   Data Reviewed: I have personally reviewed labs and imaging studies  Time spent: 25 minutes  Ellouise Haber, MD [facility] If 7PM-7AM, please contact night-coverage 06/13/2024, 9:57 PM

## 2024-06-13 NOTE — Care Management Important Message (Signed)
 Important Message  Patient Details  Name: Roger Black MRN: [id number] Date of Birth: 12-11-1955   Important Message Given:  Yes - Medicare IM     [name] W, CMA 06/13/2024, 1:14 PM

## 2024-06-13 NOTE — Plan of Care (Signed)
   Problem: Activity: Goal: Risk for activity intolerance will decrease Outcome: Progressing   Problem: Nutrition: Goal: Adequate nutrition will be maintained Outcome: Progressing   Problem: Pain Managment: Goal: General experience of comfort will improve and/or be controlled Outcome: Progressing   Problem: Safety: Goal: Ability to remain free from injury will improve Outcome: Progressing

## 2024-06-13 NOTE — Progress Notes (Signed)
   06/13/24 0930  Spiritual Encounters  Type of Visit Initial  Care provided to: [facility] partners present during encounter Nurse  Referral source Other (comment) Tax Adviser)  Reason for visit Routine spiritual support  OnCall Visit Yes  Spiritual Framework  Presenting Themes Impactful experiences and emotions;Rituals and practive  Community/Connection Family;Friend(s);Significant other  Needs/Challenges/Barriers Patient seemed to want to share feelings but was pretty reserved in the presence of others.  Interventions  Spiritual Care Interventions Made Compassionate presence;Prayer;Reflective listening  Intervention Outcomes  Outcomes Connection to spiritual care (Chaplain assesses there is a need for spiritual care.)  Spiritual Care Plan  Spiritual Care Issues Still Outstanding Chaplain will continue to follow  Follow up plan  Chaplain will return late afternoon to see patient.   Chaplain visited patient per staff suggestion and found stepdaughter and friend at bedside.  Chaplain will visit patient late afternoon for follow-up.    Rev. Rana M. Nicholaus, M.Div. Chaplain Resident [facility]

## 2024-06-14 DIAGNOSIS — R531 Weakness: Secondary | ICD-10-CM | POA: Diagnosis not present

## 2024-06-14 NOTE — Progress Notes (Signed)
 PROGRESS NOTE    Roger Black  FMW:[id number] DOB: 1955-08-24 DOA: 06/09/2024 PCP: Roger [name], MD  118A/118A-AA  LOS: 4 days   Brief hospital course:   Assessment & Plan: Roger Black is a 68 y.o. 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   Patient recently had a long and complicated admission from 10/6 - 11/11 due to leptomeningeal carcinomatosis, bilateral lower extremity pain, NSCLC metastasized to brain.  Patient is s/p of craniotomy with brain tumor resection.  Patient is s/p of palliative radiation therapy, currently not on chemotherapy.  Patient was discharged to CIR on 10/23 and then home on 11/11.   Since home patient has progressive weakness and now unable to walk.  Had a fall on 11/12.   He has difficulty urinating, found to have acute urinary retention with 478 mL in the ED. Foley catheter was placed.  * Generalized weakness Falls. Patient with worsening generalized weakness and unable to walk or do ADLs now.  Recently discharged from CIR when he was able to walk some with the help of walker before discharge. --History of stage IV lung cancer with progression, recent craniotomy and palliative radiation to brain and cervical spine.  Also has leptomeningeal spread. --Palliative care was consulted. 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   Not on any chemotherapy at this time. -Continuing home Decadron  and Keppra  --onc palliative care consulted, rec hospice   Acute urinary retention Patient with history of prior urinary retention which was initially thought to be due to cauda  equina, later he was able to void and discharged home without Foley. --Now back with urinary retention requiring Foley catheter which was placed in ED. Received 1 dose of ceftriaxone  which was not continued, recently treated for pansensitive E. coli UTI. -Repeat blood cultures obtained and pending -Continue with Flomax  --cont Foley -Patient need to follow-up with outpatient urology for voiding trial   Benign hypertension Blood pressure within goal. --cont lopressor      Chronic systolic CHF (congestive heart failure) (HCC)  2D echo on 02/23/2024 showed EF of 45%.  His on torsemide and spironolactone  were on hold from recent admission.  Patient has mild leg edema, but BNP is only 502.  No SOB.  --cont lopressor    COPD (chronic obstructive pulmonary disease) (HCC) No current concern of exacerbation, no wheezing. --cont bronchodilators   HLD (hyperlipidemia) - Continue Lipitor    Hypokalemia --monitor and supplement PRN   CKD stage 3a, ruled out CKD2 GFR above 60.     Thrombocytopenia Seems chronic, platelets of 57. Likely related to his advanced malignancy -Continue to monitor   History of DVT (deep vein thrombosis) - Needed close monitoring due to thrombocytopenia. --cont Eliquis     DVT prophylaxis: On:Eliquis  Code Status: DNR  Family Communication: wife updated at bedside today Level of care: Telemetry Dispo:   The patient is from: home Anticipated d/c is to: to be determined Anticipated d/c date is: when disposition found   Subjective and Interval History:  Pt waiting on SNF placement because wife can't take care of him at home.   Objective: Vitals:   06/14/24 0432 06/14/24 1320 06/14/24 1700 06/14/24 1923  BP:  118/86 114/82 (!) 140/87  Pulse:  92 100 96  Resp:  20 14 16   Temp:  98.5 F (36.9 C) 98 F (36.7 C) 98 F (36.7 C)  TempSrc:  Oral Oral Oral  SpO2:  92% 95% 94%  Weight: 77 kg     Height:        Intake/Output Summary (Last 24 hours) at  06/14/2024 1939 Last data filed at 06/14/2024 1407 Gross per 24 hour  Intake 0 ml  Output 600 ml  Net -600 ml   Filed Weights   06/12/24 0450 06/13/24 0500 06/14/24 0432  Weight: 81.8 kg 79.7 kg 77 kg    Examination:   Constitutional: NAD, alert HEENT: conjunctivae and lids normal, EOMI CV: No cyanosis.   RESP: normal respiratory effort   Data Reviewed: I have personally reviewed labs and imaging studies  Time spent: 35 minutes  Ellouise Haber, MD [facility] If 7PM-7AM, please contact night-coverage 06/14/2024, 7:39 PM

## 2024-06-14 NOTE — NC FL2 (Signed)
 [place]  MEDICAID FL2 LEVEL OF CARE FORM     IDENTIFICATION  Patient Name: Roger Black Birthdate: 01-02-1956 Sex: male Admission Date (Current Location): 06/09/2024  [facility] and [place] Number:  Chiropodist and Address:         Provider Number: [id number]  Attending Physician Name and Address:  Awanda City, MD  Relative Name and Phone Number:       Current Level of Care: SNF Recommended Level of Care: Skilled Nursing Facility Prior Approval Number:    Date Approved/Denied:   PASRR Number: [id number]  Discharge Plan: SNF    Current Diagnoses: Patient Active Problem List   Diagnosis Date Noted   History of DVT (deep vein thrombosis) 06/10/2024   Palliative care encounter 06/10/2024   Generalized weakness 06/09/2024   Fall at home, initial encounter 06/09/2024   HLD (hyperlipidemia) 06/09/2024   Hypokalemia 06/09/2024   Acute urinary retention 06/09/2024   Thrombocytopenia 06/09/2024   COPD (chronic obstructive pulmonary disease) (HCC) 06/09/2024   Hyperlipidemia 06/05/2024   Allergic rhinitis 06/05/2024   Microscopic hematuria 06/05/2024   Obstructive sleep apnea 06/05/2024   Osteoarthritis 06/05/2024   Prediabetes 06/05/2024   Insomnia 06/04/2024   Malignant neoplasm metastatic to brain (HCC) 05/15/2024   Brain tumor (HCC) 05/08/2024   Brain mass 05/07/2024   Abnormal findings on diagnostic imaging of spine 04/29/2024   Back pain 04/29/2024   Leg pain 04/28/2024   Obesity (BMI 30-39.9) 04/28/2024   Chronic systolic CHF (congestive heart failure) (HCC)    Chronic kidney disease, stage 3a (HCC)    HFrEF (heart failure with reduced ejection fraction) (HCC) 03/11/2024   Pulmonary emphysema (HCC) 02/06/2024   Non-small cell cancer of left lung (HCC) 02/06/2024   Malignant neoplasm of upper lobe of left lung (HCC) 01/19/2023   Benign hypertension 07/21/2021   Aortic atherosclerosis 10/12/2016   Coronary artery calcification seen on CT scan  09/06/2016   Personal history of tobacco use, presenting hazards to health 09/21/2015    Orientation RESPIRATION BLADDER Height & Weight     Self, Time, Situation, Place  Normal Incontinent Weight: 77 kg Height:  5' 7 (170.2 cm)  BEHAVIORAL SYMPTOMS/MOOD NEUROLOGICAL BOWEL NUTRITION STATUS      Incontinent Diet  AMBULATORY STATUS COMMUNICATION OF NEEDS Skin   Extensive Assist Verbally Other (Comment)                       Personal Care Assistance Level of Assistance  Bathing, Dressing Bathing Assistance: Limited assistance   Dressing Assistance: Limited assistance     Functional Limitations Info             SPECIAL CARE FACTORS FREQUENCY  PT (By licensed PT), OT (By licensed OT)     PT Frequency: 2X/Week OT Frequency: 2X/Week            Contractures Contractures Info: Not present    Additional Factors Info  Code Status Code Status Info: DNR-Limited             Current Medications (06/14/2024):  This is the current hospital active medication list Current Facility-Administered Medications  Medication Dose Route Frequency Provider Last Rate Last Admin   acetaminophen  (TYLENOL ) tablet 650 mg  650 mg Oral Q6H PRN Niu, Xilin, MD       albuterol  (PROVENTIL ) (2.5 MG/3ML) 0.083% nebulizer solution 2.5 mg  2.5 mg Inhalation Q4H PRN Niu, Xilin, MD       apixaban  (ELIQUIS ) tablet 5 mg  5  mg Oral BID Niu, Xilin, MD   5 mg at 06/14/24 0833   atorvastatin  (LIPITOR ) tablet 80 mg  80 mg Oral Daily Niu, Xilin, MD   80 mg at 06/13/24 2051   Chlorhexidine  Gluconate Cloth 2 % PADS 6 each  6 each Topical Daily Amin, Sumayya, MD   6 each at 06/14/24 0839   cholecalciferol  (VITAMIN D3) 25 MCG (1000 UNIT) tablet 1,000 Units  1,000 Units Oral Daily Niu, Xilin, MD   1,000 Units at 06/14/24 9166   clonazePAM  (KLONOPIN ) tablet 0.5 mg  0.5 mg Oral TID PRN Niu, Xilin, MD   0.5 mg at 06/12/24 9082   dexamethasone  (DECADRON ) tablet 4 mg  4 mg Oral Daily Niu, Xilin, MD   4 mg at  06/14/24 9166   dextromethorphan -guaiFENesin  (MUCINEX  DM) 30-600 MG per 12 hr tablet 1 tablet  1 tablet Oral BID PRN Niu, Xilin, MD       DULoxetine  (CYMBALTA ) DR capsule 60 mg  60 mg Oral Daily Niu, Xilin, MD   60 mg at 06/14/24 9167   fluticasone  furoate-vilanterol (BREO ELLIPTA ) 100-25 MCG/ACT 1 puff  1 puff Inhalation Daily Niu, Xilin, MD   1 puff at 06/14/24 0831   hydrALAZINE  (APRESOLINE ) injection 5 mg  5 mg Intravenous Q2H PRN Niu, Xilin, MD       ipratropium (ATROVENT ) 0.03 % nasal spray 1 spray  1 spray Nasal BID PRN Niu, Xilin, MD       levETIRAcetam  (KEPPRA ) tablet 500 mg  500 mg Oral BID Niu, Xilin, MD   500 mg at 06/14/24 0833   metoprolol  tartrate (LOPRESSOR ) tablet 12.5 mg  12.5 mg Oral BID Amin, Sumayya, MD   12.5 mg at 06/14/24 9166   montelukast  (SINGULAIR ) tablet 10 mg  10 mg Oral Daily Niu, Xilin, MD   10 mg at 06/14/24 9166   ondansetron  (ZOFRAN ) injection 4 mg  4 mg Intravenous Q8H PRN Niu, Xilin, MD       oxyCODONE  (Oxy IR/ROXICODONE ) immediate release tablet 10 mg  10 mg Oral Q4H PRN Niu, Xilin, MD   10 mg at 06/14/24 1154   pantoprazole  (PROTONIX ) EC tablet 40 mg  40 mg Oral Daily Niu, Xilin, MD   40 mg at 06/14/24 0833   polyethylene glycol (MIRALAX  / GLYCOLAX ) packet 17 g  17 g Oral BID Niu, Xilin, MD   17 g at 06/14/24 9167   pregabalin  (LYRICA ) capsule 150 mg  150 mg Oral TID Niu, Xilin, MD   150 mg at 06/14/24 1504   senna-docusate (Senokot-S) tablet 1 tablet  1 tablet Oral QHS Niu, Xilin, MD   1 tablet at 06/13/24 2050   tamsulosin  (FLOMAX ) capsule 0.4 mg  0.4 mg Oral QPC supper Niu, Xilin, MD   0.4 mg at 06/13/24 1722   tiZANidine  (ZANAFLEX ) tablet 2 mg  2 mg Oral TID Niu, Xilin, MD   2 mg at 06/14/24 1504   traZODone  (DESYREL ) tablet 50 mg  50 mg Oral QHS PRN Niu, Xilin, MD   50 mg at 06/10/24 2051     Discharge Medications: Please see discharge summary for a list of discharge medications.  Relevant Imaging Results:  Relevant Lab Results:   Additional  Information: SS# [id number]    Victory Jackquline RAMAN, RN

## 2024-06-14 NOTE — TOC Progression Note (Addendum)
 Transition of Care [facility]) - Progression Note    Patient Details  Name: Roger Black MRN: [id number] Date of Birth: 1956-01-21  Transition of Care [facility]) CM/SW Contact  Victory Jackquline RAMAN, RN Phone Number: 06/14/2024, 3:56 PM  Clinical Narrative:    11:00 am: [facility] reviewed the chart and  met with patient at the bedside tom follow up with discharge planning. RNCM introduced role and explained that discharge planning would be discussed. PT is recommending STR. Patient is in agreement. RNCM asked for his wifes phone number and her gave it to me and also called her on his cell phone so I could speak with her. RNCM spoke to patient's wife [name], introduced role and explained that discharge planning would be discussed. RNCM asked her if she had decide where her husband was going to go once he's discharge. She informed me that she had spoken to hospice and that she can't handle him at home by herself. I explained to her that her husband is medially stable form discharge per MD and she needs to make a decision on where her is going to go. She said that she was going to spend the night at the hospital with her husband and that they would talk about it tonight and let me know in the morning. RNCM will continue to follow for discharge planning needs.  2:39 pm: RNCM received a message from Kara with [facility] via secure chat asking for updates on bed searches and RNCM told her that the wife hadn't made a decision on what she was going to do yet.  4:05 pm: RNCM completed FL2, PASRR# [id number], Referrals for Bed Offers submitted.   4:17pm: RNCM spoke to patient's son Coyle @ [phone], introduced role and explained that discharge planning would be discussed. PT is recommending STR and explained to him that we have been trying to talk to the patients wife [name] and she isn't understanding what we are saying and he confirmed that she unofficially has some cognitive issues. He said that he and his father  have a very distant relationship and he lives over 2 hours away and he can't assist with any of his father's care. I explain to him that if his father doesn't get accepted to a STR facility that [name] will have to take him home with her and if she refuses then we will have to get APS involved. He said that's fine that we need to do what we need to do. He said that [name] doesn't answer his calls.                      Expected Discharge Plan and Services                                               Social Drivers of Health (SDOH) Interventions SDOH Screenings   Food Insecurity: Patient Declined (06/10/2024)  Housing: Patient Declined (06/10/2024)  Recent Concern: Housing - High Risk (05/07/2024)  Transportation Needs: No Transportation Needs (06/10/2024)  Utilities: Patient Declined (06/10/2024)  Financial Resource Strain: Low Risk  (03/07/2024)   Received from [facility]  Recent Concern: Financial Resource Strain - High Risk (01/29/2024)   Received from [facility]  Physical Activity: Inactive (08/07/2023)   Received from [facility]  Social Connections: Patient Declined (06/10/2024)  Stress: Stress  Concern Present (08/07/2023)   Received from [facility]  Tobacco Use: Medium Risk (06/09/2024)  Health Literacy: Adequate Health Literacy (08/07/2023)   Received from [facility]    Readmission Risk Interventions     No data to display

## 2024-06-14 NOTE — Progress Notes (Signed)
 [facility] Liaison Note  Follow up on referral received for hospice at [facility] or palliative at [facility]  Wife states she is unable to care for patient at home.  She is hoping for STR.  Discussed with Dr. Awanda and Jackquline Shove RN/CM.  Patient bedsearch started today to see if patient will receive a bed offer.  Hospital liaison team will continue to follow and collaborate with hospital staff through final discharge plan.  Saddie HILARIO Na, Rn Nurse Liaison [phone]

## 2024-06-15 DIAGNOSIS — R531 Weakness: Secondary | ICD-10-CM | POA: Diagnosis not present

## 2024-06-15 MED ORDER — COLLAGENASE 250 UNIT/GM EX OINT
TOPICAL_OINTMENT | Freq: Every day | CUTANEOUS | Status: DC
  Filled 2024-06-15: qty 30

## 2024-06-15 NOTE — Plan of Care (Signed)
  Problem: Health Behavior/Discharge Planning: Goal: Ability to manage health-related needs will improve Outcome: Progressing   Problem: Nutrition: Goal: Adequate nutrition will be maintained Outcome: Progressing   Problem: Coping: Goal: Level of anxiety will decrease Outcome: Progressing   Problem: Elimination: Goal: Will not experience complications related to bowel motility Outcome: Progressing   Problem: Pain Managment: Goal: General experience of comfort will improve and/or be controlled Outcome: Progressing   Problem: Safety: Goal: Ability to remain free from injury will improve Outcome: Progressing   Problem: Skin Integrity: Goal: Risk for impaired skin integrity will decrease Outcome: Progressing

## 2024-06-15 NOTE — Consult Note (Signed)
 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  Apply 1/4 thick layer of Santyl  to wound bed daily and  top with saline moist gauze.  Cover with dry gauze and silicone foam.    POC discussed with bedside nurse. WOC team will follow every 7 to 10 days to assess wound and change POC as needed.    Thank you,    Demere Dotzler [phone]

## 2024-06-15 NOTE — Plan of Care (Signed)
   Problem: Education: Goal: Knowledge of General Education information will improve Description Including pain rating scale, medication(s)/side effects and non-pharmacologic comfort measures Outcome: Progressing

## 2024-06-15 NOTE — Progress Notes (Signed)
 PROGRESS NOTE    Roger Black  FMW:[id number] DOB: 09/06/1955 DOA: 06/09/2024 PCP: [name], MD  118A/118A-AA  LOS: 5 days   Brief hospital course:   Assessment & Plan: Roger Black is a 68 y.o. 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   Patient recently had a long and complicated admission from 10/6 - 11/11 due to leptomeningeal carcinomatosis, bilateral lower extremity pain, NSCLC metastasized to brain.  Patient is s/p of craniotomy with brain tumor resection.  Patient is s/p of palliative radiation therapy, currently not on chemotherapy.  Patient was discharged to CIR on 10/23 and then home on 11/11.   Since home patient has progressive weakness and now unable to walk.  Had a fall on 11/12.   He has difficulty urinating, found to have acute urinary retention with 478 mL in the ED. Foley catheter was placed.  * Generalized weakness Falls. Patient with worsening generalized weakness and unable to walk or do ADLs now.  Recently discharged from CIR when he was able to walk some with the help of walker before discharge. --History of stage IV lung cancer with progression, recent craniotomy and palliative radiation to brain and cervical spine.  Also has leptomeningeal spread. --Palliative care was consulted. 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   Not on any chemotherapy at this time. -Continuing home Decadron  and Keppra  --onc palliative care consulted, rec hospice   Acute urinary retention Patient with history of prior urinary retention which was initially thought to be due to cauda  equina, later he was able to void and discharged home without Foley. --Now back with urinary retention requiring Foley catheter which was placed in ED. Received 1 dose of ceftriaxone  which was not continued, recently treated for pansensitive E. coli UTI. -Repeat blood cultures obtained and pending -Continue with Flomax  --cont Foley -Patient need to follow-up with outpatient urology for voiding trial   Benign hypertension Blood pressure within goal. --cont lopressor      Chronic systolic CHF (congestive heart failure) (HCC)  2D echo on 02/23/2024 showed EF of 45%.  His on torsemide and spironolactone  were on hold from recent admission.  Patient has mild leg edema, but BNP is only 502.  No SOB.  --cont lopressor    COPD (chronic obstructive pulmonary disease) (HCC) No current concern of exacerbation, no wheezing. --cont bronchodilators   HLD (hyperlipidemia) - Continue Lipitor    Hypokalemia --monitor and supplement PRN   CKD stage 3a, ruled out CKD2 GFR above 60.     Thrombocytopenia Seems chronic, platelets of 57. Likely related to his advanced malignancy -Continue to monitor   History of DVT (deep vein thrombosis) - Needed close monitoring due to thrombocytopenia. --cont Eliquis     DVT prophylaxis: On:Eliquis  Code Status: DNR  Family Communication:  Level of care: Telemetry Dispo:   The patient is from: home Anticipated d/c is to: to be determined Anticipated d/c date is: when disposition found   Subjective and Interval History:  Pt appeared confused.   Objective: Vitals:   06/15/24 0500 06/15/24 0808 06/15/24 0827 06/15/24 1637  BP:  110/72  (!) 133/93  Pulse:  (!) 101 (!) 101 100  Resp:  18  17  Temp:  98.5 F (36.9 C)  98.1 F (36.7 C)  TempSrc:  Oral  Oral  SpO2:  90%  95%  Weight: 77 kg     Height:        Intake/Output Summary (Last 24 hours) at 06/15/2024 1756 Last data filed at 06/15/2024 0357 Gross per 24 hour  Intake --  Output 650 ml   Net -650 ml   Filed Weights   06/13/24 0500 06/14/24 0432 06/15/24 0500  Weight: 79.7 kg 77 kg 77 kg    Examination:   Constitutional: NAD, alert, confused HEENT: conjunctivae and lids normal, EOMI CV: No cyanosis.   RESP: normal respiratory effort   Data Reviewed: I have personally reviewed labs and imaging studies  Time spent: 25 minutes  Ellouise Haber, MD [facility] If 7PM-7AM, please contact night-coverage 06/15/2024, 5:56 PM

## 2024-06-16 DIAGNOSIS — R531 Weakness: Secondary | ICD-10-CM | POA: Diagnosis not present

## 2024-06-16 NOTE — Plan of Care (Signed)
   Problem: Education: Goal: Knowledge of General Education information will improve Description Including pain rating scale, medication(s)/side effects and non-pharmacologic comfort measures Outcome: Progressing

## 2024-06-16 NOTE — Progress Notes (Signed)
 PROGRESS NOTE    Roger Black  FMW:[id number] DOB: 09/07/1955 DOA: 06/09/2024 PCP: [name], MD  118A/118A-AA  LOS: 6 days   Brief hospital course:   Assessment & Plan: Roger Black is a 68 y.o. 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   Patient recently had a long and complicated admission from 10/6 - 11/11 due to leptomeningeal carcinomatosis, bilateral lower extremity pain, NSCLC metastasized to brain.  Patient is s/p of craniotomy with brain tumor resection.  Patient is s/p of palliative radiation therapy, currently not on chemotherapy.  Patient was discharged to CIR on 10/23 and then home on 11/11.   Since home patient has progressive weakness and now unable to walk.  Had a fall on 11/12.   He has difficulty urinating, found to have acute urinary retention with 478 mL in the ED. Foley catheter was placed.  * Generalized weakness Falls. Patient with worsening generalized weakness and unable to walk or do ADLs now.  Recently discharged from CIR when he was able to walk some with the help of walker before discharge. --History of stage IV lung cancer with progression, recent craniotomy and palliative radiation to brain and cervical spine.  Also has leptomeningeal spread. --Palliative care was consulted. 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   Not on any chemotherapy at this time. -Continuing home Decadron  and Keppra  --onc palliative care consulted, rec hospice   Acute urinary retention Patient with history of prior urinary retention which was initially thought to be due to cauda  equina, later he was able to void and discharged home without Foley. --Now back with urinary retention requiring Foley catheter which was placed in ED. Received 1 dose of ceftriaxone  which was not continued, recently treated for pansensitive E. coli UTI. -Repeat blood cultures obtained and pending -Continue with Flomax  --cont Foley -Patient need to follow-up with outpatient urology for voiding trial   Benign hypertension Blood pressure within goal. --cont lopressor      Chronic systolic CHF (congestive heart failure) (HCC)  2D echo on 02/23/2024 showed EF of 45%.  His on torsemide and spironolactone  were on hold from recent admission.  Patient has mild leg edema, but BNP is only 502.  No SOB.  --cont lopressor    COPD (chronic obstructive pulmonary disease) (HCC) No current concern of exacerbation, no wheezing. --cont bronchodilators   HLD (hyperlipidemia) - Continue Lipitor    Hypokalemia --monitor and supplement PRN   CKD stage 3a, ruled out CKD2 GFR above 60.     Thrombocytopenia Seems chronic, platelets of 57. Likely related to his advanced malignancy -Continue to monitor   History of DVT (deep vein thrombosis) - Needed close monitoring due to thrombocytopenia. --cont Eliquis     DVT prophylaxis: On:Eliquis  Code Status: DNR  Family Communication:  Level of care: Telemetry Dispo:   The patient is from: home Anticipated d/c is to: to be determined Anticipated d/c date is: when disposition found   Subjective and Interval History:  Pt asked to have his foley out.   Objective: Vitals:   06/16/24 0433 06/16/24 0530 06/16/24 0905 06/16/24 1600  BP: 116/89  124/78 (!) 129/90  Pulse: 100  99 (!) 103  Resp: 16  16 16  Temp: 97.7 F (36.5 C)  97.7 F (36.5 C) 98.2 F (36.8 C)  TempSrc: Oral  Oral Oral  SpO2: 96%  93% 91%  Weight:  75.9 kg    Height:  5' 7 (1.702 m)      Intake/Output Summary (Last 24 hours) at 06/16/2024 1834 Last data filed at 06/16/2024  0500 Gross per 24 hour  Intake 0 ml  Output 550 ml  Net -550 ml   Filed Weights   06/14/24 0432 06/15/24 0500 06/16/24 0530  Weight: 77 kg 77 kg 75.9 kg    Examination:   Constitutional: NAD, alert, not oriented HEENT: conjunctivae and lids normal, EOMI CV: No cyanosis.   RESP: normal respiratory effort Neuro: II - XII grossly intact.     Data Reviewed: I have personally reviewed labs and imaging studies  Time spent: 25 minutes  Ellouise Haber, MD [facility] If 7PM-7AM, please contact night-coverage 06/16/2024, 6:34 PM

## 2024-06-16 NOTE — Plan of Care (Signed)

## 2024-06-17 DIAGNOSIS — R531 Weakness: Secondary | ICD-10-CM | POA: Diagnosis not present

## 2024-06-17 NOTE — Progress Notes (Signed)
 PROGRESS NOTE    Roger Black  FMW:[id number] DOB: [birth year] DOA: 06/09/2024 PCP: [name], MD  118A/118A-AA  LOS: 7 days   Brief hospital course:   Assessment & Plan: Roger Black is a 68 y.o. 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   Patient recently had a long and complicated admission from 10/6 - 11/11 due to leptomeningeal carcinomatosis, bilateral lower extremity pain, NSCLC metastasized to brain.  Patient is s/p of craniotomy with brain tumor resection.  Patient is s/p of palliative radiation therapy, currently not on chemotherapy.  Patient was discharged to CIR on 10/23 and then home on 11/11.   Since home patient has progressive weakness and now unable to walk.  Had a fall on 11/12.   He has difficulty urinating, found to have acute urinary retention with 478 mL in the ED. Foley catheter was placed.  * Generalized weakness Falls. Patient with worsening generalized weakness and unable to walk or do ADLs now.  Recently discharged from CIR when he was able to walk some with the help of walker before discharge. --History of stage IV lung cancer with progression, recent craniotomy and palliative radiation to brain and cervical spine.  Also has leptomeningeal spread. --Palliative care was consulted. --wife and family decided to pursue hospice.  Wife can not care for pt at home. 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   Not on any chemotherapy at this time. -Continuing home Decadron  and Keppra  --onc palliative care consulted, rec hospice   Acute urinary retention Patient with history of prior urinary retention which was  initially thought to be due to cauda equina, later he was able to void and discharged home without Foley. --Now back with urinary retention requiring Foley catheter which was placed in ED. -Continue with Flomax  --cont Foley -Patient need to follow-up with outpatient urology for voiding trial   Benign hypertension Blood pressure within goal. --cont lopressor    Chronic systolic CHF (congestive heart failure) (HCC)  2D echo on 02/23/2024 showed EF of 45%.  His on torsemide and spironolactone  were on hold from recent admission.  Patient has mild leg edema, but BNP is only 502.  No SOB.  --cont lopressor    COPD (chronic obstructive pulmonary disease) (HCC) No current concern of exacerbation, no wheezing. --cont bronchodilators   HLD (hyperlipidemia) - Continue Lipitor    Hypokalemia --monitor and supplement PRN   CKD stage 3a, ruled out CKD2 GFR above 60.     Thrombocytopenia Seems chronic, platelets of 57. Likely related to his advanced malignancy -Continue to monitor   History of DVT (deep vein thrombosis) - Needed close monitoring due to thrombocytopenia. --cont Eliquis     DVT prophylaxis: On:Eliquis  Code Status: DNR  Family Communication:  Level of care: Telemetry Dispo:   The patient is from: home Anticipated d/c is to: to be determined Anticipated d/c date is: when disposition found   Subjective and Interval History:  No new event today.   Objective: Vitals:   06/17/24 0520 06/17/24 0751 06/17/24 1537 06/17/24 1932  BP: 108/76 (!) 129/92 126/88 130/82  Pulse: (!) 103 (!) 105 (!) 103 (!) 103  Resp: 14 19 20 20   Temp: 98.1 F (36.7 C) 97.8 F (36.6 C) 98.3 F (36.8  C) 98.9 F (37.2 C)  TempSrc: Oral Oral Oral Oral  SpO2: 92% 94% 93% 95%  Weight:      Height:        Intake/Output Summary (Last 24 hours) at 06/17/2024 2244 Last data filed at 06/17/2024 1300 Gross per 24 hour  Intake 240 ml  Output --  Net 240 ml   Filed Weights   06/15/24 0500  06/16/24 0530 06/17/24 0500  Weight: 77 kg 75.9 kg 74.7 kg    Examination:   Constitutional: NAD CV: No cyanosis.   RESP: normal respiratory effort, on RA   Data Reviewed: I have personally reviewed labs and imaging studies  Time spent: 25 minutes  Ellouise Haber, MD [facility] If 7PM-7AM, please contact night-coverage 06/17/2024, 10:44 PM

## 2024-06-17 NOTE — Progress Notes (Signed)
 Physical Therapy Treatment Patient Details Name: Roger Black MRN: [id number] DOB: [birth year] Today's Date: 06/17/2024   History of Present Illness Roger Black is a 68 y.o. 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 Pt presents with weakness and fall    PT Comments  Pt received in bed for co-tx with OT to safely progress functional mobility due to +2 Mod/MaxA all needs. Pt required repeated cues to stay on task and maintain focus on session. Resistant at times for mobility, c/o generalized body aches, unable to specify. Pt was able to sit EOB for several minutes maintaining sitting balance. Reliant on [place] to stand from raised bed with Mod/MaxA of 2. Pt unsafe in [place], attempting to lean to Right to sit back down on bed. Unable to safely transfer to bedside chair. Pt repositioned back in bed with all needs met.     If plan is discharge home, recommend the following: Two people to help with walking and/or transfers;Two people to help with bathing/dressing/bathroom;Assistance with cooking/housework;Direct supervision/assist for medications management;Assist for transportation;Direct supervision/assist for financial management;Help with stairs or ramp for entrance;Supervision due to cognitive status   Can travel by private vehicle     No  Equipment Recommendations  [place] lift    Recommendations for Other Services       Precautions / Restrictions Precautions Precautions: Fall Recall of Precautions/Restrictions: Impaired Precaution/Restrictions Comments: Previous craniotomy Restrictions Weight Bearing Restrictions Per Provider Order: No     Mobility  Bed Mobility Overal bed mobility: Needs Assistance Bed Mobility: Rolling Rolling: Max  assist   Supine to sit: Mod assist, Max assist, +2 for physical assistance, HOB elevated Sit to supine: Max assist, +2 for safety/equipment, +2 for physical assistance   General bed mobility comments: Increased time needed due to pt having difficulty staying on task, Heavy assist for bed mobility    Transfers Overall transfer level: Needs assistance Equipment used: Ambulation equipment used Transfers: Sit to/from Stand Sit to Stand: Max assist, +2 physical assistance, +2 safety/equipment           General transfer comment: MaxAx2 to stand from raised bed in [place]. Pt leaning to Right side trying to sit back down, unable to follow commands for safe transfer to bedside chair requiring return to bed Transfer via Lift Equipment: Stedy  Ambulation/Gait               General Gait Details: unable   Stairs             Wheelchair Mobility     Tilt Bed    Modified Rankin (Stroke Patients Only)       Balance Overall balance assessment: Needs assistance Sitting-balance support: Feet supported Sitting balance-Leahy Scale: Fair     Standing balance support: Bilateral upper extremity supported, During functional activity, Reliant on assistive device for balance Standing balance-Leahy Scale: Poor Standing balance comment:  (Utilized Stedy to stand from raised bed)                            Communication Communication Communication: Impaired Factors Affecting Communication: Difficulty expressing self  Cognition Arousal: Lethargic (Simultaneous filing. User may not have seen previous data.) Behavior During Therapy: Flat affect (Simultaneous filing. User may not have seen previous data.)   PT - Cognitive impairments: No family/caregiver present  to determine baseline, History of cognitive impairments                       PT - Cognition Comments: Brain mets, s/p craniotomy 10/25        Cueing    Exercises      General Comments General  comments (skin integrity, edema, etc.): Generalized c/o B LE discomfort      Pertinent Vitals/Pain Pain Assessment Pain Assessment: Faces Faces Pain Scale: Hurts little more Pain Location: Bilateral LE pain/generalized Pain Descriptors / Indicators: Grimacing Pain Intervention(s): Limited activity within patient's tolerance    Home Living                          Prior Function            PT Goals (current goals can now be found in the care plan section)      Frequency    Min 1X/week      PT Plan      Co-evaluation   Reason for Co-Treatment: Complexity of the patient's impairments (multi-system involvement) PT goals addressed during session: Mobility/safety with mobility;Balance        AM-PAC PT 6 Clicks Mobility   Outcome Measure  Help needed turning from your back to your side while in a flat bed without using bedrails?: Total Help needed moving from lying on your back to sitting on the side of a flat bed without using bedrails?: Total Help needed moving to and from a bed to a chair (including a wheelchair)?: Total Help needed standing up from a chair using your arms (e.g., wheelchair or bedside chair)?: Total Help needed to walk in hospital room?: Total Help needed climbing 3-5 steps with a railing? : Total 6 Click Score: 6    End of Session Equipment Utilized During Treatment: Gait belt Activity Tolerance: Patient limited by lethargy Patient left: in bed;with call bell/phone within reach;with bed alarm set Nurse Communication: Mobility status PT Visit Diagnosis: Other abnormalities of gait and mobility (R26.89);Muscle weakness (generalized) (M62.81);Pain Pain - part of body:  (generalized)     Time: 8974-8947 PT Time Calculation (min) (ACUTE ONLY): 27 min  Charges:    $Therapeutic Activity: 8-22 mins PT General Charges $$ ACUTE PT VISIT: 1 Visit                    Darice Bohr, PTA  Darice JAYSON Bohr 06/17/2024, 12:54 PM

## 2024-06-17 NOTE — Progress Notes (Signed)
Occupational Therapy Treatment Patient Details Name: Zeven Kocak MRN: [id number] DOB: 10-02-1955 Today's Date: 06/17/2024   History of present illness Quinto Tippy is a 68 y.o. 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 Pt presents with weakness and fall   OT comments  Mr. Vasco was seen for OT/PT co-treatment on this date. Upon arrival to room pt supine in bed, agreeable to Tx session. OT facilitated ADL management with education and assistance as described below. Pt remains pleasantly confused t/o session. Has difficulty following VCs consistently. Requires +2 MAX A for STS t/f, and is unable to safely transfer to recliner with STEDY this date. See ADL section for additional details regarding occupational performance. Pt continues to be functionally limited by decreased cognition, generalized weakness, poor activity tolerance, and pain. Pt is progressing toward OT goals and continues to benefit from skilled OT services to maximize return to PLOF and minimize risk of future falls, injury, and readmission. Will continue to follow POC as written. Discharge recommendation remains appropriate.        If plan is discharge home, recommend the following:  Two people to help with walking and/or transfers;Two people to help with bathing/dressing/bathroom   Equipment Recommendations  Hospital bed    Recommendations for Other Services      Precautions / Restrictions Precautions Precautions: Fall Recall of Precautions/Restrictions: Impaired Restrictions Weight Bearing Restrictions Per Provider Order: No       Mobility Bed Mobility Overal bed mobility: Needs Assistance                  Transfers Overall transfer level: Needs assistance    Transfers: Sit to/from Stand Sit to Stand: Via lift equipment, Max assist, +2 physical assistance           General transfer comment: MAX A +2 STS Transfer via Lift Equipment: Stedy   Balance Overall balance assessment: Needs assistance Sitting-balance support: Feet supported Sitting balance-Leahy Scale: Fair     Standing balance support: Bilateral upper extremity supported, During functional activity, Reliant on assistive device for balance Standing balance-Leahy Scale: Zero Standing balance comment: Unable to come to stand this date.                           ADL either performed or assessed with clinical judgement   ADL Overall ADL's : Needs assistance/impaired     Grooming: Minimal assistance;Bed level                               Functional mobility during ADLs: Total assistance;+2 for physical assistance;Cueing for safety;Cueing for sequencing General ADL Comments: Heavy MAX-TOTAL A+2 physical assist for bed mobility and attempted t/f with STEDY lift. Pt c/o increased LLE pain with mobility attempts, very resistant to functional activity this date. Unsafe to complete transfer to recliner. TOTAL A +2 to return to supine. MIN  A For bed level UB grooming.    Extremity/Trunk Assessment              Diplomatic Services Operational Officer Communication Communication: Impaired Factors Affecting Communication: Difficulty expressing self   Cognition     Cognition: No family/caregiver present to determine baseline, Cognition  impaired   Orientation impairments: Place, Time, Situation Awareness: Intellectual awareness impaired, Online awareness impaired Memory impairment (select all impairments): Short-term memory, Working memory, Non-declarative long-term memory, Geneticist, Molecular long-term memory Attention impairment (select first level of impairment): Sustained attention Executive functioning impairment (select all  impairments): Initiation, Organization, Sequencing, Reasoning, Problem solving                   Following commands: Impaired Following commands impaired: Follows one step commands inconsistently      Cueing   Cueing Techniques: Verbal cues, Gestural cues, Tactile cues  Exercises Other Exercises Other Exercises: OT facilitated ADL management with education and assist as described above.    Shoulder Instructions       General Comments      Pertinent Vitals/ Pain       Pain Assessment Pain Assessment: Faces Faces Pain Scale: Hurts even more Pain Location: LLE>RLE, but pt c/o of BLE hurting during session. Pain Descriptors / Indicators: Grimacing, Guarding Pain Intervention(s): Limited activity within patient's tolerance, Monitored during session, Repositioned  Home Living                                          Prior Functioning/Environment              Frequency  Min 2X/week        Progress Toward Goals  OT Goals(current goals can now be found in the care plan section)  Progress towards OT goals: OT to reassess next treatment (Limited progress appreciated this date.)  Acute Rehab OT Goals Patient Stated Goal: to go home OT Goal Formulation: Patient unable to participate in goal setting Time For Goal Achievement: 07/18/2024 Potential to Achieve Goals: Fair  Plan      Co-evaluation    PT/OT/SLP Co-Evaluation/Treatment: Yes Reason for Co-Treatment: Complexity of the patient's impairments (multi-system involvement) PT goals addressed during session: Mobility/safety with mobility;Balance        AM-PAC OT 6 Clicks Daily Activity     Outcome Measure   Help from another person eating meals?: A Little Help from another person taking care of personal grooming?: A Lot Help from another person toileting, which includes using toliet, bedpan, or urinal?: A Lot Help from another person bathing (including washing, rinsing, drying)?: A  Lot Help from another person to put on and taking off regular upper body clothing?: A Lot Help from another person to put on and taking off regular lower body clothing?: A Lot 6 Click Score: 13    End of Session Equipment Utilized During Treatment: Other (comment) (STEDY)  OT Visit Diagnosis: Other abnormalities of gait and mobility (R26.89);Muscle weakness (generalized) (M62.81)   Activity Tolerance Patient limited by lethargy (Limited by cognition.)   Patient Left in bed;with call bell/phone within reach;with bed alarm set   Nurse Communication Mobility status        Time: 8968-8944 OT Time Calculation (min): 24 min  Charges: OT General Charges $OT Visit: 1 Visit OT Treatments $Self Care/Home Management : 8-22 mins  Jhonny Pelton, M.S., OTR/L 06/17/24, 12:49 PM

## 2024-06-17 NOTE — Plan of Care (Signed)

## 2024-06-17 NOTE — TOC Progression Note (Signed)
 Transition of Care [facility]) - Progression Note    Patient Details  Name: Roger Black MRN: [id number] Date of Birth: 28-May-1956  Transition [facility] CM/SW Contact  Dalia GORMAN Fuse, RN Phone Number: 06/17/2024, 3:52 PM  Clinical Narrative:     Therapy continues to rec SNF. TOC left message for Admissions at [facility] [place], and [facility], awaiting f/u. TOC extended bed search to [place]  TOC will continue to follow                    Expected Discharge Plan and Services                                               Social Drivers of Health (SDOH) Interventions SDOH Screenings   Food Insecurity: Patient Declined (06/10/2024)  Housing: Patient Declined (06/10/2024)  Recent Concern: Housing - High Risk (05/07/2024)  Transportation Needs: No Transportation Needs (06/10/2024)  Utilities: Patient Declined (06/10/2024)  Financial Resource Strain: Low Risk  (03/07/2024)   Received from [facility]  Recent Concern: Financial Resource Strain - High Risk (01/29/2024)   Received from [facility]  Physical Activity: Inactive (08/07/2023)   Received from [facility]  Social Connections: Patient Declined (06/10/2024)  Stress: Stress Concern Present (08/07/2023)   Received from [facility]  Tobacco Use: Medium Risk (06/09/2024)  Health Literacy: Adequate Health Literacy (08/07/2023)   Received from [facility]    Readmission Risk Interventions     No data to display

## 2024-06-18 DIAGNOSIS — R531 Weakness: Secondary | ICD-10-CM | POA: Diagnosis not present

## 2024-06-18 MED ORDER — GLYCOPYRROLATE 0.2 MG/ML IJ SOLN: 0.2000 mg | INTRAMUSCULAR | Status: DC | PRN

## 2024-06-18 MED ORDER — GLYCOPYRROLATE 1 MG PO TABS: 1.0000 mg | ORAL_TABLET | ORAL | Status: DC | PRN

## 2024-06-18 MED ORDER — GLYCOPYRROLATE 0.2 MG/ML IJ SOLN
0.2000 mg | INTRAMUSCULAR | Status: DC | PRN
  Administered 2024-06-24 (×2): 0.2 mg via INTRAVENOUS
  Filled 2024-06-18 (×2): qty 1

## 2024-06-18 MED ORDER — MORPHINE SULFATE (PF) 2 MG/ML IV SOLN
2.0000 mg | INTRAVENOUS | Status: DC | PRN
  Administered 2024-06-18 – 2024-06-24 (×9): 2 mg via INTRAVENOUS
  Filled 2024-06-18 (×11): qty 1

## 2024-06-18 MED ORDER — POLYVINYL ALCOHOL 1.4 % OP SOLN: 1.0000 [drp] | Freq: Four times a day (QID) | OPHTHALMIC | Status: DC | PRN

## 2024-06-18 NOTE — Progress Notes (Signed)
 PROGRESS NOTE    Roger Black  FMW:[id number] DOB: Jul 08, 1956 DOA: 06/09/2024 PCP: [name], MD  Chief Complaint  Patient presents with   [facility] Course:  Roger Black is a 68 year old male with hypertension, hyperlipidemia, COPD, systolic CHF, depression, stage IIIa CKD, DVT on Eliquis , neuropathy, thrombocytopenia, anemia, leptomeningeal carcinoma, non-small cell lung cancer with brain mets who presents with weakness and fall. Patient had long and complicated admission 10/6 through 11/11 due to leptomeningeal carcinomatosis and bilateral lower extremity pain.  He is status post craniotomy with brain tumor resection.  He is also status post palliative radiation, not currently on chemotherapy.  He was discharged to CIR on 10/23 and then home on 11/11. Since arriving home patient has had progressive weakness and is now unable to walk.  He suffered a fall on 11/12.  He was also found to be retaining urine and Foley catheter was placed. Palliative care and prior attending have had ongoing goals of care conversations with the patient's wife and family and they have decided to pursue outpatient hospice.  The patient's wife is unable to care for him at home and [facility] is working on arranging outpatient placement.   Subjective: Patient continues to be tachycardic and he is complaining of pain.  He has been intermittently refusing vitals and therapy.  On my evaluation he reports no significant pain, but then his speech becomes nonsensical. I have attempted to reach his wife on 5 separate occasions today and left voicemails with no return.  I was able to reach his son, Daivd, who reports that all medical decision should be directed to wife, Roger Black.  He also shares that he believes Roger Black is developing dementia.  He reports that Roger Black is very difficult for him to reach as well and she does not consistently answer the phone and has questionable  service.   Objective: Vitals:   06/17/24 1932 06/18/24 0500 06/18/24 0515 06/18/24 1142  BP: 130/82  116/80 93/69  Pulse: (!) 103  (!) 122 (!) 110  Resp: 20  20 20   Temp:   99.4 F (37.4 C) 98.7 F (37.1 C)  TempSrc: Oral  Oral Oral  SpO2: 95%  97% 94%  Weight:  70.9 kg    Height:        Intake/Output Summary (Last 24 hours) at 06/18/2024 1420 Last data filed at 06/18/2024 0600 Gross per 24 hour  Intake --  Output 600 ml  Net -600 ml   Filed Weights   06/16/24 0530 06/17/24 0500 06/18/24 0500  Weight: 75.9 kg 74.7 kg 70.9 kg    Examination: General exam: Appears calm and comfortable, NAD  Respiratory system: No work of breathing, symmetric chest wall expansion Cardiovascular system: S1 & S2 heard, RRR.  Gastrointestinal system: Abdomen is nondistended, soft and nontender.  Neuro: Alert, disoriented, does not consistently follow questions.  Speech is sometimes tangential and nonsensical   Assessment & Plan:  Principal Problem:   Generalized weakness Active Problems:   Fall at home, initial encounter   Acute urinary retention   Benign hypertension   Chronic systolic CHF (congestive heart failure) (HCC)   COPD (chronic obstructive pulmonary disease) (HCC)   HLD (hyperlipidemia)   Hypokalemia   Chronic kidney disease, stage 3a (HCC)   Thrombocytopenia   Malignant neoplasm of upper lobe of left lung (HCC)   History of DVT (deep vein thrombosis)   Malignant neoplasm metastatic to brain [facility])   Palliative care encounter  Generalized weakness Recurrent falls - Unable to perform ADLs at home.  Recently discharged from CIR, subsequently suffered a fall when he got home - Falls and generalized weakness currently complicated by metastatic spread to brain. 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  Status post palliative  radiation therapy - Not currently on chemo - Continue Decadron  and Keppra  - Patient pending outpatient hospice care  Acute urinary retention - Continue Foley catheter for comfort - Continue Flomax   Hypertension - Continue current meds.  Titrate daily as needed  Chronic systolic CHF - August 2025 EF 54% - Monitor volume status closely.  As needed furosemide.  COPD not currently in exacerbation - Continue home dose bronchodilators  Hyperlipidemia - Continue statin  Hypokalemia - Monitor and supplement as needed  CKD stage IIIa, ruled out CKD stage II more appropriate - GFR remains above 60  History of DVT - Continue Eliquis  for now  Thrombocytopenia - 57 on 11/18.  Will repeat labs today if patient allows  Body mass index is 24.48 kg/m. - Outpatient follow up for lifestyle modification and risk factor management  Poor prognosis - Patient is currently pending outpatient hospice placement.  While awaiting outpatient hospice arrangements he is progressively declining here.  He is also refusing vitals and lab draws.  He is persistently tachycardic and requiring higher dose of pain medications for adequate control.  He should be comfort measures only while in the hospital.  I was able to reach his son today who confirmed this desire but son is not the POA and has said all decisions need to be made by Roger Black, the patient's wife.  Unfortunately Roger Black has been very difficult to reach.  I have called her over 5 times today with no success.  I have left voicemails.  I have informed his bedside RN to alert me if in need of calls or come to the bedside.  DVT prophylaxis: Eliquis    Code Status: Limited: Do not attempt resuscitation (DNR) -DNR-LIMITED -Do Not Intubate/DNI  Disposition:  Pending outpatient arrangements  Consultants:  Treatment Team:  Consulting Physician: Parris Manna, MD  Procedures:    Antimicrobials:  Anti-infectives (From admission, onward)    Start      Dose/Rate Route Frequency Ordered Stop   06/09/24 2130  cefTRIAXone  (ROCEPHIN ) 2 g in sodium chloride  0.9 % 100 mL IVPB        2 g 200 mL/hr over 30 Minutes Intravenous  Once 06/09/24 2120 06/09/24 2300       Data Reviewed: I have personally reviewed following labs and imaging studies CBC: No results for input(s): WBC, NEUTROABS, HGB, HCT, MCV, PLT in the last 168 hours. Basic Metabolic Panel: No results for input(s): NA, K, CL, CO2, GLUCOSE, BUN, CREATININE, CALCIUM , MG, PHOS in the last 168 hours. GFR: Estimated Creatinine Clearance: 82.6 mL/min (by C-G formula based on SCr of 0.68 mg/dL). Liver Function Tests: No results for input(s): AST, ALT, ALKPHOS, BILITOT, PROT, ALBUMIN in the last 168 hours. CBG: Recent Labs  Lab 06/13/24 0838 06/13/24 1222 06/13/24 1605 06/13/24 2009  GLUCAP 111* 136* 166* 149*    Recent Results (from the past 240 hours)  Urine Culture     Status: Abnormal   Collection Time: 06/09/24  8:00 PM   Specimen: Urine, Random  Result Value Ref Range Status   Specimen Description   Final    URINE, RANDOM Performed  at [facility], [address], [place], [name] [address]    Special Requests   Final    NONE Performed at [facility], [address] [place], [name] [address]    Culture MULTIPLE SPECIES PRESENT, SUGGEST RECOLLECTION (A)  Final   Report Status 06/10/2024 FINAL  Final     Radiology Studies: No results found.  Scheduled Meds:  apixaban   5 mg Oral BID   atorvastatin   80 mg Oral Daily   Chlorhexidine  Gluconate Cloth  6 each Topical Daily   vitamin D3  1,000 Units Oral Daily   collagenase    Topical Daily   dexamethasone   4 mg Oral Daily   DULoxetine   60 mg Oral Daily   fluticasone  furoate-vilanterol  1 puff Inhalation Daily   levETIRAcetam   500 mg Oral BID   metoprolol  tartrate  12.5 mg Oral BID   montelukast   10 mg Oral Daily   pantoprazole   40 mg Oral Daily    polyethylene glycol  17 g Oral BID   pregabalin   150 mg Oral TID   senna-docusate  1 tablet Oral QHS   tamsulosin   0.4 mg Oral QPC supper   tiZANidine   2 mg Oral TID   Continuous Infusions:   LOS: 8 days  MDM: Patient is high risk for one or more organ failure.  They necessitate ongoing hospitalization for continued IV therapies and subsequent lab monitoring. Total time spent interpreting labs and vitals, reviewing the medical record, coordinating care amongst consultants and care team members, directly assessing and discussing care with the patient and/or family: 55 min  Jehu Mccauslin, DO [facility]  To contact the attending physician between 7A-7P please use Epic Chat. To contact the covering physician during after hours 7P-7A, please review Amion.  06/18/2024, 2:20 PM   *This document has been created with the assistance of dictation software. Please excuse typographical errors. *

## 2024-06-18 NOTE — Plan of Care (Signed)

## 2024-06-18 NOTE — Progress Notes (Signed)
 Interim progress note:  Patient's wife, [name], presented to the hospital.  I was able to discuss with her the patient's current tachycardia, pain, and increasing needs.  We discussed continuing all aggressive treatment while in the hospital awaiting transfer to hospice versus comfort measures only.  His wife was very clear that she would like to proceed with comfort measures only moving forward.  She reports her only goal is to ensure he is out of pain.   I have now transition the patient to comfort measures only.  We will proceed with qshift only vitals, turn to follow alarms, discontinue all lab draws, discontinue all medications that do not immediately provide the patient comfort.  Pain, anxiety, and other symptom medications remain available for the patient.  We will titrate these daily as needed

## 2024-06-18 NOTE — TOC Progression Note (Addendum)
 Transition of Care [facility]) - Progression Note    Patient Details  Name: Roger Black MRN: [id number] Date of Birth: Dec 10, 1955  Transition of Care [facility]) CM/SW Contact  Dalia GORMAN Fuse, RN Phone Number: 06/18/2024, 10:11 AM  Clinical Narrative:     No bed offers, but there are facilities considering the patient. TOC spoke with Tammy at [facility] and Tammy with [facility] and asked them to review. TOC is awaiting f/u.  TOC received callback from [place], [facility] can accept the patient.   TOC spoke with the patient in the room and he would like to go to the facility. He asked TOC how long he would be there and TOC advised on ave 2-3 weeks.  TOC lvmm with the patient's spouse on home and mobile phone requesting callback.  TOC placed call to [facility] and started insurance auth. TOC provided name of TOC covering tomorrow.    10:57 PM TOC lvmm with the patient's spouse on home and mobile phone requesting callback.               Expected Discharge Plan and Services                                               Social Drivers of Health (SDOH) Interventions SDOH Screenings   Food Insecurity: Patient Declined (06/10/2024)  Housing: Patient Declined (06/10/2024)  Recent Concern: Housing - High Risk (05/07/2024)  Transportation Needs: No Transportation Needs (06/10/2024)  Utilities: Patient Declined (06/10/2024)  Financial Resource Strain: Low Risk  (03/07/2024)   Received from [facility]  Recent Concern: Financial Resource Strain - High Risk (01/29/2024)   Received from [facility]  Physical Activity: Inactive (08/07/2023)   Received from [facility]  Social Connections: Patient Declined (06/10/2024)  Stress: Stress Concern Present (08/07/2023)   Received from [facility]  Tobacco Use: Medium Risk (06/09/2024)  Health Literacy: Adequate Health Literacy (08/07/2023)    Received from [facility]    Readmission Risk Interventions     No data to display

## 2024-06-18 NOTE — Plan of Care (Signed)
 Performed general assessment, obtained BS per hospital/unit protocols.  Patient noted to be a yellow MEWS.  Remains asymptomatic.  No additional signs of acute distress assessed.  Patient has verbally declined to perform highest level of mobility.  Has received prn medications for pain/anxiety, and sleep.  Patient has rested throughout the night.  No additional medical interventions required at this time.  Patient to continue to be monitored by hospital staff.

## 2024-06-19 DIAGNOSIS — Z86718 Personal history of other venous thrombosis and embolism: Secondary | ICD-10-CM | POA: Diagnosis not present

## 2024-06-19 DIAGNOSIS — Z515 Encounter for palliative care: Secondary | ICD-10-CM | POA: Diagnosis not present

## 2024-06-19 DIAGNOSIS — R531 Weakness: Secondary | ICD-10-CM | POA: Diagnosis not present

## 2024-06-19 DIAGNOSIS — L8990 Pressure ulcer of unspecified site, unspecified stage: Secondary | ICD-10-CM | POA: Insufficient documentation

## 2024-06-19 DIAGNOSIS — C7931 Secondary malignant neoplasm of brain: Secondary | ICD-10-CM | POA: Diagnosis not present

## 2024-06-19 MED ORDER — HALOPERIDOL LACTATE 5 MG/ML IJ SOLN: 2.5000 mg | INTRAMUSCULAR | Status: DC | PRN

## 2024-06-19 MED ORDER — HALOPERIDOL LACTATE 5 MG/ML IJ SOLN
2.5000 mg | INTRAMUSCULAR | Status: DC | PRN
  Administered 2024-06-19: 5 mg via INTRAVENOUS
  Administered 2024-06-22: 2.5 mg via INTRAVENOUS
  Filled 2024-06-19: qty 1

## 2024-06-19 MED ORDER — LORAZEPAM 2 MG/ML IJ SOLN
1.0000 mg | INTRAMUSCULAR | Status: DC | PRN
  Administered 2024-06-19: 1 mg via INTRAVENOUS
  Filled 2024-06-19: qty 1

## 2024-06-19 MED ORDER — HALOPERIDOL LACTATE 5 MG/ML IJ SOLN
2.0000 mg | Freq: Four times a day (QID) | INTRAMUSCULAR | Status: DC | PRN
  Filled 2024-06-19: qty 1

## 2024-06-19 MED ORDER — SODIUM CHLORIDE 0.9 % IV SOLN: INTRAVENOUS | Status: AC

## 2024-06-19 MED ORDER — LORAZEPAM 2 MG/ML IJ SOLN
2.0000 mg | INTRAMUSCULAR | Status: DC | PRN
  Administered 2024-06-23: 4 mg via INTRAVENOUS
  Filled 2024-06-19 (×2): qty 2

## 2024-06-19 NOTE — Plan of Care (Signed)

## 2024-06-19 NOTE — Progress Notes (Signed)
 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 Patient had long and complicated admission 10/6 through 11/11 due to leptomeningeal carcinomatosis and bilateral lower extremity pain.  He is status post craniotomy with brain tumor resection.  He is also status post palliative radiation, not currently on chemotherapy.  He was discharged to CIR on 10/23 and then home on 11/11. Since arriving home patient has had progressive weakness and is now unable to walk.  He suffered a fall on 11/12.  He was also found to be retaining urine and Foley catheter was placed. Palliative care and prior attending have had ongoing goals of care conversations with the patient's wife and family and they have decided to pursue outpatient hospice.  The patient's wife is unable to care for him at home and [facility] is working on arranging outpatient placement.  On 11/27 he was made comfort measures only.   Subjective: Worsening agitation this morning requiring Ativan  and Haldol .  Reportedly patient was taking swings ay nurses. By the time of my evaluation he is resting easily.  Objective: Vitals:   06/18/24 0500 06/18/24 0515 06/18/24 1142 06/18/24 2026  BP:  116/80 93/69 (!) 128/99  Pulse:  (!) 122 (!) 110 (!) 101  Resp:  20 20   Temp:  99.4 F (37.4 C) 98.7 F (37.1 C) 97.6 F (36.4 C)  TempSrc:  Oral Oral Axillary  SpO2:  97% 94% 93%  Weight: 70.9 kg     Height:        Intake/Output Summary (Last 24 hours) at 06/19/2024 9297 Last data filed at 06/19/2024 9447 Gross per 24 hour  Intake --   Output 650 ml  Net -650 ml   Filed Weights   06/16/24 0530 06/17/24 0500 06/18/24 0500  Weight: 75.9 kg 74.7 kg 70.9 kg    Examination: General exam: Appears calm and comfortable, NAD  Respiratory system: No work of breathing, symmetric chest wall expansion Cardiovascular system: S1 & S2 heard, RRR.  Gastrointestinal system: Abdomen is nondistended, soft and nontender.  Neuro: Sleeping easily  Assessment & Plan:  Principal Problem:   Generalized weakness Active Problems:   Fall at home, initial encounter   Acute urinary retention   Benign hypertension   Chronic systolic CHF (congestive heart failure) (HCC)   COPD (chronic obstructive pulmonary disease) (HCC)   HLD (hyperlipidemia)   Hypokalemia   Chronic kidney disease, stage 3a (HCC)   Thrombocytopenia   Malignant neoplasm of upper lobe of left lung (HCC)   History of DVT (deep vein thrombosis)   Malignant neoplasm metastatic to brain [facility])   Palliative care encounter    Comfort measures only - qshift only vitals, turn to follow alarms, discontinue all lab draws, discontinue all medications that do not immediately provide the patient comfort - Pain, and anxiety medications remain available as needed.  Titrate needs daily  Generalized weakness Recurrent falls - Unable to perform ADLs at home.  Recently discharged from CIR, subsequently suffered a fall when he got home - Falls and generalized weakness currently complicated by metastatic spread to brain. 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  Status post palliative radiation therapy - Not currently on chemo - Continue Decadron  and Keppra  - Patient pending outpatient hospice care  Acute urinary retention - Continue Foley catheter for comfort  Hypertension - Continue current medications   Chronic systolic CHF - August 2025  EF 45% - Consider as needed furosemide for symptomatic support  COPD not currently in exacerbation - Continue home dose bronchodilators  Hyperlipidemia -Discontinue statin in light of comfort measures  Hypokalemia -Has been supplemented.  Have discontinued lab draws  CKD stage IIIa, ruled out CKD stage II more appropriate - GFR remains above 60  History of DVT - Continue Eliquis  for now  Thrombocytopenia - Stop lab trending  Body mass index is 24.48 kg/m. - Outpatient follow up for lifestyle modification and risk factor management   DVT prophylaxis: Eliquis    Code Status: Do not attempt resuscitation (DNR) - Comfort care Disposition:  Pending outpatient arrangements, long-term care with hospice versus inpatient hospice.  Consultants:  Treatment Team:  Consulting Physician: Parris Manna, MD  Procedures:    Antimicrobials:  Anti-infectives (From admission, onward)    Start     Dose/Rate Route Frequency Ordered Stop   06/09/24 2130  cefTRIAXone  (ROCEPHIN ) 2 g in sodium chloride  0.9 % 100 mL IVPB        2 g 200 mL/hr over 30 Minutes Intravenous  Once 06/09/24 2120 06/09/24 2300       Data Reviewed: I have personally reviewed following labs and imaging studies CBC: No results for input(s): WBC, NEUTROABS, HGB, HCT, MCV, PLT in the last 168 hours. Basic Metabolic Panel: No results for input(s): NA, K, CL, CO2, GLUCOSE, BUN, CREATININE, CALCIUM , MG, PHOS in the last 168 hours. GFR: Estimated Creatinine Clearance: 82.6 mL/min (by C-G formula based on SCr of 0.68 mg/dL). Liver Function Tests: No results for input(s): AST, ALT, ALKPHOS, BILITOT, PROT, ALBUMIN in the last 168 hours. CBG: Recent Labs  Lab 06/13/24 0838 06/13/24 1222 06/13/24 1605 06/13/24 2009  GLUCAP 111* 136* 166* 149*    Recent Results (from the past 240 hours)  Urine Culture     Status: Abnormal   Collection Time: 06/09/24  8:00 PM    Specimen: Urine, Random  Result Value Ref Range Status   Specimen Description   Final    URINE, RANDOM Performed at [facility], [address], [place], [name] [address]    Special Requests   Final    NONE Performed at [facility], [address] [place], [name] [address]    Culture MULTIPLE SPECIES PRESENT, SUGGEST RECOLLECTION (A)  Final   Report Status 06/10/2024 FINAL  Final     Radiology Studies: No results found.  Scheduled Meds:  apixaban   5 mg Oral BID   Chlorhexidine  Gluconate Cloth  6 each Topical Daily   collagenase    Topical Daily   dexamethasone   4 mg Oral Daily   DULoxetine   60 mg Oral Daily   fluticasone  furoate-vilanterol  1 puff Inhalation Daily   levETIRAcetam   500 mg Oral BID   metoprolol  tartrate  12.5 mg Oral BID   polyethylene glycol  17 g Oral BID   pregabalin   150 mg Oral TID   senna-docusate  1 tablet Oral QHS   tiZANidine   2 mg Oral TID   Continuous Infusions:   LOS: 9 days  MDM: Patient is high risk for one or more organ  failure.  They necessitate ongoing hospitalization for continued IV therapies and subsequent lab monitoring. Total time spent interpreting labs and vitals, reviewing the medical record, coordinating care amongst consultants and care team members, directly assessing and discussing care with the patient and/or family: 55 min  Shyanne Mcclary, DO [facility]  To contact the attending physician between 7A-7P please use Epic Chat. To contact the covering physician during after hours 7P-7A, please review Amion.  06/19/2024, 7:02 AM   *This document has been created with the assistance of dictation software. Please excuse typographical errors. *

## 2024-06-19 NOTE — Progress Notes (Signed)
 Patient became agitated when this writer attempted to change his IV dressing. Patient started swinging and attempting to pinch staff.

## 2024-06-19 NOTE — TOC Progression Note (Signed)
 Transition of Care [facility]) - Progression Note    Patient Details  Name: Roger Black MRN: [id number] Date of Birth: 01-23-1956  Transition of Care [facility]) CM/SW Contact  Meenakshi Sazama L Soma [place], [name] Phone Number: 06/19/2024, 9:13 AM  Clinical Narrative:     Call received from [facility]. CSW spoke to [facility]. Tammy advising that patient claim may be denied due to his code status. She stated that she has already worked up the authorization for review however, it is her professional opinion that it would be denied based on the documentation in EPIC.    CSW followed up with the provider. Patient status pending. Continued observation needed to determine next steps regarding IPU.   Tammy updated. She advised that she will move forward with processing the authorization. She stated that she will advise the [facility] provider of the information received.                     Expected Discharge Plan and Services                                               Social Drivers of Health (SDOH) Interventions SDOH Screenings   Food Insecurity: Patient Declined (06/10/2024)  Housing: Patient Declined (06/10/2024)  Recent Concern: Housing - High Risk (05/07/2024)  Transportation Needs: No Transportation Needs (06/10/2024)  Utilities: Patient Declined (06/10/2024)  Financial Resource Strain: Low Risk  (03/07/2024)   Received from [facility]  Recent Concern: Financial Resource Strain - High Risk (01/29/2024)   Received from [facility]  Physical Activity: Inactive (08/07/2023)   Received from [facility]  Social Connections: Patient Declined (06/10/2024)  Stress: Stress Concern Present (08/07/2023)   Received from [facility]  Tobacco Use: Medium Risk (06/09/2024)  Health Literacy: Adequate Health Literacy (08/07/2023)   Received from [facility]    Readmission Risk Interventions     No data  to display

## 2024-06-20 DIAGNOSIS — R531 Weakness: Secondary | ICD-10-CM | POA: Diagnosis not present

## 2024-06-20 DIAGNOSIS — C7931 Secondary malignant neoplasm of brain: Secondary | ICD-10-CM | POA: Diagnosis not present

## 2024-06-20 DIAGNOSIS — Z515 Encounter for palliative care: Secondary | ICD-10-CM | POA: Diagnosis not present

## 2024-06-20 DIAGNOSIS — Z86718 Personal history of other venous thrombosis and embolism: Secondary | ICD-10-CM | POA: Diagnosis not present

## 2024-06-20 NOTE — Plan of Care (Signed)

## 2024-06-20 NOTE — Progress Notes (Signed)
 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 Patient had long and complicated admission 10/6 through 11/11 due to leptomeningeal carcinomatosis and bilateral lower extremity pain.  He is status post craniotomy with brain tumor resection.  He is also status post palliative radiation, not currently on chemotherapy.  He was discharged to CIR on 10/23 and then home on 11/11. Since arriving home patient has had progressive weakness and is now unable to walk.  He suffered a fall on 11/12.  He was also found to be retaining urine and Foley catheter was placed. Palliative care and prior attending have had ongoing goals of care conversations with the patient's wife and family and they have decided to pursue outpatient hospice.  The patient's wife is unable to care for him at home and [facility] is working on arranging outpatient placement.  On 11/27 he was made comfort measures only.   Subjective: Persistent tachycardia.  Patient is very drowsy this morning.  He opens his eyes but speech is nonsensical.  He is redirectable  Objective: Vitals:   06/18/24 1142 06/18/24 2026 06/19/24 0803 06/20/24 0817  BP: 93/69 (!) 128/99 (!) 118/90 (!) 147/98  Pulse: (!) 110 (!) 101 (!) 112 (!) 121  Resp: 20  18 18   Temp: 98.7 F (37.1 C) 97.6 F (36.4 C) (!) 97.4 F (36.3 C) 97.7 F (36.5 C)  TempSrc: Oral Axillary  Oral  SpO2: 94% 93% 93% 91%  Weight:      Height:        Intake/Output Summary (Last 24 hours) at 06/20/2024 1254 Last data filed at 06/20/2024 1007 Gross per 24 hour  Intake  285.03 ml  Output 450 ml  Net -164.97 ml   Filed Weights   06/16/24 0530 06/17/24 0500 06/18/24 0500  Weight: 75.9 kg 74.7 kg 70.9 kg    Examination: General exam: Appears calm and comfortable, NAD  Respiratory system: No work of breathing, symmetric chest wall expansion Cardiovascular system: S1 & S2 heard, RRR.  Gastrointestinal system: Abdomen is nondistended, soft and nontender.  Neuro: Sleeping easily, little agitated upon waking but easily redirectable  Assessment & Plan:  Principal Problem:   Generalized weakness Active Problems:   Fall at home, initial encounter   Acute urinary retention   Benign hypertension   Chronic systolic CHF (congestive heart failure) (HCC)   COPD (chronic obstructive pulmonary disease) (HCC)   HLD (hyperlipidemia)   Hypokalemia   Chronic kidney disease, stage 3a (HCC)   Thrombocytopenia   Malignant neoplasm of upper lobe of left lung (HCC)   History of DVT (deep vein thrombosis)   Malignant neoplasm metastatic to brain [facility])   Palliative care encounter   Pressure injury of skin    Comfort measures only - qshift only vitals, turn to follow alarms, discontinue all lab draws, discontinue all medications that do not immediately provide the patient comfort - Pain, and anxiety medications remain available as needed.  Titrate needs daily - He does appear to have ongoing IV medication needs for pain and agitation.  Also has persistent tachycardia.  Given high symptom burden patient may benefit from inpatient hospice  placement.  Have reached out to [facility] and hospice liaison today.  Generalized weakness Recurrent falls - Unable to perform ADLs at home.  Recently discharged from CIR, subsequently suffered a fall when he got home - Falls and generalized weakness currently complicated by metastatic spread to brain. 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  Status post palliative radiation therapy - Not currently on chemo - Continue Decadron  and Keppra  - Patient pending outpatient hospice care  Acute urinary retention - Continue Foley catheter for comfort  Hypertension - Continue current medications   Chronic systolic CHF - August 2025 EF 54% - Consider as needed furosemide for symptomatic support  COPD not currently in exacerbation - Continue home dose bronchodilators  Hyperlipidemia -Discontinue statin in light of comfort measures  Hypokalemia -Has been supplemented.  Have discontinued lab draws  CKD stage IIIa, ruled out CKD stage II more appropriate - GFR remains above 60  History of DVT - Continue Eliquis  for now  Thrombocytopenia - Stop lab trending  Body mass index is 24.48 kg/m. - Outpatient follow up for lifestyle modification and risk factor management   DVT prophylaxis: Eliquis    Code Status: Do not attempt resuscitation (DNR) - Comfort care Disposition:  Pending outpatient arrangements, long-term care with hospice versus inpatient hospice.  Consultants:  Treatment Team:  Consulting Physician: Parris Manna, MD  Procedures:    Antimicrobials:  Anti-infectives (From admission, onward)    Start     Dose/Rate Route Frequency Ordered Stop   06/09/24 2130  cefTRIAXone  (ROCEPHIN ) 2 g in sodium chloride  0.9 % 100 mL IVPB        2 g 200 mL/hr over 30 Minutes Intravenous  Once 06/09/24 2120 06/09/24 2300       Data Reviewed: I have personally reviewed following labs and imaging studies CBC: No results for input(s): WBC, NEUTROABS, HGB, HCT, MCV, PLT in the last 168 hours. Basic Metabolic Panel: No results for input(s): NA, K, CL, CO2, GLUCOSE, BUN, CREATININE, CALCIUM , MG, PHOS in the last 168 hours. GFR: Estimated Creatinine Clearance: 82.6 mL/min (by C-G formula based on SCr of 0.68 mg/dL). Liver Function Tests: No  results for input(s): AST, ALT, ALKPHOS, BILITOT, PROT, ALBUMIN in the last 168 hours. CBG: Recent Labs  Lab 06/13/24 1605 06/13/24 2009  GLUCAP 166* 149*    No results found for this or any previous visit (from the past 240 hours).    Radiology Studies: No results found.  Scheduled Meds:  apixaban   5 mg Oral BID   Chlorhexidine  Gluconate Cloth  6 each Topical Daily   collagenase    Topical Daily   dexamethasone   4 mg Oral Daily   DULoxetine   60 mg Oral Daily   fluticasone  furoate-vilanterol  1 puff Inhalation Daily   levETIRAcetam   500 mg Oral BID   metoprolol  tartrate  12.5 mg Oral BID   polyethylene glycol  17 g Oral BID   pregabalin   150 mg Oral TID   senna-docusate  1 tablet Oral QHS   tiZANidine   2 mg Oral TID   Continuous Infusions:   LOS: 10 days  MDM: Patient is high risk for one or more organ failure.  They necessitate ongoing hospitalization for continued IV therapies and subsequent lab monitoring. Total time spent interpreting labs and vitals, reviewing the medical record, coordinating care amongst consultants and care team members, directly assessing and discussing care with  the patient and/or family: 4 min  Lorane Poland, DO [facility]  To contact the attending physician between 7A-7P please use Epic Chat. To contact the covering physician during after hours 7P-7A, please review Amion.  06/20/2024, 12:54 PM   *This document has been created with the assistance of dictation software. Please excuse typographical errors. *

## 2024-06-20 NOTE — Progress Notes (Signed)
 [facility] [address] [employer]  Hospice hospital liaison note:  Liaisons have been following patient for outpatient palliative care in the skilled nursing facility.   Asked today to re-evaluate patient for the hospice home. Chart is under review and hospice home appropriateness is currently Pending.   Hospice home does not however have a bed to offer patient today.  Liaison will follow up today/tomorrow if a bed becomes available.   Thank you for the opportunity to participate in this patient's care Hunter Seip, BSN, [facility] liaison [phone]

## 2024-06-20 NOTE — Plan of Care (Signed)
   Problem: Clinical Measurements: Goal: Will remain free from infection Outcome: Progressing Goal: Cardiovascular complication will be avoided Outcome: Progressing

## 2024-06-21 DIAGNOSIS — R531 Weakness: Secondary | ICD-10-CM | POA: Diagnosis not present

## 2024-06-21 NOTE — Plan of Care (Signed)
  Problem: Education: Goal: Knowledge of General Education information will improve Description: Including pain rating scale, medication(s)/side effects and non-pharmacologic comfort measures 06/21/2024 1955 by Dorien Jeffry LABOR, RN Outcome: Progressing 06/21/2024 1954 by Dorien Jeffry LABOR, RN Outcome: Progressing   Problem: Health Behavior/Discharge Planning: Goal: Ability to manage health-related needs will improve 06/21/2024 1955 by Dorien Jeffry LABOR, RN Outcome: Progressing 06/21/2024 1954 by Dorien Jeffry LABOR, RN Outcome: Progressing   Problem: Clinical Measurements: Goal: Ability to maintain clinical measurements within normal limits will improve 06/21/2024 1955 by Dorien Jeffry LABOR, RN Outcome: Progressing 06/21/2024 1954 by Dorien Jeffry LABOR, RN Outcome: Progressing Goal: Will remain free from infection 06/21/2024 1955 by Dorien Jeffry LABOR, RN Outcome: Progressing 06/21/2024 1954 by Dorien Jeffry LABOR, RN Outcome: Progressing Goal: Diagnostic test results will improve 06/21/2024 1955 by Dorien Jeffry LABOR, RN Outcome: Progressing 06/21/2024 1954 by Dorien Jeffry LABOR, RN Outcome: Progressing Goal: Respiratory complications will improve 06/21/2024 1955 by Dorien Jeffry LABOR, RN Outcome: Progressing 06/21/2024 1954 by Dorien Jeffry LABOR, RN Outcome: Progressing Goal: Cardiovascular complication will be avoided 06/21/2024 1955 by Dorien Jeffry LABOR, RN Outcome: Progressing 06/21/2024 1954 by Dorien Jeffry LABOR, RN Outcome: Progressing   Problem: Activity: Goal: Risk for activity intolerance will decrease 06/21/2024 1955 by Dorien Jeffry LABOR, RN Outcome: Progressing 06/21/2024 1954 by Dorien Jeffry LABOR, RN Outcome: Progressing   Problem: Nutrition: Goal: Adequate nutrition will be maintained 06/21/2024 1955 by Dorien Jeffry LABOR, RN Outcome: Progressing 06/21/2024 1954 by Dorien Jeffry LABOR, RN Outcome: Progressing   Problem:  Coping: Goal: Level of anxiety will decrease 06/21/2024 1955 by Dorien Jeffry LABOR, RN Outcome: Progressing 06/21/2024 1954 by Dorien Jeffry LABOR, RN Outcome: Progressing   Problem: Elimination: Goal: Will not experience complications related to bowel motility 06/21/2024 1955 by Dorien Jeffry LABOR, RN Outcome: Progressing 06/21/2024 1954 by Dorien Jeffry LABOR, RN Outcome: Progressing Goal: Will not experience complications related to urinary retention 06/21/2024 1955 by Dorien Jeffry LABOR, RN Outcome: Progressing 06/21/2024 1954 by Dorien Jeffry LABOR, RN Outcome: Progressing   Problem: Pain Managment: Goal: General experience of comfort will improve and/or be controlled 06/21/2024 1955 by Dorien Jeffry LABOR, RN Outcome: Progressing 06/21/2024 1954 by Dorien Jeffry LABOR, RN Outcome: Progressing   Problem: Safety: Goal: Ability to remain free from injury will improve 06/21/2024 1955 by Dorien Jeffry LABOR, RN Outcome: Progressing 06/21/2024 1954 by Dorien Jeffry LABOR, RN Outcome: Progressing   Problem: Skin Integrity: Goal: Risk for impaired skin integrity will decrease 06/21/2024 1955 by Dorien Jeffry LABOR, RN Outcome: Progressing 06/21/2024 1954 by Dorien Jeffry LABOR, RN Outcome: Progressing

## 2024-06-21 NOTE — Plan of Care (Signed)

## 2024-06-21 NOTE — Progress Notes (Signed)
 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 Patient had long and complicated admission 10/6 through 11/11 due to leptomeningeal carcinomatosis and bilateral lower extremity pain.  He is status post craniotomy with brain tumor resection.  He is also status post palliative radiation, not currently on chemotherapy.  He was discharged to CIR on 10/23 and then home on 11/11. Since arriving home patient has had progressive weakness and is now unable to walk.  He suffered a fall on 11/12.  He was also found to be retaining urine and Foley catheter was placed. Palliative care and prior attending have had ongoing goals of care conversations with the patient's wife and family and they have decided to pursue outpatient hospice.  The patient's wife is unable to care for him at home and [facility] is working on arranging outpatient placement.  On 11/27 he was made comfort measures only.   Subjective: No acute events overnight.  Patient is resting easily on my evaluation today.  Bedside RN reports no needs  Objective: Vitals:   06/19/24 0803 06/20/24 0817 06/20/24 1951 06/21/24 1453  BP: (!) 118/90 (!) 147/98 (!) 135/105 (!) 126/91  Pulse: (!) 112 (!) 121 (!) 105 96  Resp: 18 18 18 18   Temp: (!) 97.4 F (36.3 C) 97.7 F (36.5 C) 97.8 F (36.6 C) 98.9 F (37.2 C)  TempSrc:  Oral Oral   SpO2: 93% 91% 92% 92%  Weight:      Height:        Intake/Output Summary (Last 24 hours) at 06/21/2024 1641 Last data filed at 06/21/2024 1025 Gross per 24 hour  Intake --  Output 350 ml  Net -350  ml   Filed Weights   06/16/24 0530 06/17/24 0500 06/18/24 0500  Weight: 75.9 kg 74.7 kg 70.9 kg    Examination: General exam: Appears calm and comfortable, NAD  Respiratory system: No work of breathing, symmetric chest wall expansion Cardiovascular system: S1 & S2 heard, RRR.  Gastrointestinal system: Abdomen is nondistended, soft and nontender.  Neuro: Sleeping easily  Assessment & Plan:  Principal Problem:   Generalized weakness Active Problems:   Fall at home, initial encounter   Acute urinary retention   Benign hypertension   Chronic systolic CHF (congestive heart failure) (HCC)   COPD (chronic obstructive pulmonary disease) (HCC)   HLD (hyperlipidemia)   Hypokalemia   Chronic kidney disease, stage 3a (HCC)   Thrombocytopenia   Malignant neoplasm of upper lobe of left lung (HCC)   History of DVT (deep vein thrombosis)   Malignant neoplasm metastatic to brain [facility])   Palliative care encounter   Pressure injury of skin    Comfort measures only - qshift only vitals, turn to follow alarms, discontinue all lab draws, discontinue all medications that do not immediately provide the patient comfort - Pain, and anxiety medications remain available as needed.  Titrate needs daily - He does appear to have ongoing IV medication needs for pain and agitation.  Also has persistent tachycardia.  Given high symptom burden patient may benefit from inpatient hospice placement. - Awaiting hospice home decision.  TOC and [facility] liaison  following  Generalized weakness Recurrent falls - Unable to perform ADLs at home.  Recently discharged from CIR, subsequently suffered a fall when he got home - Falls and generalized weakness currently complicated by metastatic spread to brain. 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  Status post palliative  radiation therapy - Not currently on chemo - Continue Decadron  and Keppra  - Patient pending outpatient hospice care  Acute urinary retention - Continue Foley catheter for comfort  Hypertension - Continue current medications   Chronic systolic CHF - August 2025 EF 54% - Consider as needed furosemide for symptomatic support  COPD not currently in exacerbation - Continue home dose bronchodilators  Hyperlipidemia -Discontinue statin in light of comfort measures  Hypokalemia -Has been supplemented.  Have discontinued lab draws  CKD stage IIIa, ruled out CKD stage II more appropriate - GFR remains above 60  History of DVT - Continue Eliquis  for now  Thrombocytopenia - Stop lab trending  Body mass index is 24.48 kg/m. - Outpatient follow up for lifestyle modification and risk factor management   DVT prophylaxis: Eliquis    Code Status: Do not attempt resuscitation (DNR) - Comfort care Disposition:  Pending outpatient arrangements, long-term care with hospice versus inpatient hospice.  Consultants:  Treatment Team:  Consulting Physician: Roger Manna, MD  Procedures:    Antimicrobials:  Anti-infectives (From admission, onward)    Start     Dose/Rate Route Frequency Ordered Stop   06/09/24 2130  cefTRIAXone  (ROCEPHIN ) 2 g in sodium chloride  0.9 % 100 mL IVPB        2 g 200 mL/hr over 30 Minutes Intravenous  Once 06/09/24 2120 06/09/24 2300       Data Reviewed: I have personally reviewed following labs and imaging studies CBC: No results for input(s): WBC, NEUTROABS, HGB, HCT, MCV, PLT in the last 168 hours. Basic Metabolic Panel: No results for input(s): NA, K, CL, CO2, GLUCOSE, BUN, CREATININE, CALCIUM , MG, PHOS in the last 168 hours. GFR: Estimated Creatinine Clearance: 82.6 mL/min (by C-G formula based on SCr of 0.68 mg/dL). Liver Function Tests: No results for input(s): AST, ALT, ALKPHOS, BILITOT, PROT,  ALBUMIN in the last 168 hours. CBG: No results for input(s): GLUCAP in the last 168 hours.   No results found for this or any previous visit (from the past 240 hours).    Radiology Studies: No results found.  Scheduled Meds:  apixaban   5 mg Oral BID   Chlorhexidine  Gluconate Cloth  6 each Topical Daily   collagenase    Topical Daily   dexamethasone   4 mg Oral Daily   DULoxetine   60 mg Oral Daily   fluticasone  furoate-vilanterol  1 puff Inhalation Daily   levETIRAcetam   500 mg Oral BID   metoprolol  tartrate  12.5 mg Oral BID   polyethylene glycol  17 g Oral BID   pregabalin   150 mg Oral TID   senna-docusate  1 tablet Oral QHS   tiZANidine   2 mg Oral TID   Continuous Infusions:   LOS: 11 days  MDM: Patient is high risk for one or more organ failure.  They necessitate ongoing hospitalization for continued IV therapies and subsequent lab monitoring. Total time spent interpreting labs and vitals, reviewing the medical record, coordinating care amongst consultants and care team members, directly assessing and discussing care with the patient and/or family: 55 min  Juli Odom, DO [facility]  To contact the attending physician between 7A-7P please use Epic Chat. To contact the covering physician during after hours 7P-7A, please review Amion.  06/21/2024, 4:41 PM   *This document has been created with the assistance of dictation software. Please excuse typographical errors. *

## 2024-06-22 DIAGNOSIS — R531 Weakness: Secondary | ICD-10-CM | POA: Diagnosis not present

## 2024-06-22 NOTE — Progress Notes (Addendum)
 [facility] Liaison Note  Asked to re-evaluate patient for hospice [facility] Patient is awake, alert and interactive.  Patient continues to take meds by mouth.  Patient's symptoms managed by PO oxycodone .  Patient does not meet criteria for hospice IPU at this time. Hospital care team notified including Dr. Leesa.  Thank you for allowing participation in this patient's care.  Saddie HILARIO Na, RN Nurse Liaison [phone]

## 2024-06-22 NOTE — Plan of Care (Signed)

## 2024-06-22 NOTE — Progress Notes (Signed)
 [facility] Liaison Note   Follow up visit made.  Patient lying in bed with eyes closed.  Awakes to verbal stimuli.  Patient with dry mouth and asked for water.  Assisted patient to set up in bed with another RN.  Patient able to hold cup and drink water from a straw.  Patient has received 1 dose of IV morphine  today for pain.  Patient's voice scratchy.  Patient has refused his morning po meds today.    Patient does not meet criteria for hospice IPU at this time. Will continue to follow patient through hospitalization.   Thank you for allowing participation in this patient's care.   Saddie HILARIO Na, RN Nurse Liaison [phone]

## 2024-06-22 NOTE — Progress Notes (Signed)
 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 Patient had long and complicated admission 10/6 through 11/11 due to leptomeningeal carcinomatosis and bilateral lower extremity pain.  He is status post craniotomy with brain tumor resection.  He is also status post palliative radiation, not currently on chemotherapy.  He was discharged to CIR on 10/23 and then home on 11/11. Since arriving home patient has had progressive weakness and is now unable to walk.  He suffered a fall on 11/12.  He was also found to be retaining urine and Foley catheter was placed. Palliative care and prior attending have had ongoing goals of care conversations with the patient's wife and family and they have decided to pursue outpatient hospice.  The patient's wife is unable to care for him at home and [facility] is working on arranging outpatient placement.  On 11/27 he was made comfort measures only.  Subjective: No acute events overnight.  Patient resting easily in bed today.  No family at bedside  Objective: Vitals:   06/20/24 1951 06/21/24 1453 06/21/24 1955 06/22/24 0758  BP: (!) 135/105 (!) 126/91 112/85 (!) 142/108  Pulse: (!) 105 96 98 (!) 108  Resp: 18 18 18 15   Temp: 97.8 F (36.6 C) 98.9 F (37.2 C) (!) 97.5 F (36.4 C) 98.7 F (37.1 C)  TempSrc: Oral  Oral   SpO2: 92% 92% 95% 95%  Weight:      Height:       No intake or output data in the 24 hours ending 06/22/24 1427  Filed Weights   06/16/24 0530 06/17/24 0500 06/18/24 0500  Weight: 75.9 kg 74.7 kg 70.9 kg     Examination: General exam: Appears calm and comfortable, NAD  Respiratory system: No work of breathing, symmetric chest wall expansion Cardiovascular system: S1 & S2 heard, RRR.  Gastrointestinal system: Abdomen is nondistended, soft and nontender.  Neuro: Sleeping easily  Assessment & Plan:  Principal Problem:   Generalized weakness Active Problems:   Fall at home, initial encounter   Acute urinary retention   Benign hypertension   Chronic systolic CHF (congestive heart failure) (HCC)   COPD (chronic obstructive pulmonary disease) (HCC)   HLD (hyperlipidemia)   Hypokalemia   Chronic kidney disease, stage 3a (HCC)   Thrombocytopenia   Malignant neoplasm of upper lobe of left lung (HCC)   History of DVT (deep vein thrombosis)   Malignant neoplasm metastatic to brain [facility])   Palliative care encounter   Pressure injury of skin    Comfort measures only - qshift only vitals, turn to follow alarms, discontinue all lab draws, discontinue all medications that do not immediately provide the patient comfort - Pain, and anxiety medications remain available as needed.  Titrate needs daily - He does appear to have ongoing IV medication needs for pain and agitation.  Also has persistent tachycardia.  Persistent high symptom burden patient would benefit from inpatient hospice. - Awaiting hospice home decision.  TOC and [facility] liaison following  Generalized weakness Recurrent falls - Unable to perform ADLs at home.  Recently discharged from CIR, subsequently suffered a fall when he got home -  Falls and generalized weakness currently complicated by metastatic spread to brain. 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  Status post palliative radiation therapy - Not currently on chemo - Continue Decadron  and Keppra  - Patient pending outpatient  hospice care  Acute urinary retention - Continue Foley catheter for comfort  Hypertension - Continue current medications   Chronic systolic CHF - August 2025 EF 54% - Consider as needed furosemide for symptomatic support  COPD not currently in exacerbation - Continue home dose bronchodilators  Hyperlipidemia -Discontinue statin in light of comfort measures  Hypokalemia -Has been supplemented.  Have discontinued lab draws  CKD stage IIIa, ruled out CKD stage II more appropriate - GFR remains above 60  History of DVT - Continue Eliquis  for now  Thrombocytopenia - Stop lab trending  Body mass index is 24.48 kg/m. - Outpatient follow up for lifestyle modification and risk factor management   DVT prophylaxis: Eliquis    Code Status: Do not attempt resuscitation (DNR) - Comfort care Disposition:  Pending outpatient arrangements, long-term care with hospice versus inpatient hospice.  Consultants:  Treatment Team:  Consulting Physician: Parris Manna, MD  Procedures:    Antimicrobials:  Anti-infectives (From admission, onward)    Start     Dose/Rate Route Frequency Ordered Stop   06/09/24 2130  cefTRIAXone  (ROCEPHIN ) 2 g in sodium chloride  0.9 % 100 mL IVPB        2 g 200 mL/hr over 30 Minutes Intravenous  Once 06/09/24 2120 06/09/24 2300       Data Reviewed: I have personally reviewed following labs and imaging studies CBC: No results for input(s): WBC, NEUTROABS, HGB, HCT, MCV, PLT in the last 168 hours. Basic Metabolic Panel: No results for input(s): NA, K, CL, CO2, GLUCOSE, BUN, CREATININE, CALCIUM , MG, PHOS in the last 168 hours. GFR: Estimated Creatinine Clearance: 82.6 mL/min (by C-G formula based on SCr of 0.68 mg/dL). Liver Function Tests: No results for input(s): AST, ALT, ALKPHOS, BILITOT, PROT, ALBUMIN in the last 168 hours. CBG: No results for input(s): GLUCAP in the last 168 hours.   No results  found for this or any previous visit (from the past 240 hours).    Radiology Studies: No results found.  Scheduled Meds:  apixaban   5 mg Oral BID   Chlorhexidine  Gluconate Cloth  6 each Topical Daily   collagenase    Topical Daily   dexamethasone   4 mg Oral Daily   DULoxetine   60 mg Oral Daily   fluticasone  furoate-vilanterol  1 puff Inhalation Daily   levETIRAcetam   500 mg Oral BID   metoprolol  tartrate  12.5 mg Oral BID   polyethylene glycol  17 g Oral BID   pregabalin   150 mg Oral TID   senna-docusate  1 tablet Oral QHS   tiZANidine   2 mg Oral TID   Continuous Infusions:   LOS: 12 days  MDM: Patient is high risk for one or more organ failure.  They necessitate ongoing hospitalization for continued IV therapies and subsequent lab monitoring. Total time spent interpreting labs and vitals, reviewing the medical record, coordinating care amongst consultants and care team members, directly assessing and discussing care with the patient and/or family: 55 min  Sussie Minor, DO [facility]  To contact the attending physician between 7A-7P please use Epic Chat. To contact the covering physician during after hours 7P-7A, please review Amion.  06/22/2024, 2:27 PM   *  This document has been created with the assistance of dictation software. Please excuse typographical errors. *

## 2024-06-23 ENCOUNTER — Inpatient Hospital Stay: Admitting: Hospice and Palliative Medicine

## 2024-06-23 ENCOUNTER — Ambulatory Visit: Admitting: Radiation Oncology

## 2024-06-23 DIAGNOSIS — R531 Weakness: Secondary | ICD-10-CM | POA: Diagnosis not present

## 2024-06-23 NOTE — Progress Notes (Incomplete)
  Radiation Oncology         [phone]) [phone] ________________________________  Name: Roger Black MRN: [id number]  Date of Service: 06/30/2024  DOB: 1955/11/09  Post Treatment Telephone Note  Diagnosis:  Recurrent Metastatic Stage IA2, cT1bN0M0, NSCLC, of the LUL with brain and leptomeningeal disease.       05/15/24-05/28/24 The level of the lumbar spine at the [place] was treated to 30 Gy in 10 fractions with 3D planning technology    05/21/24-05/26/24 The following sites were treated with SRT IMRT technique with ExactTrac technology to 9 Gy to the following targets in 3 fractions PTV_1_CerebellumL_57mm PTV_2_FrontalR_57mm  The patient {WAS/WAS NOT:[phone]::was not} available for call today.  The patient {Desc; did/not:[id number]}note fatigue during radiation. The patient {Desc; did/not:[id number]} note skin changes in the field of radiation during therapy. The patient {HAS HAS WNU:81165} noticed improvement in pain in the area(s) treated with radiation. The patient {ACTION; IS/IS WNU:[id number]} taking dexamethasone . The patient {DOES_DOES WNU:81435} have symptoms of  weakness or loss of control of the extremities. The patient {DOES_DOES WNU:81435} have symptoms of headache. The patient {DOES_DOES WNU:81435} have symptoms of seizure or uncontrolled movement. The patient {DOES_DOES WNU:81435} have symptoms of changes in vision. The patient {DOES_DOES WNU:81435} have changes in speech. The patient {DOES_DOES WNU:81435} have confusion.   The patient is scheduled for ongoing care with Dr. [name] in medical oncology. The patient was encouraged to call if he  develops concerns or questions regarding radiation.

## 2024-06-23 NOTE — Progress Notes (Signed)
 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 Patient had long and complicated admission 10/6 through 11/11 due to leptomeningeal carcinomatosis and bilateral lower extremity pain.  He is status post craniotomy with brain tumor resection.  He is also status post palliative radiation, not currently on chemotherapy.  He was discharged to [facility] on 10/23 and then home on 11/11. Since arriving home patient has had progressive weakness and is now unable to walk.  He suffered a fall on 11/12.  He was also found to be retaining urine and Foley catheter was placed. Palliative care and prior attending have had ongoing goals of care conversations with the patient's wife and family and they have decided to pursue outpatient hospice.  The patient's wife is unable to care for him at home and [facility] is working on arranging outpatient placement.  On 11/27 he was made comfort measures only.  Subjective: No acute events overnight.  Patient is resting easily in bed.  No family present at bedside  Objective: Vitals:   06/21/24 1955 06/22/24 0758 06/22/24 1930 06/23/24 0819  BP: 112/85 (!) 142/108 (!) 146/108 111/67  Pulse: 98 (!) 108 (!) 110 (!) 126  Resp: 18 15 16 18   Temp: (!) 97.5 F (36.4 C) 98.7 F (37.1 C) (!) 97.5 F (36.4 C) 97.6 F (36.4 C)  TempSrc: Oral     SpO2: 95% 95% 95% 100%  Weight:      Height:        Intake/Output Summary (Last 24 hours) at 06/23/2024 1150 Last data filed at 06/23/2024 0900 Gross per 24 hour  Intake 0 ml  Output 750 ml  Net -750 ml    Filed Weights    06/16/24 0530 06/17/24 0500 06/18/24 0500  Weight: 75.9 kg 74.7 kg 70.9 kg    Examination: General exam: Appears calm and comfortable, NAD  Respiratory system: No work of breathing, symmetric chest wall expansion Cardiovascular system: S1 & S2 heard, RRR.  Gastrointestinal system: Abdomen is nondistended, soft and nontender.  Neuro: Sleeping easily  Assessment & Plan:  Principal Problem:   Generalized weakness Active Problems:   Fall at home, initial encounter   Acute urinary retention   Benign hypertension   Chronic systolic CHF (congestive heart failure) (HCC)   COPD (chronic obstructive pulmonary disease) (HCC)   HLD (hyperlipidemia)   Hypokalemia   Chronic kidney disease, stage 3a (HCC)   Thrombocytopenia   Malignant neoplasm of upper lobe of left lung (HCC)   History of DVT (deep vein thrombosis)   Malignant neoplasm metastatic to brain [facility])   Palliative care encounter   Pressure injury of skin    Comfort measures only - qshift only vitals, turn to follow alarms, discontinue all lab draws, discontinue all medications that do not immediately provide the patient comfort - Pain, and anxiety medications remain available as needed.  Titrate needs daily - He does appear to have ongoing IV medication needs for pain and agitation.  Also has persistent tachycardia.  Persistent high symptom burden patient would benefit from inpatient hospice.  Unfortunately he was determined not to be appropriate for [facility] care inpatient  unit at this time. - TOC reports patient does not have benefits for hospice services in a long-term care facility.  We will continue comfort measures in the hospital only until this can be sorted  Generalized weakness Recurrent falls - Unable to perform ADLs at home.  Recently discharged from [facility], subsequently suffered a fall when he got home - Falls and generalized weakness currently complicated by metastatic spread to brain. 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  Status post palliative radiation therapy - Not currently on chemo - Continue Decadron  and Keppra  - Patient pending outpatient hospice care  Acute urinary retention - Continue Foley catheter for comfort  Hypertension - Continue current medications   Chronic systolic CHF - August 2025 EF 54% - Consider as needed furosemide for symptomatic support  COPD not currently in exacerbation - Continue home dose bronchodilators  Hyperlipidemia -Discontinue statin in light of comfort measures  Hypokalemia -Has been supplemented.  Have discontinued lab draws  CKD stage IIIa, ruled out CKD stage II more appropriate - GFR remains above 60  History of DVT - Continue Eliquis  for now  Thrombocytopenia - Stop lab trending  Body mass index is 24.48 kg/m. - Outpatient follow up for lifestyle modification and risk factor management   DVT prophylaxis: Eliquis    Code Status: Do not attempt resuscitation (DNR) - Comfort care Disposition:  Pending outpatient arrangements, long-term care with hospice   Consultants:  Treatment Team:  Consulting Physician: Parris Manna, MD  Procedures:    Antimicrobials:  Anti-infectives (From admission, onward)    Start     Dose/Rate Route Frequency Ordered Stop   06/09/24 2130  cefTRIAXone  (ROCEPHIN ) 2 g in sodium chloride  0.9 % 100 mL IVPB        2 g 200 mL/hr over 30 Minutes Intravenous  Once 06/09/24 2120 06/09/24 2300       Data Reviewed: I have personally reviewed following labs and imaging studies CBC: No results for input(s): WBC, NEUTROABS, HGB, HCT, MCV, PLT in the last 168 hours. Basic Metabolic Panel: No results for input(s): NA, K, CL, CO2, GLUCOSE, BUN, CREATININE, CALCIUM , MG, PHOS in the last 168 hours. GFR: Estimated Creatinine Clearance: 82.6 mL/min (by C-G formula  based on SCr of 0.68 mg/dL). Liver Function Tests: No results for input(s): AST, ALT, ALKPHOS, BILITOT, PROT, ALBUMIN in the last 168 hours. CBG: No results for input(s): GLUCAP in the last 168 hours.   No results found for this or any previous visit (from the past 240 hours).    Radiology Studies: No results found.  Scheduled Meds:  apixaban   5 mg Oral BID   Chlorhexidine  Gluconate Cloth  6 each Topical Daily   collagenase    Topical Daily   dexamethasone   4 mg Oral Daily   DULoxetine   60 mg Oral Daily   fluticasone  furoate-vilanterol  1 puff Inhalation Daily   levETIRAcetam   500 mg Oral BID   metoprolol  tartrate  12.5 mg Oral BID   polyethylene glycol  17 g Oral BID   pregabalin   150 mg Oral TID   senna-docusate  1 tablet Oral QHS   tiZANidine   2 mg Oral TID   Continuous Infusions:   LOS: 13 days  MDM: Patient is high risk for one or more organ failure.  They necessitate ongoing hospitalization for continued IV therapies and subsequent lab monitoring. Total time spent interpreting labs and vitals, reviewing the medical record,  coordinating care amongst consultants and care team members, directly assessing and discussing care with the patient and/or family: 55 min  Brody Kump, DO [facility]  To contact the attending physician between 7A-7P please use Epic Chat. To contact the covering physician during after hours 7P-7A, please review Amion.  06/23/2024, 11:50 AM   *This document has been created with the assistance of dictation software. Please excuse typographical errors. *

## 2024-06-23 NOTE — TOC Progression Note (Signed)
 Transition of Care [facility]) - Progression Note    Patient Details  Name: Roger Black MRN: [id number] Date of Birth: [birth year]  [facility]) CM/SW Contact  Dalia GORMAN Fuse, RN Phone Number: 06/23/2024, 11:28 AM  Clinical Narrative:    HTA denied SNF and he is not appropriate for [facility] IPU at this time.                      Expected Discharge Plan and Services                                               Social Drivers of Health (SDOH) Interventions SDOH Screenings   Food Insecurity: Patient Declined (06/10/2024)  Housing: Patient Declined (06/10/2024)  Recent Concern: Housing - High Risk (05/07/2024)  Transportation Needs: No Transportation Needs (06/10/2024)  Utilities: Patient Declined (06/10/2024)  Financial Resource Strain: Low Risk  (03/07/2024)   Received from [facility]  Recent Concern: Financial Resource Strain - High Risk (01/29/2024)   Received from [facility]  Physical Activity: Inactive (08/07/2023)   Received from [facility]  Social Connections: Patient Declined (06/10/2024)  Stress: Stress Concern Present (08/07/2023)   Received from [facility]  Tobacco Use: Medium Risk (06/09/2024)  Health Literacy: Adequate Health Literacy (08/07/2023)   Received from [facility]    Readmission Risk Interventions     No data to display

## 2024-06-23 NOTE — Progress Notes (Signed)
 Comfort care rounds completed with Roger Black, BMT. Patient cleansed, lotion, powder and deodorant applied. Mouth swabbed and moisturized. Patient noted to be vocalizing pain in response to physical touch and movement. Bedside RN made aware.

## 2024-06-24 DIAGNOSIS — R531 Weakness: Secondary | ICD-10-CM | POA: Diagnosis not present

## 2024-06-24 NOTE — Progress Notes (Signed)
 Comfort care rounds completed with [name], BMT. Patient cleansed, linen changed and repositioned. Patient lethargic, non-interactive with no oral intake. Bedside RN made aware of pain vocalizations and need for seceretion management. Medications given per order.  Patient to be re-evaluated for IPU.

## 2024-06-24 NOTE — Progress Notes (Signed)
 [facility] Room 118 [facility] Liaison  Reevaluating patient for the Hospice home due to a decline inpatient's condition.  HL left voice mail messages on spouse's home and mobile phone.  Also spoke with patient's son, who will attempt to reach spouse.    Please call with any hospice related questions or concerns.  Thank you for the opportunity to participate in this patient's care.  [facility] Liaison [phone]

## 2024-06-24 NOTE — TOC Progression Note (Signed)
 Transition of Care [facility]) - Progression Note    Patient Details  Name: Roger Black MRN: [id number] Date of Birth: April 07, 1956  [facility]) CM/SW Contact  Dalia GORMAN Fuse, RN Phone Number: 06/24/2024, 1:25 PM  Clinical Narrative:    Late entry 06/23/24 1008:  HTA denied SNF.  TOC placed call to the patient's wife, [name], and LVMM requesting callback.   12/2 1325:TOC placed call to the patient's wife, [name], and LVMM requesting callback. Patient is not eating. [facility] to complete eval to see if the patient is appropriate for IPU.                     Expected Discharge Plan and Services                                               Social Drivers of Health (SDOH) Interventions SDOH Screenings   Food Insecurity: Patient Declined (06/10/2024)  Housing: Patient Declined (06/10/2024)  Recent Concern: Housing - High Risk (05/07/2024)  Transportation Needs: No Transportation Needs (06/10/2024)  Utilities: Patient Declined (06/10/2024)  Financial Resource Strain: Low Risk  (03/07/2024)   Received from [facility]  Recent Concern: Financial Resource Strain - High Risk (01/29/2024)   Received from [facility]  Physical Activity: Inactive (08/07/2023)   Received from [facility]  Social Connections: Patient Declined (06/10/2024)  Stress: Stress Concern Present (08/07/2023)   Received from [facility]  Tobacco Use: Medium Risk (06/09/2024)  Health Literacy: Adequate Health Literacy (08/07/2023)   Received from [facility]    Readmission Risk Interventions     No data to display

## 2024-06-24 NOTE — Discharge Summary (Signed)
 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 Patient had long and complicated admission 10/6 through 11/11 due to leptomeningeal carcinomatosis and bilateral lower extremity pain.  He is status post craniotomy with brain tumor resection.  He is also status post palliative radiation, not currently on chemotherapy.  He was discharged to CIR on 10/23 and then home on 11/11. Since arriving home patient had progressive weakness and is now unable to walk.  He suffered a fall on 11/12.  He was also found to be retaining urine and Foley catheter was placed. Not long after arrival the patient's wife and family have opted to proceed with comfort measures only.  Discharge was delayed pending outpatient hospice arrangements. Today patient is discharging directly to hospice home.  Comfort measures only - No longer accepting p.o. medications.  Have discontinued all home medications.  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 Acute urinary retention, with Foley catheter in place Hypertension Chronic systolic CHF COPD, not currently in exacerbation Hyperlipidemia Hypokalemia CKD stage II History of DVT Thrombocytopenia  Discharge Instructions  Discharge Instructions     Call MD for:  difficulty breathing, headache or visual disturbances   Complete by: As directed    Call MD for:  persistant dizziness or light-headedness   Complete by: As  directed    Call MD for:  persistant nausea and vomiting   Complete by: As directed    Call MD for:  severe uncontrolled pain   Complete by: As directed    Call MD for:  temperature >100.4   Complete by: As directed    Diet general   Complete by: As directed    Discharge wound care:   Complete by: As directed    Cleanse coccyx wound with Vashe wound cleanser, do not rinse.  Apply 1/4 thick layer of Santyl  to wound bed daily and  top with saline moist gauze.  Cover with dry gauze and silicone foam      Allergies as of 06/24/2024   No Known Allergies      Medication List     STOP taking these medications    atorvastatin  80 MG tablet Commonly known as: LIPITOR    Baclofen  5 MG Tabs   budesonide-formoterol 160-4.5 MCG/ACT inhaler Commonly known as: SYMBICORT   clonazePAM  0.5 MG tablet Commonly known as: KLONOPIN    dexamethasone  2 MG tablet Commonly known as: DECADRON    DULoxetine  60 MG capsule Commonly known as: CYMBALTA    ipratropium 0.03 % nasal spray Commonly known as: ATROVENT    levETIRAcetam  500 MG tablet Commonly known as: KEPPRA    montelukast  10 MG tablet Commonly known as: SINGULAIR    naloxone  4 MG/0.1ML Liqd nasal spray kit Commonly known as: NARCAN    Oxycodone  HCl 10 MG Tabs   pantoprazole  40 MG tablet Commonly known as: PROTONIX    polyethylene glycol powder 17 GM/SCOOP powder Commonly known as: GLYCOLAX /MIRALAX    pregabalin  150 MG capsule Commonly known as: LYRICA    Stool Softener/Laxative 50-8.6 MG tablet Generic drug: senna-docusate   tamsulosin  0.4 MG Caps capsule Commonly known as:  FLOMAX    tiZANidine  2 MG tablet Commonly known as: ZANAFLEX    traZODone  50 MG tablet Commonly known as: DESYREL    vitamin D3 25 MCG tablet Commonly known as: CHOLECALCIFEROL        TAKE these medications    Acetaminophen  Extra Strength 500 MG Tabs Take 2 tablets (1,000 mg total) by mouth 3 (three) times daily.   albuterol  108 (90 Base)  MCG/ACT inhaler Commonly known as: VENTOLIN  HFA Inhale 2 puffs into the lungs every 4 (four) hours as needed for shortness of breath.   Eliquis  5 MG Tabs tablet Generic drug: apixaban  Take two pills twice a day through 11/15. Starting 11/16 am, decrease to one pill twice a day.               Discharge Care Instructions  (From admission, onward)           Start     Ordered   06/24/24 0000  Discharge wound care:       Comments: Cleanse coccyx wound with Vashe wound cleanser, do not rinse.  Apply 1/4 thick layer of Santyl  to wound bed daily and  top with saline moist gauze.  Cover with dry gauze and silicone foam   06/24/24 1452            Contact information for follow-up providers     [name], MD Follow up.   Specialty: Internal Medicine Why: hospital follow up Contact information: [address] [facility] [name] [address] [phone]              Contact information for after-discharge care     Destination     [facility] .   Service: Skilled Nursing Contact information: [address] [place]  [address] [phone]                    No Known Allergies  Consultations: Treatment Team:  Aleskerov, Fuad, MD   Procedures/Studies: CT HEAD WO CONTRAST ( ) Result Date: 06/09/2024 EXAM: CT HEAD WITHOUT CONTRAST 06/09/2024 11:23:59 PM TECHNIQUE: CT of the head was performed without the administration of intravenous contrast. Automated exposure control, iterative reconstruction, and/or weight based adjustment of the mA/kV was utilized to reduce the radiation dose to as low as reasonably achievable. COMPARISON: CT head 05/09/2024, mri head 05/18/24 CLINICAL HISTORY: Head trauma, minor (Age >= 65y) FINDINGS: BRAIN AND VENTRICLES: No acute hemorrhage. No evidence of acute infarct. No hydrocephalus. No extra-axial collection. No mass effect or midline shift. Patchy and confluent areas of  decreased attenuation are noted throughout the deep and periventricular white matter of the cerebral hemispheres bilaterally suggestive of chronic microvascular ischemic changes. Similar appearing bilateral frontal and left cerebelalr vasogenic edema. Prior right craniotomy. ORBITS: No acute abnormality. SINUSES: No acute abnormality. SOFT TISSUES AND SKULL: No acute soft tissue abnormality. No skull fracture. IMPRESSION: 1. No acute intracranial abnormality. 2. Similar appearing bilateral frontal and left cerebelalr vasogenic edema. Electronically signed by: Morgane Naveau MD 06/09/2024 11:35 PM EST RP Workstation: [id number]   DG Hip Unilat W or Wo Pelvis 2-3 Views Right Result Date: 06/09/2024 CLINICAL DATA:  Fall with hip pain. EXAM: DG HIP (WITH OR WITHOUT PELVIS) 2-3V RIGHT COMPARISON:  None. FINDINGS: There is no evidence of hip fracture or dislocation. There is no evidence of arthropathy or other focal bone abnormality. IMPRESSION: Negative. Electronically Signed   By: Greig Pique M.D.   On: 06/09/2024 19:43   VAS US  LOWER EXTREMITY VENOUS (DVT)  Result Date: 06/01/2024  Lower Venous DVT Study Patient Name:  RUBENS CRANSTON [facility]  Date of Exam:   05/31/2024 Medical Rec #: [id number]             Accession #:    [id number] Date of Birth: 10-16-1955             Patient Gender: M Patient Age:   40 years Exam Location:  [facility] Procedure:      VAS US  LOWER EXTREMITY VENOUS (DVT) Referring Phys: SVEN ELKS --------------------------------------------------------------------------------  Indications: Swelling.  Comparison Study: Prior negative bilateral LEV done 02/13/24 Performing Technologist: Alberta Lis RVS  Examination Guidelines: A complete evaluation includes B-mode imaging, spectral Doppler, color Doppler, and power Doppler as needed of all accessible portions of each vessel. Bilateral testing is considered an integral part of a complete examination. Limited examinations for  reoccurring indications may be performed as noted. The reflux portion of the exam is performed with the patient in reverse Trendelenburg.  +-----+---------------+---------+-----------+----------+--------------+ RIGHTCompressibilityPhasicitySpontaneityPropertiesThrombus Aging +-----+---------------+---------+-----------+----------+--------------+ CFV  Full           Yes      Yes                                 +-----+---------------+---------+-----------+----------+--------------+ SFJ  Full                                                        +-----+---------------+---------+-----------+----------+--------------+   +---------+---------------+---------+-----------+----------+-----------------+ LEFT     CompressibilityPhasicitySpontaneityPropertiesThrombus Aging    +---------+---------------+---------+-----------+----------+-----------------+ CFV      Full           Yes      Yes                                    +---------+---------------+---------+-----------+----------+-----------------+ SFJ      Full                                                           +---------+---------------+---------+-----------+----------+-----------------+ FV Prox  Full           Yes      No                                     +---------+---------------+---------+-----------+----------+-----------------+ FV Mid   Partial        Yes      No                   Age Indeterminate +---------+---------------+---------+-----------+----------+-----------------+ FV DistalPartial                                      Age Indeterminate +---------+---------------+---------+-----------+----------+-----------------+ PFV      Full           Yes      No                                     +---------+---------------+---------+-----------+----------+-----------------+  POP      Full           Yes      Yes                                     +---------+---------------+---------+-----------+----------+-----------------+ PTV      Partial                                      Age Indeterminate +---------+---------------+---------+-----------+----------+-----------------+ PERO     Partial                                      Age Indeterminate +---------+---------------+---------+-----------+----------+-----------------+ Gastroc  Full                                                           +---------+---------------+---------+-----------+----------+-----------------+     Summary: RIGHT: - No evidence of common femoral vein obstruction.   LEFT: - Findings consistent with age indeterminate deep vein thrombosis involving the left femoral vein, left posterior tibial veins, and left peroneal veins.  - No cystic structure found in the popliteal fossa.  *See table(s) above for measurements and observations. Electronically signed by Debby Robertson on 06/01/2024 at 2:12:59 PM.    Final       Discharge Exam: Vitals:   06/24/24 0040 06/24/24 0824  BP:  (!) 135/100  Pulse: 99 (!) 38  Resp: 18 (!) 22  Temp:  (!) 97.5 F (36.4 C)  SpO2:  100%   Vitals:   06/23/24 0819 06/24/24 0038 06/24/24 0040 06/24/24 0824  BP: 111/67 103/84  (!) 135/100  Pulse: (!) 126 (!) 125 99 (!) 38  Resp: 18 (!) 24 18 (!) 22  Temp: 97.6 F (36.4 C) 98.4 F (36.9 C)  (!) 97.5 F (36.4 C)  TempSrc:  Oral  Oral  SpO2: 100% 100%  100%  Weight:      Height:        General exam: Appears calm and comfortable, NAD  Respiratory system: No work of breathing, symmetric chest wall expansion Cardiovascular system: S1 & S2 heard, RRR.  Gastrointestinal system: Abdomen is nondistended, soft and nontender.  Neuro: Sleeping easily   The results of significant diagnostics from this hospitalization (including imaging, microbiology, ancillary and laboratory) are listed below for reference.     Microbiology: No results found for this or any previous visit  (from the past 240 hours).   Labs: BNP (last 3 results) Recent Labs    04/28/24 1303  BNP 7.4   Basic Metabolic Panel: No results for input(s): NA, K, CL, CO2, GLUCOSE, BUN, CREATININE, CALCIUM , MG, PHOS in the last 168 hours. Liver Function Tests: No results for input(s): AST, ALT, ALKPHOS, BILITOT, PROT, ALBUMIN in the last 168 hours. No results for input(s): LIPASE, AMYLASE in the last 168 hours. No results for input(s): AMMONIA in the last 168 hours. CBC: No results for input(s): WBC, NEUTROABS, HGB, HCT, MCV, PLT in the last 168 hours. Cardiac Enzymes: No results for input(s): CKTOTAL, CKMB, CKMBINDEX, TROPONINI in the last 168 hours. BNP:  Invalid input(s): POCBNP CBG: No results for input(s): GLUCAP in the last 168 hours. D-Dimer No results for input(s): DDIMER in the last 72 hours. Hgb A1c No results for input(s): HGBA1C in the last 72 hours. Lipid Profile No results for input(s): CHOL, HDL, LDLCALC, TRIG, CHOLHDL, LDLDIRECT in the last 72 hours. Thyroid  function studies No results for input(s): TSH, T4TOTAL, T3FREE, THYROIDAB in the last 72 hours.  Invalid input(s): FREET3 Anemia work up No results for input(s): VITAMINB12, FOLATE, FERRITIN, TIBC, IRON, RETICCTPCT in the last 72 hours. Urinalysis    Component Value Date/Time   COLORURINE YELLOW (A) 06/09/2024 2000   APPEARANCEUR CLOUDY (A) 06/09/2024 2000   LABSPEC 1.016 06/09/2024 2000   PHURINE 7.0 06/09/2024 2000   GLUCOSEU NEGATIVE 06/09/2024 2000   HGBUR SMALL (A) 06/09/2024 2000   BILIRUBINUR NEGATIVE 06/09/2024 2000   KETONESUR NEGATIVE 06/09/2024 2000   PROTEINUR NEGATIVE 06/09/2024 2000   NITRITE POSITIVE (A) 06/09/2024 2000   LEUKOCYTESUR NEGATIVE 06/09/2024 2000   Sepsis Labs No results for input(s): WBC in the last 168 hours.  Invalid input(s): PROCALCITONIN, LACTICIDVEN Microbiology No  results found for this or any previous visit (from the past 240 hours).   Time coordinating discharge: 32 min   SIGNED: Lyndal Alamillo, DO [facility] 06/24/2024, 2:52 PM Pager   If 7PM-7AM, please contact night-coverage

## 2024-06-24 NOTE — Plan of Care (Signed)
  Problem: Clinical Measurements: Goal: Ability to maintain clinical measurements within normal limits will improve Outcome: Progressing Goal: Respiratory complications will improve Outcome: Progressing   Problem: Nutrition: Goal: Adequate nutrition will be maintained Outcome: Progressing   Problem: Elimination: Goal: Will not experience complications related to urinary retention Outcome: Progressing

## 2024-06-30 ENCOUNTER — Ambulatory Visit

## 2024-07-21 ENCOUNTER — Encounter: Admitting: Physical Medicine and Rehabilitation

## 2024-07-24 NOTE — Progress Notes (Signed)
 Wife updated about pt.'s condition; she's on her way to come up see her husband.

## 2024-07-24 NOTE — Progress Notes (Signed)
   07/05/2024 0122  Spiritual Encounters  Type of Visit Initial  Care provided to: [facility] partners present during encounter Nurse  Referral source Nurse (RN/NT/LPN)  Reason for visit Patient death  OnCall Visit Yes  Interventions  Spiritual Care Interventions Made Established relationship of care and support;Compassionate presence;Reflective listening;Normalization of emotions;Prayer;Encouragement;Supported grief process  Spiritual Care Plan  Spiritual Care Issues Still Outstanding No further spiritual care needs at this time (see row info)

## 2024-07-24 NOTE — Progress Notes (Addendum)
 Pt expired at 1:37am, death pronounced by 2 RNs; wife arrived  at around 1:50 am; Consulting Civil Engineer ,Doc on Call & Yasmin ,RN were aware of the situation.

## 2024-07-24 DEATH — deceased
